# Patient Record
Sex: Male | Born: 1965
Health system: Southern US, Community
[De-identification: ages and names within clinical notes are randomized; demographics above are authoritative.]

## PROBLEM LIST (undated history)

## (undated) DIAGNOSIS — E7800 Pure hypercholesterolemia, unspecified: Secondary | ICD-10-CM

## (undated) DIAGNOSIS — I10 Essential (primary) hypertension: Secondary | ICD-10-CM

## (undated) DIAGNOSIS — S32009A Unspecified fracture of unspecified lumbar vertebra, initial encounter for closed fracture: Secondary | ICD-10-CM

## (undated) DIAGNOSIS — K219 Gastro-esophageal reflux disease without esophagitis: Secondary | ICD-10-CM

## (undated) DIAGNOSIS — J449 Chronic obstructive pulmonary disease, unspecified: Secondary | ICD-10-CM

## (undated) DIAGNOSIS — E119 Type 2 diabetes mellitus without complications: Secondary | ICD-10-CM

## (undated) HISTORY — DX: Type 2 diabetes mellitus without complications: E11.9

## (undated) HISTORY — DX: Essential (primary) hypertension: I10

## (undated) HISTORY — DX: Unspecified fracture of unspecified lumbar vertebra, initial encounter for closed fracture: S32.009A

---

## 1985-11-14 DIAGNOSIS — S32009A Unspecified fracture of unspecified lumbar vertebra, initial encounter for closed fracture: Secondary | ICD-10-CM

## 1985-11-14 HISTORY — DX: Unspecified fracture of unspecified lumbar vertebra, initial encounter for closed fracture: S32.009A

## 2019-01-30 ENCOUNTER — Encounter: Payer: Self-pay | Admitting: Pediatric Intensive Care

## 2019-01-30 ENCOUNTER — Encounter: Payer: Self-pay | Admitting: Critical Care Medicine

## 2019-01-31 NOTE — Progress Notes (Signed)
This is a 53 year old male who was seen in the [place] [facility] regarding questions around hypertension.  The patient's had hypertension for many years and currently is on lisinopril HCT 10/12.5 mg daily  The patient is followed at the [facility] [place] [facility] by Dr. Dartha Lodge.  Patient comes in today questioning his hypertension treatment program.  Patient complains of headache and some chest discomfort in the left upper chest that does not radiate.  He has some visual blurring.  There is no lower extremity edema.  He has mild dyspnea on exertion.  There is no previous history of diabetes or myocardial infarction.  The patient does have chronic low back pain after he fell out of a tree previously.  The patient smokes 1/2 pack a day of cigarettes  The patient also maintains to [facility] for 50/50 daily meloxicam 7.5 mg daily and methocarbamol 1000 mg 3 times daily as needed  On exam blood pressure is 182/106  Chest is clear cardiac exam shows an S for gallop with normal S1-S2 no S3 no murmur Abdomen was benign Extremities are perfused with no edema  Impression is that his hypertension poorly controlled the patient does have an appointment with his primary care provider later this week and was instructed to contact the provider today with an put regarding his blood pressure medicine control.  I explained to the patient we have in agreement with family services the [place] that we will not provide duplicate services for their patients and I did determine the patient was not having significant endorgan damage such that he needed emergency care.  We did advise smoking cessation counseling.  The Congregational nurse will check on the patient again in the morning

## 2019-02-01 ENCOUNTER — Other Ambulatory Visit: Payer: Self-pay | Admitting: Critical Care Medicine

## 2019-02-01 ENCOUNTER — Encounter: Payer: Self-pay | Admitting: Pediatric Intensive Care

## 2019-02-01 MED ORDER — LISINOPRIL-HYDROCHLOROTHIAZIDE 20-25 MG PO TABS: 1.0000 | ORAL_TABLET | Freq: Every day | ORAL | 3 refills | Status: DC

## 2019-02-01 MED ORDER — AMLODIPINE BESYLATE 10 MG PO TABS: 10.0000 mg | ORAL_TABLET | Freq: Every day | ORAL | 3 refills | Status: DC

## 2019-02-07 ENCOUNTER — Encounter: Payer: Self-pay | Admitting: Critical Care Medicine

## 2019-02-07 ENCOUNTER — Other Ambulatory Visit: Payer: Self-pay

## 2019-02-07 ENCOUNTER — Ambulatory Visit: Payer: Self-pay | Attending: Critical Care Medicine | Admitting: Critical Care Medicine

## 2019-02-07 VITALS — BP 122/74 | HR 96 | Temp 98.1°F | Ht 65.0 in | Wt 193.6 lb

## 2019-02-07 DIAGNOSIS — Z5900 Homelessness unspecified: Secondary | ICD-10-CM

## 2019-02-07 DIAGNOSIS — E119 Type 2 diabetes mellitus without complications: Secondary | ICD-10-CM

## 2019-02-07 DIAGNOSIS — I10 Essential (primary) hypertension: Secondary | ICD-10-CM

## 2019-02-07 DIAGNOSIS — F1011 Alcohol abuse, in remission: Secondary | ICD-10-CM

## 2019-02-07 DIAGNOSIS — Z720 Tobacco use: Secondary | ICD-10-CM

## 2019-02-07 DIAGNOSIS — Z114 Encounter for screening for human immunodeficiency virus [HIV]: Secondary | ICD-10-CM

## 2019-02-07 HISTORY — DX: Homelessness unspecified: Z59.00

## 2019-02-07 LAB — POCT URINALYSIS DIP (CLINITEK)
Bilirubin, UA: NEGATIVE
Glucose, UA: 500 mg/dL — AB
Ketones, POC UA: NEGATIVE mg/dL
Leukocytes, UA: NEGATIVE
NITRITE UA: NEGATIVE
PH UA: 5.5 (ref 5.0–8.0)
Spec Grav, UA: 1.015 (ref 1.010–1.025)
Urobilinogen, UA: 0.2 E.U./dL

## 2019-02-07 LAB — GLUCOSE, POCT (MANUAL RESULT ENTRY): POC Glucose: 227 mg/dl — AB (ref 70–99)

## 2019-02-07 MED ORDER — METFORMIN HCL 500 MG PO TABS: 500.0000 mg | ORAL_TABLET | Freq: Two times a day (BID) | ORAL | 1 refills | Status: DC

## 2019-02-07 MED ORDER — LISINOPRIL-HYDROCHLOROTHIAZIDE 20-25 MG PO TABS: 1.0000 | ORAL_TABLET | Freq: Every day | ORAL | 1 refills | Status: DC

## 2019-02-07 MED ORDER — TRUEPLUS LANCETS 28G MISC: 1 refills | Status: DC

## 2019-02-07 MED ORDER — TRUE METRIX METER W/DEVICE KIT: PACK | 0 refills | Status: DC

## 2019-02-07 MED ORDER — GLUCOSE BLOOD VI STRP: ORAL_STRIP | 1 refills | Status: DC

## 2019-02-07 MED ORDER — AMLODIPINE BESYLATE 10 MG PO TABS: 10.0000 mg | ORAL_TABLET | Freq: Every day | ORAL | 1 refills | Status: DC

## 2019-02-07 MED FILL — !TRUE METRIX BLOOD GLUCOSE: 1 days supply | Qty: 1 | Fill #0

## 2019-02-07 MED FILL — AMLODIPINE BESYLATE 10 MG T: 10 | 90 days supply | Qty: 90 | Fill #0

## 2019-02-07 MED FILL — TRUEplus LANCETS 28G MISC: 25 days supply | Qty: 100 | Fill #0

## 2019-02-07 MED FILL — LISINOPRIL-HCTZ 20-25 MG TA: 20-25 | 90 days supply | Qty: 90 | Fill #0

## 2019-02-07 MED FILL — TRUE METRIX GLUCOSE TEST ST: 25 days supply | Qty: 100 | Fill #0

## 2019-02-07 MED FILL — metFORMIN HCL 500 MG TABS: 500 | 30 days supply | Qty: 60 | Fill #0

## 2019-02-07 NOTE — Congregational Nurse Program (Signed)
  Dept: [phone]   Congregational Nurse Program Note  Date of Encounter: 01/30/2019  Past Medical History:Visit with DR Delford Field for eval. Client was restarted on prinivil recently by PCP via [facility]. Client has not been feeling well and wanted BP check. Follow up with CN on Friday. Shann Medal RN BSN CNP [phone] No past medical history on file.  Encounter Details:

## 2019-02-07 NOTE — Progress Notes (Signed)
Subjective:    Patient ID: Stanley Stone, male    DOB: Mar 05, 1966, 53 y.o.   MRN: [id number]  This is a 53 year old male who has had longstanding hypertension for 30 years and has not been taking medications for the least the past 8 years.  Also history of type 2 diabetes again not on medications for nearly a year.  The patient has had a history of alcohol abuse and has suffered homelessness and is now in a homeless shelter.  We initially saw the patient in the homeless shelter a few weeks ago and have referred the patient into the clinic for further evaluation.  We did begin amlodipine 10 mg daily and lisinopril HCT 20/25 mg daily 2 weeks ago and the patient comes in today on this medication program  Please review hypertension assessment below   Hypertension  This is a chronic problem. The current episode started more than 1 year ago. The problem has been rapidly worsening since onset. The problem is uncontrolled. Associated symptoms include anxiety, blurred vision, chest pain, headaches, malaise/fatigue, neck pain, orthopnea, PND, shortness of breath and sweats. Pertinent negatives include no palpitations or peripheral edema. Risk factors for coronary artery disease include diabetes mellitus, male gender, obesity, smoking/tobacco exposure, stress, family history and sedentary lifestyle. Compliance problems include diet, medication cost and psychosocial issues.  Hypertensive end-organ damage includes kidney disease. There is no history of angina, CAD/MI, CVA, heart failure, left ventricular hypertrophy, PVD or retinopathy. There is no history of a thyroid problem.   Past Medical History:  Diagnosis Date  . Closed fracture dislocation of lumbar spine ([place]) 1987  . Diabetes mellitus without complication ([place])   . Hypertension      History reviewed. No pertinent family history.   Social History   Socioeconomic History  . Marital status: Unknown    Spouse name: Not on file  . Number of  children: Not on file  . Years of education: Not on file  . Highest education level: Not on file  Occupational History  . Not on file  Social Needs  . Financial resource strain: Not on file  . Food insecurity:    Worry: Not on file    Inability: Not on file  . Transportation needs:    Medical: Not on file    Non-medical: Not on file  Tobacco Use  . Smoking status: Current Every Day Smoker  . Smokeless tobacco: Never Used  Substance and Sexual Activity  . Alcohol use: Not Currently  . Drug use: Not on file  . Sexual activity: Not on file  Lifestyle  . Physical activity:    Days per week: Not on file    Minutes per session: Not on file  . Stress: Not on file  Relationships  . Social connections:    Talks on phone: Not on file    Gets together: Not on file    Attends religious service: Not on file    Active member of club or organization: Not on file    Attends meetings of clubs or organizations: Not on file    Relationship status: Not on file  . Intimate partner violence:    Fear of current or ex partner: Not on file    Emotionally abused: Not on file    Physically abused: Not on file    Forced sexual activity: Not on file  Other Topics Concern  . Not on file  Social History Narrative  . Not on file  Allergies  Allergen Reactions  . Penicillins     Swell Up      Outpatient Medications Prior to Visit  Medication Sig Dispense Refill  . amLODipine (NORVASC) 10 MG tablet Take 1 tablet (10 mg total) by mouth daily. 30 tablet 3  . lisinopril-hydrochlorothiazide (PRINZIDE,ZESTORETIC) 20-25 MG tablet Take 1 tablet by mouth daily. 30 tablet 3   No facility-administered medications prior to visit.       Review of Systems  Constitutional: Positive for diaphoresis, fatigue and malaise/fatigue. Negative for chills and fever.  HENT: Negative for ear discharge, ear pain, sinus pressure, sinus pain, sneezing, sore throat and trouble swallowing.   Eyes: Positive for  blurred vision and visual disturbance.  Respiratory: Positive for cough and shortness of breath. Negative for chest tightness and wheezing.   Cardiovascular: Positive for chest pain, orthopnea and PND. Negative for palpitations.  Gastrointestinal: Negative.  Negative for blood in stool.  Genitourinary: Negative for difficulty urinating, genital sores, hematuria, penile pain and testicular pain.  Musculoskeletal: Positive for neck pain.  Neurological: Positive for headaches. Negative for dizziness, seizures, speech difficulty and weakness.       Objective:   Physical Exam Vitals:   02/07/19 1420  BP: 122/74  Pulse: 96  Temp: 98.1 F (36.7 C)  TempSrc: Oral  SpO2: 96%  Weight: 193 lb 9.6 oz (87.8 kg)  Height: '5\' 5"'$  (1.651 m)    Gen: Pleasant, well-nourished, in no distress,  normal affect  ENT: Poor dentition,  mouth clear,  oropharynx clear, no postnasal drip, Limited view of the retina shows no lesions  Neck: No JVD, no TMG, no carotid bruits  Lungs: No use of accessory muscles, no dullness to percussion, clear without rales or rhonchi  Cardiovascular: RRR, heart sounds normal, no murmur or gallops, no peripheral edema  Abdomen: soft and NT, no HSM,  BS normal  Musculoskeletal: No deformities, no cyanosis or clubbing  Neuro: alert, non focal  Skin: Warm, no lesions or rashes      Assessment & Plan:  I personally reviewed all images and lab data in the [facility] system as well as any outside material available during this office visit and agree with the  radiology impressions.   Homelessness History of homelessness currently now in a homeless shelter and not likely to achieve housing soon  HTN (hypertension) Poorly controlled hypertension secondary to lack of access to medications  Blood pressure is markedly better now on amlodipine and lisinopril HCT  We will obtain basic metabolic panel, lipid panel and thyroid profile.  We will also refill his current  antihypertensive medications  Tobacco use We did give some tobacco smoking cessation counseling at this visit  DM (diabetes mellitus), type 2 ([place]) Blood sugar today was 227 the patient does have a history of hyperglycemia  We will obtain a hemoglobin A1c and a urine microalbumin and as well begin this patient on metformin 500 mg twice daily   Shaun was seen today for follow-up.  Diagnoses and all orders for this visit:  Essential hypertension -     Comprehensive metabolic panel -     CBC with Differential/Platelet; Future -     Thyroid Profile  Type 2 diabetes mellitus without complication, without long-term current use of insulin (HCC) -     POCT URINALYSIS DIP (CLINITEK) -     Microalbumin, urine -     Comprehensive metabolic panel -     Hemoglobin A1c -     Lipid panel -  Glucose (CBG)  History of alcohol abuse  Encounter for screening for HIV -     HIV Antibody (routine testing w rflx)  Homelessness  Tobacco use  Other orders -     Discontinue: lisinopril-hydrochlorothiazide (PRINZIDE,ZESTORETIC) 20-25 MG tablet; Take 1 tablet by mouth daily. -     amLODipine (NORVASC) 10 MG tablet; Take 1 tablet (10 mg total) by mouth daily. -     metFORMIN (GLUCOPHAGE) 500 MG tablet; Take 1 tablet (500 mg total) by mouth 2 (two) times daily with a meal. -     Blood Glucose Monitoring Suppl (TRUE METRIX METER) w/Device KIT; Use as directed daily to check blood sugar -     glucose blood (TRUE METRIX BLOOD GLUCOSE TEST) test strip; Use as instructed -     TRUEplus Lancets 28G MISC; Check blood sugar daily -     lisinopril-hydrochlorothiazide (PRINZIDE,ZESTORETIC) 20-25 MG tablet; Take 1 tablet by mouth daily.

## 2019-02-07 NOTE — Congregational Nurse Program (Signed)
  Dept: [phone]   Congregational Nurse Program Note  Date of Encounter: 02/01/2019  Past Medical History: No past medical history on file.  Encounter Details:BP check.Cleitn took 2 prinivil this morning. CN will call DR Delford Field to advise of BP & meds. Shann Medal RN BSN CNP [phone]

## 2019-02-07 NOTE — Assessment & Plan Note (Signed)
Poorly controlled hypertension secondary to lack of access to medications  Blood pressure is markedly better now on amlodipine and lisinopril HCT  We will obtain basic metabolic panel, lipid panel and thyroid profile.  We will also refill his current antihypertensive medications

## 2019-02-07 NOTE — Patient Instructions (Signed)
Continue amlodipine daily and lisinopril HCT daily Begin metformin 500 mg twice daily  A glucose meter with testing supplies will also be sent to you to check your blood sugar at least once daily  All of your medications will be mailed to the [place] house address that you are staying  Labs today include a metabolic panel, blood count, HIV, thyroid and lipid panel and also hemoglobin A1c  I will do a telephone visit with you in the next 2 weeks to check on you, also the nurse at the shelter and check your blood pressure and follow-up

## 2019-02-07 NOTE — Assessment & Plan Note (Signed)
We did give some tobacco smoking cessation counseling at this visit

## 2019-02-07 NOTE — Assessment & Plan Note (Signed)
History of homelessness currently now in a homeless shelter and not likely to achieve housing soon

## 2019-02-07 NOTE — Assessment & Plan Note (Signed)
Blood sugar today was 227 the patient does have a history of hyperglycemia  We will obtain a hemoglobin A1c and a urine microalbumin and as well begin this patient on metformin 500 mg twice daily

## 2019-02-08 ENCOUNTER — Telehealth: Payer: Self-pay | Admitting: Critical Care Medicine

## 2019-02-08 ENCOUNTER — Telehealth: Payer: Self-pay | Admitting: Pediatric Intensive Care

## 2019-02-08 LAB — COMPREHENSIVE METABOLIC PANEL
ALT: 41 IU/L (ref 0–44)
AST: 20 IU/L (ref 0–40)
Albumin/Globulin Ratio: 1.7 (ref 1.2–2.2)
Albumin: 4.3 g/dL (ref 3.8–4.9)
Alkaline Phosphatase: 114 IU/L (ref 39–117)
BILIRUBIN TOTAL: 0.3 mg/dL (ref 0.0–1.2)
BUN/Creatinine Ratio: 20 (ref 9–20)
BUN: 23 mg/dL (ref 6–24)
CHLORIDE: 95 mmol/L — AB (ref 96–106)
CO2: 29 mmol/L (ref 20–29)
Calcium: 10.3 mg/dL — ABNORMAL HIGH (ref 8.7–10.2)
Creatinine, Ser: 1.17 mg/dL (ref 0.76–1.27)
GFR calc Af Amer: 82 mL/min/{1.73_m2} (ref >59)
GFR calc non Af Amer: 71 mL/min/{1.73_m2} (ref >59)
Globulin, Total: 2.5 g/dL (ref 1.5–4.5)
Glucose: 185 mg/dL — ABNORMAL HIGH (ref 65–99)
Potassium: 4.4 mmol/L (ref 3.5–5.2)
Sodium: 140 mmol/L (ref 134–144)
Total Protein: 6.8 g/dL (ref 6.0–8.5)

## 2019-02-08 LAB — HEMOGLOBIN A1C
Est. average glucose Bld gHb Est-mCnc: 177 mg/dL
Hgb A1c MFr Bld: 7.8 % — ABNORMAL HIGH (ref 4.8–5.6)

## 2019-02-08 LAB — THYROID PANEL
Free Thyroxine Index: 1.8 (ref 1.2–4.9)
T3 Uptake Ratio: 24 % (ref 24–39)
T4 TOTAL: 7.6 ug/dL (ref 4.5–12.0)

## 2019-02-08 LAB — LIPID PANEL
Chol/HDL Ratio: 7.2 ratio — ABNORMAL HIGH (ref 0.0–5.0)
Cholesterol, Total: 231 mg/dL — ABNORMAL HIGH (ref 100–199)
HDL: 32 mg/dL — ABNORMAL LOW (ref >39)
Triglycerides: 541 mg/dL — ABNORMAL HIGH (ref 0–149)

## 2019-02-08 LAB — MICROALBUMIN, URINE: Microalbumin, Urine: 183.7 ug/mL

## 2019-02-08 LAB — HIV ANTIBODY (ROUTINE TESTING W REFLEX): HIV Screen 4th Generation wRfx: NONREACTIVE

## 2019-02-08 MED ORDER — ATORVASTATIN CALCIUM 40 MG PO TABS: 40.0000 mg | ORAL_TABLET | Freq: Every day | ORAL | 3 refills | Status: DC

## 2019-02-08 MED FILL — ATORVASTATIN 40 MG TABLET: 40 | 90 days supply | Qty: 90 | Fill #0

## 2019-02-08 NOTE — Telephone Encounter (Signed)
Pt aware of results.  Needs to be on lipid therapy for hyperlipidemia.

## 2019-02-08 NOTE — Telephone Encounter (Signed)
Left HIPAA compliant message with shelter staff for client to return call. Shann Medal RN  BSN CNP [phone]

## 2019-02-12 ENCOUNTER — Encounter: Payer: Self-pay | Admitting: Pediatric Intensive Care

## 2019-02-13 NOTE — Congregational Nurse Program (Signed)
  Dept: [phone]   Congregational Nurse Program Note  Date of Encounter: 02/12/2019  Past Medical History: Past Medical History:  Diagnosis Date  . Closed fracture dislocation of lumbar spine (HCC) 1987  . Diabetes mellitus without complication (HCC)   . Hypertension     Encounter Details: BP/BG check. Cleint has not received meds that were mailed last week. CN will address. Shann Medal RN BSN CNP [phone]

## 2019-02-14 ENCOUNTER — Telehealth: Payer: Self-pay | Admitting: Pediatric Intensive Care

## 2019-02-14 NOTE — Telephone Encounter (Signed)
Call to client to see if he had received his medication by mail. He has not. CN will notify Durene Cal RN/CM at [facility], Shann Medal RN BSN CNP [phone]

## 2019-02-18 ENCOUNTER — Encounter: Payer: Self-pay | Admitting: Critical Care Medicine

## 2019-02-18 ENCOUNTER — Other Ambulatory Visit: Payer: Self-pay | Admitting: Critical Care Medicine

## 2019-02-18 MED ORDER — TRUEPLUS LANCETS 28G MISC: 1 refills | Status: DC

## 2019-02-18 MED ORDER — TRUE METRIX METER W/DEVICE KIT: PACK | 0 refills | Status: DC

## 2019-02-18 MED ORDER — GLUCOSE BLOOD VI STRP: ORAL_STRIP | 1 refills | Status: DC

## 2019-02-18 MED ORDER — METFORMIN HCL 500 MG PO TABS: 500.0000 mg | ORAL_TABLET | Freq: Two times a day (BID) | ORAL | 1 refills | Status: DC

## 2019-02-18 MED ORDER — AMLODIPINE BESYLATE 10 MG PO TABS: 10.0000 mg | ORAL_TABLET | Freq: Every day | ORAL | 1 refills | Status: DC

## 2019-02-18 MED ORDER — ATORVASTATIN CALCIUM 40 MG PO TABS: 40.0000 mg | ORAL_TABLET | Freq: Every day | ORAL | 3 refills | Status: DC

## 2019-02-18 MED ORDER — LISINOPRIL-HYDROCHLOROTHIAZIDE 20-25 MG PO TABS: 1.0000 | ORAL_TABLET | Freq: Every day | ORAL | 1 refills | Status: DC

## 2019-02-18 MED FILL — AMLODIPINE BESYLATE 10 MG T: 10 | 30 days supply | Qty: 30 | Fill #0

## 2019-02-18 MED FILL — TRUE METRIX TEST STRIP: 30 days supply | Qty: 100 | Fill #0

## 2019-02-18 MED FILL — !TRUE METRIX BLOOD GLUCOSE: 30 days supply | Qty: 1 | Fill #0

## 2019-02-18 MED FILL — ATORVASTATIN 40 MG TABLET: 40 | 30 days supply | Qty: 30 | Fill #0

## 2019-02-18 MED FILL — TRUEplus LANCETS 28G MISC: 30 days supply | Qty: 100 | Fill #0

## 2019-02-18 MED FILL — LISINOPRIL-HCTZ 20-25 MG TA: 20-25 | 30 days supply | Qty: 30 | Fill #0

## 2019-02-18 MED FILL — metFORMIN HCL 500 MG TABS: 500 | 30 days supply | Qty: 60 | Fill #0

## 2022-01-12 ENCOUNTER — Other Ambulatory Visit: Payer: Self-pay | Admitting: Critical Care Medicine

## 2022-01-12 ENCOUNTER — Other Ambulatory Visit (HOSPITAL_COMMUNITY): Payer: Self-pay

## 2022-01-12 ENCOUNTER — Other Ambulatory Visit: Payer: Self-pay | Admitting: Pediatric Intensive Care

## 2022-01-12 ENCOUNTER — Encounter: Payer: Self-pay | Admitting: Pediatric Intensive Care

## 2022-01-12 DIAGNOSIS — Z139 Encounter for screening, unspecified: Secondary | ICD-10-CM

## 2022-01-12 LAB — GLUCOSE, POCT (MANUAL RESULT ENTRY): POC Glucose: 239 mg/dl — AB (ref 70–99)

## 2022-01-12 MED ORDER — ATORVASTATIN CALCIUM 40 MG PO TABS
40.0000 mg | ORAL_TABLET | Freq: Every day | ORAL | 1 refills | Status: DC
  Filled 2022-01-12: qty 30, 30d supply, fill #0
  Filled 2022-02-15: qty 30, 30d supply, fill #1
  Filled 2022-03-17: qty 30, 30d supply, fill #2

## 2022-01-12 MED ORDER — AMLODIPINE BESYLATE 10 MG PO TABS
10.0000 mg | ORAL_TABLET | Freq: Every day | ORAL | 1 refills | Status: DC
  Filled 2022-01-12: qty 30, 30d supply, fill #0
  Filled 2022-02-15: qty 30, 30d supply, fill #1
  Filled 2022-03-17: qty 30, 30d supply, fill #2

## 2022-01-12 MED ORDER — METFORMIN HCL 500 MG PO TABS
500.0000 mg | ORAL_TABLET | Freq: Two times a day (BID) | ORAL | 1 refills | Status: DC
  Filled 2022-01-12: qty 60, 30d supply, fill #0
  Filled 2022-02-15: qty 60, 30d supply, fill #1
  Filled 2022-03-17: qty 60, 30d supply, fill #2

## 2022-01-12 MED ORDER — LISINOPRIL-HYDROCHLOROTHIAZIDE 20-25 MG PO TABS
1.0000 | ORAL_TABLET | Freq: Every day | ORAL | 1 refills | Status: DC
  Filled 2022-01-12: qty 30, 30d supply, fill #0
  Filled 2022-02-15: qty 30, 30d supply, fill #1
  Filled 2022-03-17: qty 30, 30d supply, fill #2

## 2022-01-12 NOTE — Progress Notes (Signed)
See CCNP note ?

## 2022-01-12 NOTE — Progress Notes (Signed)
Request for medication bridging until client can come next week to see me ?

## 2022-01-12 NOTE — Congregational Nurse Program (Signed)
?  Dept: [phone] ? ? ?Congregational Nurse Program Note ? ?Date of Encounter: 01/12/2022 ? ?Past Medical History: ?Past Medical History:  ?Diagnosis Date  ? Closed fracture dislocation of lumbar spine (HCC) 1987  ? Diabetes mellitus without complication (HCC)   ? Hypertension   ? ? ?Encounter Details:[facility]. Client referred to Dr Delford Field for medication assistance. Appointment made for Tuesday at [facility] at 1045. [employer] to follow up with client at appointment on Tuesday. CN N refer client to [facility] in [place].  ?Jenna Luo RN CCNP ?[phone]. ? ? ? ? ?

## 2022-01-16 NOTE — Progress Notes (Addendum)
New Patient Office Visit  Subjective:  Patient ID: Stanley Stone, male    DOB: 1966/06/03  Age: 56 y.o. MRN: [id number]  CC:  Chief Complaint  Patient presents with   Establish Care   Hypertension   Diabetes    HPI Stanley Stone presents for to establish care.  I have not seen this patient since March 2020.  This patient is homeless he lives in an abandoned house in [place].  There is no electricity or heat.  He is sleeping on the floor.  Patient has severe hypertension and type 2 diabetes.  On arrival blood pressure 180/105.  He was given prescription last week for Zestoretic along with metformin and atorvastatin.  Patient's been having significant dizziness emesis epigastric abdominal pain some blood intermixed in the emesis.  He drinks about 1 beer a day.  If he stops drinking beer he feels he might go into delirium tremens.  He has a history of COPD and does have a chronic cough with some wheezing.  On arrival A1c is 9.1 blood sugar is 232.  He does not have a meter currently to measure his blood sugar.  He eats a lot of carbohydrates because he gets food from charts of food pantry's that have largely carbohydrate-based meals.  Patient does note shortness of breath with exertion.  He does not have any rectal bleeding.  He notes some numbness in the hands and in the feet.     Past Medical History:  Diagnosis Date   Closed fracture dislocation of lumbar spine ([place]) 1987   Diabetes mellitus without complication ([place])    Hypertension     History reviewed. No pertinent surgical history.  History reviewed. No pertinent family history.  Social History   Socioeconomic History   Marital status: Unknown    Spouse name: Not on file   Number of children: Not on file   Years of education: Not on file   Highest education level: Not on file  Occupational History   Not on file  Tobacco Use   Smoking status: Every Day   Smokeless tobacco: Never  Vaping Use   Vaping Use: Never  used  Substance and Sexual Activity   Alcohol use: Not Currently   Drug use: Not on file   Sexual activity: Not on file  Other Topics Concern   Not on file  Social History Narrative   Not on file   Social Determinants of Health   Financial Resource Strain: Not on file  Food Insecurity: Not on file  Transportation Needs: Not on file  Physical Activity: Not on file  Stress: Not on file  Social Connections: Not on file  Intimate Partner Violence: Not on file    ROS Review of Systems  Constitutional:  Positive for fatigue. Negative for fever.  HENT:  Positive for tinnitus. Negative for congestion, drooling, ear pain, facial swelling, postnasal drip, rhinorrhea, sinus pressure, sneezing, sore throat, trouble swallowing and voice change.   Eyes:  Positive for visual disturbance.  Respiratory:  Positive for cough, choking, shortness of breath and wheezing. Negative for apnea, chest tightness and stridor.   Cardiovascular: Negative.  Negative for chest pain, palpitations and leg swelling.  Gastrointestinal:  Positive for abdominal pain, nausea and vomiting. Negative for abdominal distention, anal bleeding, blood in stool, constipation, diarrhea and rectal pain.  Endocrine: Positive for polydipsia, polyphagia and polyuria.  Genitourinary: Negative.   Musculoskeletal: Negative.  Negative for arthralgias and myalgias.       Hip  pain  Skin: Negative.  Negative for rash.  Allergic/Immunologic: Negative.  Negative for environmental allergies and food allergies.  Neurological:  Positive for dizziness, light-headedness and headaches. Negative for syncope and weakness.  Hematological: Negative.  Negative for adenopathy. Does not bruise/bleed easily.  Psychiatric/Behavioral: Negative.  Negative for agitation, decreased concentration, dysphoric mood, hallucinations, self-injury, sleep disturbance and suicidal ideas. The patient is not nervous/anxious and is not hyperactive.    Objective:    Today's Vitals: BP (!) 180/105    Pulse 96    Ht _0  (1.651 m)    Wt 178 lb (80.7 kg)    SpO2 95%    BMI 29.62 kg/m   Physical Exam Vitals reviewed.  Constitutional:      Appearance: Normal appearance. He is well-developed. He is obese. He is not diaphoretic.  HENT:     Head: Normocephalic and atraumatic.     Nose: Nose normal. No nasal deformity, septal deviation, mucosal edema or rhinorrhea.     Right Sinus: No maxillary sinus tenderness or frontal sinus tenderness.     Left Sinus: No maxillary sinus tenderness or frontal sinus tenderness.     Mouth/Throat:     Mouth: Mucous membranes are moist.     Pharynx: Oropharynx is clear. No oropharyngeal exudate.     Comments: Poor dentition Eyes:     General: No scleral icterus.    Conjunctiva/sclera: Conjunctivae normal.     Pupils: Pupils are equal, round, and reactive to light.  Neck:     Thyroid: No thyromegaly.     Vascular: No carotid bruit or JVD.     Trachea: Trachea normal. No tracheal tenderness or tracheal deviation.  Cardiovascular:     Rate and Rhythm: Normal rate and regular rhythm.     Chest Wall: PMI is not displaced.     Pulses: Normal pulses. No decreased pulses.     Heart sounds: Normal heart sounds, S1 normal and S2 normal. Heart sounds not distant. No murmur heard. No systolic murmur is present.  No diastolic murmur is present.    No friction rub. No gallop. No S3 or S4 sounds.  Pulmonary:     Effort: Pulmonary effort is normal. No tachypnea, accessory muscle usage or respiratory distress.     Breath sounds: No stridor. Wheezing present. No decreased breath sounds, rhonchi or rales.  Chest:     Chest wall: No tenderness.  Abdominal:     General: Bowel sounds are normal. There is no distension.     Palpations: Abdomen is soft. Abdomen is not rigid. There is no mass.     Tenderness: There is abdominal tenderness. There is no right CVA tenderness, left CVA tenderness, guarding or rebound.     Hernia: No  hernia is present.     Comments: Mild epigastric tenderness  Musculoskeletal:        General: Normal range of motion.     Cervical back: Normal range of motion and neck supple. No edema, erythema or rigidity. No muscular tenderness. Normal range of motion.     Comments: Patient has severe onychomycosis all toenails and severe callus formation in the feet  Lymphadenopathy:     Head:     Right side of head: No submental or submandibular adenopathy.     Left side of head: No submental or submandibular adenopathy.     Cervical: No cervical adenopathy.  Skin:    General: Skin is warm and dry.     Coloration: Skin is not pale.  Findings: No rash.     Nails: There is no clubbing.  Neurological:     Mental Status: He is alert and oriented to person, place, and time.     Sensory: No sensory deficit.  Psychiatric:        Speech: Speech normal.        Behavior: Behavior normal.    Assessment & Plan:   Problem List Items Addressed This Visit       Cardiovascular and Mediastinum   HTN (hypertension)    Hypertension poorly controlled  We will add carvedilol 25 mg twice daily continue amlodipine and Zestoretic as prescribed  Urinalysis did not show protein  Obtain metabolic panel and blood counts thyroid function      Relevant Medications   carvedilol (COREG) 25 MG tablet   Other Relevant Orders   Comprehensive metabolic panel   CBC with Differential/Platelet     Respiratory   COPD with chronic bronchitis (HCC)    Plan to start Advair 2 inhalations twice daily 115 mcg strength and albuterol as needed      Relevant Medications   albuterol (VENTOLIN HFA) 108 (90 Base) MCG/ACT inhaler   fluticasone-salmeterol (ADVAIR HFA) 115-21 MCG/ACT inhaler     Endocrine   DM (diabetes mellitus), type 2 ([place]) - Primary    Uncontrolled diabetes despite metformin  Ketones negative and urine  Begin insulin glargine 10 units daily I instructed patient as to proper use of the  KwikPen  Continue metformin as prescribed  Check lipid panel      Relevant Medications   Insulin Glargine (BASAGLAR KWIKPEN) 100 UNIT/ML   Other Relevant Orders   HgB A1c (Completed)   Comprehensive metabolic panel   Lipid panel   POCT glucose (manual entry) (Completed)   Microalbumin / creatinine urine ratio   POCT URINALYSIS DIP (CLINITEK) (Completed)     Musculoskeletal and Integument   Onychomycosis    We will try to get this patient process for the orange card as soon as possible he needs a foot exam with podiatry        Other   History of alcohol abuse    Now down to 1 beer daily      Homelessness    We connected the patient with our clinical case manager who will contact him with [place]      Tobacco use       Current smoking consumption amount: Half a pack a day  Dicsussion on advise to quit smoking and smoking impacts: Lung impacts  Patient's willingness to quit: Not ready to quit  Methods to quit smoking discussed: Behavioral modification  Medication management of smoking session drugs discussed: Nicotine replacement Resources provided:  AVS   Setting quit date not established  Follow-up arranged 2 weeks   Time spent counseling the patient: 3 minutes       Other Visit Diagnoses     Need for hepatitis C screening test       Relevant Orders   HCV Ab w Reflex to Quant PCR   Hyperlipidemia associated with type 2 diabetes mellitus (HCC)       Relevant Medications   Insulin Glargine (BASAGLAR KWIKPEN) 100 UNIT/ML   carvedilol (COREG) 25 MG tablet   Other Relevant Orders   Lipid panel   Epigastric pain       Relevant Orders   Lipase       Outpatient Encounter Medications as of 01/18/2022  Medication Sig   albuterol (VENTOLIN HFA) 108 (  90 Base) MCG/ACT inhaler Inhale 2 puffs into the lungs every 6 (six) hours as needed for wheezing or shortness of breath.   carvedilol (COREG) 25 MG tablet Take 1 tablet (25 mg total) by mouth 2  (two) times daily with a meal.   fluticasone-salmeterol (ADVAIR HFA) 115-21 MCG/ACT inhaler Inhale 2 puffs into the lungs 2 (two) times daily.   Insulin Glargine (BASAGLAR KWIKPEN) 100 UNIT/ML Inject 10 Units into the skin daily.   Insulin Pen Needle 29G X 12.7MM MISC use with insulin pen   pantoprazole (PROTONIX) 40 MG tablet Take 1 tablet (40 mg total) by mouth daily.   [DISCONTINUED] budesonide-formoterol (SYMBICORT) 80-4.5 MCG/ACT inhaler Inhale 2 puffs into the lungs 2 (two) times daily.   amLODipine (NORVASC) 10 MG tablet Take 1 tablet (10 mg total) by mouth daily.   atorvastatin (LIPITOR) 40 MG tablet Take 1 tablet (40 mg total) by mouth daily.   Blood Glucose Monitoring Suppl (TRUE METRIX METER) w/Device KIT Use as directed daily to check blood sugar   glucose blood (TRUE METRIX BLOOD GLUCOSE TEST) test strip Use as instructed   lisinopril-hydrochlorothiazide (ZESTORETIC) 20-25 MG tablet Take 1 tablet by mouth daily.   metFORMIN (GLUCOPHAGE) 500 MG tablet Take 1 tablet (500 mg total) by mouth 2 (two) times daily with a meal.   TRUEplus Lancets 28G MISC Check blood sugar daily   [DISCONTINUED] Blood Glucose Monitoring Suppl (TRUE METRIX METER) w/Device KIT Use as directed daily to check blood sugar   [DISCONTINUED] glucose blood (TRUE METRIX BLOOD GLUCOSE TEST) test strip Use as instructed   [DISCONTINUED] TRUEplus Lancets 28G MISC Check blood sugar daily   No facility-administered encounter medications on file as of 01/18/2022.   Have the patient return for the mobile medicine unit in 2 weeks Patient declined all vaccines Follow-up: Return in about 16 days (around 02/03/2022) for [facility] in [place] at mobile medicine unit.   Asencion Noble, MD

## 2022-01-18 ENCOUNTER — Telehealth: Payer: Self-pay

## 2022-01-18 ENCOUNTER — Other Ambulatory Visit: Payer: Self-pay

## 2022-01-18 ENCOUNTER — Ambulatory Visit: Payer: Self-pay | Attending: Critical Care Medicine | Admitting: Critical Care Medicine

## 2022-01-18 ENCOUNTER — Encounter: Payer: Self-pay | Admitting: Critical Care Medicine

## 2022-01-18 VITALS — BP 180/105 | HR 96 | Ht 65.0 in | Wt 178.0 lb

## 2022-01-18 DIAGNOSIS — J449 Chronic obstructive pulmonary disease, unspecified: Secondary | ICD-10-CM

## 2022-01-18 DIAGNOSIS — R1013 Epigastric pain: Secondary | ICD-10-CM

## 2022-01-18 DIAGNOSIS — E785 Hyperlipidemia, unspecified: Secondary | ICD-10-CM

## 2022-01-18 DIAGNOSIS — B351 Tinea unguium: Secondary | ICD-10-CM

## 2022-01-18 DIAGNOSIS — F1011 Alcohol abuse, in remission: Secondary | ICD-10-CM

## 2022-01-18 DIAGNOSIS — J42 Unspecified chronic bronchitis: Secondary | ICD-10-CM | POA: Insufficient documentation

## 2022-01-18 DIAGNOSIS — E1169 Type 2 diabetes mellitus with other specified complication: Secondary | ICD-10-CM

## 2022-01-18 DIAGNOSIS — Z1159 Encounter for screening for other viral diseases: Secondary | ICD-10-CM

## 2022-01-18 DIAGNOSIS — I10 Essential (primary) hypertension: Secondary | ICD-10-CM

## 2022-01-18 DIAGNOSIS — Z5900 Homelessness unspecified: Secondary | ICD-10-CM

## 2022-01-18 DIAGNOSIS — Z720 Tobacco use: Secondary | ICD-10-CM

## 2022-01-18 DIAGNOSIS — E119 Type 2 diabetes mellitus without complications: Secondary | ICD-10-CM

## 2022-01-18 LAB — POCT GLYCOSYLATED HEMOGLOBIN (HGB A1C): HbA1c, POC (controlled diabetic range): 9.1 % — AB (ref 0.0–7.0)

## 2022-01-18 LAB — POCT URINALYSIS DIP (CLINITEK)
Bilirubin, UA: NEGATIVE
Glucose, UA: 500 mg/dL — AB
Ketones, POC UA: NEGATIVE mg/dL
Leukocytes, UA: NEGATIVE
Nitrite, UA: NEGATIVE
POC PROTEIN,UA: 100 — AB
Spec Grav, UA: 1.025 (ref 1.010–1.025)
Urobilinogen, UA: 0.2 E.U./dL
pH, UA: 6 (ref 5.0–8.0)

## 2022-01-18 LAB — GLUCOSE, POCT (MANUAL RESULT ENTRY): POC Glucose: 232 mg/dl — AB (ref 70–99)

## 2022-01-18 MED ORDER — TRUE METRIX BLOOD GLUCOSE TEST VI STRP
ORAL_STRIP | 1 refills | Status: DC
  Filled 2022-01-18 – 2022-02-15 (×2): qty 100, 25d supply, fill #0

## 2022-01-18 MED ORDER — TRUEPLUS LANCETS 28G MISC
1 refills | Status: DC
  Filled 2022-01-18 – 2022-02-15 (×2): qty 100, 25d supply, fill #0

## 2022-01-18 MED ORDER — ADVAIR HFA 115-21 MCG/ACT IN AERO
2.0000 | INHALATION_SPRAY | Freq: Two times a day (BID) | RESPIRATORY_TRACT | 12 refills | Status: DC
  Filled 2022-01-18 – 2022-02-15 (×2): qty 12, 30d supply, fill #0

## 2022-01-18 MED ORDER — ALBUTEROL SULFATE HFA 108 (90 BASE) MCG/ACT IN AERS
2.0000 | INHALATION_SPRAY | Freq: Four times a day (QID) | RESPIRATORY_TRACT | 0 refills | Status: DC | PRN
  Filled 2022-01-18: qty 18, 25d supply, fill #0

## 2022-01-18 MED ORDER — BUDESONIDE-FORMOTEROL FUMARATE 80-4.5 MCG/ACT IN AERO
2.0000 | INHALATION_SPRAY | Freq: Two times a day (BID) | RESPIRATORY_TRACT | 3 refills | Status: DC
Start: 2022-01-18 — End: 2022-01-18
  Filled 2022-01-18 (×2): qty 10.2, 30d supply, fill #0

## 2022-01-18 MED ORDER — PANTOPRAZOLE SODIUM 40 MG PO TBEC
40.0000 mg | DELAYED_RELEASE_TABLET | Freq: Every day | ORAL | 3 refills | Status: DC
  Filled 2022-01-18 – 2022-02-15 (×2): qty 30, 30d supply, fill #0

## 2022-01-18 MED ORDER — CARVEDILOL 25 MG PO TABS
25.0000 mg | ORAL_TABLET | Freq: Two times a day (BID) | ORAL | 3 refills | Status: DC
  Filled 2022-01-18 – 2022-02-15 (×2): qty 60, 30d supply, fill #0
  Filled 2022-03-17: qty 60, 30d supply, fill #1

## 2022-01-18 MED ORDER — BASAGLAR KWIKPEN 100 UNIT/ML ~~LOC~~ SOPN
10.0000 [IU] | PEN_INJECTOR | Freq: Every day | SUBCUTANEOUS | 2 refills | Status: DC
  Filled 2022-01-18 – 2022-02-15 (×2): qty 3, 30d supply, fill #0

## 2022-01-18 MED ORDER — INSULIN PEN NEEDLE 29G X 12.7MM MISC
2 refills | Status: DC
  Filled 2022-01-18 – 2022-02-15 (×2): qty 100, 30d supply, fill #0

## 2022-01-18 MED ORDER — TRUE METRIX METER W/DEVICE KIT
PACK | 0 refills | Status: DC
  Filled 2022-01-18: qty 1, 30d supply, fill #0

## 2022-01-18 NOTE — Telephone Encounter (Signed)
Met with the patient when he was in the clinic today. He is currently living in an abandoned building with no heat/electricity.  He explained that he is working with the case Freight forwarder at [employer] in [place] to obtain housing. He thinks he may have a housing voucher but he has no income. He was in agreement to submitting a referral to [facility] for assistance with applying for disability.  ?He stated that he has applied for Medicaid in the past and was denied He has not appealed or re-applied.  ?Lisette Abu, RN , CNP arranged his transportation to/from the clinic appointment this morning  ? ?Disability Assistance Referral form faxed to [facility].  ?

## 2022-01-18 NOTE — Assessment & Plan Note (Signed)
? ? ??   Current smoking consumption amount: Half a pack a day ? ?? Dicsussion on advise to quit smoking and smoking impacts: Lung impacts ? ?? Patient's willingness to quit: Not ready to quit ? ?? Methods to quit smoking discussed: Behavioral modification ? ?? Medication management of smoking session drugs discussed: ?Nicotine replacement ?? Resources provided:  AVS  ? ?? Setting quit date not established ? ?? Follow-up arranged 2 weeks ? ? ?Time spent counseling the patient: 3 minutes ? ?

## 2022-01-18 NOTE — Assessment & Plan Note (Signed)
Hypertension poorly controlled ? ?We will add carvedilol 25 mg twice daily continue amlodipine and Zestoretic as prescribed ? ?Urinalysis did not show protein ? ?Obtain metabolic panel and blood counts thyroid function ?

## 2022-01-18 NOTE — Assessment & Plan Note (Signed)
Uncontrolled diabetes despite metformin ? ?Ketones negative and urine ? ?Begin insulin glargine 10 units daily I instructed patient as to proper use of the KwikPen ? ?Continue metformin as prescribed ? ?Check lipid panel ?

## 2022-01-18 NOTE — Assessment & Plan Note (Signed)
We will try to get this patient process for the orange card as soon as possible he needs a foot exam with podiatry ?

## 2022-01-18 NOTE — Patient Instructions (Addendum)
Begin insulin Basaglar 10 units daily ? ?Begin carvedilol twice daily for blood pressure ? ?Begin Symbicort 2 puffs twice daily for your breathing and albuterol as needed ? ?Begin pantoprazole daily for acid buildup ? ?Try to limit your alcohol intake ? ?I will communicate your results with Ms. Red Christians who will contact you and also work with her to get your orange card so we can get you to the foot doctor ? ?Return to see Dr. Joya Gaskins March 23 at the [facility] in [place] we will let Ms. Red Christians know that location ? ?I will be on the mobile medicine bus unit that day we are open from 9 AM to 4 PM its walk-in walk up service I will be on the look out for you ? ? ?

## 2022-01-18 NOTE — Assessment & Plan Note (Signed)
Plan to start Advair 2 inhalations twice daily 115 mcg strength and albuterol as needed ?

## 2022-01-18 NOTE — Assessment & Plan Note (Signed)
We connected the patient with our clinical case manager who will contact him with [facility] ?

## 2022-01-18 NOTE — Assessment & Plan Note (Signed)
Now down to 1 beer daily ?

## 2022-01-19 ENCOUNTER — Other Ambulatory Visit: Payer: Self-pay

## 2022-01-19 LAB — CBC WITH DIFFERENTIAL/PLATELET
Basophils Absolute: 0 10*3/uL (ref 0.0–0.2)
Basos: 0 %
EOS (ABSOLUTE): 0.1 10*3/uL (ref 0.0–0.4)
Eos: 1 %
Hematocrit: 48.5 % (ref 37.5–51.0)
Hemoglobin: 16.6 g/dL (ref 13.0–17.7)
Immature Grans (Abs): 0 10*3/uL (ref 0.0–0.1)
Immature Granulocytes: 0 %
Lymphocytes Absolute: 2 10*3/uL (ref 0.7–3.1)
Lymphs: 21 %
MCH: 30.4 pg (ref 26.6–33.0)
MCHC: 34.2 g/dL (ref 31.5–35.7)
MCV: 89 fL (ref 79–97)
Monocytes Absolute: 0.8 10*3/uL (ref 0.1–0.9)
Monocytes: 8 %
Neutrophils Absolute: 6.6 10*3/uL (ref 1.4–7.0)
Neutrophils: 70 %
Platelets: 272 10*3/uL (ref 150–450)
RBC: 5.46 x10E6/uL (ref 4.14–5.80)
RDW: 12.7 % (ref 11.6–15.4)
WBC: 9.5 10*3/uL (ref 3.4–10.8)

## 2022-01-19 LAB — COMPREHENSIVE METABOLIC PANEL
ALT: 19 IU/L (ref 0–44)
AST: 18 IU/L (ref 0–40)
Albumin/Globulin Ratio: 1.7 (ref 1.2–2.2)
Albumin: 4.7 g/dL (ref 3.8–4.9)
Alkaline Phosphatase: 111 IU/L (ref 44–121)
BUN/Creatinine Ratio: 19 (ref 9–20)
BUN: 22 mg/dL (ref 6–24)
Bilirubin Total: 0.7 mg/dL (ref 0.0–1.2)
CO2: 28 mmol/L (ref 20–29)
Calcium: 10.2 mg/dL (ref 8.7–10.2)
Chloride: 91 mmol/L — ABNORMAL LOW (ref 96–106)
Creatinine, Ser: 1.14 mg/dL (ref 0.76–1.27)
Globulin, Total: 2.7 g/dL (ref 1.5–4.5)
Glucose: 220 mg/dL — ABNORMAL HIGH (ref 70–99)
Potassium: 4.7 mmol/L (ref 3.5–5.2)
Sodium: 134 mmol/L (ref 134–144)
Total Protein: 7.4 g/dL (ref 6.0–8.5)
eGFR: 75 mL/min/{1.73_m2} (ref >59)

## 2022-01-19 LAB — LIPID PANEL
Chol/HDL Ratio: 5.2 ratio — ABNORMAL HIGH (ref 0.0–5.0)
Cholesterol, Total: 241 mg/dL — ABNORMAL HIGH (ref 100–199)
HDL: 46 mg/dL (ref >39)
LDL Chol Calc (NIH): 119 mg/dL — ABNORMAL HIGH (ref 0–99)
Triglycerides: 432 mg/dL — ABNORMAL HIGH (ref 0–149)
VLDL Cholesterol Cal: 76 mg/dL — ABNORMAL HIGH (ref 5–40)

## 2022-01-19 LAB — HCV AB W REFLEX TO QUANT PCR: HCV Ab: NONREACTIVE

## 2022-01-19 LAB — MICROALBUMIN / CREATININE URINE RATIO
Creatinine, Urine: 58.4 mg/dL
Microalb/Creat Ratio: 1037 mg/g creat — ABNORMAL HIGH (ref 0–29)
Microalbumin, Urine: 605.4 ug/mL

## 2022-01-19 LAB — HCV INTERPRETATION

## 2022-01-20 LAB — SPECIMEN STATUS REPORT

## 2022-01-20 LAB — LIPASE: Lipase: 32 U/L (ref 13–78)

## 2022-02-02 NOTE — Progress Notes (Incomplete)
? ?New Patient Office Visit ? ?Subjective:  ?Patient ID: Stanley BertholdChristopher Welliver, male    DOB: 10/13/1966  Age: 56 y.o. MRN: [id number] ? ?CC:  ?No chief complaint on file. ? ? ?HPI ?Stanley Stone presents for to establish care.  I have not seen this patient since March 2020.  This patient is homeless he lives in an abandoned house in [place].  There is no electricity or heat.  He is sleeping on the floor.  Patient has severe hypertension and type 2 diabetes.  On arrival blood pressure 180/105.  He was given prescription last week for Zestoretic along with metformin and atorvastatin.  Patient's been having significant dizziness emesis epigastric abdominal pain some blood intermixed in the emesis.  He drinks about 1 beer a day.  If he stops drinking beer he feels he might go into delirium tremens.  He has a history of COPD and does have a chronic cough with some wheezing.  On arrival A1c is 9.1 blood sugar is 232.  He does not have a meter currently to measure his blood sugar.  He eats a lot of carbohydrates because he gets food from charts of food pantry's that have largely carbohydrate-based meals.  Patient does note shortness of breath with exertion.  He does not have any rectal bleeding.  He notes some numbness in the hands and in the feet. ? ?02/03/22 ? HTN (hypertension)  ?  Hypertension poorly controlled ? ?We will add carvedilol 25 mg twice daily continue amlodipine and Zestoretic as prescribed ? ?Urinalysis did not show protein ? ?Obtain metabolic panel and blood counts thyroid function ?  ?  ? Relevant Medications  ? carvedilol (COREG) 25 MG tablet  ? Other Relevant Orders  ? Comprehensive metabolic panel  ? CBC with Differential/Platelet  ?  ? Respiratory  ? COPD with chronic bronchitis (HCC)  ?  Plan to start Advair 2 inhalations twice daily 115 mcg strength and albuterol as needed ?  ?  ? Relevant Medications  ? albuterol (VENTOLIN HFA) 108 (90 Base) MCG/ACT inhaler  ? fluticasone-salmeterol (ADVAIR HFA)  115-21 MCG/ACT inhaler  ?  ? Endocrine  ? DM (diabetes mellitus), type 2 (HCC) - Primary  ?  Uncontrolled diabetes despite metformin ? ?Ketones negative and urine ? ?Begin insulin glargine 10 units daily I instructed patient as to proper use of the KwikPen ? ?Continue metformin as prescribed ? ?Check lipid panel ?  ?  ? Relevant Medications  ? Insulin Glargine (BASAGLAR KWIKPEN) 100 UNIT/ML  ? Other Relevant Orders  ? HgB A1c (Completed)  ? Comprehensive metabolic panel  ? Lipid panel  ? POCT glucose (manual entry) (Completed)  ? Microalbumin / creatinine urine ratio  ? POCT URINALYSIS DIP (CLINITEK) (Completed)  ?  ? Musculoskeletal and Integument  ? Onychomycosis  ?  We will try to get this patient process for the orange card as soon as possible he needs a foot exam with podiatry ?  ?  ?  ? Other  ? History of alcohol abuse  ?  Now down to 1 beer daily ?  ?  ? Homelessness  ?  We connected the patient with our clinical case manager who will contact him with [facility] ?  ?  ? Tobacco use  ?   ? ? ?Current smoking consumption amount: Half a pack a day ? ?Dicsussion on advise to quit smoking and smoking impacts: Lung impacts ? ?Patient's willingness to quit: Not ready to quit ? ?Methods to  quit smoking discussed: Behavioral modification ? ?Medication management of smoking session drugs discussed: ?Nicotine replacement ?Resources provided:  AVS  ? ?Setting quit date not established ? ?Follow-up arranged 2 weeks ? ? ?Time spent counseling the patient: 3 minutes ? ?  ?  ? ?Other Visit Diagnoses   ? ? Need for hepatitis C screening test      ? Relevant Orders  ? HCV Ab w Reflex to Quant PCR  ? Hyperlipidemia associated with type 2 diabetes mellitus (HCC)      ? Relevant Medications  ? Insulin Glargine (BASAGLAR KWIKPEN) 100 UNIT/ML  ? carvedilol (COREG) 25 MG tablet  ? Other Relevant Orders  ? Lipid panel  ? Epigastric pain      ? Relevant Orders  ? Lipase  ? ?  ?Past Medical History:  ?Diagnosis Date  ?? Closed  fracture dislocation of lumbar spine (HCC) 1987  ?? Diabetes mellitus without complication (HCC)   ?? Hypertension   ? ? ?No past surgical history on file. ? ?No family history on file. ? ?Social History  ? ?Socioeconomic History  ?? Marital status: Unknown  ?  Spouse name: Not on file  ?? Number of children: Not on file  ?? Years of education: Not on file  ?? Highest education level: Not on file  ?Occupational History  ?? Not on file  ?Tobacco Use  ?? Smoking status: Every Day  ?? Smokeless tobacco: Never  ?Vaping Use  ?? Vaping Use: Never used  ?Substance and Sexual Activity  ?? Alcohol use: Not Currently  ?? Drug use: Not on file  ?? Sexual activity: Not on file  ?Other Topics Concern  ?? Not on file  ?Social History Narrative  ?? Not on file  ? ?Social Determinants of Health  ? ?Financial Resource Strain: Not on file  ?Food Insecurity: Not on file  ?Transportation Needs: Not on file  ?Physical Activity: Not on file  ?Stress: Not on file  ?Social Connections: Not on file  ?Intimate Partner Violence: Not on file  ? ? ?ROS ?Review of Systems  ?Constitutional:  Positive for fatigue. Negative for fever.  ?HENT:  Positive for tinnitus. Negative for congestion, drooling, ear pain, facial swelling, postnasal drip, rhinorrhea, sinus pressure, sneezing, sore throat, trouble swallowing and voice change.   ?Eyes:  Positive for visual disturbance.  ?Respiratory:  Positive for cough, choking, shortness of breath and wheezing. Negative for apnea, chest tightness and stridor.   ?Cardiovascular: Negative.  Negative for chest pain, palpitations and leg swelling.  ?Gastrointestinal:  Positive for abdominal pain, nausea and vomiting. Negative for abdominal distention, anal bleeding, blood in stool, constipation, diarrhea and rectal pain.  ?Endocrine: Positive for polydipsia, polyphagia and polyuria.  ?Genitourinary: Negative.   ?Musculoskeletal: Negative.  Negative for arthralgias and myalgias.  ?     Hip pain  ?Skin: Negative.   Negative for rash.  ?Allergic/Immunologic: Negative.  Negative for environmental allergies and food allergies.  ?Neurological:  Positive for dizziness, light-headedness and headaches. Negative for syncope and weakness.  ?Hematological: Negative.  Negative for adenopathy. Does not bruise/bleed easily.  ?Psychiatric/Behavioral: Negative.  Negative for agitation, decreased concentration, dysphoric mood, hallucinations, self-injury, sleep disturbance and suicidal ideas. The patient is not nervous/anxious and is not hyperactive.   ? ?Objective:  ? ?Today's Vitals: There were no vitals taken for this visit. ? ?Physical Exam ?Vitals reviewed.  ?Constitutional:   ?   Appearance: Normal appearance. He is well-developed. He is obese. He is not diaphoretic.  ?HENT:  ?  Head: Normocephalic and atraumatic.  ?   Nose: Nose normal. No nasal deformity, septal deviation, mucosal edema or rhinorrhea.  ?   Right Sinus: No maxillary sinus tenderness or frontal sinus tenderness.  ?   Left Sinus: No maxillary sinus tenderness or frontal sinus tenderness.  ?   Mouth/Throat:  ?   Mouth: Mucous membranes are moist.  ?   Pharynx: Oropharynx is clear. No oropharyngeal exudate.  ?   Comments: Poor dentition ?Eyes:  ?   General: No scleral icterus. ?   Conjunctiva/sclera: Conjunctivae normal.  ?   Pupils: Pupils are equal, round, and reactive to light.  ?Neck:  ?   Thyroid: No thyromegaly.  ?   Vascular: No carotid bruit or JVD.  ?   Trachea: Trachea normal. No tracheal tenderness or tracheal deviation.  ?Cardiovascular:  ?   Rate and Rhythm: Normal rate and regular rhythm.  ?   Chest Wall: PMI is not displaced.  ?   Pulses: Normal pulses. No decreased pulses.  ?   Heart sounds: Normal heart sounds, S1 normal and S2 normal. Heart sounds not distant. No murmur heard. ?No systolic murmur is present.  ?No diastolic murmur is present.  ?  No friction rub. No gallop. No S3 or S4 sounds.  ?Pulmonary:  ?   Effort: Pulmonary effort is normal. No  tachypnea, accessory muscle usage or respiratory distress.  ?   Breath sounds: No stridor. Wheezing present. No decreased breath sounds, rhonchi or rales.  ?Chest:  ?   Chest wall: No tenderness.  ?Abdominal:  ?

## 2022-02-03 ENCOUNTER — Ambulatory Visit: Payer: Self-pay | Admitting: Critical Care Medicine

## 2022-02-08 ENCOUNTER — Other Ambulatory Visit: Payer: Self-pay

## 2022-02-15 ENCOUNTER — Other Ambulatory Visit (HOSPITAL_COMMUNITY): Payer: Self-pay

## 2022-02-15 ENCOUNTER — Other Ambulatory Visit: Payer: Self-pay

## 2022-02-15 MED ORDER — UNIFINE PENTIPS 31G X 8 MM MISC
2 refills | Status: DC
Start: 2022-02-14 — End: 2022-07-20
  Filled 2022-02-15: qty 100, 30d supply, fill #0
  Filled 2022-03-31: qty 100, 30d supply, fill #1
  Filled 2022-05-05: qty 100, 30d supply, fill #2

## 2022-02-16 ENCOUNTER — Ambulatory Visit (INDEPENDENT_AMBULATORY_CARE_PROVIDER_SITE_OTHER): Payer: Medicaid Other | Admitting: Physician Assistant

## 2022-02-16 ENCOUNTER — Other Ambulatory Visit (HOSPITAL_COMMUNITY): Payer: Self-pay

## 2022-02-16 ENCOUNTER — Other Ambulatory Visit: Payer: Self-pay

## 2022-02-16 ENCOUNTER — Encounter: Payer: Self-pay | Admitting: Physician Assistant

## 2022-02-16 VITALS — BP 145/80 | HR 78 | Temp 98.0°F | Resp 18 | Ht 64.0 in | Wt 171.0 lb

## 2022-02-16 DIAGNOSIS — E1165 Type 2 diabetes mellitus with hyperglycemia: Secondary | ICD-10-CM | POA: Diagnosis not present

## 2022-02-16 DIAGNOSIS — I10 Essential (primary) hypertension: Secondary | ICD-10-CM

## 2022-02-16 DIAGNOSIS — J449 Chronic obstructive pulmonary disease, unspecified: Secondary | ICD-10-CM

## 2022-02-16 DIAGNOSIS — Z5900 Homelessness unspecified: Secondary | ICD-10-CM

## 2022-02-16 DIAGNOSIS — F411 Generalized anxiety disorder: Secondary | ICD-10-CM | POA: Diagnosis not present

## 2022-02-16 DIAGNOSIS — F1011 Alcohol abuse, in remission: Secondary | ICD-10-CM

## 2022-02-16 MED ORDER — LANTUS SOLOSTAR 100 UNIT/ML ~~LOC~~ SOPN
10.0000 [IU] | PEN_INJECTOR | Freq: Every day | SUBCUTANEOUS | 1 refills | Status: DC
  Filled 2022-02-16: qty 3, 28d supply, fill #0

## 2022-02-16 MED ORDER — HYDROXYZINE HCL 10 MG PO TABS
10.0000 mg | ORAL_TABLET | Freq: Three times a day (TID) | ORAL | 0 refills | Status: DC | PRN
  Filled 2022-02-16: qty 30, 5d supply, fill #0

## 2022-02-16 NOTE — Progress Notes (Signed)
? ?Established Patient Office Visit ? ?Subjective:  ?Patient ID: Stanley Stone, male    DOB: 01-Aug-1966  Age: 56 y.o. MRN: [id number] ? ?CC:  ?Chief Complaint  ?Patient presents with  ? Diabetes  ? Hypertension  ? ? ?HPI ?Stanley Stone reports that he has been compliant to his medications, states that he is feeling "much better". ? ?States that he does continue to drink 40 ounces of beer at night.  States that he really wants to stop drinking alcohol but states that he feels he is unable to stop due to having racing thoughts, difficulty falling asleep and staying asleep.  States that he is currently staying in an abandoned house, does have a mattress to sleep on. ? ?States that he feels his anxiety throughout the day has been elevated and becomes worse at night.   ? ? ? ? ?Past Medical History:  ?Diagnosis Date  ? Closed fracture dislocation of lumbar spine ([place]) 1987  ? Diabetes mellitus without complication ([place])   ? Hypertension   ? ? ?History reviewed. No pertinent surgical history. ? ?History reviewed. No pertinent family history. ? ?Social History  ? ?Socioeconomic History  ? Marital status: Unknown  ?  Spouse name: Not on file  ? Number of children: Not on file  ? Years of education: Not on file  ? Highest education level: Not on file  ?Occupational History  ? Not on file  ?Tobacco Use  ? Smoking status: Every Day  ?  Packs/day: 1.00  ?  Types: Cigarettes  ? Smokeless tobacco: Never  ?Vaping Use  ? Vaping Use: Never used  ?Substance and Sexual Activity  ? Alcohol use: Yes  ?  Comment: 40oz/night  ? Drug use: Not Currently  ? Sexual activity: Not Currently  ?Other Topics Concern  ? Not on file  ?Social History Narrative  ? Not on file  ? ?Social Determinants of Health  ? ?Financial Resource Strain: Not on file  ?Food Insecurity: Not on file  ?Transportation Needs: Not on file  ?Physical Activity: Not on file  ?Stress: Not on file  ?Social Connections: Not on file  ?Intimate Partner Violence: Not on file   ? ? ?Outpatient Medications Prior to Visit  ?Medication Sig Dispense Refill  ? albuterol (VENTOLIN HFA) 108 (90 Base) MCG/ACT inhaler Inhale 2 puffs into the lungs every 6 (six) hours as needed for wheezing or shortness of breath. 18 g 0  ? amLODipine (NORVASC) 10 MG tablet Take 1 tablet (10 mg total) by mouth daily. 60 tablet 1  ? atorvastatin (LIPITOR) 40 MG tablet Take 1 tablet (40 mg total) by mouth daily. 60 tablet 1  ? Blood Glucose Monitoring Suppl (TRUE METRIX METER) w/Device KIT Use as directed daily to check blood sugar 1 kit 0  ? carvedilol (COREG) 25 MG tablet Take 1 tablet (25 mg total) by mouth 2 (two) times daily with a meal. 60 tablet 3  ? fluticasone-salmeterol (ADVAIR HFA) 115-21 MCG/ACT inhaler Inhale 2 puffs into the lungs 2 (two) times daily. 12 g 12  ? glucose blood (TRUE METRIX BLOOD GLUCOSE TEST) test strip Use as instructed 100 each 1  ? Insulin Pen Needle (UNIFINE PENTIPS) 31G X 8 MM MISC Use with insulin pen 100 each 2  ? lisinopril-hydrochlorothiazide (ZESTORETIC) 20-25 MG tablet Take 1 tablet by mouth daily. 90 tablet 1  ? metFORMIN (GLUCOPHAGE) 500 MG tablet Take 1 tablet (500 mg total) by mouth 2 (two) times daily with a meal. 120 tablet 1  ?  pantoprazole (PROTONIX) 40 MG tablet Take 1 tablet (40 mg total) by mouth daily. 30 tablet 3  ? TRUEplus Lancets 28G MISC Check blood sugar daily 100 each 1  ? Insulin Glargine (BASAGLAR KWIKPEN) 100 UNIT/ML Inject 10 Units into the skin daily. 6 mL 2  ? ?No facility-administered medications prior to visit.  ? ? ?Allergies  ?Allergen Reactions  ? Penicillins   ?  Swell Up   ? ? ?ROS ?Review of Systems  ?Constitutional: Negative.   ?HENT: Negative.    ?Eyes: Negative.   ?Respiratory:  Negative for shortness of breath.   ?Cardiovascular:  Negative for chest pain.  ?Gastrointestinal: Negative.   ?Endocrine: Negative.   ?Genitourinary: Negative.   ?Musculoskeletal: Negative.   ?Skin: Negative.   ?Allergic/Immunologic: Negative.   ?Neurological:  Negative.   ?Hematological: Negative.   ?Psychiatric/Behavioral:  Positive for sleep disturbance. Negative for dysphoric mood, self-injury and suicidal ideas. The patient is nervous/anxious.   ? ?  ?Objective:  ?  ?Physical Exam ?Vitals and nursing note reviewed.  ?Constitutional:   ?   Appearance: Normal appearance.  ?HENT:  ?   Head: Normocephalic and atraumatic.  ?   Right Ear: External ear normal.  ?   Left Ear: External ear normal.  ?   Nose: Nose normal.  ?   Mouth/Throat:  ?   Mouth: Mucous membranes are moist.  ?   Pharynx: Oropharynx is clear.  ?Eyes:  ?   Extraocular Movements: Extraocular movements intact.  ?   Conjunctiva/sclera: Conjunctivae normal.  ?   Pupils: Pupils are equal, round, and reactive to light.  ?Cardiovascular:  ?   Rate and Rhythm: Normal rate and regular rhythm.  ?   Pulses: Normal pulses.  ?   Heart sounds: Normal heart sounds.  ?Pulmonary:  ?   Effort: Pulmonary effort is normal.  ?   Breath sounds: Normal breath sounds. No wheezing.  ?Musculoskeletal:     ?   General: Normal range of motion.  ?   Cervical back: Normal range of motion and neck supple.  ?Skin: ?   General: Skin is warm and dry.  ?Neurological:  ?   General: No focal deficit present.  ?   Mental Status: He is alert and oriented to person, place, and time.  ?Psychiatric:     ?   Mood and Affect: Mood normal.     ?   Behavior: Behavior normal.     ?   Thought Content: Thought content normal.     ?   Judgment: Judgment normal.  ? ? ?BP (!) 145/80 (BP Location: Right Arm, Patient Position: Sitting, Cuff Size: Normal)   Pulse 78   Temp 98 ?F (36.7 ?C) (Oral)   Resp 18   Ht $R'5\' 4"'Dg$  (1.626 m)   Wt 171 lb (77.6 kg)   SpO2 95%   BMI 29.35 kg/m?  ?Wt Readings from Last 3 Encounters:  ?02/16/22 171 lb (77.6 kg)  ?01/18/22 178 lb (80.7 kg)  ?02/07/19 193 lb 9.6 oz (87.8 kg)  ? ? ? ?Health Maintenance Due  ?Topic Date Due  ? OPHTHALMOLOGY EXAM  Never done  ? COLON CANCER SCREENING ANNUAL FOBT  Never done  ? ? ?There are no  preventive care reminders to display for this patient. ? ?No results found for: TSH ?Lab Results  ?Component Value Date  ? WBC 9.5 01/18/2022  ? HGB 16.6 01/18/2022  ? HCT 48.5 01/18/2022  ? MCV 89 01/18/2022  ? PLT 272 01/18/2022  ? ?  Lab Results  ?Component Value Date  ? NA 134 01/18/2022  ? K 4.7 01/18/2022  ? CO2 28 01/18/2022  ? GLUCOSE 220 (H) 01/18/2022  ? BUN 22 01/18/2022  ? CREATININE 1.14 01/18/2022  ? BILITOT 0.7 01/18/2022  ? ALKPHOS 111 01/18/2022  ? AST 18 01/18/2022  ? ALT 19 01/18/2022  ? PROT 7.4 01/18/2022  ? ALBUMIN 4.7 01/18/2022  ? CALCIUM 10.2 01/18/2022  ? EGFR 75 01/18/2022  ? ?Lab Results  ?Component Value Date  ? CHOL 241 (H) 01/18/2022  ? ?Lab Results  ?Component Value Date  ? HDL 46 01/18/2022  ? ?Lab Results  ?Component Value Date  ? LDLCALC 119 (H) 01/18/2022  ? ?Lab Results  ?Component Value Date  ? TRIG 432 (H) 01/18/2022  ? ?Lab Results  ?Component Value Date  ? CHOLHDL 5.2 (H) 01/18/2022  ? ?Lab Results  ?Component Value Date  ? HGBA1C 9.1 (A) 01/18/2022  ? ? ?  ?Assessment & Plan:  ? ?Problem List Items Addressed This Visit   ? ?  ? Cardiovascular and Mediastinum  ? HTN (hypertension)  ?  ? Respiratory  ? COPD with chronic bronchitis ([place])  ?  ? Endocrine  ? DM (diabetes mellitus), type 2 ([place]) - Primary  ? Relevant Medications  ? insulin glargine (LANTUS SOLOSTAR) 100 UNIT/ML Solostar Pen  ?  ? Other  ? History of alcohol abuse  ? Homelessness  ? Anxiety state  ? Relevant Medications  ? hydrOXYzine (ATARAX) 10 MG tablet  ? ? ?Meds ordered this encounter  ?Medications  ? insulin glargine (LANTUS SOLOSTAR) 100 UNIT/ML Solostar Pen  ?  Sig: Inject 10 Units into the skin daily.  ?  Dispense:  15 mL  ?  Refill:  1  ?  DOH ?D/C basaglar  ?  Order Specific Question:   Supervising Provider  ?  Answer:   [name] [[id number]]  ? hydrOXYzine (ATARAX) 10 MG tablet  ?  Sig: Take 1 tablet (10 mg total) by mouth 3 (three) times daily as needed. Can use up to 3 tablets ($RemoveBe'30mg'BbsAPQkhT$ ) every night at  bedtime as needed. (max of 6 tablets daily)  ?  Dispense:  30 tablet  ?  Refill:  0  ?  [place]  ?  Order Specific Question:   Supervising Provider  ?  Answer:   [name] [[id number]]  ?1. Type 2

## 2022-02-16 NOTE — Patient Instructions (Signed)
To help with your stress and anxiety during the day, you can use 10 mg of hydroxyzine every 8 hours.  To help with your insomnia, you can use 2 to 3 tablets, up to 30 mg at bedtime. ? ?Please let us know if there is anything else we can do for you ? ?Roney Jaffe, PA-C ?Physician Assistant ?[facility] ?[email or web address] ? ? ?Managing Anxiety, Adult ?After being diagnosed with anxiety, you may be relieved to know why you have felt or behaved a certain way. You may also feel overwhelmed about the treatment ahead and what it will mean for your life. With care and support, you can manage this condition. ?How to manage lifestyle changes ?Managing stress and anxiety ?Stress is your body's reaction to life changes and events, both good and bad. Most stress will last just a few hours, but stress can be ongoing and can lead to more than just stress. Although stress can play a major role in anxiety, it is not the same as anxiety. Stress is usually caused by something external, such as a deadline, test, or competition. Stress normally passes after the triggering event has ended.  ?Anxiety is caused by something internal, such as imagining a terrible outcome or worrying that something will go wrong that will devastate you. Anxiety often does not go away even after the triggering event is over, and it can become long-term (chronic) worry. It is important to understand the differences between stress and anxiety and to manage your stress effectively so that it does not lead to an anxious response. ?Talk with your health care provider or a counselor to learn more about reducing anxiety and stress. He or she may suggest tension reduction techniques, such as: ?Music therapy. Spend time creating or listening to music that you enjoy and that inspires you. ?Mindfulness-based meditation. Practice being aware of your normal breaths while not trying to control your breathing. It  can be done while sitting or walking. ?Centering prayer. This involves focusing on a word, phrase, or sacred image that means something to you and brings you peace. ?Deep breathing. To do this, expand your stomach and inhale slowly through your nose. Hold your breath for 3-5 seconds. Then exhale slowly, letting your stomach muscles relax. ?Self-talk. Learn to notice and identify thought patterns that lead to anxiety reactions and change those patterns to thoughts that feel peaceful. ?Muscle relaxation. Taking time to tense muscles and then relax them. ?Choose a tension reduction technique that fits your lifestyle and personality. These techniques take time and practice. Set aside 5-15 minutes a day to do them. Therapists can offer counseling and training in these techniques. The training to help with anxiety may be covered by some insurance plans. ?Other things you can do to manage stress and anxiety include: ?Keeping a stress diary. This can help you learn what triggers your reaction and then learn ways to manage your response. ?Thinking about how you react to certain situations. You may not be able to control everything, but you can control your response. ?Making time for activities that help you relax and not feeling guilty about spending your time in this way. ?Doing visual imagery. This involves imagining or creating mental pictures to help you relax. ?Practicing yoga. Through yoga poses, you can lower tension and promote relaxation. ? ?Medicines ?Medicines can help ease symptoms. Medicines for anxiety include: ?Antidepressant medicines. These are usually prescribed for long-term daily control. ?Anti-anxiety medicines. These may be added in severe cases, especially when  panic attacks occur. ?Medicines will be prescribed by a health care provider. When used together, medicines, psychotherapy, and tension reduction techniques may be the most effective treatment. ?Relationships ?Relationships can play a big part  in helping you recover. Try to spend more time connecting with trusted friends and family members. ?Consider going to couples counseling if you have a partner, taking family education classes, or going to family therapy. ?Therapy can help you and others better understand your condition. ?How to recognize changes in your anxiety ?Everyone responds differently to treatment for anxiety. Recovery from anxiety happens when symptoms decrease and stop interfering with your daily activities at home or work. This may mean that you will start to: ?Have better concentration and focus. Worry will interfere less in your daily thinking. ?Sleep better. ?Be less irritable. ?Have more energy. ?Have improved memory. ?It is also important to recognize when your condition is getting worse. Contact your health care provider if your symptoms interfere with home or work and you feel like your condition is not improving. ?Follow these instructions at home: ?Activity ?Exercise. Adults should do the following: ?Exercise for at least 150 minutes each week. The exercise should increase your heart rate and make you sweat (moderate-intensity exercise). ?Strengthening exercises at least twice a week. ?Get the right amount and quality of sleep. Most adults need 7-9 hours of sleep each night. ?Lifestyle ? ?Eat a healthy diet that includes plenty of vegetables, fruits, whole grains, low-fat dairy products, and lean protein. ?Do not eat a lot of foods that are high in fats, added sugars, or salt (sodium). ?Make choices that simplify your life. ?Do not use any products that contain nicotine or tobacco. These products include cigarettes, chewing tobacco, and vaping devices, such as e-cigarettes. If you need help quitting, ask your health care provider. ?Avoid caffeine, alcohol, and certain over-the-counter cold medicines. These may make you feel worse. Ask your pharmacist which medicines to avoid. ?General instructions ?Take over-the-counter and  prescription medicines only as told by your health care provider. ?Keep all follow-up visits. This is important. ?Where to find support ?You can get help and support from these sources: ?Self-help groups. ?Online and [employer]. ?A trusted spiritual leader. ?Couples counseling. ?Family education classes. ?Family therapy. ?Where to find more information ?You may find that joining a support group helps you deal with your anxiety. The following sources can help you locate counselors or support groups near you: ?Mental Health America: www.mentalhealthamerica.net ?Anxiety and Depression Association of America (ADAA): ProgramCam.de ?National Alliance on Mental Illness (NAMI): www.nami.org ?Contact a health care provider if: ?You have a hard time staying focused or finishing daily tasks. ?You spend many hours a day feeling worried about everyday life. ?You become exhausted by worry. ?You start to have headaches or frequently feel tense. ?You develop chronic nausea or diarrhea. ?Get help right away if: ?You have a racing heart and shortness of breath. ?You have thoughts of hurting yourself or others. ?If you ever feel like you may hurt yourself or others, or have thoughts about taking your own life, get help right away. Go to your nearest emergency department or: ?Call your local emergency services (911 in the [place] ?Call a suicide crisis helpline, such as the National Suicide Prevention Lifeline at (301) 793-1145 or 988 in the [place] This is open 24 hours a day in the [place] ?Text the Crisis Text Line at 503-657-2733 (in the [place] ?Summary ?Taking steps to learn and use tension reduction techniques can help calm you and help prevent  triggering an anxiety reaction. ?When used together, medicines, psychotherapy, and tension reduction techniques may be the most effective treatment. ?Family, friends, and partners can play a big part in supporting you. ?This information is not intended to replace advice given to you by your  health care provider. Make sure you discuss any questions you have with your health care provider. ?Document Revised: 05/26/2021 Document Reviewed: 02/21/2021 ?Elsevier Patient Education ? 2022 Elsevier Inc.

## 2022-02-16 NOTE — Progress Notes (Signed)
Patient has eaten and taken medication today. ?Patient reports chronic lower back pain scaled at a 9 and described as sharp pains. ? ?

## 2022-02-18 DIAGNOSIS — F411 Generalized anxiety disorder: Secondary | ICD-10-CM | POA: Insufficient documentation

## 2022-02-23 ENCOUNTER — Other Ambulatory Visit: Payer: Self-pay

## 2022-03-17 ENCOUNTER — Other Ambulatory Visit: Payer: Self-pay

## 2022-03-17 ENCOUNTER — Other Ambulatory Visit: Payer: Self-pay | Admitting: Pharmacist

## 2022-03-17 ENCOUNTER — Other Ambulatory Visit: Payer: Self-pay | Admitting: Critical Care Medicine

## 2022-03-17 MED ORDER — TRUEPLUS LANCETS 28G MISC
1 refills | Status: DC
  Filled 2022-03-17: qty 100, 25d supply, fill #0
  Filled 2022-05-05: qty 100, 100d supply, fill #1

## 2022-03-17 MED ORDER — BASAGLAR KWIKPEN 100 UNIT/ML ~~LOC~~ SOPN
10.0000 [IU] | PEN_INJECTOR | Freq: Every day | SUBCUTANEOUS | 2 refills | Status: DC
  Filled 2022-03-17: qty 3, 28d supply, fill #0

## 2022-03-17 MED ORDER — TRUE METRIX BLOOD GLUCOSE TEST VI STRP
ORAL_STRIP | 1 refills | Status: DC
Start: 2022-03-17 — End: 2022-07-20
  Filled 2022-03-17: qty 100, 25d supply, fill #0
  Filled 2022-05-05: qty 100, 100d supply, fill #1

## 2022-03-17 NOTE — Telephone Encounter (Signed)
Requested Prescriptions  ?Pending Prescriptions Disp Refills  ?? TRUEplus Lancets 28G MISC 100 each 1  ?  Sig: Check blood sugar daily  ?  ? Endocrinology: Diabetes - Testing Supplies Passed - 03/17/2022  4:26 PM  ?  ?  Passed - Valid encounter within last 12 months  ?  Recent Outpatient Visits   ?      ? 4 weeks ago Type 2 diabetes mellitus with hyperglycemia, without long-term current use of insulin (HCC)  ? [facility], Cari S, PA-C  ? 1 month ago Type 2 diabetes mellitus without complication, without long-term current use of insulin (HCC)  ? [facility] [name], MD  ? 3 years ago Essential hypertension  ? [facility] [name], MD  ?  ?  ? ?  ?  ?  ?? glucose blood (TRUE METRIX BLOOD GLUCOSE TEST) test strip 100 each 1  ?  Sig: Use as instructed  ?  ? Endocrinology: Diabetes - Testing Supplies Passed - 03/17/2022  4:26 PM  ?  ?  Passed - Valid encounter within last 12 months  ?  Recent Outpatient Visits   ?      ? 4 weeks ago Type 2 diabetes mellitus with hyperglycemia, without long-term current use of insulin (HCC)  ? [facility] at [facility], Cari S, PA-C  ? 1 month ago Type 2 diabetes mellitus without complication, without long-term current use of insulin (HCC)  ? [facility] [name], MD  ? 3 years ago Essential hypertension  ? [facility] [name], MD  ?  ?  ? ?  ?  ?  ? ? ?

## 2022-03-18 ENCOUNTER — Other Ambulatory Visit: Payer: Self-pay

## 2022-03-21 ENCOUNTER — Other Ambulatory Visit: Payer: Self-pay

## 2022-03-25 ENCOUNTER — Encounter (HOSPITAL_BASED_OUTPATIENT_CLINIC_OR_DEPARTMENT_OTHER): Payer: Self-pay | Admitting: Emergency Medicine

## 2022-03-25 ENCOUNTER — Emergency Department (HOSPITAL_COMMUNITY): Payer: Medicaid Other

## 2022-03-25 ENCOUNTER — Other Ambulatory Visit: Payer: Self-pay

## 2022-03-25 ENCOUNTER — Observation Stay (HOSPITAL_BASED_OUTPATIENT_CLINIC_OR_DEPARTMENT_OTHER)
Admission: EM | Admit: 2022-03-25 | Discharge: 2022-03-28 | Disposition: A | Discharge status: Home / Self Care | Payer: Medicaid Other | Attending: Internal Medicine | Admitting: Internal Medicine

## 2022-03-25 ENCOUNTER — Emergency Department (HOSPITAL_BASED_OUTPATIENT_CLINIC_OR_DEPARTMENT_OTHER): Payer: Medicaid Other

## 2022-03-25 ENCOUNTER — Encounter: Payer: Self-pay | Admitting: Pediatric Intensive Care

## 2022-03-25 DIAGNOSIS — F1011 Alcohol abuse, in remission: Secondary | ICD-10-CM | POA: Diagnosis not present

## 2022-03-25 DIAGNOSIS — Z794 Long term (current) use of insulin: Secondary | ICD-10-CM | POA: Diagnosis not present

## 2022-03-25 DIAGNOSIS — Z5900 Homelessness unspecified: Secondary | ICD-10-CM

## 2022-03-25 DIAGNOSIS — E876 Hypokalemia: Secondary | ICD-10-CM | POA: Insufficient documentation

## 2022-03-25 DIAGNOSIS — E1165 Type 2 diabetes mellitus with hyperglycemia: Secondary | ICD-10-CM | POA: Diagnosis not present

## 2022-03-25 DIAGNOSIS — R29898 Other symptoms and signs involving the musculoskeletal system: Secondary | ICD-10-CM | POA: Diagnosis not present

## 2022-03-25 DIAGNOSIS — F17210 Nicotine dependence, cigarettes, uncomplicated: Secondary | ICD-10-CM | POA: Diagnosis not present

## 2022-03-25 DIAGNOSIS — Z720 Tobacco use: Secondary | ICD-10-CM | POA: Diagnosis present

## 2022-03-25 DIAGNOSIS — G459 Transient cerebral ischemic attack, unspecified: Secondary | ICD-10-CM

## 2022-03-25 DIAGNOSIS — Z79899 Other long term (current) drug therapy: Secondary | ICD-10-CM | POA: Diagnosis not present

## 2022-03-25 DIAGNOSIS — J449 Chronic obstructive pulmonary disease, unspecified: Secondary | ICD-10-CM | POA: Diagnosis not present

## 2022-03-25 DIAGNOSIS — E119 Type 2 diabetes mellitus without complications: Secondary | ICD-10-CM | POA: Insufficient documentation

## 2022-03-25 DIAGNOSIS — I63511 Cerebral infarction due to unspecified occlusion or stenosis of right middle cerebral artery: Secondary | ICD-10-CM | POA: Diagnosis present

## 2022-03-25 DIAGNOSIS — J42 Unspecified chronic bronchitis: Secondary | ICD-10-CM | POA: Diagnosis present

## 2022-03-25 DIAGNOSIS — Z7984 Long term (current) use of oral hypoglycemic drugs: Secondary | ICD-10-CM | POA: Diagnosis not present

## 2022-03-25 DIAGNOSIS — R4701 Aphasia: Principal | ICD-10-CM | POA: Insufficient documentation

## 2022-03-25 DIAGNOSIS — I10 Essential (primary) hypertension: Secondary | ICD-10-CM | POA: Diagnosis not present

## 2022-03-25 DIAGNOSIS — R531 Weakness: Secondary | ICD-10-CM | POA: Diagnosis not present

## 2022-03-25 DIAGNOSIS — R200 Anesthesia of skin: Secondary | ICD-10-CM | POA: Insufficient documentation

## 2022-03-25 DIAGNOSIS — I639 Cerebral infarction, unspecified: Secondary | ICD-10-CM

## 2022-03-25 DIAGNOSIS — Z8673 Personal history of transient ischemic attack (TIA), and cerebral infarction without residual deficits: Secondary | ICD-10-CM | POA: Diagnosis present

## 2022-03-25 HISTORY — DX: Gastro-esophageal reflux disease without esophagitis: K21.9

## 2022-03-25 HISTORY — DX: Chronic obstructive pulmonary disease, unspecified: J44.9

## 2022-03-25 HISTORY — DX: Pure hypercholesterolemia, unspecified: E78.00

## 2022-03-25 HISTORY — DX: Cerebral infarction, unspecified: I63.9

## 2022-03-25 LAB — COMPREHENSIVE METABOLIC PANEL
ALT: 26 U/L (ref 0–44)
AST: 20 U/L (ref 15–41)
Albumin: 4 g/dL (ref 3.5–5.0)
Alkaline Phosphatase: 83 U/L (ref 38–126)
Anion gap: 9 (ref 5–15)
BUN: 24 mg/dL — ABNORMAL HIGH (ref 6–20)
CO2: 30 mmol/L (ref 22–32)
Calcium: 9.5 mg/dL (ref 8.9–10.3)
Chloride: 100 mmol/L (ref 98–111)
Creatinine, Ser: 1.26 mg/dL — ABNORMAL HIGH (ref 0.61–1.24)
GFR, Estimated: >60 mL/min (ref >60)
Glucose, Bld: 226 mg/dL — ABNORMAL HIGH (ref 70–99)
Potassium: 3.8 mmol/L (ref 3.5–5.1)
Sodium: 139 mmol/L (ref 135–145)
Total Bilirubin: 0.9 mg/dL (ref 0.3–1.2)
Total Protein: 7.3 g/dL (ref 6.5–8.1)

## 2022-03-25 LAB — TROPONIN I (HIGH SENSITIVITY): Troponin I (High Sensitivity): 4 ng/L (ref <18)

## 2022-03-25 LAB — PROTIME-INR
INR: 1 (ref 0.8–1.2)
Prothrombin Time: 12.5 seconds (ref 11.4–15.2)

## 2022-03-25 LAB — CBG MONITORING, ED: Glucose-Capillary: 237 mg/dL — ABNORMAL HIGH (ref 70–99)

## 2022-03-25 LAB — CBC WITH DIFFERENTIAL/PLATELET
Abs Immature Granulocytes: 0.03 10*3/uL (ref 0.00–0.07)
Basophils Absolute: 0 10*3/uL (ref 0.0–0.1)
Basophils Relative: 0 %
Eosinophils Absolute: 0.1 10*3/uL (ref 0.0–0.5)
Eosinophils Relative: 1 %
HCT: 41.3 % (ref 39.0–52.0)
Hemoglobin: 13.8 g/dL (ref 13.0–17.0)
Immature Granulocytes: 0 %
Lymphocytes Relative: 22 %
Lymphs Abs: 2.3 10*3/uL (ref 0.7–4.0)
MCH: 30.3 pg (ref 26.0–34.0)
MCHC: 33.4 g/dL (ref 30.0–36.0)
MCV: 90.6 fL (ref 80.0–100.0)
Monocytes Absolute: 0.8 10*3/uL (ref 0.1–1.0)
Monocytes Relative: 8 %
Neutro Abs: 6.8 10*3/uL (ref 1.7–7.7)
Neutrophils Relative %: 69 %
Platelets: 254 10*3/uL (ref 150–400)
RBC: 4.56 MIL/uL (ref 4.22–5.81)
RDW: 13 % (ref 11.5–15.5)
WBC: 10 10*3/uL (ref 4.0–10.5)
nRBC: 0 % (ref 0.0–0.2)

## 2022-03-25 LAB — ETHANOL: Alcohol, Ethyl (B): <10 mg/dL (ref <10)

## 2022-03-25 LAB — FOLATE: Folate: 16 ng/mL (ref >5.9)

## 2022-03-25 LAB — VITAMIN B12: Vitamin B-12: 228 pg/mL (ref 180–914)

## 2022-03-25 LAB — AMMONIA: Ammonia: 19 umol/L (ref 9–35)

## 2022-03-25 LAB — TSH: TSH: 6.249 u[IU]/mL — ABNORMAL HIGH (ref 0.350–4.500)

## 2022-03-25 IMAGING — MR MR HEAD W/O CM
9 of 10 series · 35 of 48 positions shown · non-contrast
Comparison: Head CT [DATE]

CLINICAL DATA: Stroke follow-up. Right-sided numbness, weakness,
and difficulty speaking.

EXAM:
MRI HEAD WITHOUT CONTRAST
TECHNIQUE: Multiplanar, multiecho pulse sequences of the brain and surrounding
structures were obtained without intravenous contrast.

[Series 3: DWI · axial · 3.0mm · 1.09mm/px · z∈[-95,+63]mm · 8 of 108 slices shown (1 of 4)]
[im 1/108]
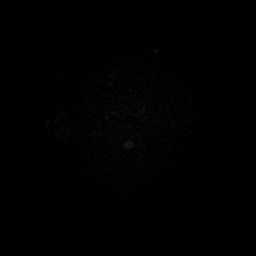
[im 12/108]
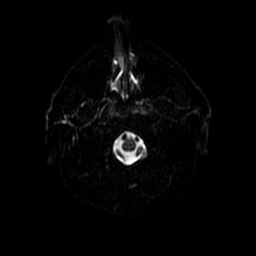
[im 36/108]
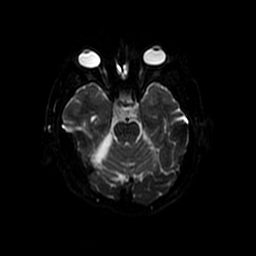
[im 48/108]
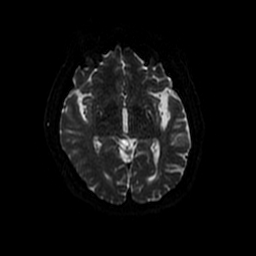
[im 60/108]
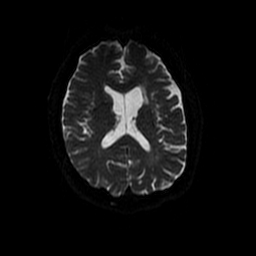
[im 72/108]
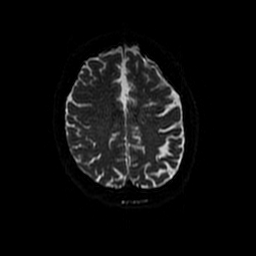
[im 96/108]
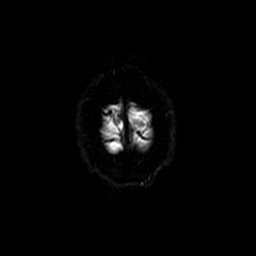
[im 108/108]
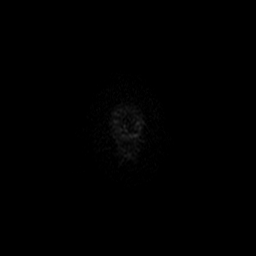

[Series 4: DWI · coronal · 5.0mm · 1.09mm/px · 7 of 74 slices shown (2 of 4)]
[im 1/74]
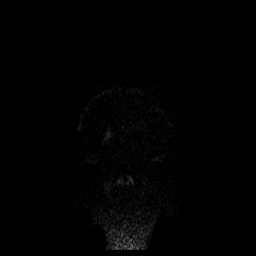
[im 13/74]
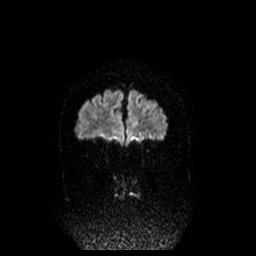
[im 25/74]
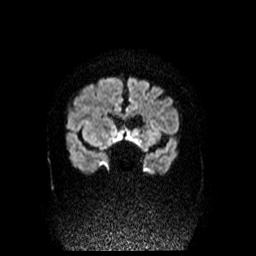
[im 37/74]
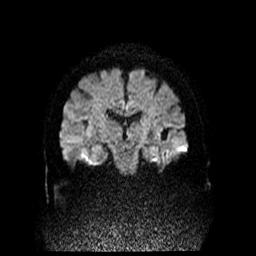
[im 49/74]
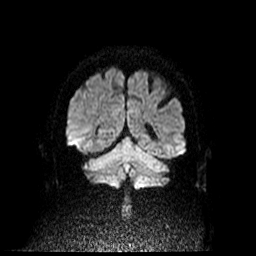
[im 61/74]
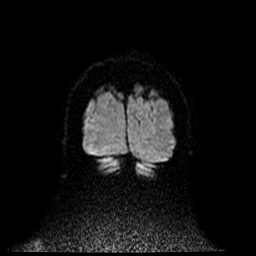
[im 74/74]
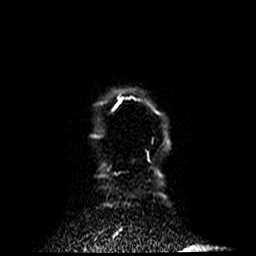

[Series 5: T1 · sagittal · 5.0mm · 0.47mm/px · 2 of 25 slices shown (1 of 2)]
[im 1/25]
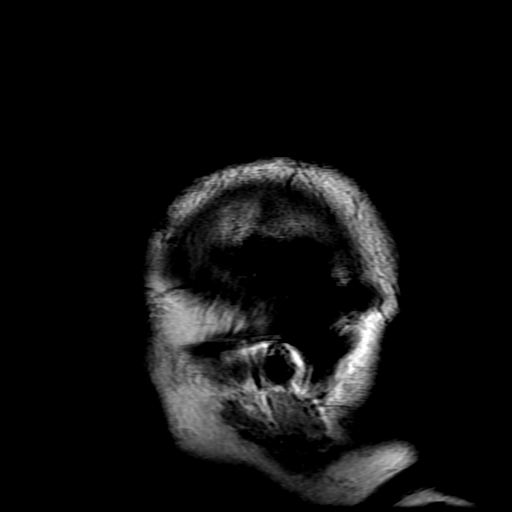
[im 25/25]
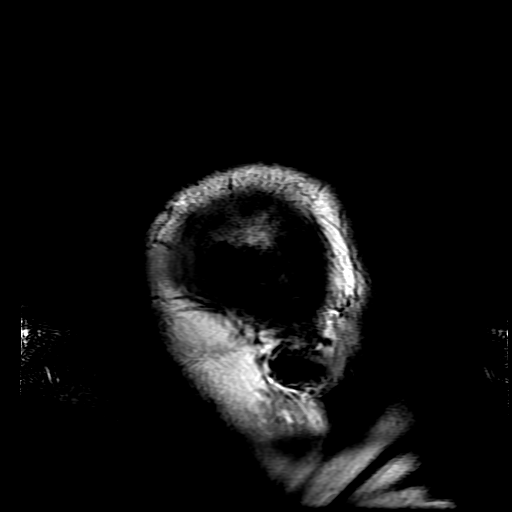

[Series 6: T2 · axial · 5.0mm · 0.43mm/px · z∈[-90,+65]mm · 3 of 27 slices shown (1 of 2)]
[im 1/27]
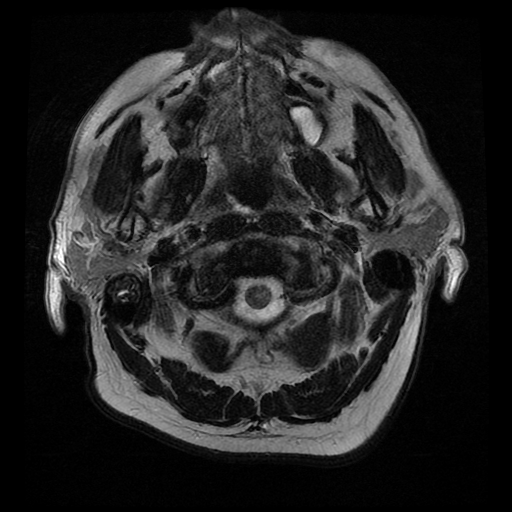
[im 14/27]
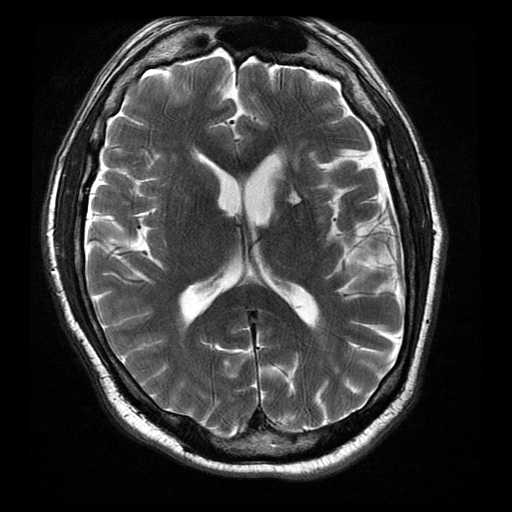
[im 27/27]
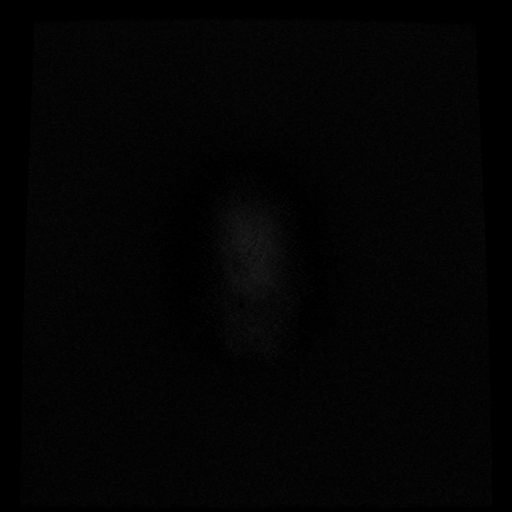

[Series 7: FLAIR · axial · 3.0mm · 0.43mm/px · z∈[-90,+65]mm · 3 of 27 slices shown]
[im 1/27]
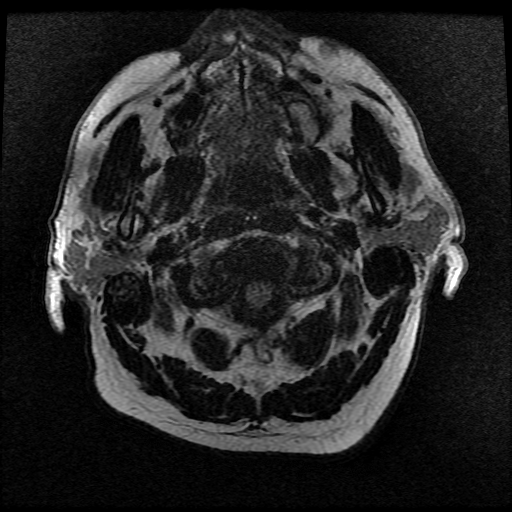
[im 14/27]
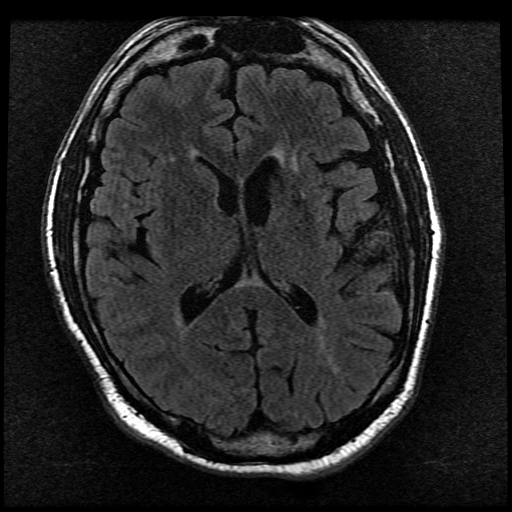
[im 27/27]
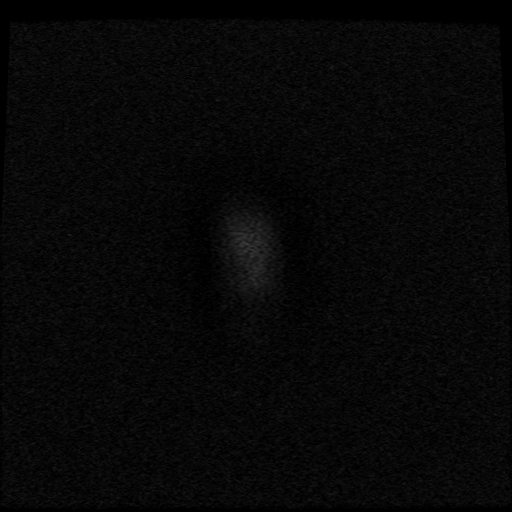

[Series 9: T1 · axial · 3.0mm · 0.47mm/px · 1 of 108 slices shown (2 of 2)]
[im 1/108]
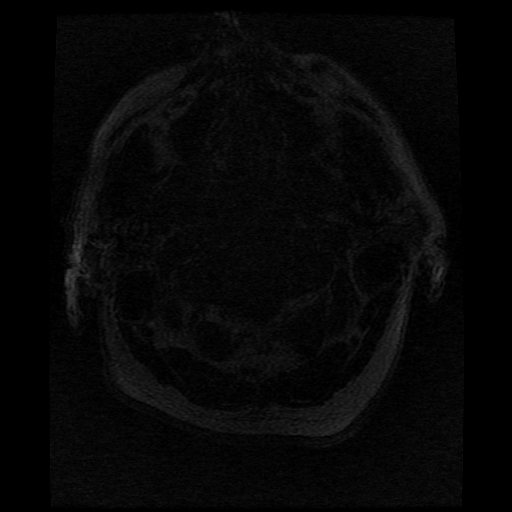

[Series 10: T2 · coronal · 5.0mm · 0.39mm/px · 3 of 28 slices shown (2 of 2)]
[im 1/28]
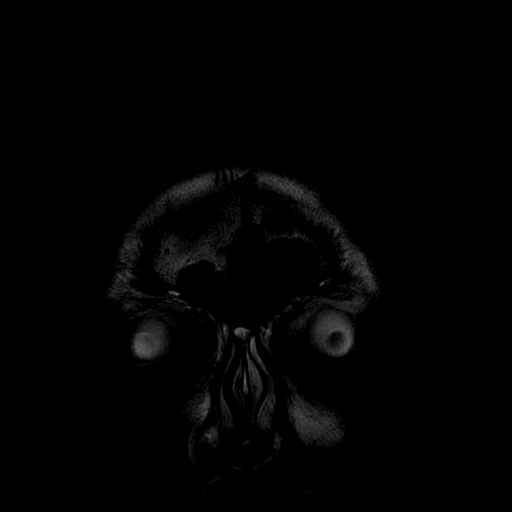
[im 14/28]
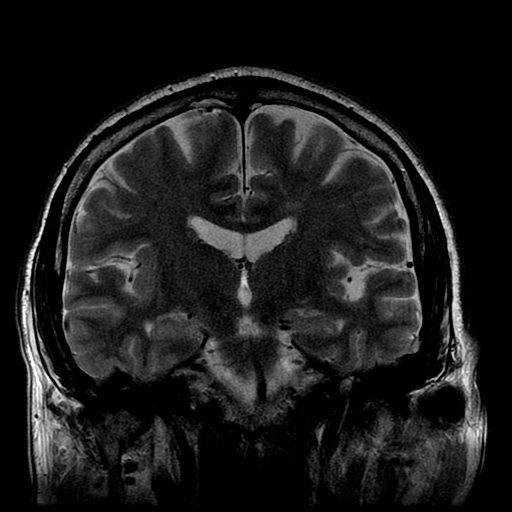
[im 28/28]
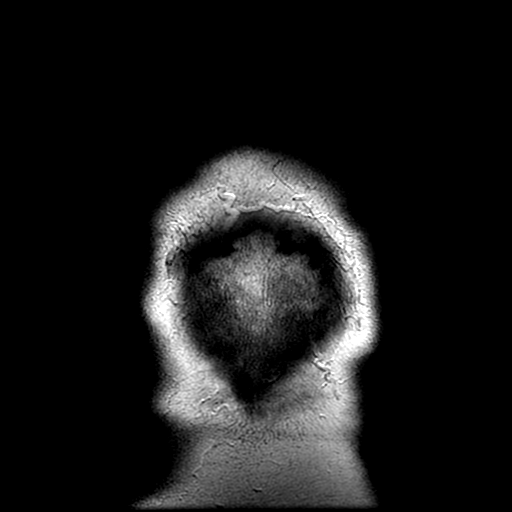

[Series 300: DWI · axial · 3.0mm · 1.09mm/px · z∈[-95,+63]mm · 5 of 54 slices shown (3 of 4)]
[im 1/54]
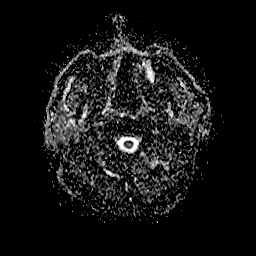
[im 14/54]
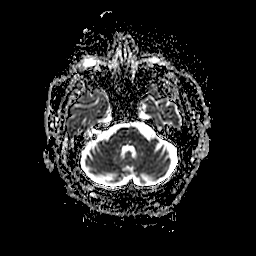
[im 27/54]
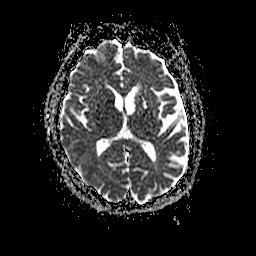
[im 40/54]
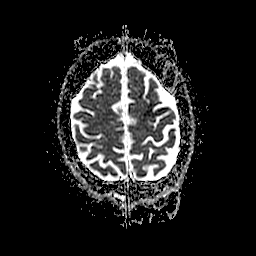
[im 54/54]
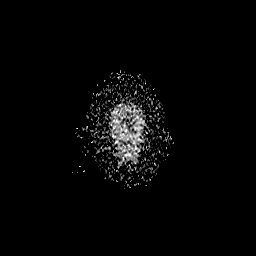

[Series 400: DWI · coronal · 5.0mm · 1.09mm/px · 3 of 37 slices shown (4 of 4)]
[im 1/37]
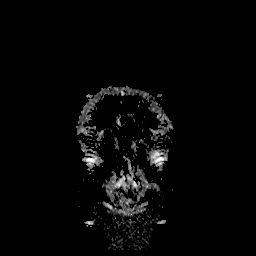
[im 19/37]
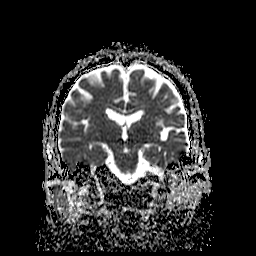
[im 37/37]
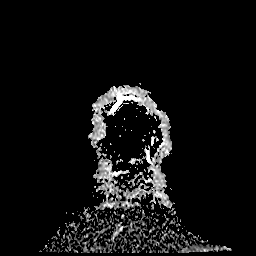

[35 of 48 positions shown; findings below may reference images not displayed]

FINDINGS: Brain: There is no evidence of an acute infarct, mass, midline
shift, or extra-axial fluid collection. A chronic infarct is noted
anteriorly in the left basal ganglia with associated chronic blood
products and ex vacuo dilatation of the frontal horn of the left
lateral ventricle. T2 hyperintensities elsewhere in the cerebral
white matter bilaterally are nonspecific but compatible with mild
chronic small vessel ischemic disease. A small chronic infarct is
noted in the left frontal white matter at the level of the centrum
semiovale. There is mild cerebral and mild-to-moderate cerebellar
atrophy.

Vascular: Major intracranial vascular flow voids are preserved.

Skull and upper cervical spine: Unremarkable bone marrow signal.

Sinuses/Orbits: Unremarkable orbits. Mild mucosal thickening/small
mucous retention cysts in the paranasal sinuses. Small right mastoid
effusion.

Other: None.
IMPRESSION: 1. No acute intracranial abnormality.
2. Mild chronic small vessel ischemic disease with chronic infarcts
in the left basal ganglia and left frontal white matter.
3. Cerebral and cerebellar atrophy.

## 2022-03-25 IMAGING — CT CT HEAD W/O CM
3 series · 15 of 47 positions shown, 18 images · non-contrast
Comparison: None Available.

CLINICAL DATA: Neurological deficit, slurred speech and RIGHT arm
numbness for 1 week, question stroke



[Series 2: head wo · axial · 0.44mm/px · z∈[-188,-58]mm · 9 of 32 slices shown, 12 images]
[im 3/32  brain]
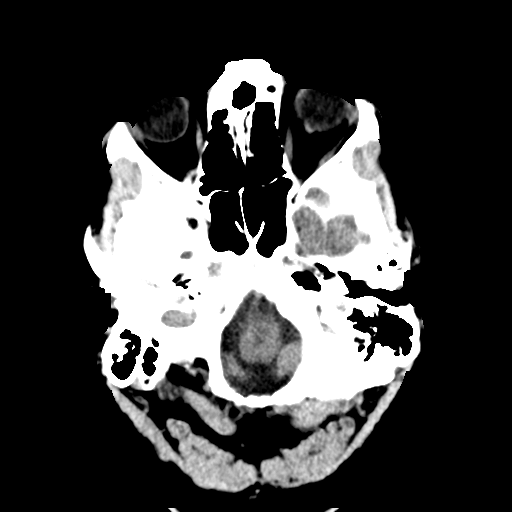
[im 3/32  bone]
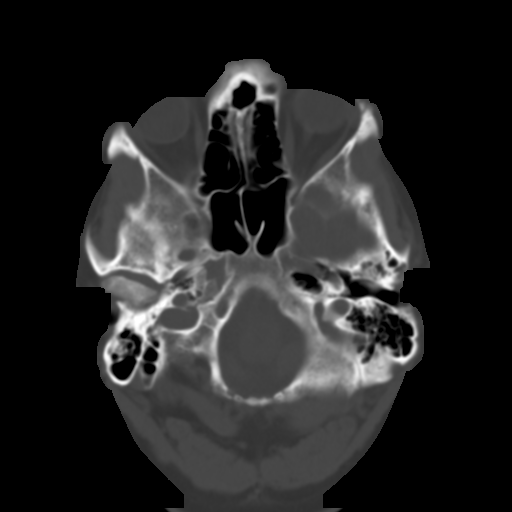
[im 6/32  brain]
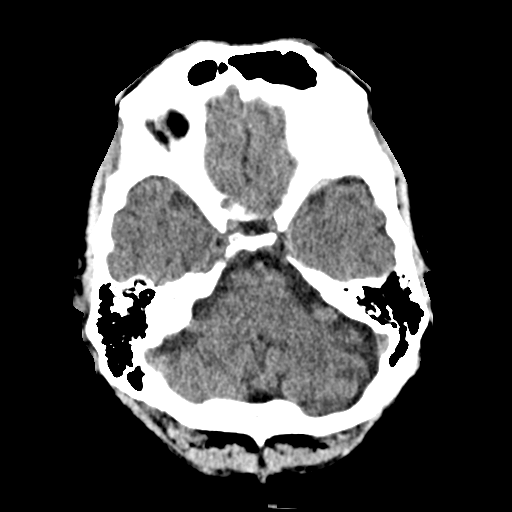
[im 9/32  brain]
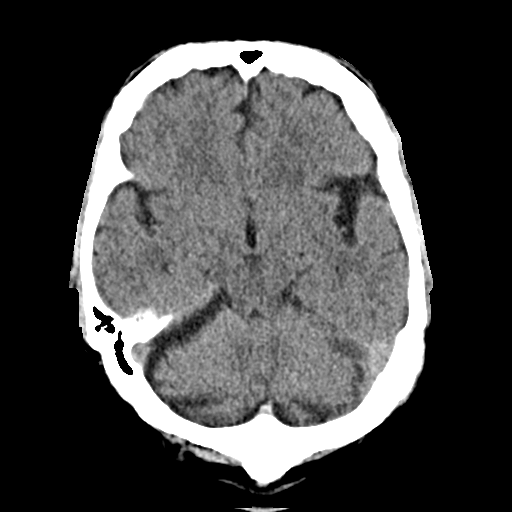
[im 12/32  brain]
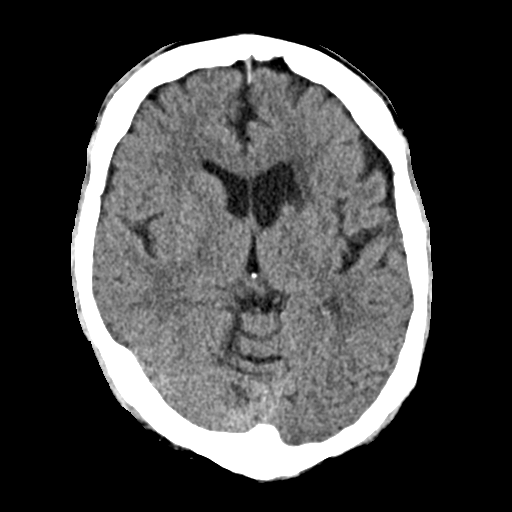
[im 17/32  brain]
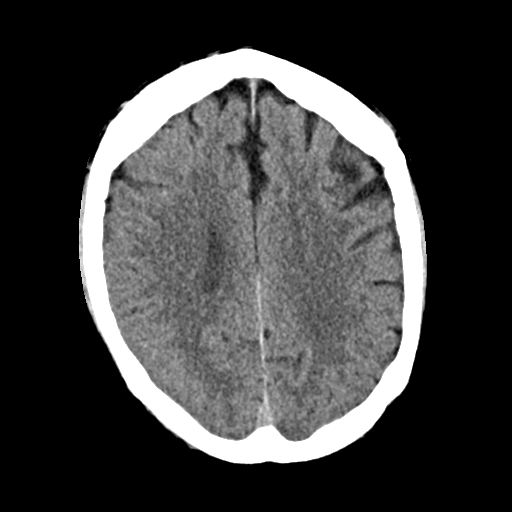
[im 17/32  bone]
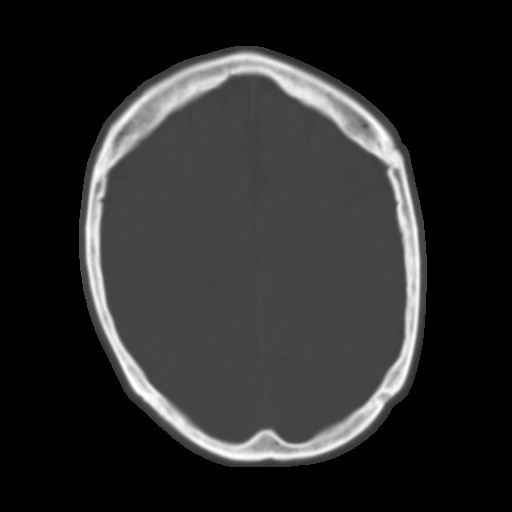
[im 20/32  brain]
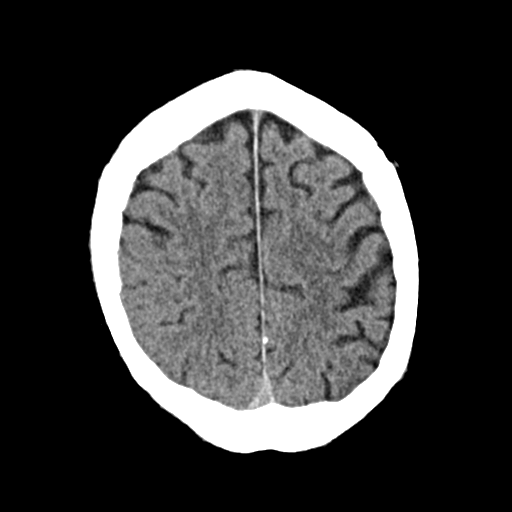
[im 23/32  brain]
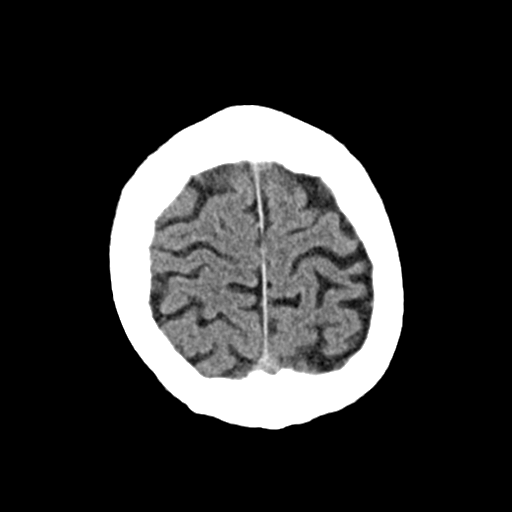
[im 26/32  brain]
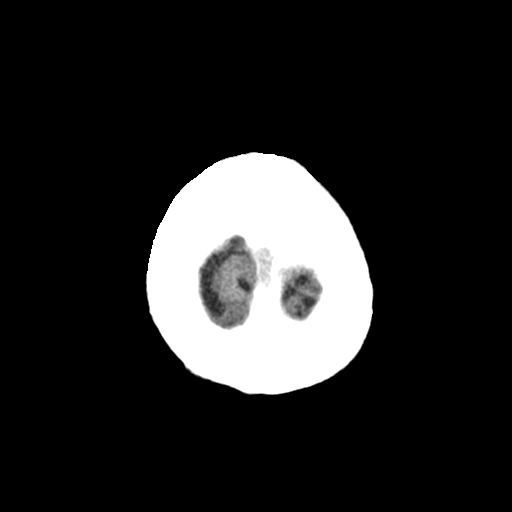
[im 29/32  brain]
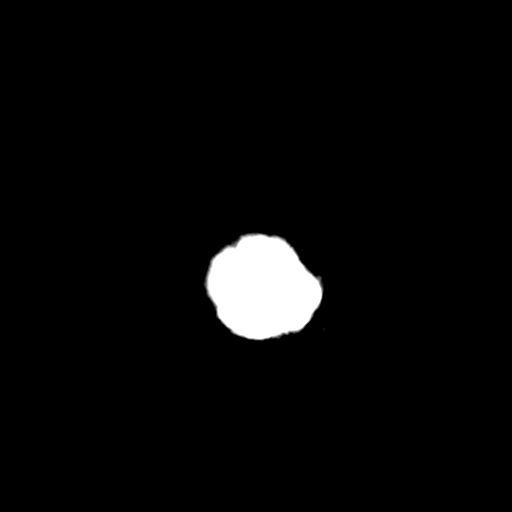
[im 29/32  bone]
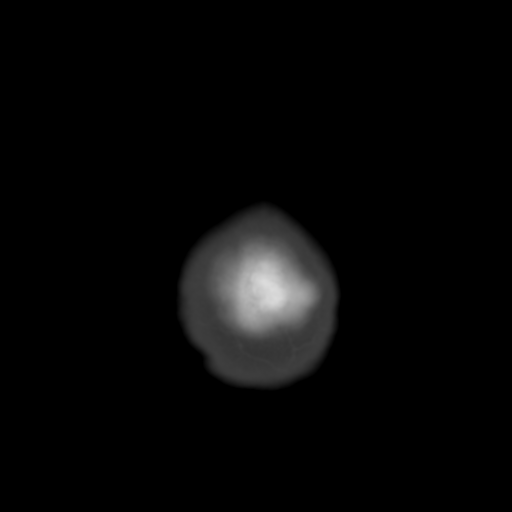

[Series 4: coronal soft · coronal · 0.32mm/px · 3 of 72 slices shown]
[im 24/72  brain]
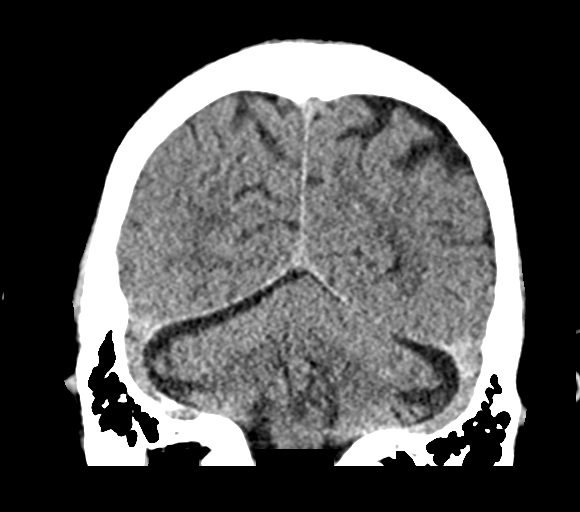
[im 32/72  brain]
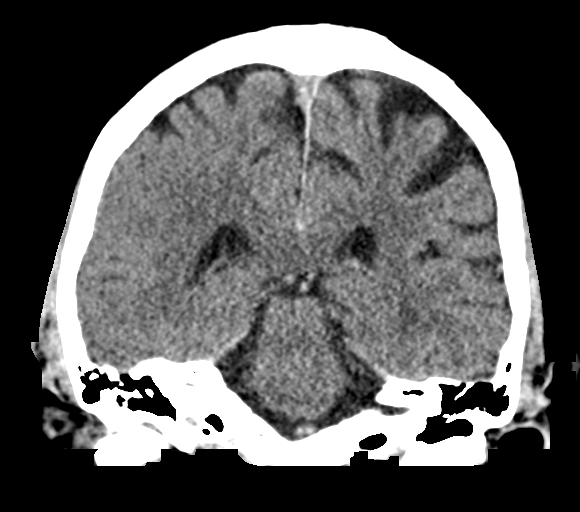
[im 40/72  brain]
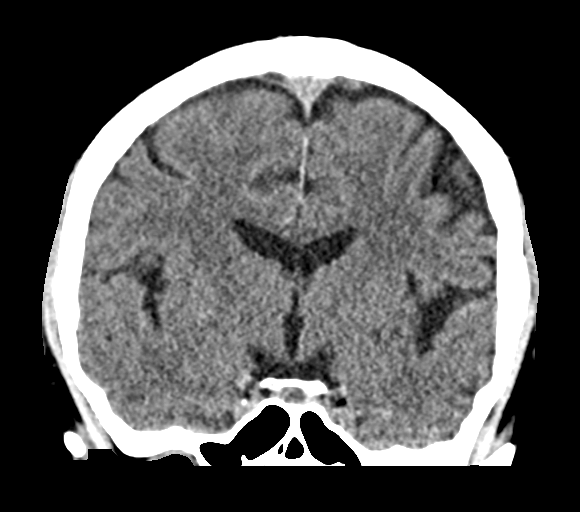

[Series 5: sag soft · sagittal · 0.32mm/px · 3 of 59 slices shown]
[im 20/59  brain]
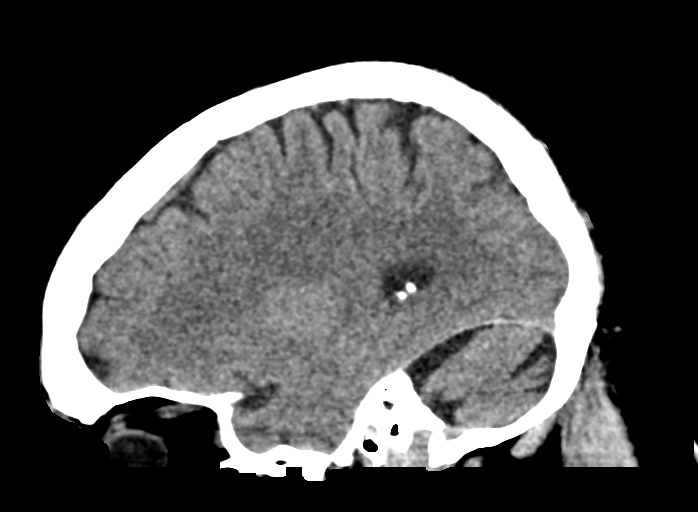
[im 30/59  brain]
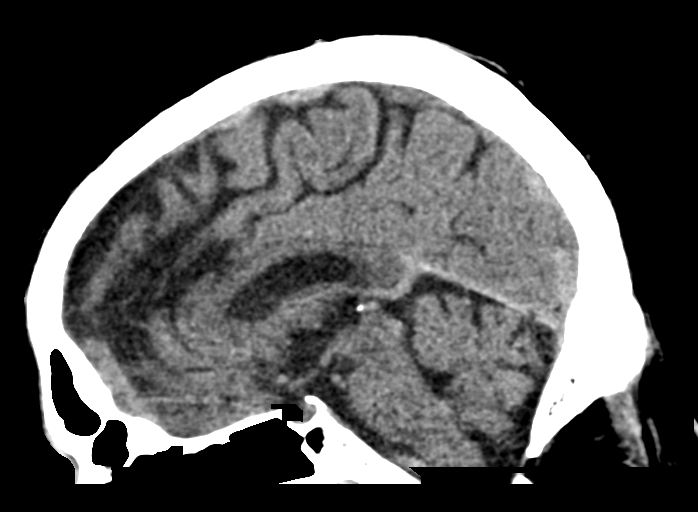
[im 39/59  brain]
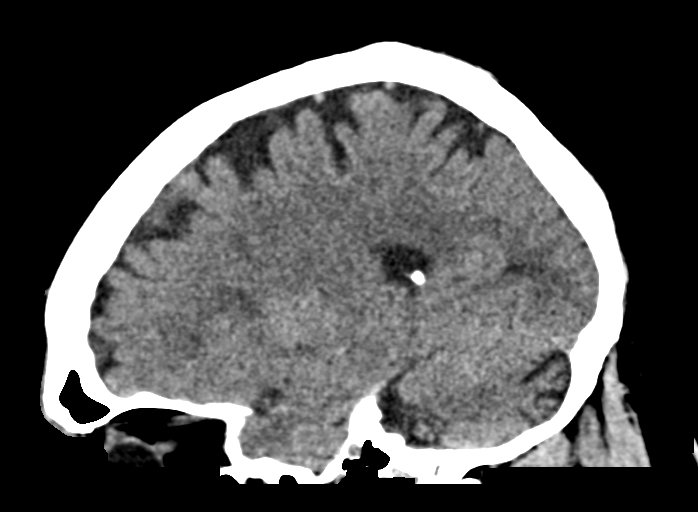

[15 of 47 positions shown; findings below may reference images not displayed]

FINDINGS: Brain: Mild atrophy. Normal ventricular morphology. No midline shift
or mass effect. Old lacunar infarct LEFT caudate head. Mild small
vessel chronic ischemic changes a deep cerebral white matter. No
intracranial hemorrhage, mass lesion or additional infarction.

Vascular: No hyperdense vessels

Skull: Intact

Sinuses/Orbits: Clear

Other: N/A
IMPRESSION: Atrophy with small vessel chronic ischemic changes a deep cerebral
white matter.

Old lacunar infarct LEFT caudate head.

No acute intracranial abnormalities.

## 2022-03-25 MED ORDER — DEXTROSE-NACL 5-0.9 % IV SOLN: INTRAVENOUS | Status: DC

## 2022-03-25 MED ORDER — THIAMINE HCL 100 MG/ML IJ SOLN: 100.0000 mg | Freq: Once | INTRAMUSCULAR | Status: DC

## 2022-03-25 MED ORDER — INSULIN ASPART 100 UNIT/ML IJ SOLN
0.0000 [IU] | Freq: Three times a day (TID) | INTRAMUSCULAR | Status: DC
  Administered 2022-03-26 – 2022-03-27 (×2): 2 [IU] via SUBCUTANEOUS
  Administered 2022-03-27: 3 [IU] via SUBCUTANEOUS
  Administered 2022-03-28: 5 [IU] via SUBCUTANEOUS
  Administered 2022-03-28: 2 [IU] via SUBCUTANEOUS

## 2022-03-25 MED ORDER — THIAMINE HCL 100 MG/ML IJ SOLN
100.0000 mg | Freq: Every day | INTRAMUSCULAR | Status: AC
  Administered 2022-03-26 – 2022-03-27 (×2): 100 mg via INTRAVENOUS
  Filled 2022-03-25 (×2): qty 2

## 2022-03-25 MED ORDER — THIAMINE HCL 100 MG PO TABS
100.0000 mg | ORAL_TABLET | Freq: Every day | ORAL | Status: DC
  Administered 2022-03-28: 100 mg via ORAL
  Filled 2022-03-25: qty 1

## 2022-03-25 MED ORDER — INSULIN ASPART 100 UNIT/ML IJ SOLN
0.0000 [IU] | Freq: Every day | INTRAMUSCULAR | Status: DC
  Administered 2022-03-26 – 2022-03-27 (×2): 2 [IU] via SUBCUTANEOUS

## 2022-03-25 NOTE — H&P (Signed)
?History and Physical  ? ? ?Stanley Stone SWH:[id number] DOB: June 13, 1966 DOA: 03/25/2022 ? ?DOS: the patient was seen and examined on 03/25/2022 ? ?PCP: [name], MD  ? ?Patient coming from: Home pt is homeless ? ?I have personally briefly reviewed patient's old medical records in [place] ? ?CC: right arm weakness ?HPI: ?56 year old white male history of hypertension, type 2 diabetes, alcohol abuse, chronic homelessness presents to the ER today with a 1 week history of right hand weakness.  Patient states that about a week ago he was standing outside the [employer].  He was smoking a cigarette.  His right upper extremity became weak and he dropped a cigarette.  He tried to light several more cigarettes and he was unable to hold his hand up.  Symptoms resolved.  He went back to his tent and went to sleep.  When he got up the next day, the weakness had improved but he still had numbness in his right upper extremity.  Over the course the next week, he has had intermittent weakness. ? ?Patient reports he is blind in his right eye.  He has some blurred vision in his left eye.  These are both chronic. ? ?He states that he called his primary care doctor today who recommended he go to the ER for evaluation. ? ?He initially presented to the [place] ER.  He was transferred to [facility] after his head CT was negative. ? ?MRI of the brain is negative for acute stroke.  It does show chronic lacunar infarcts left basal ganglia and the left frontal lobe.  No acute process.  Chronic small vessel ischemic disease.  He has both cerebellar and cerebral atrophy. ? ?[facility] hospitalist contacted for admission. ?  ? ?ED Course: CT head negative for acute stroke. MRI brain negative for acute/subacute CVA. Shows chronic lacunar infarcts. ? ?Review of Systems:  ?Review of Systems  ?Constitutional: Negative.   ?HENT: Negative.    ?Eyes:   ?     Chronic blindness in right eye  ?Respiratory: Negative.     ?Cardiovascular: Negative.   ?Gastrointestinal: Negative.   ?Genitourinary: Negative.   ?Musculoskeletal: Negative.   ?Skin: Negative.   ?Neurological:  Positive for focal weakness.  ?     Right hand weakness x 1 week.  ?Endo/Heme/Allergies: Negative.   ?Psychiatric/Behavioral: Negative.    ?All other systems reviewed and are negative. ? ?Past Medical History:  ?Diagnosis Date  ? Closed fracture dislocation of lumbar spine ([place]) 1987  ? COPD (chronic obstructive pulmonary disease) ([place])   ? Diabetes mellitus without complication ([place])   ? GERD (gastroesophageal reflux disease)   ? High cholesterol   ? Hypertension   ? ? ?History reviewed. No pertinent surgical history. ? ? reports that he has been smoking cigarettes. He has been smoking an average of .5 packs per day. He has never used smokeless tobacco. He reports current alcohol use. He reports that he does not currently use drugs. ? ?Allergies  ?Allergen Reactions  ? Penicillins   ?  Swell Up   ? ? ?No family history on file. ? ?Prior to Admission medications   ?Medication Sig Start Date End Date Taking? Authorizing Provider  ?amLODipine (NORVASC) 10 MG tablet Take 1 tablet (10 mg total) by mouth daily. 01/12/22  Yes [name], MD  ?atorvastatin (LIPITOR) 40 MG tablet Take 1 tablet (40 mg total) by mouth daily. 01/12/22  Yes [name], MD  ?carvedilol (  COREG) 25 MG tablet Take 1 tablet (25 mg total) by mouth 2 (two) times daily with a meal. 01/18/22  Yes [name], MD  ?fluticasone-salmeterol (ADVAIR HFA) 115-21 MCG/ACT inhaler Inhale 2 puffs into the lungs 2 (two) times daily. 01/18/22  Yes [name], MD  ?hydrOXYzine (ATARAX) 10 MG tablet Take 1 tablet (10 mg total) by mouth 3 (three) times daily as needed. Can use up to 3 tablets (35m) every night at bedtime as needed. (max of 6 tablets daily) 02/16/22  Yes Mayers, Cari S, PA-C  ?Insulin Glargine (BASAGLAR KWIKPEN) 100 UNIT/ML Inject 10 Units into the skin daily. 03/17/22  Yes WElsie Stain MD  ?metFORMIN (GLUCOPHAGE) 500 MG tablet Take 1 tablet (500 mg total) by mouth 2 (two) times daily with a meal. 01/12/22  Yes WElsie Stain MD  ?pantoprazole (PROTONIX) 40 MG tablet Take 1 tablet (40 mg total) by mouth daily. 01/18/22  Yes WElsie Stain MD  ?albuterol (VENTOLIN HFA) 108 (90 Base) MCG/ACT inhaler Inhale 2 puffs into the lungs every 6 (six) hours as needed for wheezing or shortness of breath. 01/18/22   WElsie Stain MD  ?Blood Glucose Monitoring Suppl (TRUE METRIX METER) w/Device KIT Use as directed daily to check blood sugar 01/18/22   WElsie Stain MD  ?glucose blood (TRUE METRIX BLOOD GLUCOSE TEST) test strip Use as instructed 03/17/22   WElsie Stain MD  ?Insulin Pen Needle (UNIFINE PENTIPS) 31G X 8 MM MISC Use with insulin pen 02/14/22   WElsie Stain MD  ?lisinopril-hydrochlorothiazide (ZESTORETIC) 20-25 MG tablet Take 1 tablet by mouth daily. 01/12/22   WElsie Stain MD  ?TRUEplus Lancets 28G MISC Check blood sugar daily 03/17/22   WElsie Stain MD  ? ? ?Physical Exam: ?Vitals:  ? 03/25/22 1328 03/25/22 1508 03/25/22 1642 03/25/22 1939  ?BP: 133/81 133/88 134/84 133/77  ?Pulse: 74 80 72 83  ?Resp: _0 ?Temp: 98.2 ?F (36.8 ?C) 97.8 ?F (36.6 ?C) 98.6 ?F (37 ?C) 100 ?F (37.8 ?C)  ?TempSrc: Oral Oral Oral Oral  ?SpO2: 93% 96% 100% 93%  ?Weight:      ?Height:      ? ? ?Physical Exam ?Vitals and nursing note reviewed.  ?Constitutional:   ?   General: He is not in acute distress. ?   Appearance: He is not toxic-appearing or diaphoretic.  ?   Comments: Chronically ill appearing  ?HENT:  ?   Head: Normocephalic and atraumatic.  ?   Nose: Nose normal. No rhinorrhea.  ?Cardiovascular:  ?   Rate and Rhythm: Normal rate and regular rhythm.  ?   Pulses: Normal pulses.  ?Pulmonary:  ?   Effort: Pulmonary effort is normal. No respiratory distress.  ?   Breath sounds: Normal breath sounds. No wheezing or rales.  ?Abdominal:  ?   General: Bowel sounds are normal. There  is no distension.  ?   Palpations: Abdomen is soft. There is no mass.  ?   Tenderness: There is no abdominal tenderness. There is no guarding.  ?Musculoskeletal:  ?   Right lower leg: No edema.  ?   Left lower leg: No edema.  ?Skin: ?   General: Skin is warm and dry.  ?   Capillary Refill: Capillary refill takes less than 2 seconds.  ?Neurological:  ?   Mental Status: He is alert and oriented to person, place, and time.  ?   Sensory: No sensory deficit.  ?  Gait: Gait normal.  ?   Deep Tendon Reflexes:  ?   Reflex Scores: ?     Patellar reflexes are 2+ on the right side and 2+ on the left side. ?   Comments: +4/5 right hand grip strength ?5/5 left hand grip strength ? ?Pt able to ambulate about 25 feet without assistance.  ?  ? ?Labs on Admission: I have personally reviewed following labs and imaging studies ? ?CBC: ?Recent Labs  ?Lab 03/25/22 ?1147  ?WBC 10.0  ?NEUTROABS 6.8  ?HGB 13.8  ?HCT 41.3  ?MCV 90.6  ?PLT 254  ? ?Basic Metabolic Panel: ?Recent Labs  ?Lab 03/25/22 ?1147  ?NA 139  ?K 3.8  ?CL 100  ?CO2 30  ?GLUCOSE 226*  ?BUN 24*  ?CREATININE 1.26*  ?CALCIUM 9.5  ? ?GFR: ?Estimated Creatinine Clearance: 64.2 mL/min (A) (by C-G formula based on SCr of 1.26 mg/dL (H)). ?Liver Function Tests: ?Recent Labs  ?Lab 03/25/22 ?1147  ?AST 20  ?ALT 26  ?ALKPHOS 83  ?BILITOT 0.9  ?PROT 7.3  ?ALBUMIN 4.0  ? ?No results for input(s): LIPASE, AMYLASE in the last 168 hours. ?No results for input(s): AMMONIA in the last 168 hours. ?Coagulation Profile: ?No results for input(s): INR, PROTIME in the last 168 hours. ?Cardiac Enzymes: ?Recent Labs  ?Lab 03/25/22 ?1147  ?TROPONINIHS 4  ? ?BNP (last 3 results) ?No results for input(s): PROBNP in the last 8760 hours. ?HbA1C: ?No results for input(s): HGBA1C in the last 72 hours. ?CBG: ?Recent Labs  ?Lab 03/25/22 ?1134  ?GLUCAP 237*  ? ?Lipid Profile: ?No results for input(s): CHOL, HDL, LDLCALC, TRIG, CHOLHDL, LDLDIRECT in the last 72 hours. ?Thyroid Function Tests: ?No results for  input(s): TSH, T4TOTAL, FREET4, T3FREE, THYROIDAB in the last 72 hours. ?Anemia Panel: ?No results for input(s): VITAMINB12, FOLATE, FERRITIN, TIBC, IRON, RETICCTPCT in the last 72 hours. ?Urine analysi

## 2022-03-25 NOTE — ED Provider Notes (Signed)
Patient is transferred from [facility] emergency department for further evaluation including MRI. ? ?I reviewed Dr. Will Bonnet note. ? ?The patient advises that the main symptom he is concerned about is dysfunction in his right arm.  He reports that it stopped working sometime earlier today and that he dropped a cigarette multiple times.  Patient reports he does drink alcohol last alcohol reportedly 5 days ago. ?Physical Exam  ?BP 133/77 (BP Location: Right Arm)   Pulse 83   Temp 100 ?F (37.8 ?C) (Oral)   Resp 18   Ht 5\' 5"  (1.651 m)   Wt 80.9 kg   SpO2 93%   BMI 29.69 kg/m?  ? ?Physical Exam ?Constitutional:   ?   Comments: Patient is alert.  No respiratory distress.  ?HENT:  ?   Mouth/Throat:  ?   Comments: Very poor dentition.  Airway is protected.  No difficulty handling secretions or breathing. ?Cardiovascular:  ?   Rate and Rhythm: Normal rate and regular rhythm.  ?Pulmonary:  ?   Effort: Pulmonary effort is normal.  ?   Breath sounds: Normal breath sounds.  ?Musculoskeletal:     ?   General: No swelling.  ?   Right lower leg: No edema.  ?   Left lower leg: No edema.  ?Skin: ?   General: Skin is warm and dry.  ?Neurological:  ?   Comments: Patient speech is slightly difficult to understand.  However he does not seem to have expressive or receptive aphasia.  Some weakness on the right grip compared to left.  Ataxia on the right with holding the arms in extension and supination.  Also ataxia of the right lower extremity when holding suspended.  Patient is able to elevate both extremities at will.  ?Psychiatric:     ?   Mood and Affect: Mood normal.  ? ? ?Procedures  ?Procedures ? ?ED Course / MDM  ?  ?Medical Decision Making ?Amount and/or Complexity of Data Reviewed ?Labs: ordered. ?Radiology: ordered. ? ?Risk ?Prescription drug management. ?Decision regarding hospitalization. ? ? ?Consult: [facility] hospitalist Dr. Bridgett Larsson for admission. ?Consult: Dr. Carola Rhine neurology.  Will consult on the patient as  well. ? ?Patient's MRI shows various areas of infarct although not listed as acute or subacute.  Patient denies any known history of CVA.  Combined with findings of right lower extremity ataxia we will plan for admission for stroke evaluation.  Patient also does have history of alcohol use and consideration given to Warnicke's encephalopathy as well.  Will initiate thiamine and obtain ischial lab work. ? ?  ?Charlesetta Shanks, MD ?03/25/22 2129 ? ?

## 2022-03-25 NOTE — ED Notes (Signed)
Patient transported to CT 

## 2022-03-25 NOTE — ED Notes (Addendum)
Pt transferred from [facility] for complaints of weakness in his right leg that started one week ago.  ?

## 2022-03-25 NOTE — Assessment & Plan Note (Signed)
Admit to med/tele obs bed. Check echo. MRI brain negative for acute/subacute CVA.  EDP has consulted neurology. They can order any additional testing. ?

## 2022-03-25 NOTE — ED Triage Notes (Signed)
States has been having slurred speech and right arm numbness as well x 1 week. Speech clear at present  ?

## 2022-03-25 NOTE — ED Provider Notes (Signed)
?[facility] EMERGENCY DEPARTMENT ?Provider Note ? ? ?CSN: [id number] ?Arrival date & time: 03/25/22  1119 ? ?  ? ?History ? ?Chief Complaint  ?Patient presents with  ? Altered Mental Status  ? ? ?Stanley Stone is a 56 y.o. male. ? ?HPI ? ?  ? ?56 year old male with history of COPD, diabetes, hypertension, hyperlipidemia, history of alcohol abuse, smoking, presents with concern for right-sided numbness, weakness, difficulty speaking for 1 week. ? ?Reports that symptoms began 1 week ago and have remained the same since they started.  Describes right-sided arm numbness and weakness, as well as right-sided facial numbness, and some difficulty finding words.  He is blind in his right eye, reports some feeling of blurred vision in the left eye, but denies double vision.  He has had some dizziness that feels more like lightheadedness.  Denies facial droop, leg symptoms.  Denies chest pain, shortness of breath, headache, nausea, vomiting, abdominal pain.  He has a history of prior alcohol withdrawal, reports that he used to drink heavily, but has not had anything to drink for the last 4 days.  He does not feel that he has had withdrawal symptoms.  He called his primary care doctor regarding his symptoms who recommended he come to the emergency department. ? ?Past Medical History:  ?Diagnosis Date  ? Closed fracture dislocation of lumbar spine ([place]) 1987  ? COPD (chronic obstructive pulmonary disease) ([place])   ? Diabetes mellitus without complication ([place])   ? GERD (gastroesophageal reflux disease)   ? High cholesterol   ? Hypertension   ? ? ? ?Home Medications ?Prior to Admission medications   ?Medication Sig Start Date End Date Taking? Authorizing Provider  ?amLODipine (NORVASC) 10 MG tablet Take 1 tablet (10 mg total) by mouth daily. 01/12/22  Yes [name], MD  ?atorvastatin (LIPITOR) 40 MG tablet Take 1 tablet (40 mg total) by mouth daily. 01/12/22  Yes [name], MD  ?carvedilol (COREG) 25 MG  tablet Take 1 tablet (25 mg total) by mouth 2 (two) times daily with a meal. 01/18/22  Yes [name], MD  ?fluticasone-salmeterol (ADVAIR HFA) 115-21 MCG/ACT inhaler Inhale 2 puffs into the lungs 2 (two) times daily. 01/18/22  Yes [name], MD  ?hydrOXYzine (ATARAX) 10 MG tablet Take 1 tablet (10 mg total) by mouth 3 (three) times daily as needed. Can use up to 3 tablets (14m) every night at bedtime as needed. (max of 6 tablets daily) 02/16/22  Yes Mayers, Cari S, PA-C  ?Insulin Glargine (BASAGLAR KWIKPEN) 100 UNIT/ML Inject 10 Units into the skin daily. 03/17/22  Yes WElsie Stain MD  ?metFORMIN (GLUCOPHAGE) 500 MG tablet Take 1 tablet (500 mg total) by mouth 2 (two) times daily with a meal. 01/12/22  Yes WElsie Stain MD  ?pantoprazole (PROTONIX) 40 MG tablet Take 1 tablet (40 mg total) by mouth daily. 01/18/22  Yes WElsie Stain MD  ?albuterol (VENTOLIN HFA) 108 (90 Base) MCG/ACT inhaler Inhale 2 puffs into the lungs every 6 (six) hours as needed for wheezing or shortness of breath. 01/18/22   WElsie Stain MD  ?Blood Glucose Monitoring Suppl (TRUE METRIX METER) w/Device KIT Use as directed daily to check blood sugar 01/18/22   WElsie Stain MD  ?glucose blood (TRUE METRIX BLOOD GLUCOSE TEST) test strip Use as instructed 03/17/22   WElsie Stain MD  ?Insulin Pen Needle (UNIFINE PENTIPS) 31G X 8 MM MISC Use with insulin pen 02/14/22  [name], MD  ?lisinopril-hydrochlorothiazide (ZESTORETIC) 20-25 MG tablet Take 1 tablet by mouth daily. 01/12/22   [name], MD  ?TRUEplus Lancets 28G MISC Check blood sugar daily 03/17/22   [name], MD  ?   ? ?Allergies    ?Penicillins   ? ?Review of Systems   ?Review of Systems ? ?Physical Exam ?Updated Vital Signs ?BP 133/81 (BP Location: Left Arm)   Pulse 74   Temp 98.2 ?F (36.8 ?C) (Oral)   Resp 18   Ht 5' 5" (1.651 m)   Wt 80.9 kg   SpO2 93%   BMI 29.69 kg/m?  ?Physical Exam ?Vitals and nursing note reviewed.   ?Constitutional:   ?   General: He is not in acute distress. ?   Appearance: He is well-developed. He is not diaphoretic.  ?HENT:  ?   Head: Normocephalic and atraumatic.  ?Eyes:  ?   Conjunctiva/sclera: Conjunctivae normal.  ?   Comments: Dysconjugate gaze  ?Cardiovascular:  ?   Rate and Rhythm: Normal rate and regular rhythm.  ?   Heart sounds: Normal heart sounds. No murmur heard. ?  No friction rub. No gallop.  ?Pulmonary:  ?   Effort: Pulmonary effort is normal. No respiratory distress.  ?   Breath sounds: Normal breath sounds. No wheezing or rales.  ?Abdominal:  ?   General: There is no distension.  ?   Palpations: Abdomen is soft.  ?   Tenderness: There is no abdominal tenderness. There is no guarding.  ?Musculoskeletal:  ?   Cervical back: Normal range of motion.  ?Skin: ?   General: Skin is warm and dry.  ?Neurological:  ?   Mental Status: He is alert and oriented to person, place, and time.  ?   Comments: Some mild aphasia/hesitancy with naming things, repeating "no ifs ands or buts" ?Mild RUE weakness, no significant drift ?Numbness right face, arm, leg ?  ? ? ?ED Results / Procedures / Treatments   ?Labs ?(all labs ordered are listed, but only abnormal results are displayed) ?Labs Reviewed  ?COMPREHENSIVE METABOLIC PANEL - Abnormal; Notable for the following components:  ?    Result Value  ? Glucose, Bld 226 (*)   ? BUN 24 (*)   ? Creatinine, Ser 1.26 (*)   ? All other components within normal limits  ?CBG MONITORING, ED - Abnormal; Notable for the following components:  ? Glucose-Capillary 237 (*)   ? All other components within normal limits  ?CBC WITH DIFFERENTIAL/PLATELET  ?TROPONIN I (HIGH SENSITIVITY)  ? ? ?EKG ?EKG Interpretation ? ?Date/Time:  Friday Mar 25 2022 11:29:37 EDT ?Ventricular Rate:  78 ?PR Interval:  180 ?QRS Duration: 108 ?QT Interval:  379 ?QTC Calculation: 432 ?R Axis:   59 ?Text Interpretation: Sinus rhythm Borderline ST elevation, anterior leads No previous ECGs available  Confirmed by Gareth Morgan [phone]) on 03/25/2022 1:29:44 PM ? ?Radiology ?CT Head Wo Contrast ? ?Result Date: 03/25/2022 ?CLINICAL DATA:  Neurological deficit, slurred speech and RIGHT arm numbness for 1 week, question stroke EXAM: CT HEAD WITHOUT CONTRAST TECHNIQUE: Contiguous axial images were obtained from the base of the skull through the vertex without intravenous contrast. RADIATION DOSE REDUCTION: This exam was performed according to the departmental dose-optimization program which includes automated exposure control, adjustment of the mA and/or kV according to patient size and/or use of iterative reconstruction technique. COMPARISON:  None Available. FINDINGS: Brain: Mild atrophy. Normal ventricular morphology. No midline shift or mass effect. Old lacunar  infarct LEFT caudate head. Mild small vessel chronic ischemic changes a deep cerebral white matter. No intracranial hemorrhage, mass lesion or additional infarction. Vascular: No hyperdense vessels Skull: Intact Sinuses/Orbits: Clear Other: N/A IMPRESSION: Atrophy with small vessel chronic ischemic changes a deep cerebral white matter. Old lacunar infarct LEFT caudate head. No acute intracranial abnormalities. Electronically Signed   By: Mark  Boles M.D.   On: 03/25/2022 12:03   ? ?Procedures ?Procedures  ? ? ?Medications Ordered in ED ?Medications - No data to display ? ?ED Course/ Medical Decision Making/ A&P ?  ?                        ?Medical Decision Making ?Amount and/or Complexity of Data Reviewed ?Labs: ordered. ?Radiology: ordered. ? ? ?55-year-old male with history of COPD, diabetes, hypertension, hyperlipidemia, history of alcohol abuse, smoking, presents with concern for right-sided numbness, weakness, difficulty speaking for 1 week. ? ?DDx includes CVA, ICH, electrolyte abnormality.  ? ?Labs completed and personally interpreted by me show hyperglycemia without other acute abnormalities.  There is no anemia.  Given right-sided arm symptoms,  troponin was also evaluated which was negative and have low suspicion for ACS. ? ?EKG was completed and personally evaluated interpreted by me and showed no evidence of acute arrhythmia. ? ?CT head was personally interpreted by

## 2022-03-25 NOTE — Assessment & Plan Note (Signed)
Continue norvasc 10 mg, coreg 25 mg, ACE/HCTZ. ?

## 2022-03-25 NOTE — ED Notes (Signed)
Thayer Ohm (son) of [employer] called asking for an update. His number is [phone]. ?

## 2022-03-25 NOTE — Congregational Nurse Program (Signed)
?  Dept: [phone] ? ? ?Congregational Nurse Program Note ? ?Date of Encounter: 03/18/2022 ? ?Past Medical History: ?Past Medical History:  ?Diagnosis Date  ? Closed fracture dislocation of lumbar spine ([place]) 1987  ? Diabetes mellitus without complication ([place])   ? Hypertension   ? ? ?Encounter Details: Met with client at [facility] to drop off medications. CCN advised client that he would need to complete manufacturers application (provided) for his albuterol inhaler. Client states that he has been testing his blood sugars regularly in the morning at that they range from 150-225. He continues to use his insulin pen and take his metformin. He has been able to get his blood pressure checked by a [employer] and says his pressures have been "much improved". He states he feels better. CCN asks if he has been able to connect with any case management for housing and disability. He states he is working with agencies that visit [employer]. CCN advised client that Evern Core RN would bring the remainder of his testing supplies next Friday. She will speak with him regarding a follow up appointment with Dr Joya Gaskins. ?Lisette Abu BSN RN CCNP ?[phone] ? ? ? ? ?

## 2022-03-25 NOTE — Assessment & Plan Note (Signed)
Chronically homeless. Pt lives in a tent. ?

## 2022-03-25 NOTE — Assessment & Plan Note (Signed)
Stable. Check A1c. Continue lantus. Add SSI. ?

## 2022-03-25 NOTE — Subjective & Objective (Signed)
CC: right arm weakness ?HPI: ?56 year old white male history of hypertension, type 2 diabetes, alcohol abuse, chronic homelessness presents to the ER today with a 1 week history of right hand weakness.  Patient states that about a week ago he was standing outside the [employer].  He was smoking a cigarette.  His right upper extremity became weak and he dropped a cigarette.  He tried to light several more cigarettes and he was unable to hold his hand up.  Symptoms resolved.  He went back to his tent and went to sleep.  When he got up the next day, the weakness had improved but he still had numbness in his right upper extremity.  Over the course the next week, he has had intermittent weakness. ? ?Patient reports he is blind in his right eye.  He has some blurred vision in his left eye.  These are both chronic. ? ?He states that he called his primary care doctor today who recommended he go to the ER for evaluation. ? ?He initially presented to the [facility] ER.  He was transferred to [facility] after his head CT was negative. ? ?MRI of the brain is negative for acute stroke.  It does show chronic lacunar infarcts left basal ganglia and the left frontal lobe.  No acute process.  Chronic small vessel ischemic disease.  He has both cerebellar and cerebral atrophy. ? ?[facility] hospitalist contacted for admission. ? ?

## 2022-03-25 NOTE — ED Notes (Signed)
Report given to [facility]el RN with [facility]  

## 2022-03-25 NOTE — ED Notes (Signed)
Report called to [facility] RN, CN at [facility] ?

## 2022-03-25 NOTE — Congregational Nurse Program (Signed)
?  Dept: [phone] ? ? ?Congregational Nurse Program Note ? ?Date of Encounter: 03/25/2022 ? ?Past Medical History: ?Past Medical History:  ?Diagnosis Date  ? Closed fracture dislocation of lumbar spine (HCC) 1987  ? Diabetes mellitus without complication (HCC)   ? Hypertension   ? ? ?Encounter Details: ? CNP Questionnaire - 03/25/22 1052   ? ?  ? Questionnaire  ? Do you give verbal consent to treat you today? Yes   ? Location Patient Served  Not Applicable   ? Visit Setting Other   ? Patient Status Homeless   ? Insurance Uninsured ([employer]/Self-Pay)   ? Insurance Referral N/A   ? Medication Have Medication Insecurities   ? Medical Provider Yes   ? Screening Referrals N/A   ? Medical Referral ED   ? Medical Appointment Made N/A   ? Food Have Food Insecurities   ? Transportation Need transportation assistance;Provided transportation assistance   ? Housing/Utilities No permanent housing   ? Interpersonal Safety N/A   ? Intervention Counsel;Support   ? ED Visit Averted N/A   ? Life-Saving Intervention Made Yes   ? ?  ?  ? ?  ? ?Spoke with Bartholome Bill CCNP- she visited client at [employer]. Client told Mariel Aloe that had been having "problems speaking" and unilateral arm numbness intermittently. CCN spoke with client's provider Dr Jonni Sanger, regarding and he advised client go to ED for evaluation of symptoms given client risk factors for stroke. CCN spoke with client via phone- advised evaluation at [facility]. Benedetto Goad transportation arranged for client. Bartholome Bill RN to follow up with client in ED this afternoon. ?Shann Medal BSN RN CCNP ?[phone] ? ? ?

## 2022-03-25 NOTE — Assessment & Plan Note (Signed)
Pt has not had any alcohol in 5 days. Start thiamine. ?

## 2022-03-25 NOTE — Assessment & Plan Note (Signed)
Smoke cigarettes. Denies drug use. UDS pending. ?

## 2022-03-25 NOTE — Assessment & Plan Note (Signed)
Stable

## 2022-03-26 ENCOUNTER — Observation Stay (HOSPITAL_COMMUNITY): Payer: Medicaid Other

## 2022-03-26 DIAGNOSIS — F1011 Alcohol abuse, in remission: Secondary | ICD-10-CM

## 2022-03-26 DIAGNOSIS — I6602 Occlusion and stenosis of left middle cerebral artery: Secondary | ICD-10-CM

## 2022-03-26 DIAGNOSIS — R29898 Other symptoms and signs involving the musculoskeletal system: Secondary | ICD-10-CM

## 2022-03-26 DIAGNOSIS — M4802 Spinal stenosis, cervical region: Secondary | ICD-10-CM

## 2022-03-26 DIAGNOSIS — I6601 Occlusion and stenosis of right middle cerebral artery: Secondary | ICD-10-CM

## 2022-03-26 DIAGNOSIS — I10 Essential (primary) hypertension: Secondary | ICD-10-CM

## 2022-03-26 DIAGNOSIS — J449 Chronic obstructive pulmonary disease, unspecified: Secondary | ICD-10-CM

## 2022-03-26 DIAGNOSIS — Z794 Long term (current) use of insulin: Secondary | ICD-10-CM

## 2022-03-26 DIAGNOSIS — Z720 Tobacco use: Secondary | ICD-10-CM

## 2022-03-26 DIAGNOSIS — Z5900 Homelessness unspecified: Secondary | ICD-10-CM

## 2022-03-26 DIAGNOSIS — E1165 Type 2 diabetes mellitus with hyperglycemia: Secondary | ICD-10-CM

## 2022-03-26 LAB — LIPID PANEL
Cholesterol: 153 mg/dL (ref 0–200)
HDL: 36 mg/dL — ABNORMAL LOW (ref >40)
LDL Cholesterol: 76 mg/dL (ref 0–99)
Total CHOL/HDL Ratio: 4.3 RATIO
Triglycerides: 206 mg/dL — ABNORMAL HIGH (ref <150)
VLDL: 41 mg/dL — ABNORMAL HIGH (ref 0–40)

## 2022-03-26 LAB — HIV ANTIBODY (ROUTINE TESTING W REFLEX): HIV Screen 4th Generation wRfx: NONREACTIVE

## 2022-03-26 LAB — GLUCOSE, CAPILLARY
Glucose-Capillary: 112 mg/dL — ABNORMAL HIGH (ref 70–99)
Glucose-Capillary: 239 mg/dL — ABNORMAL HIGH (ref 70–99)

## 2022-03-26 LAB — HEMOGLOBIN A1C
Hgb A1c MFr Bld: 8.2 % — ABNORMAL HIGH (ref 4.8–5.6)
Mean Plasma Glucose: 188.64 mg/dL

## 2022-03-26 LAB — CBG MONITORING, ED
Glucose-Capillary: 100 mg/dL — ABNORMAL HIGH (ref 70–99)
Glucose-Capillary: 147 mg/dL — ABNORMAL HIGH (ref 70–99)
Glucose-Capillary: 176 mg/dL — ABNORMAL HIGH (ref 70–99)

## 2022-03-26 IMAGING — MR MR MRA HEAD W/O CM
1 series · 19 of 48 positions shown · non-contrast
Comparison: Brain MRI from yesterday

CLINICAL DATA: Neuro deficit.  Right arm weakness.

EXAM:
MRA HEAD WITHOUT CONTRAST
TECHNIQUE: Angiographic images of the Circle of Willis were acquired using MRA
technique without intravenous contrast.

[Series 5: 3d cow · axial · 0.5mm · 0.41mm/px · z∈[-58,+18]mm · 19 of 160 slices shown]
[im 1/160]
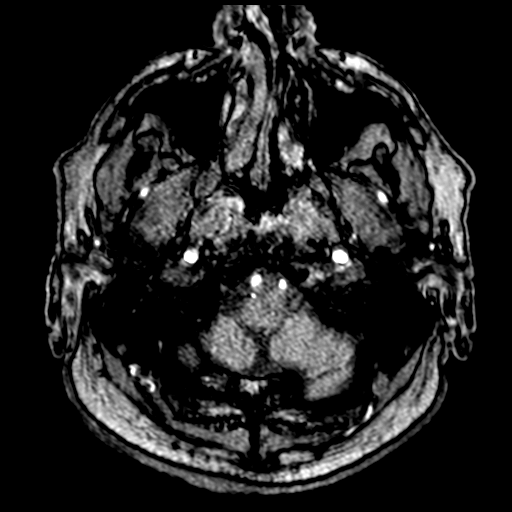
[im 4/160]
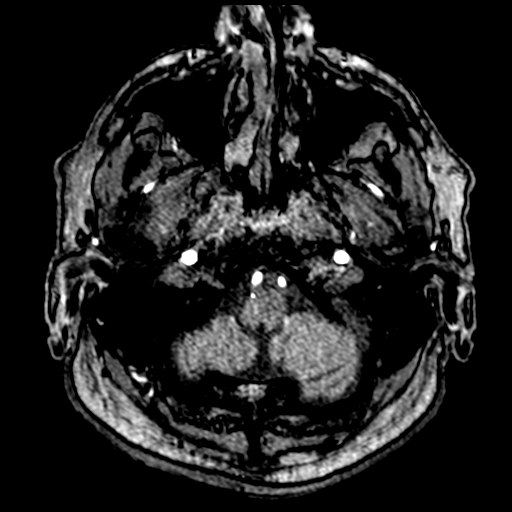
[im 7/160]
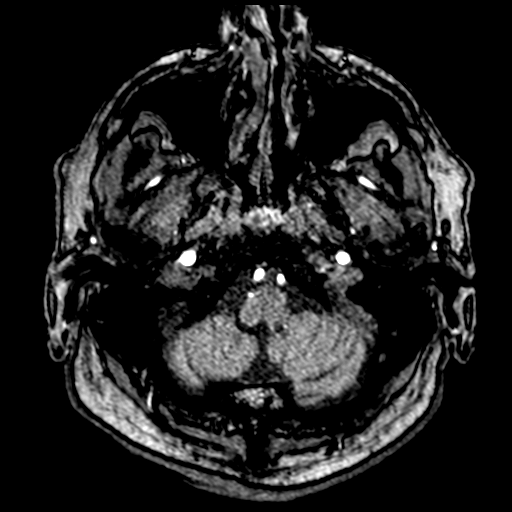
[im 11/160]
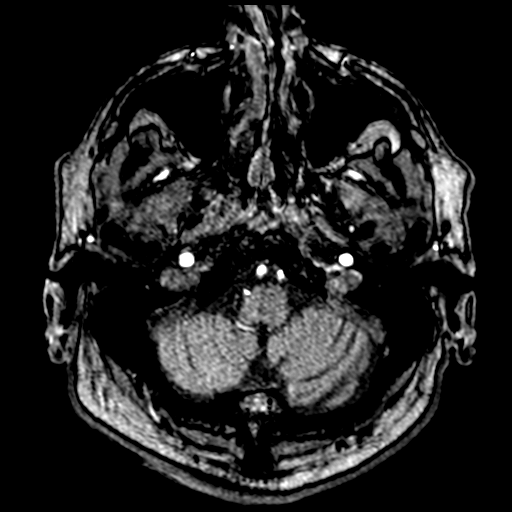
[im 14/160]
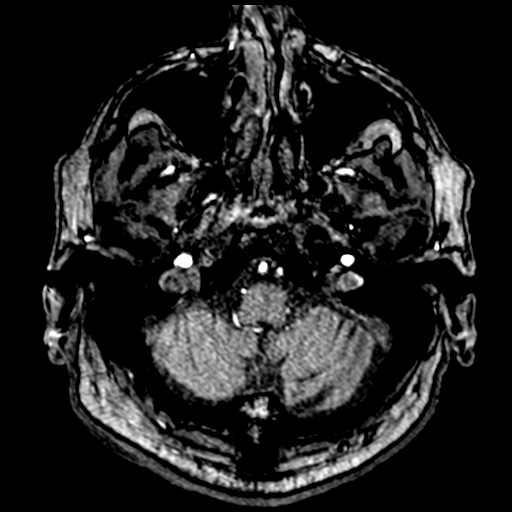
[im 17/160]
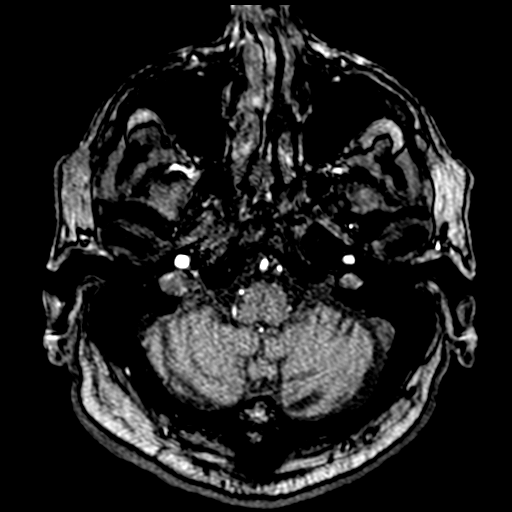
[im 21/160]
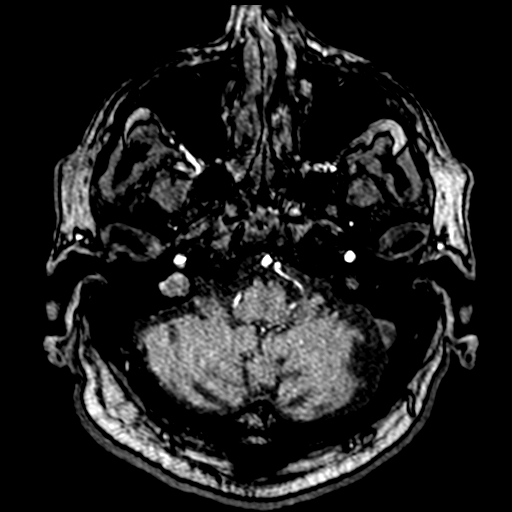
[im 24/160]
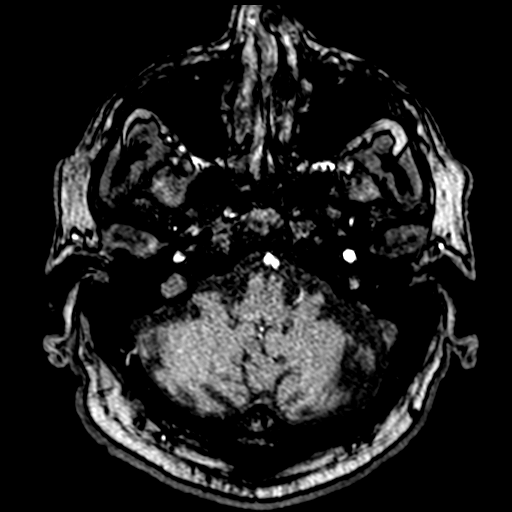
[im 28/160]
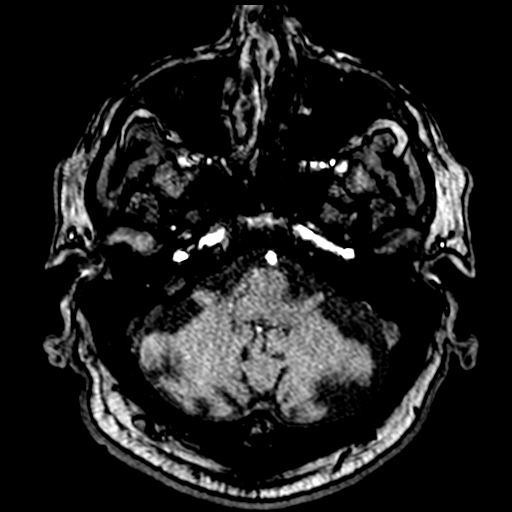
[im 31/160]
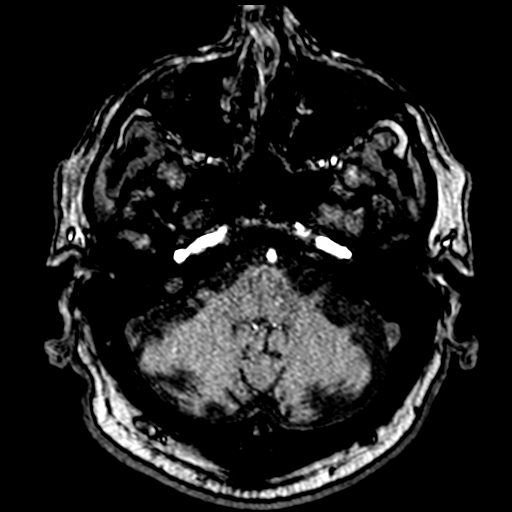
[im 34/160]
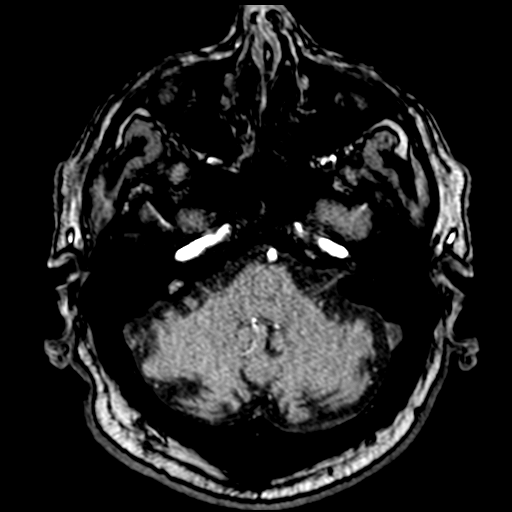
[im 51/160]
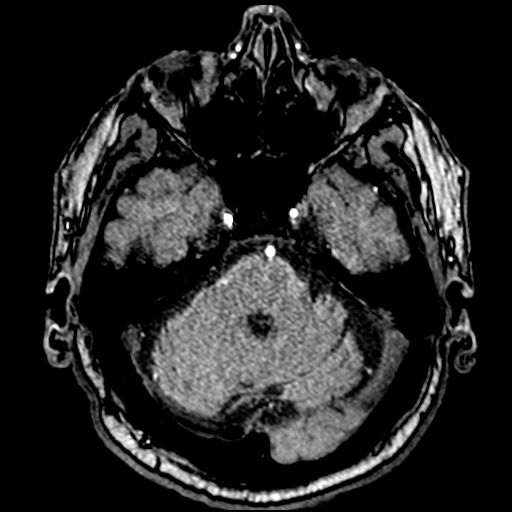
[im 72/160]
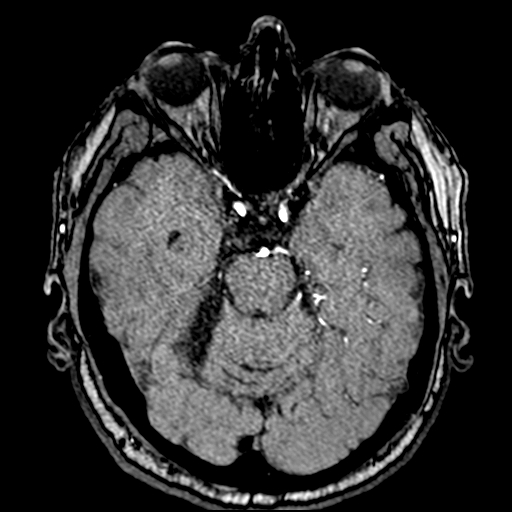
[im 82/160]
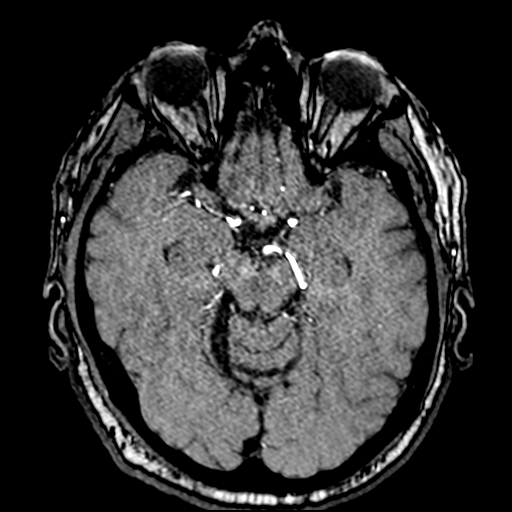
[im 92/160]
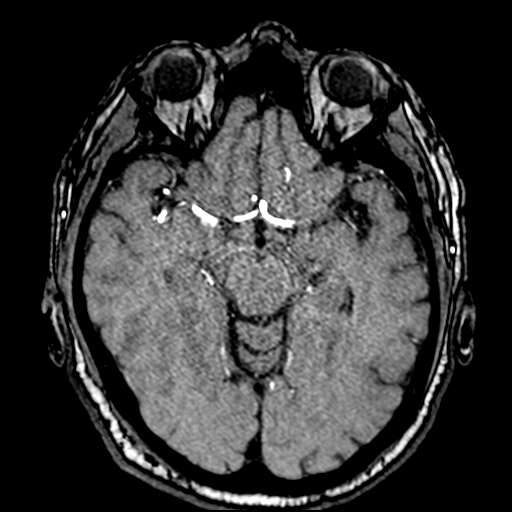
[im 112/160]
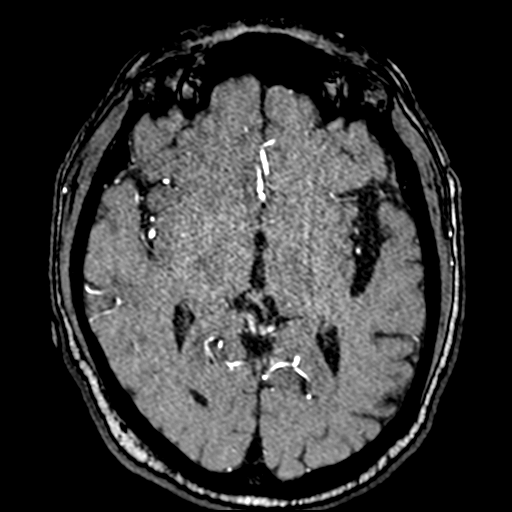
[im 132/160]
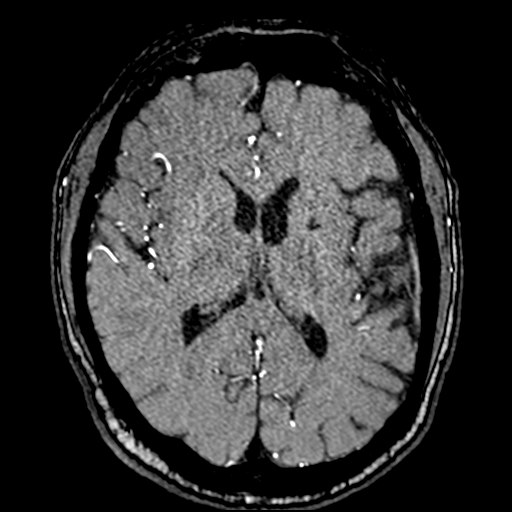
[im 136/160]
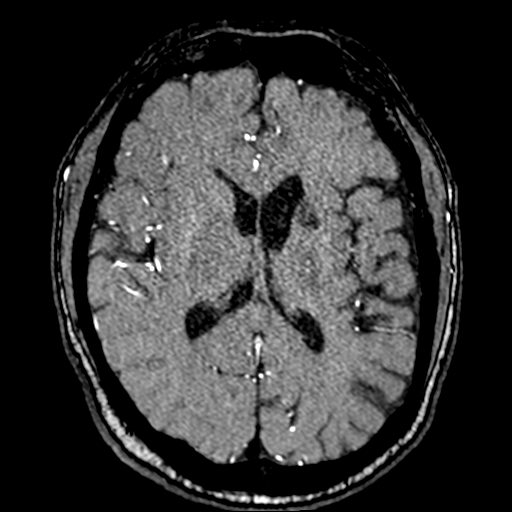
[im 153/160]
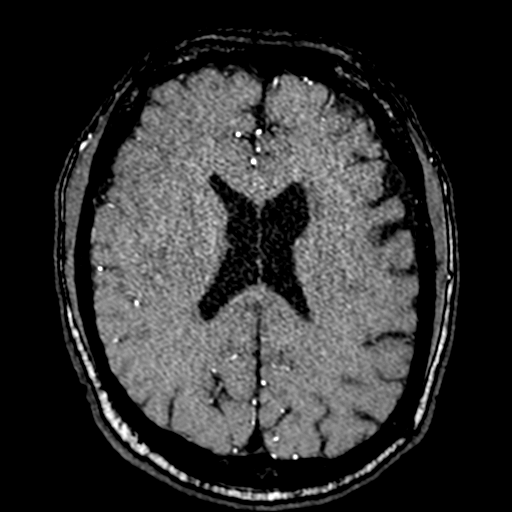

[19 of 48 positions shown; findings below may reference images not displayed]

FINDINGS: Anterior circulation: Severe intracranial atherosclerosis.
Atheromatous irregularity of the bilateral cavernous carotids
accentuated by skull base artifact. Diffuse atheromatous type
irregularity of intracranial branches with advanced right M1
stenosis and left M1 occlusion with flow gap and faint downstream
flow. The occlusion is considered chronic based on the absence of
acute infarct. Negative for aneurysm.

Posterior circulation: Atheromatous irregularity of vertebral and
basilar arteries, greatest at the left V4 segment, which is non
dominant. Extensive atheromatous irregularity of bilateral posterior
cerebral arteries with moderate left P2 segment stenosis and
advanced bilateral PCA branch stenoses.

Anatomic variants: Hypoplastic right A1 segment.
IMPRESSION: Severe intracranial atherosclerosis with left MCA occlusion.

## 2022-03-26 IMAGING — MR MR CERVICAL SPINE W/O CM
6 series · 37 of 48 positions shown · non-contrast
Comparison: None Available.

CLINICAL DATA: One-week history of right hand weakness

EXAM:
MRI CERVICAL SPINE WITHOUT CONTRAST
TECHNIQUE: Multiplanar, multisequence MR imaging of the cervical spine was
performed. No intravenous contrast was administered.

[Series 5: T2 · sagittal · 3.0mm · 0.69mm/px · 5 of 15 slices shown (1 of 2)]
[im 1/15]
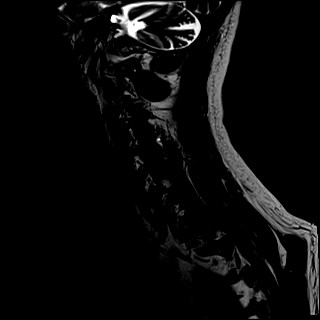
[im 4/15]
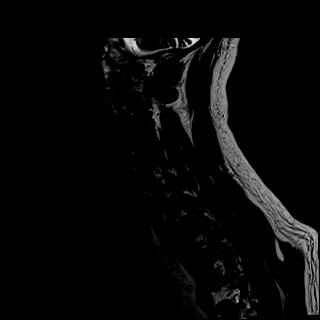
[im 8/15]
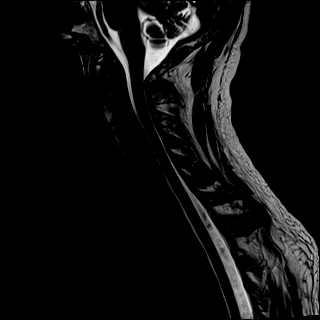
[im 11/15]
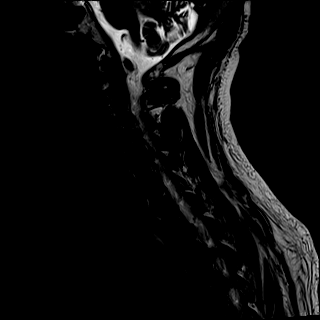
[im 15/15]
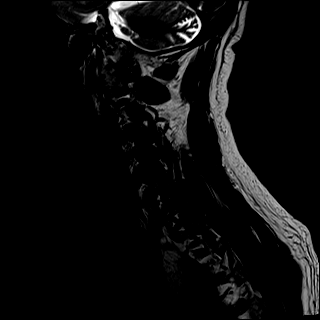

[Series 6: T1 · sagittal · 3.0mm · 0.69mm/px · 5 of 15 slices shown (1 of 2)]
[im 1/15]
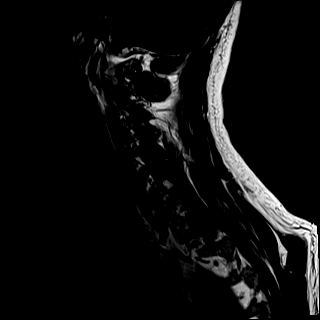
[im 4/15]
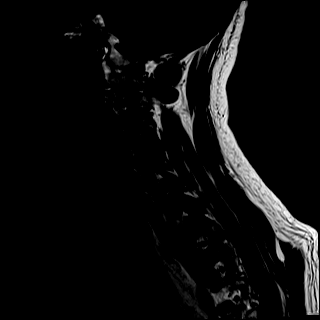
[im 8/15]
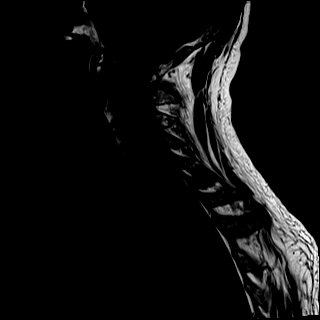
[im 11/15]
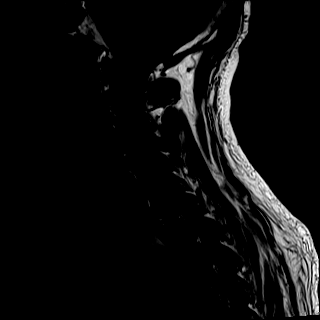
[im 15/15]
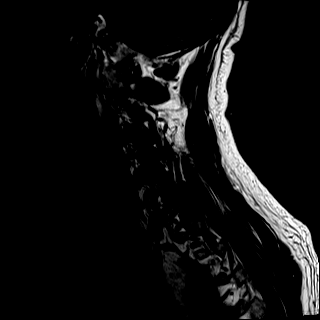

[Series 7: STIR · sagittal · 3.0mm · 0.86mm/px · 5 of 15 slices shown]
[im 1/15]
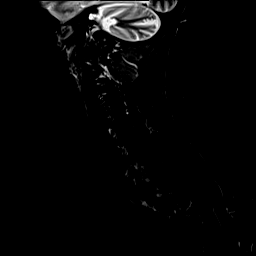
[im 4/15]
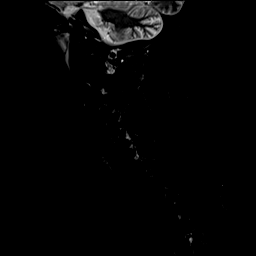
[im 8/15]
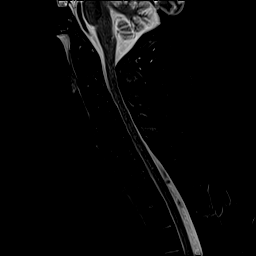
[im 11/15]
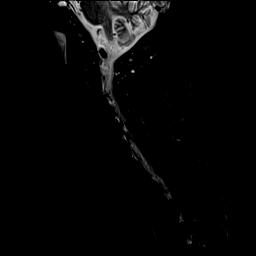
[im 15/15]
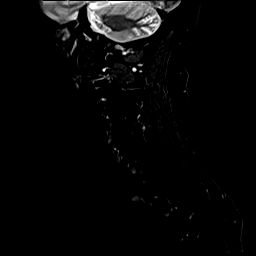

[Series 8: T2 · axial · 3.0mm · 0.66mm/px · z∈[-78,+41]mm · 9 of 40 slices shown (2 of 2)]
[im 1/40]
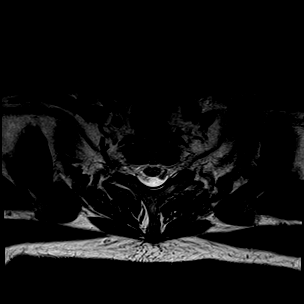
[im 4/40]
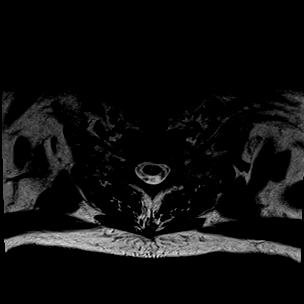
[im 7/40]
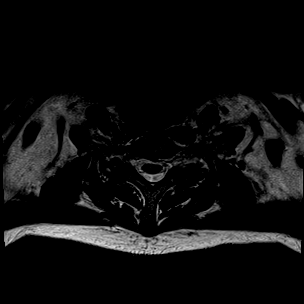
[im 13/40]
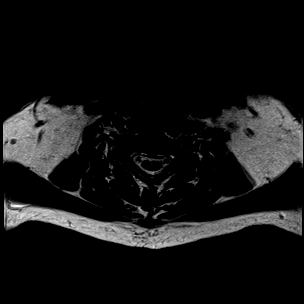
[im 19/40]
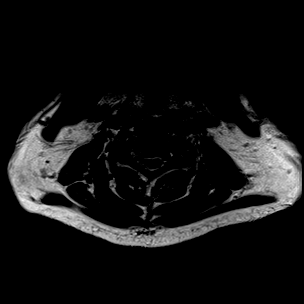
[im 22/40]
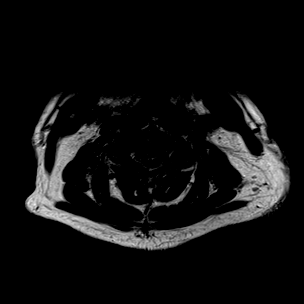
[im 28/40]
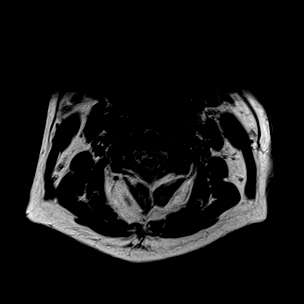
[im 34/40]
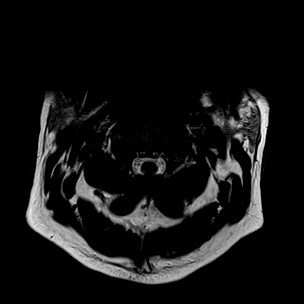
[im 40/40]
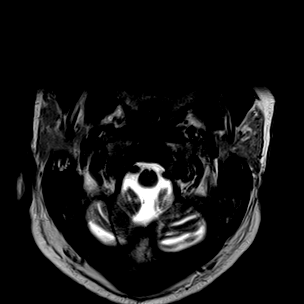

[Series 9: GRE · axial · 3.0mm · 0.39mm/px · z∈[-78,+41]mm · 8 of 40 slices shown]
[im 1/40]
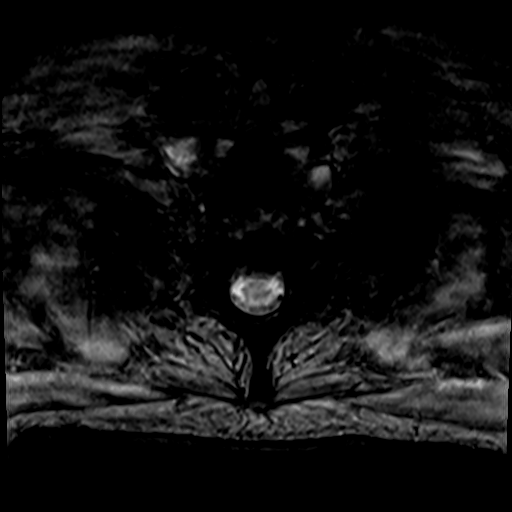
[im 7/40]
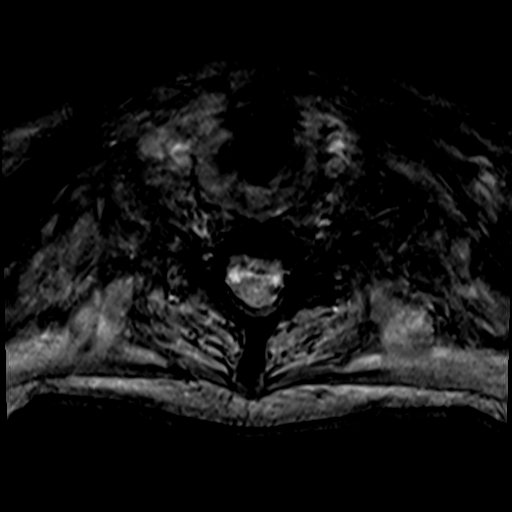
[im 13/40]
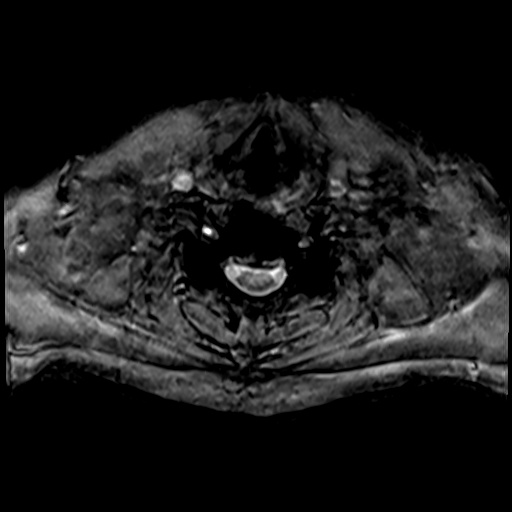
[im 19/40]
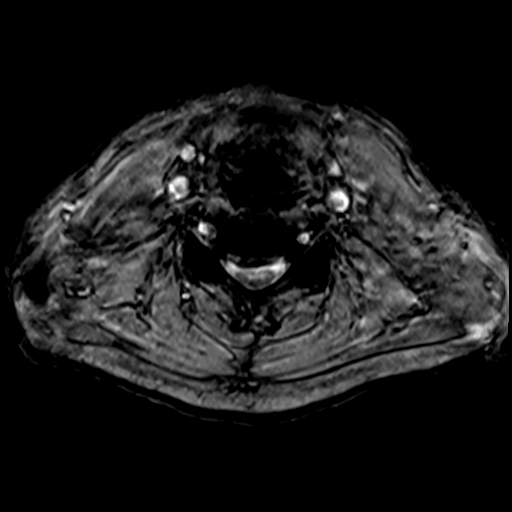
[im 22/40]
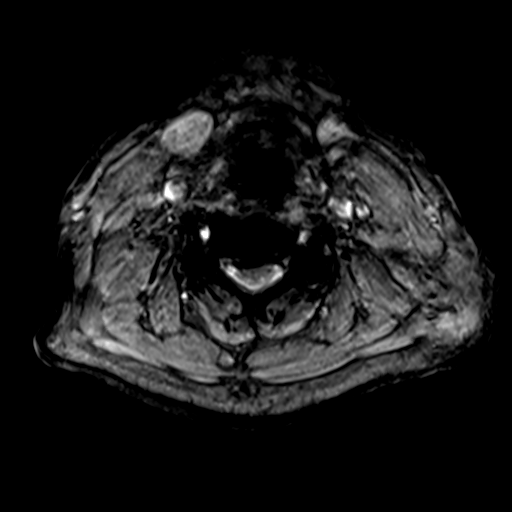
[im 28/40]
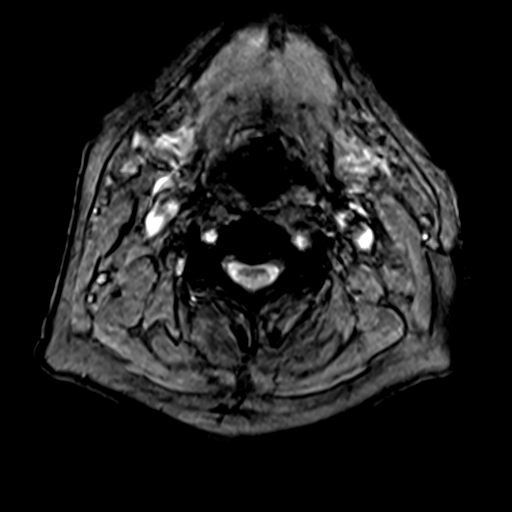
[im 34/40]
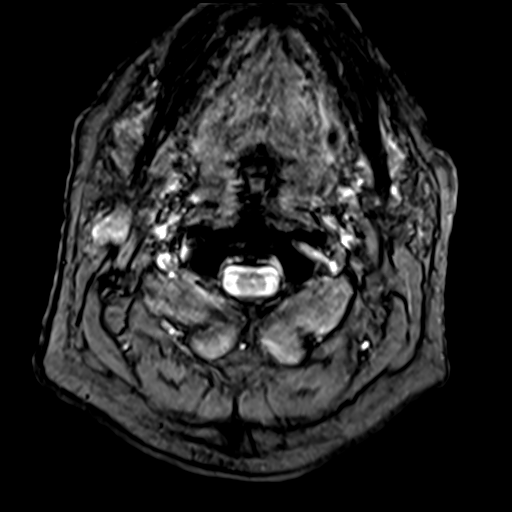
[im 40/40]
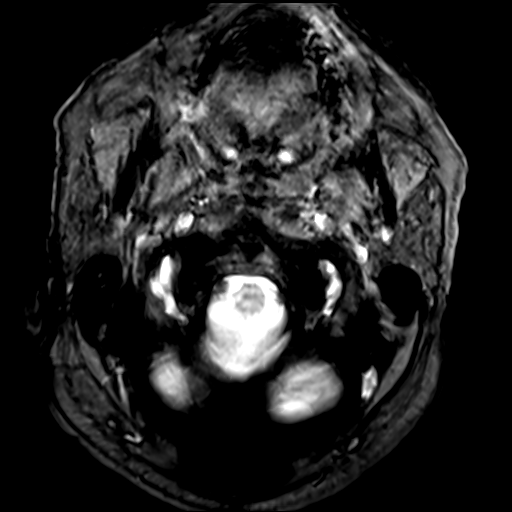

[Series 10: T1 · sagittal · 3.0mm · 0.69mm/px · 5 of 15 slices shown (2 of 2)]
[im 1/15]
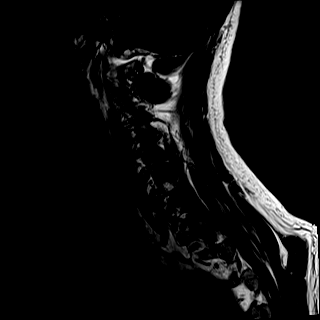
[im 4/15]
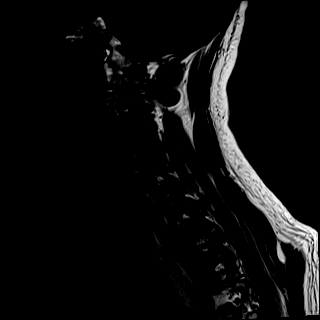
[im 8/15]
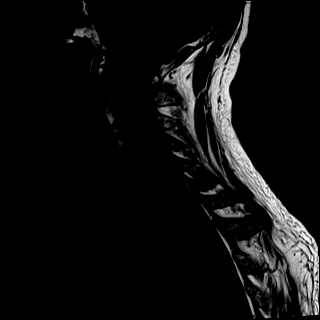
[im 11/15]
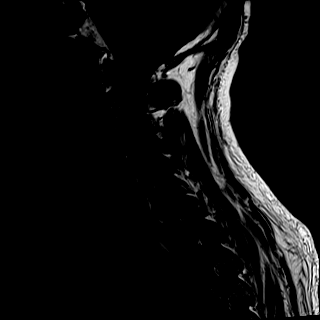
[im 15/15]
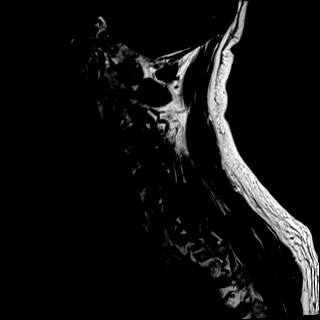

[37 of 48 positions shown; findings below may reference images not displayed]

FINDINGS: Alignment: Physiologic.

Vertebrae: No fracture, evidence of discitis, or bone lesion.

Cord: Normal signal and morphology.

Posterior Fossa, vertebral arteries, paraspinal tissues: Negative.

Disc levels:

C2-3: Mild left facet and uncovertebral spurring. Mild left
foraminal stenosis

C3-4: Disc narrowing with bulging and uncovertebral ridging.
Bilateral facet spurring. Mild to moderate bilateral foraminal
stenosis. Mild spinal stenosis

C4-5: Disc narrowing and bulging with endplate and uncovertebral
ridging eccentric to the right. Advanced right foraminal stenosis.
Mild to moderate left foraminal narrowing. A right paracentral disc
osteophyte complex indents the right ventral cord

C5-6: Disc narrowing and bulging. Mild spurring. Small left
paracentral protrusion. No neural compression

C6-7: Mild disc bulging and endplate spurring. No neural impingement

C7-T1:Unremarkable.
IMPRESSION: 1. Cervical spine degeneration most notable at C4-5 where there is
advanced right foraminal impingement and right ventral cord
indentation.
2. Mild to moderate foraminal narrowing bilaterally at C3-4 and on
the left at C4-5.

## 2022-03-26 IMAGING — MR MR MRA NECK W/O CM
2 series · 48 of 48 positions shown · non-contrast
Comparison: Brain MRI from yesterday

CLINICAL DATA: Right arm weakness

EXAM:
MRA NECK WITHOUT CONTRAST
TECHNIQUE: Angiographic images of the neck were acquired using MRA technique
without intravenous contrast. Carotid stenosis measurements (when
applicable) are obtained utilizing NASCET criteria, using the distal
internal carotid diameter as the denominator.

[Series 6: [id number]_tra (better) · axial · 0.8mm · 0.78mm/px · z∈[-120,+57]mm · 41 of 222 slices shown]
[im 1/222]
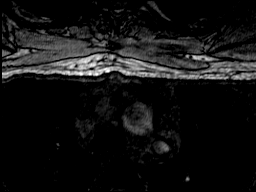
[im 6/222]
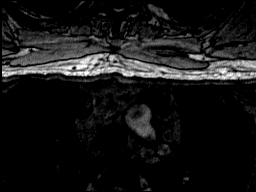
[im 12/222]
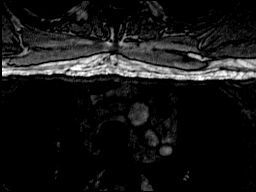
[im 17/222]
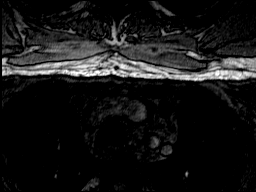
[im 23/222]
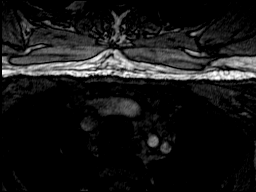
[im 28/222]
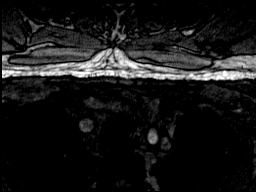
[im 34/222]
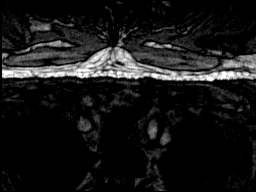
[im 39/222]
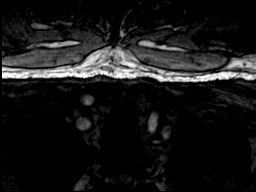
[im 45/222]
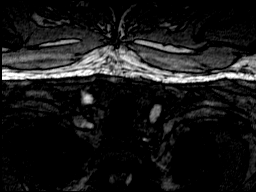
[im 50/222]
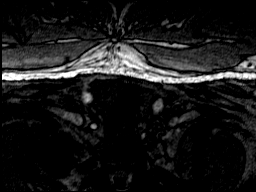
[im 56/222]
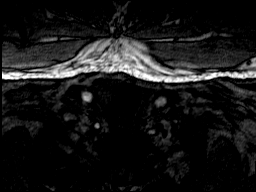
[im 61/222]
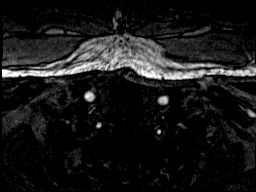
[im 67/222]
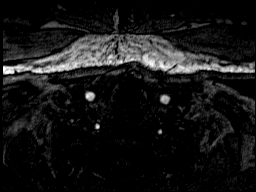
[im 72/222]
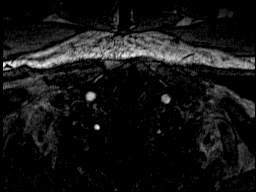
[im 78/222]
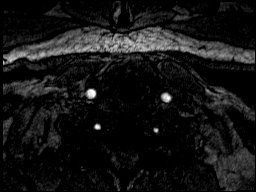
[im 83/222]
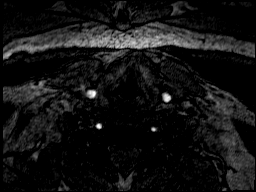
[im 89/222]
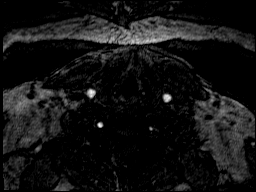
[im 94/222]
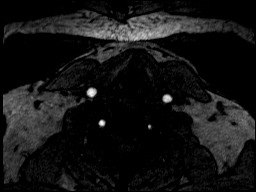
[im 100/222]
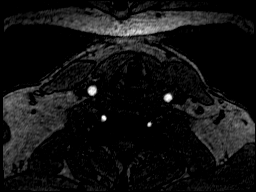
[im 105/222]
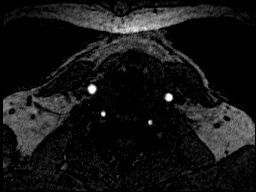
[im 111/222]
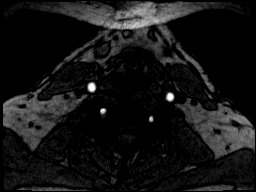
[im 117/222]
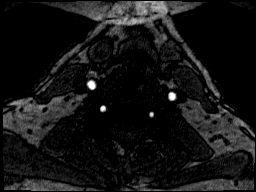
[im 122/222]
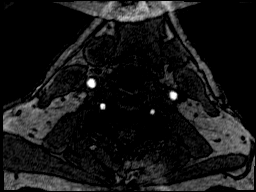
[im 128/222]
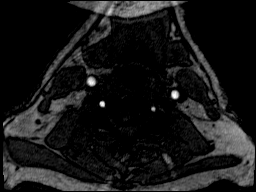
[im 133/222]
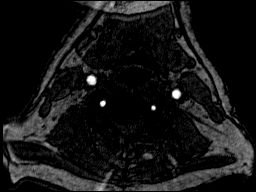
[im 139/222]
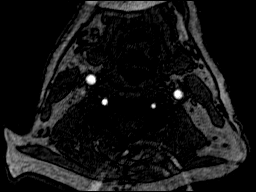
[im 144/222]
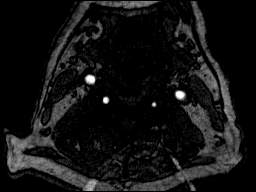
[im 150/222]
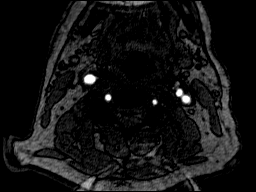
[im 155/222]
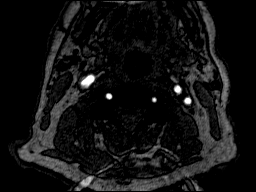
[im 161/222]
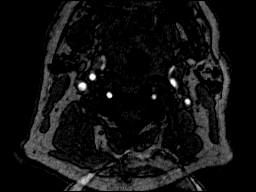
[im 166/222]
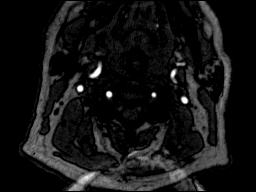
[im 172/222]
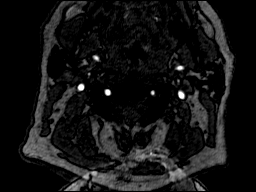
[im 177/222]
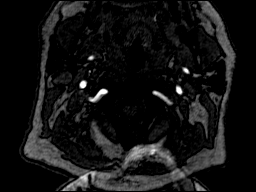
[im 183/222]
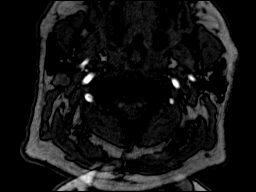
[im 188/222]
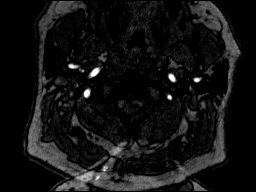
[im 194/222]
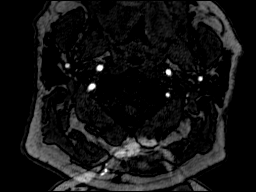
[im 199/222]
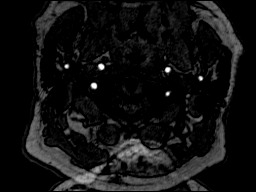
[im 205/222]
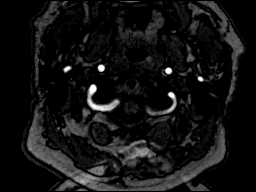
[im 210/222]
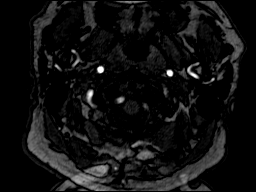
[im 216/222]
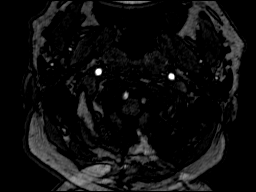
[im 222/222]
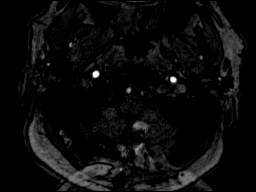

[Series 9: T1 fat-sat · axial · 4.0mm · 0.72mm/px · z∈[-130,+65]mm · 7 of 40 slices shown]
[im 1/40]
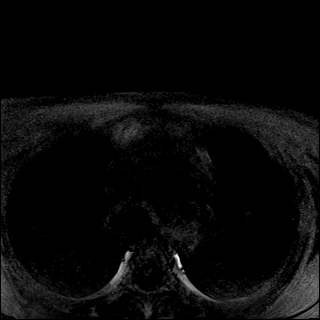
[im 7/40]
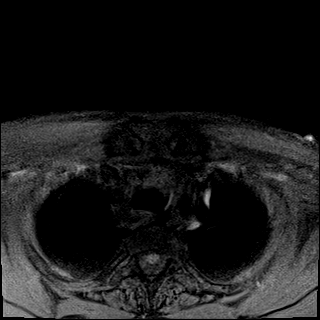
[im 14/40]
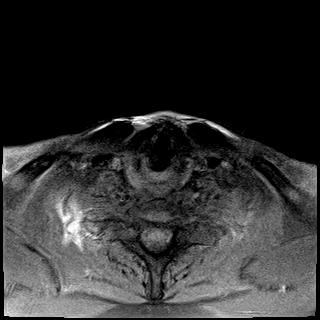
[im 20/40]
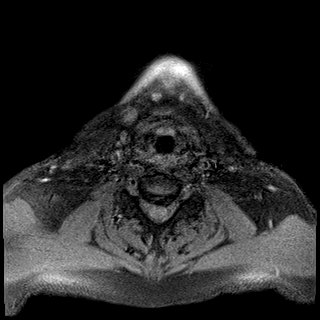
[im 27/40]
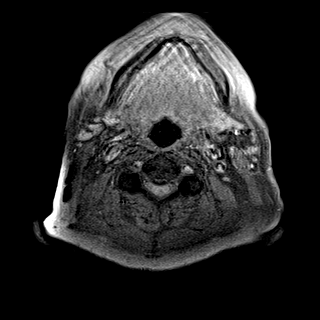
[im 33/40]
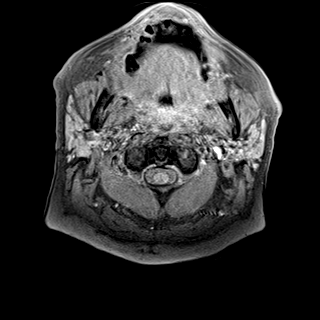
[im 40/40]
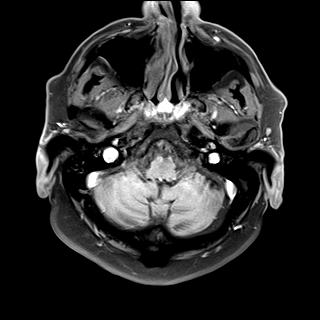

[48 of 48 positions shown; findings below may reference images not displayed]

FINDINGS: Aortic arch: Essentially not covered.

Right carotid system: Smoothly contoured common and internal carotid
arteries which are widely patent

Left carotid system: Smoothly contoured common and internal carotid
arteries which are widely patent.

Vertebral arteries: Dominant on the right. No proximal subclavian or
vertebral stenosis. Negative for beading.
IMPRESSION: Negative MRA of the neck.

## 2022-03-26 MED ORDER — LISINOPRIL-HYDROCHLOROTHIAZIDE 20-25 MG PO TABS: 1.0000 | ORAL_TABLET | Freq: Every day | ORAL | Status: DC

## 2022-03-26 MED ORDER — ACETAMINOPHEN 325 MG PO TABS: 650.0000 mg | ORAL_TABLET | ORAL | Status: DC | PRN

## 2022-03-26 MED ORDER — ACETAMINOPHEN 160 MG/5ML PO SOLN: 650.0000 mg | ORAL | Status: DC | PRN

## 2022-03-26 MED ORDER — ALBUTEROL SULFATE (2.5 MG/3ML) 0.083% IN NEBU: 2.5000 mg | INHALATION_SOLUTION | Freq: Four times a day (QID) | RESPIRATORY_TRACT | Status: DC | PRN

## 2022-03-26 MED ORDER — ATORVASTATIN CALCIUM 40 MG PO TABS
40.0000 mg | ORAL_TABLET | Freq: Every day | ORAL | Status: DC
  Administered 2022-03-26 – 2022-03-28 (×3): 40 mg via ORAL
  Filled 2022-03-26 (×3): qty 1

## 2022-03-26 MED ORDER — STROKE: EARLY STAGES OF RECOVERY BOOK: Freq: Once | Status: DC

## 2022-03-26 MED ORDER — HEPARIN SODIUM (PORCINE) 5000 UNIT/ML IJ SOLN
5000.0000 [IU] | Freq: Three times a day (TID) | INTRAMUSCULAR | Status: DC
  Administered 2022-03-26 – 2022-03-28 (×8): 5000 [IU] via SUBCUTANEOUS
  Filled 2022-03-26 (×8): qty 1

## 2022-03-26 MED ORDER — BASAGLAR KWIKPEN 100 UNIT/ML ~~LOC~~ SOPN: 10.0000 [IU] | PEN_INJECTOR | Freq: Every day | SUBCUTANEOUS | Status: DC

## 2022-03-26 MED ORDER — CLOPIDOGREL BISULFATE 75 MG PO TABS
75.0000 mg | ORAL_TABLET | Freq: Every day | ORAL | Status: DC
  Administered 2022-03-26 – 2022-03-28 (×3): 75 mg via ORAL
  Filled 2022-03-26 (×3): qty 1

## 2022-03-26 MED ORDER — ACETAMINOPHEN 650 MG RE SUPP: 650.0000 mg | RECTAL | Status: DC | PRN

## 2022-03-26 MED ORDER — ALBUTEROL SULFATE HFA 108 (90 BASE) MCG/ACT IN AERS: 2.0000 | INHALATION_SPRAY | Freq: Four times a day (QID) | RESPIRATORY_TRACT | Status: DC | PRN

## 2022-03-26 MED ORDER — CYANOCOBALAMIN 1000 MCG/ML IJ SOLN
1000.0000 ug | Freq: Every day | INTRAMUSCULAR | Status: DC
  Administered 2022-03-26 – 2022-03-28 (×3): 1000 ug via SUBCUTANEOUS
  Filled 2022-03-26 (×3): qty 1

## 2022-03-26 MED ORDER — ASPIRIN EC 81 MG PO TBEC: 81.0000 mg | DELAYED_RELEASE_TABLET | Freq: Every day | ORAL | Status: DC

## 2022-03-26 MED ORDER — INSULIN GLARGINE-YFGN 100 UNIT/ML ~~LOC~~ SOLN
10.0000 [IU] | Freq: Every day | SUBCUTANEOUS | Status: DC
  Administered 2022-03-26 – 2022-03-28 (×3): 10 [IU] via SUBCUTANEOUS
  Filled 2022-03-26 (×3): qty 0.1

## 2022-03-26 MED ORDER — PANTOPRAZOLE SODIUM 40 MG PO TBEC
40.0000 mg | DELAYED_RELEASE_TABLET | Freq: Every day | ORAL | Status: DC
  Administered 2022-03-26 – 2022-03-28 (×3): 40 mg via ORAL
  Filled 2022-03-26 (×3): qty 1

## 2022-03-26 MED ORDER — AMLODIPINE BESYLATE 10 MG PO TABS
10.0000 mg | ORAL_TABLET | Freq: Every day | ORAL | Status: DC
  Administered 2022-03-26 – 2022-03-28 (×3): 10 mg via ORAL
  Filled 2022-03-26 (×2): qty 1
  Filled 2022-03-26: qty 2

## 2022-03-26 MED ORDER — ASPIRIN 325 MG PO TABS
325.0000 mg | ORAL_TABLET | Freq: Every day | ORAL | Status: DC
  Administered 2022-03-26 – 2022-03-28 (×3): 325 mg via ORAL
  Filled 2022-03-26 (×3): qty 1

## 2022-03-26 MED ORDER — ASPIRIN 300 MG RE SUPP: 300.0000 mg | Freq: Every day | RECTAL | Status: DC

## 2022-03-26 MED ORDER — CARVEDILOL 12.5 MG PO TABS: 25.0000 mg | ORAL_TABLET | Freq: Two times a day (BID) | ORAL | Status: DC

## 2022-03-26 MED ORDER — MOMETASONE FURO-FORMOTEROL FUM 200-5 MCG/ACT IN AERO
2.0000 | INHALATION_SPRAY | Freq: Two times a day (BID) | RESPIRATORY_TRACT | Status: DC
  Administered 2022-03-26 – 2022-03-28 (×4): 2 via RESPIRATORY_TRACT
  Filled 2022-03-26: qty 8.8

## 2022-03-26 MED ORDER — NICOTINE 21 MG/24HR TD PT24
21.0000 mg | MEDICATED_PATCH | Freq: Every day | TRANSDERMAL | Status: DC
  Administered 2022-03-26 – 2022-03-28 (×3): 21 mg via TRANSDERMAL
  Filled 2022-03-26 (×3): qty 1

## 2022-03-26 NOTE — Progress Notes (Addendum)
STROKE TEAM PROGRESS NOTE   SUBJECTIVE (INTERVAL HISTORY) No family is at the bedside.  Overall his condition is gradually improving. He is homeless and does take some medications daily but he could not tell what medications he is taking. Still complaining of right lateral forearm mild numbness and right facial mild numbness. Still felt right arm weaker than left. He did complain of neck pain more on the right side.    OBJECTIVE Temp:  [97.8 F (36.6 C)-100 F (37.8 C)] 97.8 F (36.6 C) (05/13 1049) Pulse Rate:  [72-83] 75 (05/13 1049) Resp:  [13-18] 15 (05/13 1049) BP: (108-140)/(54-88) 123/78 (05/13 1049) SpO2:  [93 %-100 %] 95 % (05/13 1049)  Recent Labs  Lab 03/25/22 1134 03/26/22 0031 03/26/22 0759  GLUCAP 237* 176* 147*   Recent Labs  Lab 03/25/22 1147  NA 139  K 3.8  CL 100  CO2 30  GLUCOSE 226*  BUN 24*  CREATININE 1.26*  CALCIUM 9.5   Recent Labs  Lab 03/25/22 1147  AST 20  ALT 26  ALKPHOS 83  BILITOT 0.9  PROT 7.3  ALBUMIN 4.0   Recent Labs  Lab 03/25/22 1147  WBC 10.0  NEUTROABS 6.8  HGB 13.8  HCT 41.3  MCV 90.6  PLT 254   No results for input(s): CKTOTAL, CKMB, CKMBINDEX, TROPONINI in the last 168 hours. Recent Labs    03/25/22 2022  LABPROT 12.5  INR 1.0   No results for input(s): COLORURINE, LABSPEC, PHURINE, GLUCOSEU, HGBUR, BILIRUBINUR, KETONESUR, PROTEINUR, UROBILINOGEN, NITRITE, LEUKOCYTESUR in the last 72 hours.  Invalid input(s): APPERANCEUR     Component Value Date/Time   CHOL 153 03/26/2022 1043   CHOL 241 (H) 01/18/2022 1109   TRIG 206 (H) 03/26/2022 1043   HDL 36 (L) 03/26/2022 1043   HDL 46 01/18/2022 1109   CHOLHDL 4.3 03/26/2022 1043   VLDL 41 (H) 03/26/2022 1043   LDLCALC 76 03/26/2022 1043   LDLCALC 119 (H) 01/18/2022 1109   Lab Results  Component Value Date   HGBA1C 8.2 (H) 03/26/2022   No results found for: LABOPIA, COCAINSCRNUR, LABBENZ, AMPHETMU, THCU, LABBARB  Recent Labs  Lab 03/25/22 1122  ETH <10     I have personally reviewed the radiological images below and agree with the radiology interpretations.  CT Head Wo Contrast  Result Date: 03/25/2022 CLINICAL DATA:  Neurological deficit, slurred speech and RIGHT arm numbness for 1 week, question stroke EXAM: CT HEAD WITHOUT CONTRAST TECHNIQUE: Contiguous axial images were obtained from the base of the skull through the vertex without intravenous contrast. RADIATION DOSE REDUCTION: This exam was performed according to the departmental dose-optimization program which includes automated exposure control, adjustment of the mA and/or kV according to patient size and/or use of iterative reconstruction technique. COMPARISON:  None Available. FINDINGS: Brain: Mild atrophy. Normal ventricular morphology. No midline shift or mass effect. Old lacunar infarct LEFT caudate head. Mild small vessel chronic ischemic changes a deep cerebral white matter. No intracranial hemorrhage, mass lesion or additional infarction. Vascular: No hyperdense vessels Skull: Intact Sinuses/Orbits: Clear Other: N/A IMPRESSION: Atrophy with small vessel chronic ischemic changes a deep cerebral white matter. Old lacunar infarct LEFT caudate head. No acute intracranial abnormalities. Electronically Signed   By: Ulyses Southward M.D.   On: 03/25/2022 12:03   MR ANGIO HEAD WO CONTRAST  Result Date: 03/26/2022 CLINICAL DATA:  Neuro deficit.  Right arm weakness. EXAM: MRA HEAD WITHOUT CONTRAST TECHNIQUE: Angiographic images of the Circle of Willis were acquired using MRA  technique without intravenous contrast. COMPARISON:  Brain MRI from yesterday FINDINGS: Anterior circulation: Severe intracranial atherosclerosis. Atheromatous irregularity of the bilateral cavernous carotids accentuated by skull base artifact. Diffuse atheromatous type irregularity of intracranial branches with advanced right M1 stenosis and left M1 occlusion with flow gap and faint downstream flow. The occlusion is considered  chronic based on the absence of acute infarct. Negative for aneurysm. Posterior circulation: Atheromatous irregularity of vertebral and basilar arteries, greatest at the left V4 segment, which is non dominant. Extensive atheromatous irregularity of bilateral posterior cerebral arteries with moderate left P2 segment stenosis and advanced bilateral PCA branch stenoses. Anatomic variants: Hypoplastic right A1 segment. IMPRESSION: Severe intracranial atherosclerosis with left MCA occlusion. Electronically Signed   By: Tiburcio Pea M.D.   On: 03/26/2022 06:25   MR ANGIO NECK WO CONTRAST  Result Date: 03/26/2022 CLINICAL DATA:  Right arm weakness EXAM: MRA NECK WITHOUT CONTRAST TECHNIQUE: Angiographic images of the neck were acquired using MRA technique without intravenous contrast. Carotid stenosis measurements (when applicable) are obtained utilizing NASCET criteria, using the distal internal carotid diameter as the denominator. COMPARISON:  Brain MRI from yesterday FINDINGS: Aortic arch: Essentially not covered. Right carotid system: Smoothly contoured common and internal carotid arteries which are widely patent Left carotid system: Smoothly contoured common and internal carotid arteries which are widely patent. Vertebral arteries: Dominant on the right. No proximal subclavian or vertebral stenosis. Negative for beading. IMPRESSION: Negative MRA of the neck. Electronically Signed   By: Tiburcio Pea M.D.   On: 03/26/2022 06:08   MR BRAIN WO CONTRAST  Result Date: 03/25/2022 CLINICAL DATA:  Stroke follow-up. Right-sided numbness, weakness, and difficulty speaking. EXAM: MRI HEAD WITHOUT CONTRAST TECHNIQUE: Multiplanar, multiecho pulse sequences of the brain and surrounding structures were obtained without intravenous contrast. COMPARISON:  Head CT 03/25/2022 FINDINGS: Brain: There is no evidence of an acute infarct, mass, midline shift, or extra-axial fluid collection. A chronic infarct is noted anteriorly  in the left basal ganglia with associated chronic blood products and ex vacuo dilatation of the frontal horn of the left lateral ventricle. T2 hyperintensities elsewhere in the cerebral white matter bilaterally are nonspecific but compatible with mild chronic small vessel ischemic disease. A small chronic infarct is noted in the left frontal white matter at the level of the centrum semiovale. There is mild cerebral and mild-to-moderate cerebellar atrophy. Vascular: Major intracranial vascular flow voids are preserved. Skull and upper cervical spine: Unremarkable bone marrow signal. Sinuses/Orbits: Unremarkable orbits. Mild mucosal thickening/small mucous retention cysts in the paranasal sinuses. Small right mastoid effusion. Other: None. IMPRESSION: 1. No acute intracranial abnormality. 2. Mild chronic small vessel ischemic disease with chronic infarcts in the left basal ganglia and left frontal white matter. 3. Cerebral and cerebellar atrophy. Electronically Signed   By: Sebastian Ache M.D.   On: 03/25/2022 18:37   MR CERVICAL SPINE WO CONTRAST  Result Date: 03/26/2022 CLINICAL DATA:  One-week history of right hand weakness EXAM: MRI CERVICAL SPINE WITHOUT CONTRAST TECHNIQUE: Multiplanar, multisequence MR imaging of the cervical spine was performed. No intravenous contrast was administered. COMPARISON:  None Available. FINDINGS: Alignment: Physiologic. Vertebrae: No fracture, evidence of discitis, or bone lesion. Cord: Normal signal and morphology. Posterior Fossa, vertebral arteries, paraspinal tissues: Negative. Disc levels: C2-3: Mild left facet and uncovertebral spurring. Mild left foraminal stenosis C3-4: Disc narrowing with bulging and uncovertebral ridging. Bilateral facet spurring. Mild to moderate bilateral foraminal stenosis. Mild spinal stenosis C4-5: Disc narrowing and bulging with endplate and uncovertebral ridging eccentric  to the right. Advanced right foraminal stenosis. Mild to moderate left  foraminal narrowing. A right paracentral disc osteophyte complex indents the right ventral cord C5-6: Disc narrowing and bulging. Mild spurring. Small left paracentral protrusion. No neural compression C6-7: Mild disc bulging and endplate spurring. No neural impingement C7-T1:Unremarkable. IMPRESSION: 1. 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 where there is advanced right foraminal impingement and right ventral cord indentation. 2. Mild to moderate foraminal narrowing bilaterally at C3-4 and on the left at C4-5. Electronically Signed   By: Tiburcio Pea M.D.   On: 03/26/2022 06:19     PHYSICAL EXAM  Temp:  [97.8 F (36.6 C)-100 F (37.8 C)] 97.8 F (36.6 C) (05/13 1049) Pulse Rate:  [72-83] 75 (05/13 1049) Resp:  [13-18] 15 (05/13 1049) BP: (108-140)/(54-88) 123/78 (05/13 1049) SpO2:  [93 %-100 %] 95 % (05/13 1049)  General - Well nourished, well developed, in no apparent distress.  Ophthalmologic - fundi not visualized due to noncooperation.  Cardiovascular - Regular rhythm and rate.  Mental Status -  Level of arousal and orientation to time, place, and person were intact. Language including expression, naming, repetition, comprehension was assessed and found intact. Fund of Knowledge was assessed and was intact.  Cranial Nerves II - XII - II - Visual field intact OS, legally blind OD able to see hand waving but not finger counting. III, IV, VI - Extraocular movements showed right eye outward position V - Facial sensation mildly decreased on the right VII - mild right facial droop. VIII - Hearing & vestibular intact bilaterally. X - Palate elevates symmetrically. XI - Chin turning & shoulder shrug intact bilaterally. XII - Tongue protrusion intact.  Motor Strength - The patient's strength was normal in all extremities except mild pronator drift on the right and mildly decreased right hand dexterity. Bulk was normal and fasciculations were absent.   Motor Tone - Muscle  tone was assessed at the neck and appendages and was normal.  Reflexes - The patient's reflexes were symmetrical in all extremities and he had no pathological reflexes.  Sensory - Light touch were assessed and were decreased on the lateral side of right UE and right fingers.    Coordination - The patient had normal movements in the hands with no ataxia or dysmetria.  Tremor was absent.  Gait and Station - deferred.   ASSESSMENT/PLAN Mr. Stanley Stone is a 56 y.o. male with history of hypertension, hyperlipidemia, diabetes, smoker, COPD, homelessness admitted for right-sided weakness numbness on and off for 1 week. No tPA given due to outside window.    Left brain hypoperfusion vs. TIA due to left MCA occlusion, secondary to large vessel disease CT head no acute finding, chronic left BG and caudate head lacunar infarcts MRI no acute infarct, chronic left BG and caudate head lacunar infarcts MRA neck negative MRA head left MCA occlusion, right M1 distal high-grade stenosis, severe intracranial stenosis 2D Echo pending LDL 76 HgbA1c 8.2 Heparin subcu for VTE prophylaxis No antithrombotic prior to admission, now on aspirin 325 mg daily and clopidogrel 75 mg daily for 3 months and then aspirin alone given large vessel occlusion/high-grade stenosis Patient counseled to be compliant with his antithrombotic medications Ongoing aggressive stroke risk factor management Therapy recommendations: Outpatient PT Disposition: Pending  Diabetes HgbA1c 8.2 goal < 7.0 Uncontrolled Currently on insulin CBG monitoring SSI DM education and close PCP follow up  Hypertension Stable Avoid low BP given bilateral MCA occlusion/high-grade stenosis Long term BP goal normotensive  Hyperlipidemia Home meds: None LDL 76, goal < 70 Now on Lipitor 40 Continue statin at discharge  Cervical spinal stenosis Patient did complain of neck pain right more than left Right lateral forearm numbness on  exam MRI C-spine showed right C4-5 foraminal impingement and ventral cord indentation Continue PT/OT Recommend outpatient EMG/NCS  Tobacco abuse Current smoker Smoking cessation counseling provided  Other Stroke Risk Factors EtOH use, educated on alcohol limitation, no more than 2 drinks per day  Other Active Problems COPD Homelessness  Hospital day # 0    Marvel Plan, MD PhD Stroke Neurology 03/26/2022 12:19 PM    To contact Stroke Continuity provider, please refer to [email or web address]. After hours, contact General Neurology

## 2022-03-26 NOTE — ED Notes (Signed)
Breakfast order placed ?

## 2022-03-26 NOTE — Consult Note (Signed)
NEUROLOGY CONSULTATION NOTE  ? ?Date of service: Mar 26, 2022 ?Patient Name: Tenoch Lowary ?MRN:  [id number] ?DOB:  Apr 04, 1966 ?Reason for consult: "R sided weakness" ?Requesting Provider: Kristopher Oppenheim, DO ?_ _ _   _ __   _ __ _ _  __ __   _ __   __ _ ? ?History of Present Illness  ?Other Schnitzer is a 56 y.o. male with PMH significant for COPD, DM2, HTN, HLD, GERD who presents with R sided weakness x 1 week. ? ?About a week ago, was standing and lit up a cigarette and out of nowhere, cigarette fell out of his hand. Lit a couple more cigarettes and they all seemed to fall out of his R hand. He was unable to lift his R hand upto his head. This went on for an hour and since then has had it off and on. His R hand is numb to touch but the numbness is mild. ? ?He is homeless, he has diabetes, hypertension and hyperlipidemia and reports that his chronic conditions are now well controlled secondary to him being homeless and poor access to food and medications.  He tries his best. ? ?He smokes every day and is not ready to quit.  He tells me that he only drinks a beer every 3 to 4 days. ? ?No bowel or bladder incontinence. Does report urinary urgency and sensation of incomplete emptying of his bladder. Denies lhermitte's sign. ? ?LKW: 03/19/22 ?mRS: 0 ?tNKASE: not offered outside the window ?Thrombectomy: not offered, outside the window. ?NIHSS components Score: Comment  ?1a Level of Conscious 0[x]  1[]  2[]  3[]      ?1b LOC Questions 0[x]  1[]  2[]       ?1c LOC Commands 0[x]  1[]  2[]       ?2 Best Gaze 0[x]  1[]  2[]       ?3 Visual 0[x]  1[]  2[]  3[]      ?4 Facial Palsy 0[x]  1[]  2[]  3[]      ?5a Motor Arm - left 0[x]  1[]  2[]  3[]  4[]  UN[]    ?5b Motor Arm - Right 0[x]  1[]  2[]  3[]  4[]  UN[]    ?6a Motor Leg - Left 0[x]  1[]  2[]  3[]  4[]  UN[]    ?6b Motor Leg - Right 0[x]  1[]  2[]  3[]  4[]  UN[]    ?7 Limb Ataxia 0[]  1[x]  2[]  3[]  UN[]     ?8 Sensory 0[]  1[x]  2[]  UN[]      ?9 Best Language 0[x]  1[]  2[]  3[]      ?10 Dysarthria 0[x]  1[]  2[]  UN[]      ?11  Extinct. and Inattention 0[x]  1[]  2[]       ?TOTAL: 2   ?  ?ROS  ? ?Constitutional Denies weight loss, fever and chills.   ?HEENT Denies changes in vision and hearing.   ?Respiratory Denies SOB and cough.   ?CV Denies palpitations and CP   ?GI Denies abdominal pain, nausea, vomiting and diarrhea.   ?GU Denies dysuria and urinary frequency.   ?MSK Denies myalgia and joint pain.   ?Skin Denies rash and pruritus.   ?Neurological Denies headache and syncope.   ?Psychiatric Denies recent changes in mood. Denies anxiety and depression.   ? ?Past History  ? ?Past Medical History:  ?Diagnosis Date  ? Closed fracture dislocation of lumbar spine ([place]) 1987  ? COPD (chronic obstructive pulmonary disease) ([place])   ? Diabetes mellitus without complication ([place])   ? GERD (gastroesophageal reflux disease)   ? High cholesterol   ? Hypertension   ? ?History reviewed. No pertinent surgical history. ?No family history  on file. ?Social History  ? ?Socioeconomic History  ? Marital status: Divorced  ?  Spouse name: Not on file  ? Number of children: Not on file  ? Years of education: Not on file  ? Highest education level: Not on file  ?Occupational History  ? Not on file  ?Tobacco Use  ? Smoking status: Every Day  ?  Packs/day: 0.50  ?  Types: Cigarettes  ? Smokeless tobacco: Never  ?Vaping Use  ? Vaping Use: Never used  ?Substance and Sexual Activity  ? Alcohol use: Yes  ? Drug use: Not Currently  ? Sexual activity: Not Currently  ?Other Topics Concern  ? Not on file  ?Social History Narrative  ? Not on file  ? ?Social Determinants of Health  ? ?Financial Resource Strain: Not on file  ?Food Insecurity: Not on file  ?Transportation Needs: Not on file  ?Physical Activity: Not on file  ?Stress: Not on file  ?Social Connections: Not on file  ? ?Allergies  ?Allergen Reactions  ? Penicillins   ?  Swell Up   ? ? ?Medications  ?(Not in a hospital admission) ?  ? ?Vitals  ? ?Vitals:  ? 03/25/22 1328 03/25/22 1508 03/25/22 1642 03/25/22 1939  ?BP:  133/81 133/88 134/84 133/77  ?Pulse: 74 80 72 83  ?Resp: 18  16 18   ?Temp: 98.2 ?F (36.8 ?C) 97.8 ?F (36.6 ?C) 98.6 ?F (37 ?C) 100 ?F (37.8 ?C)  ?TempSrc: Oral Oral Oral Oral  ?SpO2: 93% 96% 100% 93%  ?Weight:      ?Height:      ?  ? ?Body mass index is 29.69 kg/m?. ? ?Physical Exam  ? ?General: Laying comfortably in bed; in no acute distress.  ?HENT: Normal oropharynx and mucosa. Normal external appearance of ears and nose.  ?Neck: Supple, no pain or tenderness  ?CV: No JVD. No peripheral edema.  ?Pulmonary: Symmetric Chest rise. Normal respiratory effort.  ?Abdomen: Soft to touch, non-tender.  ?Ext: No cyanosis, edema, or deformity  ?Skin: No rash. Normal palpation of skin.   ?Musculoskeletal: Normal digits and nails by inspection. No clubbing.  ? ?Neurologic Examination  ?Mental status/Cognition: Alert, oriented to self, place, month and year, good attention.  ?Speech/language: Fluent, comprehension intact, object naming intact, repetition intact.  ?Cranial nerves:  ? CN II Pupils equal and reactive to light, no VF deficits   ? CN III,IV,VI EOM intact, no gaze preference or deviation, no nystagmus   ? CN V normal sensation in V1, V2, and V3 segments bilaterally   ? CN VII no asymmetry, no nasolabial fold flattening   ? CN VIII normal hearing to speech   ? CN IX & X normal palatal elevation, no uvular deviation   ? CN XI 5/5 head turn and 5/5 shoulder shrug bilaterally   ? CN XII midline tongue protrusion   ? ?Motor:  ?Muscle bulk: normal, tone: normal, pronator drift mild RUE drift. Tremor: BL intention tremor right side is worse with ataxia. ?Mvmt Root Nerve  Muscle Right Left Comments  ?SA C5/6 Ax Deltoid 5 5   ?EF C5/6 Mc Biceps 5 5   ?EE C6/7/8 Rad Triceps 5 5   ?WF C6/7 Med FCR     ?WE C7/8 PIN ECU     ?F Ab C8/T1 U ADM/FDI 4+ 5   ?HF L1/2/3 Fem Illopsoas 5 5   ?KE L2/3/4 Fem Quad 5 5   ?DF L4/5 D Peron Tib Ant 5 5   ?PF S1/2  Tibial Grc/Sol 5 5   ? ?Reflexes: ? Right Left Comments  ?Pectoralis + +   ? Biceps  (C5/6) 2+ 2+   ?Brachioradialis (C5/6) 2+ 2+   ? Triceps (C6/7) 2+ 2+   ? Patellar (L3/4) 3 3   ? Achilles (S1) 4 4   ? Hoffman + +   ? Plantar withdraws withdraws   ?Jaw jerk   ? ?Sensation: ? Light touch Decreased mildly in RUE.  ? Pin prick   ? Temperature   ? Vibration   ?Proprioception   ? ?Coordination/Complex Motor:  ?- Finger to Nose with ataxia BL ?- Heel to shin intact BL ?- Rapid alternating movement are normal ?- Gait: Deferred ? ?Labs  ? ?CBC:  ?Recent Labs  ?Lab 03/25/22 ?1147  ?WBC 10.0  ?NEUTROABS 6.8  ?HGB 13.8  ?HCT 41.3  ?MCV 90.6  ?PLT 254  ? ? ?Basic Metabolic Panel:  ?Lab Results  ?Component Value Date  ? NA 139 03/25/2022  ? K 3.8 03/25/2022  ? CO2 30 03/25/2022  ? GLUCOSE 226 (H) 03/25/2022  ? BUN 24 (H) 03/25/2022  ? CREATININE 1.26 (H) 03/25/2022  ? CALCIUM 9.5 03/25/2022  ? GFRNONAA >60 03/25/2022  ? GFRAA 82 02/07/2019  ? ?Lipid Panel:  ?Lab Results  ?Component Value Date  ? LDLCALC 119 (H) 01/18/2022  ? ?HgbA1c:  ?Lab Results  ?Component Value Date  ? HGBA1C 9.1 (A) 01/18/2022  ? ?Urine Drug Screen: No results found for: LABOPIA, COCAINSCRNUR, [place], [place], THCU, LABBARB  ?Alcohol Level  ?   ?Component Value Date/Time  ? ETH <10 03/25/2022 1122  ? ? ?CT Head without contrast(Personally reviewed): ?Atrophy with small vessel chronic ischemic changes a deep cerebral ?white matter. ?Old lacunar infarct LEFT caudate head. ?No acute intracranial abnormalities. ? ?MR Angio head without contrast and Carotid Duplex BL(Personally reviewed): ?pending ? ?MRI Brain(Personally reviewed): ?1. No acute intracranial abnormality. ?2. Mild chronic small vessel ischemic disease with chronic infarcts ?in the left basal ganglia and left frontal white matter. ?3. Cerebral and cerebellar atrophy. ? ?Impression  ? ?Habram Caler is a 56 y.o. male with PMH significant for COPD, DM2, HTN, HLD, GERD who presents with R sided weakness x 1 week. Symptoms coupled with his poorly controlled stroke risk factors  are most concerning for a stuttering lacunar stroke. ? ?In addition to stroke workup, will get MRI C spine given hyperreflexia and ataxia along with incomplete emptying of bladder and urinary freque

## 2022-03-26 NOTE — Progress Notes (Signed)
? Stanley Stone  [id number] DOB: Apr 12, 1966 DOA: 03/25/2022 ?PCP: [name], MD   ? ?Brief Narrative:  ?56 year old homeless gentleman with a history of poor visual acuity (blind in right eye, blurry vision in left) HTN, DM 2, tobacco abuse, and alcohol abuse who presented to the ER with a 1 week history of right hand weakness.  Since this initial onset it has waxed and waned in an unpredictable fashion ? ?Consultants:  ?Neurology ? ?Goals of Care:  Code Status: Full Code  ? ?DVT prophylaxis: ?Subcutaneous heparin ? ?Interim Hx: ?Afebrile.  Vital signs stable.  Blood pressure controlled.  CBG controlled.  At the time of my visit the patient states that he has regained full use of his right arm and hand.  He denies chest pain shortness of breath fevers or chills.  He does report severe nicotine withdrawal stating that he smokes over a pack a day. ? ?Assessment & Plan: ? ?Right arm weakness ?CT head without acute findings ?MRI brain negative for acute/subacute CVA -mild chronic small vessel ischemic disease noted with cerebral and cerebellar atrophy ?MRa head notes severe intracranial atherosclerosis with a left MCA occlusion ?MRa neck negative ?MRI C-spine notes cervical spine 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 with right foraminal impingement and right ventral cord indentation ?Ultrasound carotid Dopplers pending ?TTE pending ?LDL 76 -continue Lipitor without change ?A1c 8.2 ?Neurology recommending ASA 325 mg plus Plavix for 3 months, and avoidance of hypotension ? ?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 with right foraminal impingement and right ventral cord indentation ?[name] -neurology recommending outpatient EMGs -symptoms have spontaneously resolved during his hospital stay ? ?Vitamin B12 deficiency ?Vitamin B12 quite low at 228 - supplement with subcutaneous low and oral replacement after discharge ? ?DM2 ?A1c 8.2  -CBG well controlled as inpatient thus far -  ? ?COPD with chronic bronchitis  ?No acute exacerbation ? ?Tobacco use ?Discussed need for absolute tobacco cessation with patient -utilize nicotine patch for now ? ?Homelessness ?Chronically homeless. Pt lives in a tent. ? ?History of alcohol abuse ?Pt has not had any alcohol in 5 days ? ?HTN ?Avoid overcorrection/hypotension in setting of significant intracranial vascular disease ? ?Family Communication: No family present at time of exam ?Disposition: Patient is homeless -will likely be medically ready for discharge within next 24 hours ? ? ?Objective: ?Blood pressure 129/75, pulse 82, temperature 98.1 ?F (36.7 ?C), temperature source Oral, resp. rate 17, height 5\' 5"  (1.651 m), weight 80.9 kg, SpO2 95 %. ?No intake or output data in the 24 hours ending 03/26/22 1040 ?Filed Weights  ? 03/25/22 1129  ?Weight: 80.9 kg  ? ? ?Examination: ?General: No acute respiratory distress ?Lungs: Clear to auscultation bilaterally without wheezes or crackles ?Cardiovascular: Regular rate and rhythm without murmur gallop or rub normal S1 and S2 ?Abdomen: Nontender, nondistended, soft, bowel sounds positive, no rebound, no ascites, no appreciable mass ?Extremities: No significant cyanosis, clubbing, or edema bilateral lower extremities ? ?CBC: ?Recent Labs  ?Lab 03/25/22 ?1147  ?WBC 10.0  ?NEUTROABS 6.8  ?HGB 13.8  ?HCT 41.3  ?MCV 90.6  ?PLT 254  ? ?Basic Metabolic Panel: ?Recent Labs  ?Lab 03/25/22 ?1147  ?NA 139  ?K 3.8  ?CL 100  ?CO2 30  ?GLUCOSE 226*  ?BUN 24*  ?CREATININE 1.26*  ?CALCIUM 9.5  ? ?GFR: ?Estimated Creatinine Clearance: 64.2 mL/min (A) (by C-G formula based on SCr of 1.26 mg/dL (H)). ? ?Liver Function  Tests: ?Recent Labs  ?Lab 03/25/22 ?1147  ?AST 20  ?ALT 26  ?ALKPHOS 83  ?BILITOT 0.9  ?PROT 7.3  ?ALBUMIN 4.0  ? ? ?Recent Labs  ?Lab 03/25/22 ?1122  ?AMMONIA 19  ? ? ?Coagulation Profile: ?Recent Labs  ?Lab 03/25/22 ?2022  ?INR 1.0  ? ? ?HbA1C: ?HbA1c, POC (controlled  diabetic range)  ?Date/Time Value Ref Range Status  ?01/18/2022 10:56 AM 9.1 (A) 0.0 - 7.0 % Final  ? ?Hgb A1c MFr Bld  ?Date/Time Value Ref Range Status  ?02/07/2019 04:21 PM 7.8 (H) 4.8 - 5.6 % Final  ?  Comment:  ?           Prediabetes: 5.7 - 6.4 ?         Diabetes: >6.4 ?         Glycemic control for adults with diabetes: <7.0 ?  ? ? ?CBG: ?Recent Labs  ?Lab 03/25/22 ?1134 03/26/22 ?0031 03/26/22 ?0759  ?GLUCAP 237* 176* 147*  ? ? ?Scheduled Meds: ?  stroke: early stages of recovery book   Does not apply Once  ? amLODipine  10 mg Oral Daily  ? aspirin  300 mg Rectal Daily  ? Or  ? aspirin  325 mg Oral Daily  ? atorvastatin  40 mg Oral Daily  ? clopidogrel  75 mg Oral Daily  ? heparin  5,000 Units Subcutaneous Q8H  ? insulin aspart  0-15 Units Subcutaneous TID WC  ? insulin aspart  0-5 Units Subcutaneous QHS  ? insulin glargine-yfgn  10 Units Subcutaneous Daily  ? mometasone-formoterol  2 puff Inhalation BID  ? pantoprazole  40 mg Oral Daily  ? thiamine injection  100 mg Intravenous Once  ? thiamine injection  100 mg Intravenous Daily  ? Followed by  ? [START ON 03/28/2022] thiamine  100 mg Oral Daily  ? ? ? LOS: 0 days  ? ?Lonia Blood, MD ?[facility] ?Office  [phone] ?Pager - Text Page per Loretha Stapler ? ?If 7PM-7AM, please contact night-coverage per Amion ?03/26/2022, 10:40 AM ? ? ? ? ?

## 2022-03-26 NOTE — Evaluation (Signed)
Physical Therapy Evaluation ?Patient Details ?Name: Stanley Stone ?MRN: [id number] ?DOB: 1966-04-18 ?Today's Date: 03/26/2022 ? ?History of Present Illness ? Pt is a 57 y.o. male who presented 03/25/22 with R arm weakness x1 week. No acute intracranial abnormalities noted on head CT or MRI, but did note chronic lacunar infarcts left basal ganglia and the left frontal lobe. MRI of cervical spine revealed cervical spine 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 where there is advanced right foraminal impingement and right ventral cord indentation and mild to moderate foraminal narrowing bilaterally at C3-4 and on the left at C4-5. PMH: HTN, DM2, alcohol abuse, COPD ?  ?Clinical Impression ? Pt presents with condition above and deficits mentioned below, see PT Problem List. PTA, he was homeless, living in a tent and functioning independently without DME. Currently, pt displays R upper and lower extremity weakness and deficits in R distal forearm and hand sensation. Pt also with deficits in activity tolerance and balance. He is at risk for falls and benefits from UE support to improve his stability and safety with mobility. Thus, recommending a rollator for improved stability and for energy conservation. He was able to perform all functional mobility without LOB or physical assistance today when using a RW. Educated pt on safe management of a rollator, floor transfers, and on an exercise program (provided handout and putty) to address his deficits. He verbalized understanding. Recommending OPPT services to address his deficits and improve his safety with mobility and ease with functional tasks. Will continue to follow acutely. ?   ? ?Recommendations for follow up therapy are one component of a multi-disciplinary discharge planning process, led by the attending physician.  Recommendations may be updated based on patient status, additional functional criteria and insurance authorization. ? ?Follow Up Recommendations Outpatient  PT ? ?  ?Assistance Recommended at Discharge PRN  ?Patient can return home with the following ? Assist for transportation ? ?  ?Equipment Recommendations Rollator (4 wheels)  ?Recommendations for Other Services ?    ?  ?Functional Status Assessment Patient has had a recent decline in their functional status and demonstrates the ability to make significant improvements in function in a reasonable and predictable amount of time.  ? ?  ?Precautions / Restrictions Precautions ?Precautions: Fall ?Restrictions ?Weight Bearing Restrictions: No  ? ?  ? ?Mobility ? Bed Mobility ?Overal bed mobility: Modified Independent ?  ?  ?  ?  ?  ?  ?General bed mobility comments: Pt able to transition supine <> sit without assistance, HOB slightly elevated ?  ? ?Transfers ?Overall transfer level: Needs assistance ?Equipment used: None ?Transfers: Sit to/from Stand ?Sit to Stand: Min guard ?  ?  ?  ?  ?  ?General transfer comment: Comes to stand with slight trunk sway, min guard for safety. ?  ? ?Ambulation/Gait ?Ambulation/Gait assistance: Min guard ?Gait Distance (Feet): 400 Feet ?Assistive device: None, Rolling walker (2 wheels) ?Gait Pattern/deviations: Step-through pattern, Decreased stride length, Staggering left, Staggering right ?Gait velocity: reduced ?Gait velocity interpretation: <1.8 ft/sec, indicate of risk for recurrent falls ?  ?General Gait Details: Initially ambulated without UE support, with pt staggering L and R but only min guard required for safety. Transitioned to RW with pt displaying improved stability and no longer staggering, min guard for safety. Repeated cues provided to remain within and proximal to RW. Educated pt on keeping rollator proximal and proper use of brakes with transfers for safety, he verbalized understanding and reported no need to try one. ? ?Stairs ?  ?  ?  ?  ?  ? ?  Wheelchair Mobility ?  ? ?Modified Rankin (Stroke Patients Only) ?  ? ?  ? ?Balance Overall balance assessment: Needs  assistance ?Sitting-balance support: No upper extremity supported, Feet supported ?Sitting balance-Leahy Scale: Good ?  ?  ?Standing balance support: Bilateral upper extremity supported, No upper extremity supported, During functional activity ?Standing balance-Leahy Scale: Fair ?Standing balance comment: Able to take steps without UE support but has instability, benefits from UE support. ?  ?  ?  ?  ?  ?  ?  ?  ?  ?  ?  ?   ? ? ? ?Pertinent Vitals/Pain Pain Assessment ?Pain Assessment: Faces ?Faces Pain Scale: No hurt ?Pain Intervention(s): Monitored during session  ? ? ?Home Living Family/patient expects to be discharged to:: Shelter/Homeless ?  ?  ?  ?  ?  ?  ?  ?  ?  ?Additional Comments: Lives alone in a tent. Has friends and a daughter who lives nearby who can assist PRN. Does not own any DME.  ?  ?Prior Function Prior Level of Function : Independent/Modified Independent ?  ?  ?  ?  ?  ?  ?Mobility Comments: Did not use AD. Increased balance deficits over the past few months. ?  ?  ? ? ?Hand Dominance  ?   ? ?  ?Extremity/Trunk Assessment  ? Upper Extremity Assessment ?Upper Extremity Assessment: Defer to OT evaluation;RUE deficits/detail ?RUE Deficits / Details: Decreased elbow flexion and extension strength and decreased grip strength noted; tingling/numbness noted from distal forearm throughout hand, intact superior to distal forearm ?RUE Sensation: decreased light touch ?  ? ?Lower Extremity Assessment ?Lower Extremity Assessment: RLE deficits/detail ?RLE Deficits / Details: Decreased gross strength (gross MMT scores of 4 to 4+) compared to L (MMT score of 5 grossly) ?  ? ?Cervical / Trunk Assessment ?Cervical / Trunk Assessment: Normal  ?Communication  ? Communication: No difficulties  ?Cognition Arousal/Alertness: Awake/alert ?Behavior During Therapy: [facility] for tasks assessed/performed ?Overall Cognitive Status: Within Functional Limits for tasks assessed ?  ?  ?  ?  ?  ?  ?  ?  ?  ?  ?  ?  ?  ?  ?  ?  ?   ?  ?  ? ?  ?General Comments General comments (skin integrity, edema, etc.): Provided putty and MedBridge HEP handout code [id number] to address R UE and lower extremity weakness; educated pt with demonstration on how to transfer from floor as pt sleeps on floor. He verbalized understanding ? ?  ?Exercises    ? ?Assessment/Plan  ?  ?PT Assessment Patient needs continued PT services  ?PT Problem List Decreased strength;Decreased activity tolerance;Decreased balance;Decreased mobility;Decreased knowledge of use of DME;Impaired sensation ? ?   ?  ?PT Treatment Interventions DME instruction;Gait training;Functional mobility training;Therapeutic activities;Therapeutic exercise;Balance training;Neuromuscular re-education;Patient/family education   ? ?PT Goals (Current goals can be found in the Care Plan section)  ?Acute Rehab PT Goals ?Patient Stated Goal: to get better ?PT Goal Formulation: With patient ?Time For Goal Achievement: 04/09/22 ?Potential to Achieve Goals: Good ? ?  ?Frequency Min 3X/week ?  ? ? ?Co-evaluation   ?  ?  ?  ?  ? ? ?  ?AM-PAC PT "6 Clicks" Mobility  ?Outcome Measure Help needed turning from your back to your side while in a flat bed without using bedrails?: None ?Help needed moving from lying on your back to sitting on the side of a flat bed without using bedrails?: None ?Help needed moving to  and from a bed to a chair (including a wheelchair)?: A Little ?Help needed standing up from a chair using your arms (e.g., wheelchair or bedside chair)?: A Little ?Help needed to walk in hospital room?: A Little ?Help needed climbing 3-5 steps with a railing? : A Little ?6 Click Score: 20 ? ?  ?End of Session   ?Activity Tolerance: Patient tolerated treatment well ?Patient left: in bed ?  ?PT Visit Diagnosis: Unsteadiness on feet (R26.81);Other abnormalities of gait and mobility (R26.89);Muscle weakness (generalized) (M62.81);Difficulty in walking, not elsewhere classified (R26.2) ?  ? ?Time: [id number] ?PT  Time Calculation (min) (ACUTE ONLY): 30 min ? ? ?Charges:   PT Evaluation ?$PT Eval Moderate Complexity: 1 Mod ?PT Treatments ?$Therapeutic Activity: 8-22 mins ?  ?   ? ? ?Moishe Spice, PT, DPT ?Acute Rehabilitatio

## 2022-03-26 NOTE — Evaluation (Signed)
Occupational Therapy Evaluation ?Patient Details ?Name: Stanley Stone ?MRN: [id number] ?DOB: 11/20/1965 ?Today's Date: 03/26/2022 ? ? ?History of Present Illness Pt is a 56 y.o. male who presented 03/25/22 with R arm weakness x1 week. No acute intracranial abnormalities noted on head CT or MRI, but did note chronic lacunar infarcts left basal ganglia and the left frontal lobe. MRI of cervical spine revealed cervical spine 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 where there is advanced right foraminal impingement and right ventral cord indentation and mild to moderate foraminal narrowing bilaterally at C3-4 and on the left at C4-5. PMH: HTN, DM2, alcohol abuse, COPD  ? ?Clinical Impression ?  ?Pt admitted for concerns listed above. PTA pt reports that he was independent, however he has been having a decrease in his overall balance. At this time, pt presents with balance deficits, R sided weakness, and some fine motor concerns. He is requiring min guard for all BADL's and functional mobility. Ambulates safest with a rollator/RW for stability. Recommending a rollator, for safety. OT will follow acutely.   ?   ? ?Recommendations for follow up therapy are one component of a multi-disciplinary discharge planning process, led by the attending physician.  Recommendations may be updated based on patient status, additional functional criteria and insurance authorization.  ? ?Follow Up Recommendations ? No OT follow up  ?  ?Assistance Recommended at Discharge Intermittent Supervision/Assistance  ?Patient can return home with the following A little help with walking and/or transfers;A little help with bathing/dressing/bathroom;Direct supervision/assist for medications management;Help with stairs or ramp for entrance ? ?  ?Functional Status Assessment ? Patient has had a recent decline in their functional status and demonstrates the ability to make significant improvements in function in a reasonable and predictable amount of time.   ?Equipment Recommendations ? None recommended by OT  ?  ?Recommendations for Other Services   ? ? ?  ?Precautions / Restrictions Precautions ?Precautions: Fall ?Restrictions ?Weight Bearing Restrictions: No  ? ?  ? ?Mobility Bed Mobility ?Overal bed mobility: Modified Independent ?  ?  ?  ?  ?  ?  ?General bed mobility comments: Pt able to transition supine <> sit without assistance, HOB slightly elevated ?  ? ?Transfers ?Overall transfer level: Needs assistance ?Equipment used: None ?Transfers: Sit to/from Stand ?Sit to Stand: Min guard ?  ?  ?  ?  ?  ?General transfer comment: Comes to stand with slight trunk sway, min guard for safety. ?  ? ?  ?Balance Overall balance assessment: Needs assistance ?Sitting-balance support: No upper extremity supported, Feet supported ?Sitting balance-Leahy Scale: Good ?  ?  ?Standing balance support: No upper extremity supported, During functional activity ?Standing balance-Leahy Scale: Fair ?Standing balance comment: Able to take steps without UE support but has instability, benefits from UE support. ?  ?  ?  ?  ?  ?  ?  ?  ?  ?  ?  ?   ? ?ADL either performed or assessed with clinical judgement  ? ?ADL Overall ADL's : Needs assistance/impaired ?  ?  ?  ?  ?  ?  ?  ?  ?  ?  ?  ?  ?  ?  ?  ?  ?  ?  ?  ?General ADL Comments: Min guard overall due to balance and mild decreased abilities in RUE  ? ? ? ?Vision Baseline Vision/History: 2 Legally blind ?Ability to See in Adequate Light: 2 Moderately impaired ?Patient Visual Report: No change from baseline ?Vision  Assessment?: No apparent visual deficits ?Additional Comments: Pt is completely blind in R eye, reports that his vision in his L eye is getting weaker  ?   ?Perception   ?  ?Praxis   ?  ? ?Pertinent Vitals/Pain Pain Assessment ?Pain Assessment: No/denies pain  ? ? ? ?Hand Dominance Right ?  ?Extremity/Trunk Assessment Upper Extremity Assessment ?Upper Extremity Assessment: RUE deficits/detail ?RUE Deficits / Details: Decreased  elbow flexion and extension strength and decreased grip strength noted; tingling/numbness noted from distal forearm throughout hand, intact superior to distal forearm ?RUE Sensation: decreased light touch ?RUE Coordination: decreased fine motor ?  ?Lower Extremity Assessment ?Lower Extremity Assessment: Defer to PT evaluation ?RLE Deficits / Details: Decreased gross strength (gross MMT scores of 4 to 4+) compared to L (MMT score of 5 grossly) ?  ?Cervical / Trunk Assessment ?Cervical / Trunk Assessment: Normal ?  ?Communication Communication ?Communication: No difficulties ?  ?Cognition Arousal/Alertness: Awake/alert ?Behavior During Therapy: [facility] for tasks assessed/performed ?Overall Cognitive Status: Within Functional Limits for tasks assessed ?  ?  ?  ?  ?  ?  ?  ?  ?  ?  ?  ?  ?  ?  ?  ?  ?  ?  ?  ?General Comments  VSS on RA ? ?  ?Exercises   ?  ?Shoulder Instructions    ? ? ?Home Living Family/patient expects to be discharged to:: Shelter/Homeless ?  ?  ?  ?  ?  ?  ?  ?  ?  ?  ?  ?  ?  ?  ?  ?  ?Additional Comments: Lives alone in a tent. Has friends and a daughter who lives nearby who can assist PRN. Does not own any DME. ?  ? ?  ?Prior Functioning/Environment Prior Level of Function : Independent/Modified Independent ?  ?  ?  ?  ?  ?  ?Mobility Comments: Did not use AD. Increased balance deficits over the past few months. ?  ?  ? ?  ?  ?OT Problem List: Decreased strength;Decreased activity tolerance;Impaired balance (sitting and/or standing);Impaired UE functional use;Impaired vision/perception ?  ?   ?OT Treatment/Interventions: Self-care/ADL training;Therapeutic exercise;Energy conservation;DME and/or AE instruction;Therapeutic activities;Patient/family education;Balance training  ?  ?OT Goals(Current goals can be found in the care plan section) Acute Rehab OT Goals ?Patient Stated Goal: To be able to take care of himself ?OT Goal Formulation: With patient ?Time For Goal Achievement: 04/09/22 ?Potential to  Achieve Goals: Good ?ADL Goals ?Pt/caregiver will Perform Home Exercise Program: Increased strength;Both right and left upper extremity;With theraputty;Independently;With written HEP provided ?Additional ADL Goal #1: Pt will maintain dynamic standing balance for 5 mins during functional ADL's. ?Additional ADL Goal #2: Pt will complete a medication management task with no errors.  ?OT Frequency: Min 2X/week ?  ? ?Co-evaluation   ?  ?  ?  ?  ? ?  ?AM-PAC OT "6 Clicks" Daily Activity     ?Outcome Measure Help from another person eating meals?: None ?Help from another person taking care of personal grooming?: A Little ?Help from another person toileting, which includes using toliet, bedpan, or urinal?: A Little ?Help from another person bathing (including washing, rinsing, drying)?: A Little ?Help from another person to put on and taking off regular upper body clothing?: A Little ?Help from another person to put on and taking off regular lower body clothing?: A Little ?6 Click Score: 19 ?  ?End of Session Equipment Utilized During Treatment: Gait  belt ?Nurse Communication: Mobility status ? ?Activity Tolerance: Patient tolerated treatment well ?Patient left: in bed;with call bell/phone within reach ? ?OT Visit Diagnosis: Unsteadiness on feet (R26.81);Other abnormalities of gait and mobility (R26.89);Muscle weakness (generalized) (M62.81)  ?              ?Time: [id number] ?OT Time Calculation (min): 18 min ?Charges:  OT General Charges ?$OT Visit: 1 Visit ?OT Evaluation ?$OT Eval Moderate Complexity: 1 Mod ? ?Odyssey Vasbinder H., OTR/L ?Acute Rehabilitation ? ?Laurie Lovejoy Elane Bing PlumeHaynes ?03/26/2022, 4:15 PM ?

## 2022-03-27 ENCOUNTER — Observation Stay (HOSPITAL_BASED_OUTPATIENT_CLINIC_OR_DEPARTMENT_OTHER): Payer: Medicaid Other

## 2022-03-27 DIAGNOSIS — R200 Anesthesia of skin: Secondary | ICD-10-CM

## 2022-03-27 DIAGNOSIS — G459 Transient cerebral ischemic attack, unspecified: Secondary | ICD-10-CM

## 2022-03-27 DIAGNOSIS — Z5900 Homelessness unspecified: Secondary | ICD-10-CM | POA: Diagnosis not present

## 2022-03-27 DIAGNOSIS — F1011 Alcohol abuse, in remission: Secondary | ICD-10-CM | POA: Diagnosis not present

## 2022-03-27 DIAGNOSIS — Z8673 Personal history of transient ischemic attack (TIA), and cerebral infarction without residual deficits: Secondary | ICD-10-CM

## 2022-03-27 DIAGNOSIS — I10 Essential (primary) hypertension: Secondary | ICD-10-CM | POA: Diagnosis not present

## 2022-03-27 LAB — COMPREHENSIVE METABOLIC PANEL
ALT: 26 U/L (ref 0–44)
AST: 17 U/L (ref 15–41)
Albumin: 3.4 g/dL — ABNORMAL LOW (ref 3.5–5.0)
Alkaline Phosphatase: 71 U/L (ref 38–126)
Anion gap: 8 (ref 5–15)
BUN: 19 mg/dL (ref 6–20)
CO2: 27 mmol/L (ref 22–32)
Calcium: 8.9 mg/dL (ref 8.9–10.3)
Chloride: 102 mmol/L (ref 98–111)
Creatinine, Ser: 1.3 mg/dL — ABNORMAL HIGH (ref 0.61–1.24)
GFR, Estimated: >60 mL/min (ref >60)
Glucose, Bld: 136 mg/dL — ABNORMAL HIGH (ref 70–99)
Potassium: 3.1 mmol/L — ABNORMAL LOW (ref 3.5–5.1)
Sodium: 137 mmol/L (ref 135–145)
Total Bilirubin: 0.4 mg/dL (ref 0.3–1.2)
Total Protein: 6.3 g/dL — ABNORMAL LOW (ref 6.5–8.1)

## 2022-03-27 LAB — GLUCOSE, CAPILLARY
Glucose-Capillary: 139 mg/dL — ABNORMAL HIGH (ref 70–99)
Glucose-Capillary: 189 mg/dL — ABNORMAL HIGH (ref 70–99)
Glucose-Capillary: 119 mg/dL — ABNORMAL HIGH (ref 70–99)
Glucose-Capillary: 246 mg/dL — ABNORMAL HIGH (ref 70–99)

## 2022-03-27 LAB — CBC
HCT: 38.2 % — ABNORMAL LOW (ref 39.0–52.0)
Hemoglobin: 13.3 g/dL (ref 13.0–17.0)
MCH: 30.6 pg (ref 26.0–34.0)
MCHC: 34.8 g/dL (ref 30.0–36.0)
MCV: 87.8 fL (ref 80.0–100.0)
Platelets: 223 10*3/uL (ref 150–400)
RBC: 4.35 MIL/uL (ref 4.22–5.81)
RDW: 12.7 % (ref 11.5–15.5)
WBC: 8.6 10*3/uL (ref 4.0–10.5)
nRBC: 0 % (ref 0.0–0.2)

## 2022-03-27 LAB — ECHOCARDIOGRAM COMPLETE
AR max vel: 2.99 cm2
AV Area VTI: 3.26 cm2
AV Area mean vel: 3.21 cm2
AV Mean grad: 2 mmHg
AV Peak grad: 3.8 mmHg
Ao pk vel: 0.98 m/s
Area-P 1/2: 3.99 cm2
Height: 65 in
P 1/2 time: 731 msec
S' Lateral: 2.6 cm
Weight: 2854.4 oz

## 2022-03-27 LAB — MAGNESIUM: Magnesium: 2.2 mg/dL (ref 1.7–2.4)

## 2022-03-27 MED ORDER — TRAMADOL HCL 50 MG PO TABS
50.0000 mg | ORAL_TABLET | Freq: Four times a day (QID) | ORAL | Status: DC | PRN
  Administered 2022-03-27 – 2022-03-28 (×2): 50 mg via ORAL
  Filled 2022-03-27 (×2): qty 1

## 2022-03-27 NOTE — Progress Notes (Signed)
?  Echocardiogram ?2D Echocardiogram has been performed. ? ?Stanley Stone ?03/27/2022, 10:22 AM ?

## 2022-03-27 NOTE — Discharge Instructions (Signed)
                  Intensive Outpatient Programs  [facility] [place] [facility]    [facility] [address]     [address] [place],  [place]     [place], [place] [phone]      [phone]  [facility]   [facility]  (Inpatient and outpatient)  [phone] (Suboxone and Methadone) [address]           [phone]           [facility]    [facility] - Intensive Outpatient [address]     [address] [place], [place] [address]     [place], [place]  [phone]      [phone]  [facility] (Outpatient, Inpatient, [facility]  [facility] (Groups and [facility]) (insurance only) [phone]    [place], [place]          [phone]       [facility]    [facility] (for caregivers and family) [address]     [address] [place], [place]     [place], [place] [phone]      [phone]  Residential Treatment Programs  [facility]  [facility](2 years) Residential: 90 days)  [facility] [address]      [address] [place], [place]     [place], [place] [phone]      [phone] or [phone]  [facility]    [facility] [address]      [address] [place], [place]     [place], [place] [phone]      [phone]  [facility]   [facility] ([facility] [address]     [address] [place], [place] [address]     [place], [place] [phone]      [phone] Admissions: 8am-3pm M-F  [facility]: Residential Program (90 Days)              [facility]: [facility]  [place],      [place],   [phone] or [phone]    (Walk in Hours over the weekend or by referral)   [facility]: [facility]:[phone] (for crisis  response 24 hours a day) 

## 2022-03-27 NOTE — Progress Notes (Signed)
STROKE TEAM PROGRESS NOTE  ? ?SUBJECTIVE (INTERVAL HISTORY) ?No family is at the bedside.  Pt lying in bed, complaining of neck pain. No acute neuro change.   ? ? ?OBJECTIVE ?Temp:  [97.8 ?F (36.6 ?C)-98.4 ?F (36.9 ?C)] 97.8 ?F (36.6 ?C) (05/14 1211) ?Pulse Rate:  [65-94] 94 (05/14 1211) ?Cardiac Rhythm: Normal sinus rhythm (05/14 0828) ?Resp:  [14-22] 22 (05/14 1211) ?BP: (97-130)/(61-89) 100/89 (05/14 1211) ?SpO2:  [91 %-98 %] 92 % (05/14 1211) ? ?Recent Labs  ?Lab 03/26/22 ?1241 03/26/22 ?1821 03/26/22 ?2207 03/27/22 ?0735 03/27/22 ?1210  ?GLUCAP 100* 112* 239* 139* 189*  ? ?Recent Labs  ?Lab 03/25/22 ?1147 03/27/22 ?0104  ?NA 139 137  ?K 3.8 3.1*  ?CL 100 102  ?CO2 30 27  ?GLUCOSE 226* 136*  ?BUN 24* 19  ?CREATININE 1.26* 1.30*  ?CALCIUM 9.5 8.9  ?MG  --  2.2  ? ?Recent Labs  ?Lab 03/25/22 ?1147 03/27/22 ?0104  ?AST 20 17  ?ALT 26 26  ?ALKPHOS 83 71  ?BILITOT 0.9 0.4  ?PROT 7.3 6.3*  ?ALBUMIN 4.0 3.4*  ? ?Recent Labs  ?Lab 03/25/22 ?1147 03/27/22 ?0104  ?WBC 10.0 8.6  ?NEUTROABS 6.8  --   ?HGB 13.8 13.3  ?HCT 41.3 38.2*  ?MCV 90.6 87.8  ?PLT 254 223  ? ?No results for input(s): CKTOTAL, CKMB, CKMBINDEX, TROPONINI in the last 168 hours. ?Recent Labs  ?  03/25/22 ?2022  ?LABPROT 12.5  ?INR 1.0  ? ?No results for input(s): COLORURINE, LABSPEC, PHURINE, GLUCOSEU, HGBUR, BILIRUBINUR, KETONESUR, PROTEINUR, UROBILINOGEN, NITRITE, LEUKOCYTESUR in the last 72 hours. ? ?Invalid input(s): APPERANCEUR  ?   ?Component Value Date/Time  ? CHOL 153 03/26/2022 1043  ? CHOL 241 (H) 01/18/2022 1109  ? TRIG 206 (H) 03/26/2022 1043  ? HDL 36 (L) 03/26/2022 1043  ? HDL 46 01/18/2022 1109  ? CHOLHDL 4.3 03/26/2022 1043  ? VLDL 41 (H) 03/26/2022 1043  ? LDLCALC 76 03/26/2022 1043  ? LDLCALC 119 (H) 01/18/2022 1109  ? ?Lab Results  ?Component Value Date  ? HGBA1C 8.2 (H) 03/26/2022  ? ?No results found for: LABOPIA, COCAINSCRNUR, LABBENZ, AMPHETMU, THCU, LABBARB  ?Recent Labs  ?Lab 03/25/22 ?1122  ?ETH <10  ? ? ?I have personally  reviewed the radiological images below and agree with the radiology interpretations. ? ?CT Head Wo Contrast ? ?Result Date: 03/25/2022 ?CLINICAL DATA:  Neurological deficit, slurred speech and RIGHT arm numbness for 1 week, question stroke EXAM: CT HEAD WITHOUT CONTRAST TECHNIQUE: Contiguous axial images were obtained from the base of the skull through the vertex without intravenous contrast. RADIATION DOSE REDUCTION: This exam was performed according to the departmental dose-optimization program which includes automated exposure control, adjustment of the mA and/or kV according to patient size and/or use of iterative reconstruction technique. COMPARISON:  None Available. FINDINGS: Brain: Mild atrophy. Normal ventricular morphology. No midline shift or mass effect. Old lacunar infarct LEFT caudate head. Mild small vessel chronic ischemic changes a deep cerebral white matter. No intracranial hemorrhage, mass lesion or additional infarction. Vascular: No hyperdense vessels Skull: Intact Sinuses/Orbits: Clear Other: N/A IMPRESSION: Atrophy with small vessel chronic ischemic changes a deep cerebral white matter. Old lacunar infarct LEFT caudate head. No acute intracranial abnormalities. Electronically Signed   By: Ulyses Southward M.D.   On: 03/25/2022 12:03  ? ?MR ANGIO HEAD WO CONTRAST ? ?Result Date: 03/26/2022 ?CLINICAL DATA:  Neuro deficit.  Right arm weakness. EXAM: MRA HEAD WITHOUT CONTRAST TECHNIQUE: Angiographic images of the Circle of Willis  were acquired using MRA technique without intravenous contrast. COMPARISON:  Brain MRI from yesterday FINDINGS: Anterior circulation: Severe intracranial atherosclerosis. Atheromatous irregularity of the bilateral cavernous carotids accentuated by skull base artifact. Diffuse atheromatous type irregularity of intracranial branches with advanced right M1 stenosis and left M1 occlusion with flow gap and faint downstream flow. The occlusion is considered chronic based on the absence  of acute infarct. Negative for aneurysm. Posterior circulation: Atheromatous irregularity of vertebral and basilar arteries, greatest at the left V4 segment, which is non dominant. Extensive atheromatous irregularity of bilateral posterior cerebral arteries with moderate left P2 segment stenosis and advanced bilateral PCA branch stenoses. Anatomic variants: Hypoplastic right A1 segment. IMPRESSION: Severe intracranial atherosclerosis with left MCA occlusion. Electronically Signed   By: Tiburcio PeaJonathan  Watts M.D.   On: 03/26/2022 06:25  ? ?MR ANGIO NECK WO CONTRAST ? ?Result Date: 03/26/2022 ?CLINICAL DATA:  Right arm weakness EXAM: MRA NECK WITHOUT CONTRAST TECHNIQUE: Angiographic images of the neck were acquired using MRA technique without intravenous contrast. Carotid stenosis measurements (when applicable) are obtained utilizing NASCET criteria, using the distal internal carotid diameter as the denominator. COMPARISON:  Brain MRI from yesterday FINDINGS: Aortic arch: Essentially not covered. Right carotid system: Smoothly contoured common and internal carotid arteries which are widely patent Left carotid system: Smoothly contoured common and internal carotid arteries which are widely patent. Vertebral arteries: Dominant on the right. No proximal subclavian or vertebral stenosis. Negative for beading. IMPRESSION: Negative MRA of the neck. Electronically Signed   By: Tiburcio PeaJonathan  Watts M.D.   On: 03/26/2022 06:08  ? ?MR BRAIN WO CONTRAST ? ?Result Date: 03/25/2022 ?CLINICAL DATA:  Stroke follow-up. Right-sided numbness, weakness, and difficulty speaking. EXAM: MRI HEAD WITHOUT CONTRAST TECHNIQUE: Multiplanar, multiecho pulse sequences of the brain and surrounding structures were obtained without intravenous contrast. COMPARISON:  Head CT 03/25/2022 FINDINGS: Brain: There is no evidence of an acute infarct, mass, midline shift, or extra-axial fluid collection. A chronic infarct is noted anteriorly in the left basal ganglia  with associated chronic blood products and ex vacuo dilatation of the frontal horn of the left lateral ventricle. T2 hyperintensities elsewhere in the cerebral white matter bilaterally are nonspecific but compatible with mild chronic small vessel ischemic disease. A small chronic infarct is noted in the left frontal white matter at the level of the centrum semiovale. There is mild cerebral and mild-to-moderate cerebellar atrophy. Vascular: Major intracranial vascular flow voids are preserved. Skull and upper cervical spine: Unremarkable bone marrow signal. Sinuses/Orbits: Unremarkable orbits. Mild mucosal thickening/small mucous retention cysts in the paranasal sinuses. Small right mastoid effusion. Other: None. IMPRESSION: 1. No acute intracranial abnormality. 2. Mild chronic small vessel ischemic disease with chronic infarcts in the left basal ganglia and left frontal white matter. 3. Cerebral and cerebellar atrophy. Electronically Signed   By: Sebastian AcheAllen  Grady M.D.   On: 03/25/2022 18:37  ? ?MR CERVICAL SPINE WO CONTRAST ? ?Result Date: 03/26/2022 ?CLINICAL DATA:  One-week history of right hand weakness EXAM: MRI CERVICAL SPINE WITHOUT CONTRAST TECHNIQUE: Multiplanar, multisequence MR imaging of the cervical spine was performed. No intravenous contrast was administered. COMPARISON:  None Available. FINDINGS: Alignment: Physiologic. Vertebrae: No fracture, evidence of discitis, or bone lesion. Cord: Normal signal and morphology. Posterior Fossa, vertebral arteries, paraspinal tissues: Negative. Disc levels: C2-3: Mild left facet and uncovertebral spurring. Mild left foraminal stenosis C3-4: Disc narrowing with bulging and uncovertebral ridging. Bilateral facet spurring. Mild to moderate bilateral foraminal stenosis. Mild spinal stenosis C4-5: Disc narrowing and bulging with endplate  and uncovertebral ridging eccentric to the right. Advanced right foraminal stenosis. Mild to moderate left foraminal narrowing. A right  paracentral disc osteophyte complex indents the right ventral cord C5-6: Disc narrowing and bulging. Mild spurring. Small left paracentral protrusion. No neural compression C6-7: Mild disc bulging and endplate

## 2022-03-27 NOTE — TOC Initial Note (Addendum)
Transition of Care (TOC) - Initial/Assessment Note  ? ? ?Patient Details  ?Name: Stanley Stone ?MRN: [id number] ?Date of Birth: 12/12/1965 ? ?Transition of Care (TOC) CM/SW Contact:    ?Lockie Pares, RN ?Phone Number: ?03/27/2022, 3:20 PM ? ?Clinical Narrative:                 ?Patient presented to [facility] for  right arm weakness.  Transferred to [facility] for workup.MRI reveals lac infarcts occlusion of MCA. PT evaluation recommended 4 wheeled rolling walker with seat. Patient is homeless uninsured history of Smoker and ETOH . ?Consult placed for OP PT in [place]. Patient does have a PCP Stanley Stone at [facility].  SA resources placed in AVS.  ?CM to follow for further needs, recommendations, and transitions, may need assistance with transport post hospitalization. Emailed FC with information for assistance with Medicaid screening.  ?Please send DC [facility] to [facility] pharmacy ?Expected Discharge Plan: Home/Self Care (OP PT) ?Barriers to Discharge: Inadequate or no insurance ? ? ?Patient Goals and CMS Choice ?  ?  ?  ? ?Expected Discharge Plan and Services ?Expected Discharge Plan: Home/Self Care (OP PT) ?  ?  ?Post Acute Care Choice: Home Health ?Living arrangements for the past 2 months: Homeless ?                ?DME Arranged: Walker rolling with seat ?DME Agency: [employer] ?Date DME Agency Contacted: 03/27/22 ?Time DME Agency Contacted: [phone] ?Representative spoke with at DME Agency: Leavy Cella ?HH Arranged:  (Outpatient PT at [facility] [place]) ?  ?  ?  ?  ? ?Prior Living Arrangements/Services ?Living arrangements for the past 2 months: Homeless ?  ?Patient language and need for interpreter reviewed:: Yes ?       ?Need for Family Participation in Patient Care: Yes (Comment) ?Care giver support system in place?: Yes (comment) ?  ?Criminal Activity/Legal Involvement Pertinent to Current Situation/Hospitalization: No - Comment as needed ? ?Activities of Daily Living ?  ?  ? ?Permission Sought/Granted ?  ?  ?   ?   ?   ?    ? ?Emotional Assessment ?  ?  ?  ?Orientation: : Oriented to Self, Oriented to Place ?Alcohol / Substance Use: Alcohol Use ?Psych Involvement: No (comment) ? ?Admission diagnosis:  Aphasia [R47.01] ?Right arm weakness [R29.898] ?Right sided weakness [R53.1] ?Right sided numbness [R20.0] ?Patient Active Problem List  ? Diagnosis Date Noted  ? Right arm weakness 03/25/2022  ? Anxiety state 02/18/2022  ? Onychomycosis 01/18/2022  ? COPD with chronic bronchitis (HCC) 01/18/2022  ? DM (diabetes mellitus), type 2 (HCC) 02/07/2019  ? HTN (hypertension) 02/07/2019  ? History of alcohol abuse 02/07/2019  ? Homelessness 02/07/2019  ? Tobacco use 02/07/2019  ? ?PCP:  Stanley Frisk, MD ?Pharmacy:   ?[facility] ?[address], [address] ?[place] [address] ?Phone: [phone] Fax: [phone] ? ? ? ? ?Social Determinants of Health (SDOH) Interventions ?  ? ?Readmission Risk Interventions ?   ? View : No data to display.  ?  ?  ?  ? ? ? ?

## 2022-03-27 NOTE — Progress Notes (Signed)
? Stanley Stone  WOE:[id number] DOB: Nov 02, 1966 DOA: 03/25/2022 ?PCP: [name], MD   ? ?Brief Narrative:  ?56yo homeless gentleman with a history of poor visual acuity (blind in right eye, blurry vision in left) HTN, DM 2, tobacco abuse, and alcohol abuse who presented to the ER with a 1 week history of right hand weakness.  Since this initial onset it has waxed and waned in an unpredictable fashion ? ?Consultants:  ?Neurology ? ?Goals of Care:  Code Status: Full Code  ? ?DVT prophylaxis: ?Subcutaneous heparin ? ?Interim Hx: ?Afebrile.  Vital signs stable.  No acute events reported overnight.  No follow-up recommended by OT, and PT is suggesting outpatient PT. Continues to have good use of his right upper extremity and hand at this time.  Denies chest pain or shortness of breath.  Complains of low back pain which he blames on the hospital bed/stretcher. ? ?Assessment & Plan: ? ?Right arm weakness ?Left brain hypoperfusion versus TIA due to left MCA occlusion/large vessel disease ?-CT head without acute findings ?-MRI brain negative for acute/subacute CVA -mild chronic small vessel ischemic disease noted with cerebral and cerebellar atrophy ?-MRa head notes severe intracranial atherosclerosis with a left MCA occlusion ?-MRa neck negative ?-MRI C-spine notes cervical spine 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 with right foraminal impingement and right ventral cord indentation ?-TTE notes EF 60-65% with normal LV function and no WMA with grade 1 DD and no intracardiac source of embolism appreciable ?-LDL 76 -continue Lipitor without change ?-A1c 8.2 ?Neurology recommending ASA 325 mg plus Plavix for 3 months, and avoidance of hypotension ? ?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 with right foraminal impingement and right ventral cord indentation ?[name] - Neurology recommending outpatient EMGs -symptoms have spontaneously resolved  during his hospital stay ? ?Vitamin B12 deficiency ?Vitamin B12 quite low at 228 - supplement with subcutaneous load and oral replacement after discharge ? ?Hypokalemia ?Likely due to poor nutritional status -supplement and recheck in a.m. ? ?DM2 ?A1c 8.2 -CBG well controlled as inpatient thus far  ? ?COPD with chronic bronchitis  ?No acute exacerbation ? ?Tobacco use ?Discussed need for absolute tobacco cessation with patient -utilize nicotine patch for now ? ?Homelessness ?Chronically homeless. Pt lives in a tent. ? ?History of alcohol abuse ?Pt has not had any alcohol in 5 days -  ? ?HTN ?Avoid overcorrection/hypotension in setting of significant intracranial vascular disease ? ?Family Communication: No family present at time of exam ?Disposition: Patient is homeless -will likely be medically ready for discharge within next 24 hours ? ? ?Objective: ?Blood pressure 102/82, pulse 68, temperature 97.8 ?F (36.6 ?C), temperature source Oral, resp. rate (!) 21, height 5\' 5"  (1.651 m), weight 80.9 kg, SpO2 95 %. ? ?Intake/Output Summary (Last 24 hours) at 03/27/2022 1027 ?Last data filed at 03/27/2022 0540 ?Gross per 24 hour  ?Intake 720 ml  ?Output 1150 ml  ?Net -430 ml  ? ?Filed Weights  ? 03/25/22 1129  ?Weight: 80.9 kg  ? ? ?Examination: ?General: No acute respiratory distress ?Lungs: Clear to auscultation bilaterally -no wheezing ?Cardiovascular: Regular rate and rhythm without murmur  ?Abdomen: Nontender, nondistended, soft, bowel sounds positive, no rebound, no ascites, no appreciable mass ?Extremities: No significant cyanosis, clubbing, or edema bilateral lower extremities ? ?CBC: ?Recent Labs  ?Lab 03/25/22 ?1147 03/27/22 ?0104  ?WBC 10.0 8.6  ?NEUTROABS 6.8  --   ?HGB 13.8 13.3  ?HCT 41.3 38.2*  ?  MCV 90.6 87.8  ?PLT 254 223  ? ? ?Basic Metabolic Panel: ?Recent Labs  ?Lab 03/25/22 ?1147 03/27/22 ?0104  ?NA 139 137  ?K 3.8 3.1*  ?CL 100 102  ?CO2 30 27  ?GLUCOSE 226* 136*  ?BUN 24* 19  ?CREATININE 1.26* 1.30*   ?CALCIUM 9.5 8.9  ?MG  --  2.2  ? ? ?GFR: ?Estimated Creatinine Clearance: 62.2 mL/min (A) (by C-G formula based on SCr of 1.3 mg/dL (H)). ? ?Liver Function Tests: ?Recent Labs  ?Lab 03/25/22 ?1147 03/27/22 ?0104  ?AST 20 17  ?ALT 26 26  ?ALKPHOS 83 71  ?BILITOT 0.9 0.4  ?PROT 7.3 6.3*  ?ALBUMIN 4.0 3.4*  ? ? ?Recent Labs  ?Lab 03/25/22 ?1122  ?AMMONIA 19  ? ? ?Coagulation Profile: ?Recent Labs  ?Lab 03/25/22 ?2022  ?INR 1.0  ? ? ?HbA1C: ?HbA1c, POC (controlled diabetic range)  ?Date/Time Value Ref Range Status  ?01/18/2022 10:56 AM 9.1 (A) 0.0 - 7.0 % Final  ? ?Hgb A1c MFr Bld  ?Date/Time Value Ref Range Status  ?03/26/2022 10:43 AM 8.2 (H) 4.8 - 5.6 % Final  ?  Comment:  ?  (NOTE) ?Pre diabetes:          5.7%-6.4% ? ?Diabetes:              >6.4% ? ?Glycemic control for   <7.0% ?adults with diabetes ?  ?02/07/2019 04:21 PM 7.8 (H) 4.8 - 5.6 % Final  ?  Comment:  ?           Prediabetes: 5.7 - 6.4 ?         Diabetes: >6.4 ?         Glycemic control for adults with diabetes: <7.0 ?  ? ? ?Scheduled Meds: ?  stroke: early stages of recovery book   Does not apply Once  ? amLODipine  10 mg Oral Daily  ? aspirin  325 mg Oral Daily  ? atorvastatin  40 mg Oral Daily  ? clopidogrel  75 mg Oral Daily  ? cyanocobalamin  1,000 mcg Subcutaneous Daily  ? heparin  5,000 Units Subcutaneous Q8H  ? insulin aspart  0-15 Units Subcutaneous TID WC  ? insulin aspart  0-5 Units Subcutaneous QHS  ? insulin glargine-yfgn  10 Units Subcutaneous Daily  ? mometasone-formoterol  2 puff Inhalation BID  ? nicotine  21 mg Transdermal Daily  ? pantoprazole  40 mg Oral Daily  ? thiamine injection  100 mg Intravenous Once  ? [START ON 03/28/2022] thiamine  100 mg Oral Daily  ? ? ? LOS: 0 days  ? ?Lonia Blood, MD ?[facility] ?Office  [phone] ?Pager - Text Page per Loretha Stapler ? ?If 7PM-7AM, please contact night-coverage per Amion ?03/27/2022, 10:27 AM ? ? ? ? ?

## 2022-03-28 ENCOUNTER — Other Ambulatory Visit (HOSPITAL_COMMUNITY): Payer: Self-pay

## 2022-03-28 DIAGNOSIS — R29898 Other symptoms and signs involving the musculoskeletal system: Secondary | ICD-10-CM | POA: Diagnosis not present

## 2022-03-28 LAB — BASIC METABOLIC PANEL
Anion gap: 7 (ref 5–15)
BUN: 22 mg/dL — ABNORMAL HIGH (ref 6–20)
CO2: 28 mmol/L (ref 22–32)
Calcium: 9 mg/dL (ref 8.9–10.3)
Chloride: 105 mmol/L (ref 98–111)
Creatinine, Ser: 1.52 mg/dL — ABNORMAL HIGH (ref 0.61–1.24)
GFR, Estimated: 53 mL/min — ABNORMAL LOW (ref >60)
Glucose, Bld: 150 mg/dL — ABNORMAL HIGH (ref 70–99)
Potassium: 3.8 mmol/L (ref 3.5–5.1)
Sodium: 140 mmol/L (ref 135–145)

## 2022-03-28 LAB — GLUCOSE, CAPILLARY
Glucose-Capillary: 144 mg/dL — ABNORMAL HIGH (ref 70–99)
Glucose-Capillary: 203 mg/dL — ABNORMAL HIGH (ref 70–99)

## 2022-03-28 LAB — MAGNESIUM: Magnesium: 2.2 mg/dL (ref 1.7–2.4)

## 2022-03-28 MED ORDER — ASPIRIN 325 MG PO TABS
325.0000 mg | ORAL_TABLET | Freq: Every day | ORAL | 2 refills | Status: AC
  Filled 2022-03-28: qty 30, 30d supply, fill #0

## 2022-03-28 MED ORDER — CLOPIDOGREL BISULFATE 75 MG PO TABS
75.0000 mg | ORAL_TABLET | Freq: Every day | ORAL | 2 refills | Status: DC
  Filled 2022-03-28: qty 30, 30d supply, fill #0

## 2022-03-28 MED ORDER — NICOTINE 21 MG/24HR TD PT24
21.0000 mg | MEDICATED_PATCH | Freq: Every day | TRANSDERMAL | 0 refills | Status: DC
  Filled 2022-03-28: qty 14, 14d supply, fill #0

## 2022-03-28 MED ORDER — TRAMADOL HCL 50 MG PO TABS: 50.0000 mg | ORAL_TABLET | Freq: Four times a day (QID) | ORAL | 0 refills | Status: DC | PRN

## 2022-03-28 MED ORDER — VITAMIN B-12 1000 MCG PO TABS
1000.0000 ug | ORAL_TABLET | Freq: Every day | ORAL | 2 refills | Status: DC
  Filled 2022-03-28: qty 30, 30d supply, fill #0

## 2022-03-28 MED ORDER — THIAMINE HCL 100 MG PO TABS
100.0000 mg | ORAL_TABLET | Freq: Every day | ORAL | 0 refills | Status: DC
  Filled 2022-03-28: qty 30, 30d supply, fill #0

## 2022-03-28 NOTE — Progress Notes (Signed)
Occupational Therapy Treatment ?Patient Details ?Name: Stanley Stone ?MRN: [id number] ?DOB: [birth year] ?Today's Date: 03/28/2022 ? ? ?History of present illness Pt is a 56 y.o. male who presented 03/25/22 with R arm weakness x1 week. No acute intracranial abnormalities noted on head CT or MRI, but did note chronic lacunar infarcts left basal ganglia and the left frontal lobe. MRI of cervical spine revealed cervical spine 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 where there is advanced right foraminal impingement and right ventral cord indentation and mild to moderate foraminal narrowing bilaterally at C3-4 and on the left at C4-5. PMH: HTN, DM2, alcohol abuse, COPD ?  ?OT comments ? Pt is functioning modified independently in ADLs and mobility with rollator. 5/5 strength in R UE.  ? ?Recommendations for follow up therapy are one component of a multi-disciplinary discharge planning process, led by the attending physician.  Recommendations may be updated based on patient status, additional functional criteria and insurance authorization. ?   ?Follow Up Recommendations ? No OT follow up  ?  ?Assistance Recommended at Discharge PRN  ?Patient can return home with the following ? Assist for transportation ?  ?Equipment Recommendations ? None recommended by OT  ?  ?Recommendations for Other Services   ? ?  ?Precautions / Restrictions Precautions ?Precautions: Fall ?Restrictions ?Weight Bearing Restrictions: No  ? ? ?  ? ?Mobility Bed Mobility ?Overal bed mobility: Modified Independent ?  ?  ?  ?  ?  ?  ?General bed mobility comments: increased time ?  ? ?Transfers ?Overall transfer level: Modified independent ?Equipment used: Rollator (4 wheels) ?  ?  ?  ?  ?  ?  ?  ?  ?  ?  ?Balance   ?  ?Sitting balance-Leahy Scale: Good ?  ?  ?  ?Standing balance-Leahy Scale: Fair ?Standing balance comment: can pick items up from floor holding rollator ?  ?  ?  ?  ?  ?  ?  ?  ?  ?  ?  ?   ? ?ADL either performed or assessed with clinical  judgement  ? ?ADL Overall ADL's : Modified independent ?  ?  ?  ?  ?  ?  ?  ?  ?  ?  ?  ?  ?  ?  ?  ?  ?  ?  ?  ?  ?  ? ?Extremity/Trunk Assessment Upper Extremity Assessment ?Upper Extremity Assessment: RUE deficits/detail ?RUE Deficits / Details: back to baseline 5/5 strength ?  ?  ?  ?  ?  ? ?Vision   ?  ?  ?Perception   ?  ?Praxis   ?  ? ?Cognition Arousal/Alertness: Awake/alert ?Behavior During Therapy: [facility] for tasks assessed/performed ?Overall Cognitive Status: Within Functional Limits for tasks assessed ?  ?  ?  ?  ?  ?  ?  ?  ?  ?  ?  ?  ?  ?  ?  ?  ?  ?  ?  ?   ?Exercises   ? ?  ?Shoulder Instructions   ? ? ?  ?General Comments    ? ? ?Pertinent Vitals/ Pain       Pain Assessment ?Pain Assessment: Faces ?Faces Pain Scale: Hurts a little bit ?Pain Location: neck ?Pain Descriptors / Indicators: Discomfort ?Pain Intervention(s): Patient requesting pain meds-RN notified ? ?Home Living   ?  ?  ?  ?  ?  ?  ?  ?  ?  ?  ?  ?  ?  ?  ?  ?  ?  ?  ? ?  ?  Prior Functioning/Environment    ?  ?  ?  ?   ? ?Frequency ?    ? ? ? ? ?  ?Progress Toward Goals ? ?OT Goals(current goals can now be found in the care plan section) ? Progress towards OT goals: Progressing toward goals ? ?   ?Plan All goals met and education completed, patient discharged from OT services   ? ?Co-evaluation ? ? ?   ?  ?  ?  ?  ? ?  ?AM-PAC OT "6 Clicks" Daily Activity     ?Outcome Measure ? ? Help from another person eating meals?: None ?Help from another person taking care of personal grooming?: None ?Help from another person toileting, which includes using toliet, bedpan, or urinal?: None ?Help from another person bathing (including washing, rinsing, drying)?: None ?Help from another person to put on and taking off regular upper body clothing?: None ?Help from another person to put on and taking off regular lower body clothing?: None ?6 Click Score: 24 ? ?  ?End of Session Equipment Utilized During Treatment: Rollator (4 wheels) ? ?OT Visit  Diagnosis: Unsteadiness on feet (R26.81);Other abnormalities of gait and mobility (R26.89);Muscle weakness (generalized) (M62.81) ?  ?Activity Tolerance Patient tolerated treatment well ?  ?Patient Left in bed;with call bell/phone within reach ?  ?Nurse Communication   ?  ? ?   ? ?Time: 1030-1053 ?OT Time Calculation (min): 23 min ? ?Charges: OT General Charges ?$OT Visit: 1 Visit ?OT Treatments ?$Self Care/Home Management : 8-22 mins ?$Therapeutic Activity: 8-22 mins ? ?Nestor Lewandowsky, OTR/L ?[facility] ?Pager: [phone] ?Office: [phone]  ? ?Malka So ?03/28/2022, 10:59 AM ?

## 2022-03-28 NOTE — Progress Notes (Signed)
? Stanley Stone  TGG:[id number] DOB: Oct 06, 1966 DOA: 03/25/2022 ?PCP: [name], MD   ? ?Brief Narrative:  ?56yo homeless gentleman with a history of poor visual acuity (blind in right eye, blurry vision in left) HTN, DM 2, tobacco abuse, and alcohol abuse who presented to the ER with a 1 week history of right hand weakness.  Since this initial onset it has waxed and waned in an unpredictable fashion ? ?Consultants:  ?Neurology ? ?Goals of Care:  Code Status: Full Code  ? ?DVT prophylaxis: ?Subcutaneous heparin ? ?Interim Hx: ?No acute events reported overnight.  Afebrile.  Vital signs stable. ? ?Assessment & Plan: ? ?Left brain hypoperfusion versus TIA due to left MCA occlusion/large vessel disease - R arm weakness ?-CT head without acute findings ?-MRI brain negative for acute/subacute CVA -mild chronic small vessel ischemic disease noted with cerebral and cerebellar atrophy ?-MRa head notes severe intracranial atherosclerosis with a left MCA occlusion ?-MRa neck negative ?-MRI C-spine notes cervical spine 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 with right foraminal impingement and right ventral cord indentation ?-TTE notes EF 60-65% with normal LV function and no WMA with grade 1 DD and no intracardiac source of embolism appreciable ?-LDL 76 -continue Lipitor without change ?-A1c 8.2 ?Neurology recommending ASA 325 mg plus Plavix for 3 months, and avoidance of hypotension ? ?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 with right foraminal impingement and right ventral cord indentation ?[name] - Neurology recommending outpatient EMGs -symptoms have spontaneously resolved during his hospital stay ? ?Vitamin B12 deficiency ?Vitamin B12 quite low at 228 - supplement with subcutaneous load and oral replacement after discharge ? ?Hypokalemia ?Likely due to poor nutritional status -corrected with supplementation ? ?DM2 ?A1c 8.2 -CBG well  controlled as inpatient ? ?COPD with chronic bronchitis  ?No acute exacerbation ? ?Tobacco use ?Discussed need for absolute tobacco cessation with patient -utilize nicotine patch for now ? ?Homelessness ?Chronically homeless. Pt lives in a tent. ? ?History of alcohol abuse ?Pt has not had any alcohol in 5 days -no evidence of withdrawal during this admission ? ?HTN ?Avoid overcorrection/hypotension in setting of significant intracranial vascular disease ? ?Family Communication: No family present at time of exam ?Disposition: Patient is homeless -will likely be medically ready for discharge within next 24 hours ? ? ?Objective: ?Blood pressure 132/75, pulse 66, temperature 97.6 ?F (36.4 ?C), temperature source Oral, resp. rate 14, height 5\' 5"  (1.651 m), weight 80.9 kg, SpO2 96 %. ? ?Intake/Output Summary (Last 24 hours) at 03/28/2022 0935 ?Last data filed at 03/28/2022 03/30/2022 ?Gross per 24 hour  ?Intake --  ?Output 1800 ml  ?Net -1800 ml  ? ? ?Filed Weights  ? 03/25/22 1129  ?Weight: 80.9 kg  ? ? ?Examination: ?General: No acute respiratory distress ?Lungs: Clear to auscultation bilaterally -no wheezing ?Cardiovascular: Regular rate and rhythm without murmur  ?Abdomen: Nontender, nondistended, soft, bowel sounds positive, no rebound, no ascites, no appreciable mass ?Extremities: No significant cyanosis, clubbing, or edema bilateral lower extremities ?Neuro: Nonfocal on date of discharge with exception to persisting pronator drift right upper extremity and decreased fine dexterity of right hand, which patient states has been his baseline for some time now ? ?CBC: ?Recent Labs  ?Lab 03/25/22 ?1147 03/27/22 ?0104  ?WBC 10.0 8.6  ?NEUTROABS 6.8  --   ?HGB 13.8 13.3  ?HCT 41.3 38.2*  ?MCV 90.6 87.8  ?PLT 254 223  ? ? ?Basic Metabolic Panel: ?Recent  Labs  ?Lab 03/25/22 ?1147 03/27/22 ?0104 03/28/22 ?0121  ?NA 139 137 140  ?K 3.8 3.1* 3.8  ?CL 100 102 105  ?CO2 30 27 28   ?GLUCOSE 226* 136* 150*  ?BUN 24* 19 22*  ?CREATININE  1.26* 1.30* 1.52*  ?CALCIUM 9.5 8.9 9.0  ?MG  --  2.2 2.2  ? ? ?GFR: ?Estimated Creatinine Clearance: 53.2 mL/min (A) (by C-G formula based on SCr of 1.52 mg/dL (H)). ? ?Liver Function Tests: ?Recent Labs  ?Lab 03/25/22 ?1147 03/27/22 ?0104  ?AST 20 17  ?ALT 26 26  ?ALKPHOS 83 71  ?BILITOT 0.9 0.4  ?PROT 7.3 6.3*  ?ALBUMIN 4.0 3.4*  ? ?HbA1C: ?HbA1c, POC (controlled diabetic range)  ?Date/Time Value Ref Range Status  ?01/18/2022 10:56 AM 9.1 (A) 0.0 - 7.0 % Final  ? ?Hgb A1c MFr Bld  ?Date/Time Value Ref Range Status  ?03/26/2022 10:43 AM 8.2 (H) 4.8 - 5.6 % Final  ?  Comment:  ?  (NOTE) ?Pre diabetes:          5.7%-6.4% ? ?Diabetes:              >6.4% ? ?Glycemic control for   <7.0% ?adults with diabetes ?  ?02/07/2019 04:21 PM 7.8 (H) 4.8 - 5.6 % Final  ?  Comment:  ?           Prediabetes: 5.7 - 6.4 ?         Diabetes: >6.4 ?         Glycemic control for adults with diabetes: <7.0 ?  ? ? ?Scheduled Meds: ? amLODipine  10 mg Oral Daily  ? aspirin  325 mg Oral Daily  ? atorvastatin  40 mg Oral Daily  ? clopidogrel  75 mg Oral Daily  ? cyanocobalamin  1,000 mcg Subcutaneous Daily  ? heparin  5,000 Units Subcutaneous Q8H  ? insulin aspart  0-15 Units Subcutaneous TID WC  ? insulin aspart  0-5 Units Subcutaneous QHS  ? insulin glargine-yfgn  10 Units Subcutaneous Daily  ? mometasone-formoterol  2 puff Inhalation BID  ? nicotine  21 mg Transdermal Daily  ? pantoprazole  40 mg Oral Daily  ? thiamine injection  100 mg Intravenous Once  ? thiamine  100 mg Oral Daily  ? ? ? LOS: 0 days  ? ?02/09/2019, MD ?[facility] ?Office  [phone] ?Pager - Text Page per [phone] ? ?If 7PM-7AM, please contact night-coverage per Amion ?03/28/2022, 9:35 AM ? ? ? ? ?

## 2022-03-28 NOTE — Discharge Summary (Addendum)
?DISCHARGE SUMMARY ? ?Stanley Stone ? ?MR#: [id number] ? ?DOB:05/28/1966  ?Date of Admission: 03/25/2022 ?Date of Discharge: 03/28/2022 ? ?Attending Physician:Jeffrey T McClung, MD ? ?Patient's PCP:Wright, Patrick E, MD ? ?Consults: Neurology ? ?Disposition: Discharge home ? ?Follow-up Appts: ? Follow-up Information   ? ? Wright, Patrick E, MD Follow up.   ?Specialty: Pulmonary Disease ?Contact information: ?[address] ?[address] ?[place] [address] ?[phone] ? ? ?  ?  ? ? [facility]. Schedule an appointment as soon as possible for a visit in 1 month(s).   ?Specialty: Neurology ?Contact information: ?[address] ?[place] [address] ?[phone] ? ?  ?  ? ?  ?  ? ?  ? ? ?Tests Needing Follow-up: ?-needs outpatient NCS/EMG as recommended by stroke team - this should be arranged through outpatient follow-up with neurology ?-avoid overcorrection of BP due to CVD and the need to assure adequate brain perfusion  ? -B12 should be rechecked in 8-12 weeks to assure he is able to absorb it orally  ? ?Discharge Diagnoses: ?Left brain hypoperfusion versus TIA ?Left MCA occlusion/large vessel disease ?R arm weakness ?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 with right foraminal impingement and right ventral cord indentation ?Vitamin B12 deficiency ?Hypokalemia ?DM2 ?COPD with chronic bronchitis  ?Tobacco use ?Homelessness ?History of alcohol abuse ?HTN ? ?Initial presentation: ?55yo homeless gentleman with a history of poor visual acuity (blind in right eye, blurry vision in left) HTN, DM 2, tobacco abuse, and alcohol abuse who presented to the ER with a 1 week history of right hand weakness.  Since the initial onset it has waxed and waned in an unpredictable fashion. ? ?Hospital Course: ? ?Left brain hypoperfusion versus TIA due to left MCA occlusion/large vessel disease - R arm weakness ?-CT head without acute findings ?-MRI brain negative for acute/subacute  CVA -mild chronic small vessel ischemic disease noted with cerebral and cerebellar atrophy ?-MRa head notes severe intracranial atherosclerosis with a left MCA occlusion ?-MRa neck negative ?-MRI C-spine notes cervical spine 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 with right foraminal impingement and right ventral cord indentation ?-TTE notes EF 60-65% with normal LV function and no WMA with grade 1 DD and no intracardiac source of embolism appreciable ?-LDL 76 -continue Lipitor without change ?-A1c 8.2 ?Neurology recommending ASA 325 mg plus Plavix for 3 months then ASA alone, and avoidance of hypotension ?  ?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 with right foraminal impingement and right ventral cord indentation ?[name] - Neurology recommending outpatient EMGs/NCS -symptoms have spontaneously resolved during his hospital stay ?  ?Vitamin B12 deficiency ?Vitamin B12 quite low at 228 - supplemented with subcutaneous load while inpatient, and oral replacement after discharge - B12 should be rechecked in 8-12 weeks to assure he is able to absorb it orally  ?  ?Hypokalemia ?Likely due to poor nutritional status -corrected with supplementation ?  ?DM2 ?A1c 8.2 -CBG well controlled as inpatient ?  ?COPD with chronic bronchitis  ?No acute exacerbation ?  ?Tobacco use ?Discussed need for absolute tobacco cessation with patient -utilized nicotine patch to assist w/ cessation  ?  ?Homelessness ?Chronically homeless. Pt lives in a tent. TOC/CSW assisted with maximizing outpatient f/u and resources. All new meds provided to patient at bedside from [facility] prior to d/c home.  ?  ?History of alcohol abuse ?Pt has not had any alcohol in 5 days -no evidence   of withdrawal during this admission - encouraged continued abstinence  ?  ?HTN ?Avoid overcorrection/hypotension in setting of significant intracranial vascular disease ?  ? ?Allergies as of  03/28/2022   ? ?   Reactions  ? Penicillins   ? Swell Up   ? ?  ? ?  ?Medication List  ?  ? ?STOP taking these medications   ? ?hydrOXYzine 10 MG tablet ?Commonly known as: ATARAX ?  ? ?  ? ?TAKE these medications   ? ?Advair HFA 115-21 MCG/ACT inhaler ?Generic drug: fluticasone-salmeterol ?Inhale 2 puffs into the lungs 2 (two) times daily. ?Notes to patient: Did not receive in hospital; resume as directed. ?  ?albuterol 108 (90 Base) MCG/ACT inhaler ?Commonly known as: VENTOLIN HFA ?Inhale 2 puffs into the lungs every 6 (six) hours as needed for wheezing or shortness of breath. ?Notes to patient: Did not receive in hospital; resume as directed. ?  ?amLODipine 10 MG tablet ?Commonly known as: NORVASC ?Take 1 tablet (10 mg total) by mouth daily. ?  ?aspirin 325 MG tablet ?Take 1 tablet (325 mg total) by mouth daily. ?Start taking on: Mar 29, 2022 ?  ?atorvastatin 40 MG tablet ?Commonly known as: LIPITOR ?Take 1 tablet (40 mg total) by mouth daily. ?  ?Basaglar KwikPen 100 UNIT/ML ?Inject 10 Units into the skin daily. ?  ?carvedilol 25 MG tablet ?Commonly known as: COREG ?Take 1 tablet (25 mg total) by mouth 2 (two) times daily with a meal. ?Notes to patient: Did not receive in hospital; resume as directed. ?  ?clopidogrel 75 MG tablet ?Commonly known as: PLAVIX ?Take 1 tablet (75 mg total) by mouth daily. ?Start taking on: Mar 29, 2022 ?  ?lisinopril-hydrochlorothiazide 20-25 MG tablet ?Commonly known as: ZESTORETIC ?Take 1 tablet by mouth daily. ?Notes to patient: Did not receive in hospital; resume as directed. ?  ?metFORMIN 500 MG tablet ?Commonly known as: GLUCOPHAGE ?Take 1 tablet (500 mg total) by mouth 2 (two) times daily with a meal. ?Notes to patient: Did not receive in hospital; resume as directed. ?  ?nicotine 21 mg/24hr patch ?Commonly known as: NICODERM CQ - dosed in mg/24 hours ?Place 1 patch (21 mg total) onto the skin daily. ?Start taking on: Mar 29, 2022 ?  ?pantoprazole 40 MG tablet ?Commonly known  as: PROTONIX ?Take 1 tablet (40 mg total) by mouth daily. ?  ?thiamine 100 MG tablet ?Take 1 tablet (100 mg total) by mouth daily. ?Start taking on: Mar 29, 2022 ?  ?traMADol 50 MG tablet ?Commonly known as: ULTRAM ?Take 1 tablet (50 mg total) by mouth every 6 (six) hours as needed for moderate pain. ?  ?True Metrix Blood Glucose Test test strip ?Generic drug: glucose blood ?Use as instructed ?  ?True Metrix Meter w/Device Kit ?Use as directed daily to check blood sugar ?  ?TRUEplus Lancets 28G Misc ?Check blood sugar daily ?  ?Unifine Pentips 31G X 8 MM Misc ?Generic drug: Insulin Pen Needle ?Use with insulin pen ?  ?vitamin B-12 1000 MCG tablet ?Commonly known as: CYANOCOBALAMIN ?Take 1 tablet (1,000 mcg total) by mouth daily. ?  ? ?  ? ?  ?  ? ? ?  ?Durable Medical Equipment  ?(From admission, onward)  ?  ? ? ?  ? ?  Start     Ordered  ? 03/27/22 1521  For home use only DME 4 wheeled rolling walker with seat  Once       ?Question:  Patient needs a walker to   treat with the following condition  Answer:  Weakness  ? 03/27/22 1520  ? ?  ?  ? ?  ? ? ?Day of Discharge ?BP 129/85 (BP Location: Right Arm)   Pulse 77   Temp 98.1 ?F (36.7 ?C) (Oral)   Resp 19   Ht 5' 5" (1.651 m)   Wt 80.9 kg   SpO2 97%   BMI 29.69 kg/m?  ? ?Physical Exam: ?General: No acute respiratory distress ?Lungs: Clear to auscultation bilaterally without wheezes or crackles ?Cardiovascular: Regular rate and rhythm without murmur gallop or rub normal S1 and S2 ?Abdomen: Nontender, nondistended, soft, bowel sounds positive, no rebound, no ascites, no appreciable mass ?Extremities: No significant cyanosis, clubbing, or edema bilateral lower extremities ?Neuro: Nonfocal on date of discharge with exception to mild right upper extremity pronator drift which has been persistent throughout hospital stay, and decreased fine motor movement of right hand, which patient states has been present for "a long time" ? ?Basic Metabolic Panel: ?Recent Labs   ?Lab 03/25/22 ?1147 03/27/22 ?0104 03/28/22 ?0121  ?NA 139 137 140  ?K 3.8 3.1* 3.8  ?CL 100 102 105  ?CO2 _0 ?GLUCOSE 226* 136* 150*  ?BUN 24* 19 22*  ?CREATININE 1.26* 1.30* 1.52*  ?CALCIUM 9.5 8.9 9.0  ?MG

## 2022-03-28 NOTE — Progress Notes (Signed)
Patient being discharged home per MD orders. AVS teaching given to patient. Written prescription and taxi voucher given to patient. Denies any further needs. Patient refused to wait for wheelchair escort to main entrance. Patient ambulated off unit with rollator and belongings in stable condition.  ?

## 2022-03-29 ENCOUNTER — Encounter: Payer: Self-pay | Admitting: Pediatric Intensive Care

## 2022-03-29 ENCOUNTER — Telehealth: Payer: Self-pay

## 2022-03-29 LAB — VITAMIN B6: Vitamin B6: 3.5 ug/L (ref 3.4–65.2)

## 2022-03-29 NOTE — Congregational Nurse Program (Signed)
?  Dept: [phone] ? ? ?Congregational Nurse Program Note ? ?Date of Encounter: 03/29/2022 ? ?Past Medical History: ?Past Medical History:  ?Diagnosis Date  ? Closed fracture dislocation of lumbar spine (HCC) 1987  ? COPD (chronic obstructive pulmonary disease) (HCC)   ? Diabetes mellitus without complication (HCC)   ? GERD (gastroesophageal reflux disease)   ? High cholesterol   ? Hypertension   ? ? ?Encounter Details: ?Call to client- ID verified x2. Client states that he has been discharged from the hospital and is on his way back to [facility]. CCN advised that she spoke with [facility] staff and they may have space for him at the shelter tomorrow. He states that he could go back there or to [facility], clinical in [place]. CCN advised that he may be served more effectively in [place] due to availability of medical care. CCN will call client back tomorrow to update on status of space at shelter. ?Shann Medal BSN RN CCNP ?[phone] ? ? ? ?

## 2022-03-29 NOTE — Congregational Nurse Program (Signed)
?  Dept: [phone] ? ? ?Congregational Nurse Program Note ? ?Date of Encounter: 03/29/2022 ? ?Past Medical History: ?Past Medical History:  ?Diagnosis Date  ? Closed fracture dislocation of lumbar spine (HCC) 1987  ? COPD (chronic obstructive pulmonary disease) (HCC)   ? Diabetes mellitus without complication (HCC)   ? GERD (gastroesophageal reflux disease)   ? High cholesterol   ? Hypertension   ? ? ?Encounter Details: Call to client- IDx2. CCN advised client that [facility] has space for him and that CCN could provide immediate transportation. Client states that he needs to think about this and will call back. ?Shann Medal BSN RN CCNP ?[phone]. ? ? ? ? ?

## 2022-03-29 NOTE — Congregational Nurse Program (Signed)
?  Dept: [phone] ? ? ?Congregational Nurse Program Note ? ?Date of Encounter: 03/29/2022 ? ?Past Medical History: ?Past Medical History:  ?Diagnosis Date  ? Closed fracture dislocation of lumbar spine (HCC) 1987  ? COPD (chronic obstructive pulmonary disease) (HCC)   ? Diabetes mellitus without complication (HCC)   ? GERD (gastroesophageal reflux disease)   ? High cholesterol   ? Hypertension   ? ? ?Encounter Details: Client called. He is on his way to do intake at [facility]. ?Shann Medal BSN RN CCNP ?[phone] ? ? ? ? ?

## 2022-03-29 NOTE — Telephone Encounter (Signed)
Transition Care Management Unsuccessful Follow-up Telephone Call ? ?Date of discharge and from where:  03/28/2022, [facility] ? ?Attempts:  1st Attempt ? ?Reason for unsuccessful TCM follow-up call:  Unable to reach patient # [phone].  The person who answered said there is no [name] there.   ? ?Message sent to [facility], RN/CNP to inquire if she has another phone number for patient. She has bee in contact with him today regarding bed availability at [facility].  ? ? ? ?

## 2022-03-30 ENCOUNTER — Telehealth: Payer: Self-pay

## 2022-03-30 ENCOUNTER — Encounter: Payer: Self-pay | Admitting: Critical Care Medicine

## 2022-03-30 ENCOUNTER — Other Ambulatory Visit (HOSPITAL_COMMUNITY): Payer: Self-pay

## 2022-03-30 ENCOUNTER — Other Ambulatory Visit: Payer: Self-pay | Admitting: Critical Care Medicine

## 2022-03-30 DIAGNOSIS — I63512 Cerebral infarction due to unspecified occlusion or stenosis of left middle cerebral artery: Secondary | ICD-10-CM

## 2022-03-30 MED ORDER — BASAGLAR KWIKPEN 100 UNIT/ML ~~LOC~~ SOPN
10.0000 [IU] | PEN_INJECTOR | Freq: Every day | SUBCUTANEOUS | 2 refills | Status: DC
  Filled 2022-03-30 – 2022-03-31 (×2): qty 3, 30d supply, fill #0
  Filled 2022-03-31: qty 3, 28d supply, fill #0

## 2022-03-30 NOTE — Telephone Encounter (Signed)
Transition Care Management Unsuccessful Follow-up Telephone Call ? ?Date of discharge and from where:  03/28/2022, [facility]  ? ?Attempts:  2nd Attempt -new phone number ? ?Reason for unsuccessful TCM follow-up call:  Unable to leave message - voicemail not set up on # [phone]. This number was obtained from [place] RN /CNP who also confirmed that the patient is now staying at [facility] ? ? ? ?

## 2022-03-30 NOTE — Telephone Encounter (Signed)
Transition Care Management Unsuccessful Follow-up Telephone Call ? ?Date of discharge and from where:  03/28/2022, [facility]  ? ?Attempts:  2nd Attempt ? ?Reason for unsuccessful TCM follow-up call:  Unable to reach patient- Unable to reach patient # [phone].  The person who answered said there is no Giorgi that lives there.  Wrong number, and this phone number was removed from Epic.  ?  ?Yesterday I sent a message to [place] RN/CNP to inquire if she has another phone number for patient. She had  bee in contact with him regarding bed availability at the [facility].  ?Today, I sent a message also sent to Marin Roberts, RN/ [facility] to inquire if she has been in contact with the patient.  ? ? ? ?

## 2022-03-31 ENCOUNTER — Other Ambulatory Visit: Payer: Self-pay | Admitting: Critical Care Medicine

## 2022-03-31 ENCOUNTER — Encounter: Payer: Self-pay | Admitting: *Deleted

## 2022-03-31 ENCOUNTER — Other Ambulatory Visit: Payer: Self-pay

## 2022-03-31 ENCOUNTER — Other Ambulatory Visit (HOSPITAL_COMMUNITY): Payer: Self-pay

## 2022-03-31 ENCOUNTER — Telehealth: Payer: Self-pay

## 2022-03-31 DIAGNOSIS — I639 Cerebral infarction, unspecified: Secondary | ICD-10-CM

## 2022-03-31 DIAGNOSIS — I6930 Unspecified sequelae of cerebral infarction: Secondary | ICD-10-CM

## 2022-03-31 LAB — VITAMIN B1: Vitamin B1 (Thiamine): 214.2 nmol/L — ABNORMAL HIGH (ref 66.5–200.0)

## 2022-03-31 MED ORDER — POLYETHYLENE GLYCOL 3350 17 GM/SCOOP PO POWD
17.0000 g | Freq: Two times a day (BID) | ORAL | 1 refills | Status: DC | PRN
  Filled 2022-03-31: qty 510, 25d supply, fill #0

## 2022-03-31 NOTE — Congregational Nurse Program (Signed)
  Dept: [phone]   Congregational Nurse Program Note  Date of Encounter: 03/31/2022  Past Medical History: Past Medical History:  Diagnosis Date   Closed fracture dislocation of lumbar spine ([place]) 1987   COPD (chronic obstructive pulmonary disease) ([place])    Diabetes mellitus without complication (HCC)    GERD (gastroesophageal reflux disease)    High cholesterol    Hypertension     Encounter Details:  CNP Questionnaire - 03/25/22 1015       Questionnaire   Do you give verbal consent to treat you today? Yes    Location Patient Served  Not Applicable   Met patient at [employer]   Visit Setting Other    Patient Status Homeless    Insurance Unknown    Insurance Referral N/A    Medication Have Medication Insecurities    Medical Provider Yes    Screening Referrals N/A    Medical Appointment Made N/A    Food Have Food Insecurities    Transportation Need transportation assistance    Housing/Utilities No permanent housing    Interpersonal Safety N/A    Intervention Support    ED Visit Averted --   will report sxs to [facility] glucose monitored supplies to patient. Patient alert, oriented x3 , and able to speaks well. When I asked Stanley Stone about the numbness in his arms he stated " you know now when I am having these spells I am having trouble speaking too. I forgot to tell [place] that." Patient denied any shortness of breath, jaw pain, trouble with vision. Called Lisette Abu, RN immediately and she arranged transportation for patient to the [place] ED after talking with Dr Joya Gaskins.  Morrie Sheldon, RN BSN [place] [facility] [phone]

## 2022-03-31 NOTE — Progress Notes (Unsigned)
This is a transition of care visit which occurred in the [facility].  This is a 56 year old male primary care patient of mine who is seen post hospital for recent left MCA stroke.  He was just discharged this week.  Below is a copy of the discharge summary  Date of Admission: 03/25/2022 Date of Discharge: 03/28/2022   Attending Physician:Jeffrey Hennie Duos, MD   Patient's OFB:PZWCHE, [name], MD   Consults: Neurology   Disposition: Discharge home   Follow-up Appts:   Follow-up Information       [name], MD Follow up.   Specialty: Pulmonary Disease Contact information: [address] [place] [address] [phone]              [facility]. Schedule an appointment as soon as possible for a visit in 1 month(s).   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  Since the initial onset it has waxed and waned in an  unpredictable fashion.   Hospital Course:   Left brain hypoperfusion versus TIA due to left MCA occlusion/large vessel disease - R arm weakness -CT head without acute findings -MRI brain negative for acute/subacute CVA -mild chronic small vessel ischemic disease noted with cerebral and cerebellar atrophy -MRa head notes severe intracranial atherosclerosis with a left MCA occlusion -MRa neck negative -MRI C-spine notes cervical spine 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 with right foraminal impingement and right ventral cord indentation -TTE notes EF 60-65% with normal LV function and no WMA with grade 1 DD and no intracardiac source of embolism appreciable -LDL 76 -continue Lipitor without change -A1c 8.2 Neurology recommending ASA 325 mg plus Plavix for 3 months then ASA alone, and avoidance of hypotension   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 with right foraminal impingement and right ventral cord indentation [name] - Neurology recommending outpatient EMGs/NCS -symptoms have spontaneously resolved during his hospital stay   Vitamin B12 deficiency Vitamin B12 quite low at 228 - supplemented with subcutaneous load while inpatient, and oral replacement after discharge - B12 should be rechecked in 8-12 weeks to assure he is able to absorb it orally    Hypokalemia Likely due to poor nutritional status -corrected with supplementation   DM2 A1c 8.2 -CBG well controlled as inpatient   COPD with chronic bronchitis  No acute exacerbation   Tobacco use Discussed  need for absolute tobacco cessation with patient -utilized nicotine patch to assist w/ cessation    Homelessness Chronically homeless. Pt lives in a tent. TOC/CSW assisted with maximizing outpatient f/u and resources. All new meds provided to patient at bedside from [facility] prior to d/c home.    History of alcohol abuse Pt has not had any alcohol  in 5 days -no evidence of withdrawal during this admission - encouraged continued abstinence    HTN Avoid overcorrection/hypotension in setting of significant intracranial vascular disease      This patient had been living in a tent in [facility] he now is in the [place] thanks to [employer].  The patient was discharged with out his insulin and also without any aspirin.  He did receive 3 transition of care pharmacy all of his other medications.  Med list is noted below  Outpatient Encounter Medications as of 03/30/2022  Medication Sig   albuterol (VENTOLIN HFA) 108 (90 Base) MCG/ACT inhaler Inhale 2 puffs into the lungs every 6 (six) hours as needed for wheezing or shortness of breath.   amLODipine (NORVASC) 10 MG tablet Take 1 tablet (10 mg total) by mouth daily.   aspirin 325 MG tablet Take 1 tablet (325 mg total) by mouth daily.   atorvastatin (LIPITOR) 40 MG tablet Take 1 tablet (40 mg total) by mouth daily.   Blood Glucose Monitoring Suppl (TRUE METRIX METER) w/Device KIT Use as directed daily to check blood sugar   carvedilol (COREG) 25 MG tablet Take 1 tablet (25 mg total) by mouth 2 (two) times daily with a meal.   clopidogrel (PLAVIX) 75 MG tablet Take 1 tablet (75 mg total) by mouth daily.   fluticasone-salmeterol (ADVAIR HFA) 115-21 MCG/ACT inhaler Inhale 2 puffs into the lungs 2 (two) times daily.   glucose blood (TRUE METRIX BLOOD GLUCOSE TEST) test strip Use as instructed   Insulin Glargine (BASAGLAR KWIKPEN) 100 UNIT/ML Inject 10 Units into the skin daily.   Insulin Pen Needle (UNIFINE PENTIPS) 31G X 8 MM MISC Use with insulin pen   lisinopril-hydrochlorothiazide (ZESTORETIC) 20-25 MG tablet Take 1 tablet by mouth daily.   metFORMIN (GLUCOPHAGE) 500 MG tablet Take 1 tablet (500 mg total) by mouth 2 (two) times daily with a meal. (Patient not taking: Reported on 03/25/2022)   nicotine (NICODERM CQ - DOSED IN MG/24 HOURS) 21 mg/24hr patch Place 1 patch (21 mg  total) onto the skin daily.   pantoprazole (PROTONIX) 40 MG tablet Take 1 tablet (40 mg total) by mouth daily.   thiamine 100 MG tablet Take 1 tablet (100 mg total) by mouth daily.   traMADol (ULTRAM) 50 MG tablet Take 1 tablet (50 mg total) by mouth every 6 (six) hours as needed for moderate pain.   TRUEplus Lancets 28G MISC Check blood sugar daily   vitamin B-12 (CYANOCOBALAMIN) 1000 MCG tablet Take 1 tablet (1,000 mcg total) by mouth daily.   No facility-administered encounter medications on file as of 03/30/2022.    Patient is still struggling with partial aphasia and left-sided weakness.  He was not established with any physical therapy or speech therapy post hospital as he is uninsured.  On exam blood pressure is elevated 165/81 saturation 97% pulse 86 patient demonstrates aphasia he has right-sided upper extremity weakness Chest is clear Cardiac exam unremarkable  Impression is that of recent MCA stroke you will need access to his insulin products he will need post hospital follow-up in my clinic as soon as possible  we will establish this  We did give him samples of 325 mg aspirin all his other medications are accessible and have been reconciled  We will endeavor to see if we can get this patient physical therapy and speech therapy through some type of grant program as he is uninsured  Asencion Noble.

## 2022-03-31 NOTE — Congregational Nurse Program (Signed)
Pt ask RN to help with his constipation, states no BM in appr 8 days, states he is uncomfortable and has tried everything available to him= apple juice Sent message to dr Delford Field, will pick up script at pharmacy today after dr Delford Field responded.

## 2022-03-31 NOTE — Telephone Encounter (Signed)
Transition Care Management Follow-up Telephone Call Date of discharge and from where: 03/28/2022, [facility].  He was seen by Dr Joya Gaskins yesterday- 03/30/2022 at the [facility] and has an appointment with Dr Joya Gaskins at [facility] - 04/05/2022. Freddy Finner, RN at [facility] notified of the appointment. Patient will need transportation scheduled.

## 2022-04-04 ENCOUNTER — Other Ambulatory Visit: Payer: Self-pay

## 2022-04-04 ENCOUNTER — Ambulatory Visit: Payer: Self-pay | Attending: Critical Care Medicine | Admitting: Speech Pathology

## 2022-04-04 NOTE — Progress Notes (Unsigned)
New Patient Office Visit  Subjective:  Patient ID: Stanley Stone, male    DOB: Oct 25, 1966  Age: 56 y.o. MRN: [id number]  CC:  Stanley Stone visit medication refills  HPI 01/18/22 [name] presents for to establish care.  I have not seen this patient since March 2020.  This patient is homeless he lives in an abandoned house in [place].  There is no electricity or heat.  He is sleeping on the floor.  Patient has severe hypertension and type 2 diabetes.  On arrival blood pressure 180/105.  He was given prescription last week for Zestoretic along with metformin and atorvastatin.  Patient's been having significant dizziness emesis epigastric abdominal pain some blood intermixed in the emesis.  He drinks about 1 beer a day.  If he stops drinking beer he feels he might go into delirium tremens.  He has a history of COPD and does have a chronic cough with some wheezing.  On arrival A1c is 9.1 blood sugar is 232.  He does not have a meter currently to measure his blood sugar.  He eats a lot of carbohydrates because he gets food from charts of food pantry's that have largely carbohydrate-based meals.  Patient does note shortness of breath with exertion.  He does not have any rectal bleeding.  He notes some numbness in the hands and in the feet.  04/05/22 This patient was seen last week at the [facility] today's visit is a posthospital visit for medication refills. Below is documentation from the last visit Seen 5/17 at [facility]: This is a transition of care visit which occurred in the [place].  This is a 56 year old male primary care patient of mine who is seen post Stone for recent left MCA stroke.  He was just discharged this week.  Below is a copy of the discharge summary   Date of Admission: 03/25/2022 Date of Discharge: 03/28/2022   Attending Physician:Jeffrey Hennie Duos, MD   Patient's ZGY:FVCBSW, Stone Harry, MD   Consults: Neurology   Disposition:  Discharge home   Follow-up Appts:   Follow-up Information       Stanley Stain, MD Follow up.   Specialty: Pulmonary Disease Contact information: [address] Stanley Stone [address] [phone]              [facility]. Schedule an appointment as soon as possible for a visit in 1 month(s).   Specialty: Neurology Contact information: [address] 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  Since the initial onset it has waxed and waned in an unpredictable fashion.   Stone Course:   Left brain hypoperfusion versus TIA due to left MCA occlusion/large vessel disease - R arm weakness -CT head without acute findings -MRI brain negative for acute/subacute CVA -mild chronic small vessel ischemic disease noted with cerebral and cerebellar atrophy -MRa head notes severe intracranial atherosclerosis with a left MCA occlusion -MRa neck negative -MRI C-spine notes cervical spine degeneration  most notable at C4-5 with right foraminal impingement and right ventral cord indentation -TTE notes EF 60-65% with normal LV function and no WMA with grade 1 DD and no intracardiac source of embolism appreciable -LDL 76 -continue Lipitor without change -A1c 8.2 Neurology recommending ASA 325 mg plus Plavix for 3 months then ASA alone, and avoidance of hypotension   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 with right foraminal impingement and right ventral cord indentation [name] - Neurology recommending outpatient EMGs/NCS -symptoms have spontaneously resolved during his Stone stay   Vitamin B12 deficiency Vitamin B12 quite low at 228 - supplemented with subcutaneous load while inpatient, and oral replacement after discharge - B12 should be rechecked in 8-12 weeks to assure he is able to absorb it orally    Hypokalemia Likely due to poor nutritional status -corrected with supplementation   DM2 A1c 8.2 -CBG well controlled as inpatient   COPD with chronic bronchitis  No acute exacerbation   Tobacco use Discussed need for absolute tobacco cessation with patient -utilized nicotine patch to assist w/ cessation    Homelessness Chronically homeless. Pt lives in a tent. TOC/CSW assisted with maximizing outpatient f/u and resources. All new meds provided to patient at bedside from [facility] prior to d/c home.    History of alcohol abuse Pt has not had any alcohol in 5 days -no evidence of withdrawal during this admission - encouraged continued abstinence    HTN Avoid overcorrection/hypotension in setting of significant intracranial vascular disease       This patient had been living in a tent in [facility] he now is in the [place] thanks to [employer] [employer].  The patient was discharged with out his insulin and also without any aspirin.  He did receive 3 transition of care pharmacy all of his  other medications.   Med list is noted below       Outpatient Encounter Medications as of 03/30/2022  Medication Sig   albuterol (VENTOLIN HFA) 108 (90 Base) MCG/ACT inhaler Inhale 2 puffs into the lungs every 6 (six) hours as needed for wheezing or shortness of breath.   amLODipine (NORVASC) 10 MG tablet Take 1 tablet (10 mg total) by mouth daily.   aspirin 325 MG tablet Take 1 tablet (325 mg total) by mouth daily.   atorvastatin (LIPITOR) 40 MG tablet Take 1 tablet (40 mg total) by mouth daily.   Blood Glucose Monitoring Suppl (TRUE METRIX METER) w/Device KIT Use as directed daily to check blood sugar   carvedilol (COREG) 25 MG tablet Take 1 tablet (25 mg total) by mouth 2 (two) times daily with a meal.   clopidogrel (PLAVIX) 75 MG tablet Take 1 tablet (75 mg total) by mouth daily.   fluticasone-salmeterol (ADVAIR HFA) 115-21 MCG/ACT inhaler Inhale 2 puffs into the lungs 2 (two) times daily.   glucose blood (TRUE METRIX BLOOD GLUCOSE TEST) test strip  Use as instructed   Insulin Glargine (BASAGLAR KWIKPEN) 100 UNIT/ML Inject 10 Units into the skin daily.   Insulin Pen Needle (UNIFINE PENTIPS) 31G X 8 MM MISC Use with insulin pen   lisinopril-hydrochlorothiazide (ZESTORETIC) 20-25 MG tablet Take 1 tablet by mouth daily.   metFORMIN (GLUCOPHAGE) 500 MG tablet Take 1 tablet (500 mg total) by mouth 2 (two) times daily with a meal. (Patient not taking: Reported on 03/25/2022)   nicotine (NICODERM CQ - DOSED IN MG/24 HOURS) 21 mg/24hr patch Place 1 patch (21 mg total) onto the skin daily.   pantoprazole (PROTONIX) 40 MG tablet Take 1 tablet (40 mg total) by mouth daily.   thiamine 100 MG tablet Take 1 tablet (100 mg total) by mouth daily.   traMADol (ULTRAM) 50 MG tablet Take 1 tablet (50 mg total) by mouth every 6 (six) hours as needed for moderate pain.   TRUEplus Lancets 28G MISC Check blood sugar daily   vitamin B-12 (CYANOCOBALAMIN) 1000 MCG tablet Take 1 tablet (1,000 mcg total) by mouth  daily.    No facility-administered encounter medications on file as of 03/30/2022.    Patient is still struggling with partial aphasia and left-sided weakness.  He was not established with any physical therapy or speech therapy post Stone as he is uninsured.   On exam blood pressure is elevated 165/81 saturation 97% pulse 86 patient demonstrates aphasia he has right-sided upper extremity weakness Chest is clear Cardiac exam unremarkable   Impression is that of recent MCA stroke you will need access to his insulin products he will need post Stone follow-up in my clinic as soon as possible we will establish this  We did give him samples of 325 mg aspirin all his other medications are accessible and have been reconciled  We will endeavor to see if we can get this patient physical therapy and speech therapy through some type of grant program as he is uninsured    Today in the clinic the patient has difficulty with memory still has trouble with ambulation using a rollator has a follow-up appointment June 6 at rehab for physical therapy and speech-language pathology.  On arrival blood pressure is elevated 150/92.  The patient has not been taking his medications recently.  He is also been skipping his metformin.  Blood sugar today is elevated.  He is only taking 10 units of Lantus insulin daily.  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  He is smoking 5 cigarettes daily at this time.  He is only on Zestoretic 20/25 daily.   Past Medical History:  Diagnosis Date   Closed fracture dislocation of lumbar spine ([place]) 1987   COPD (chronic obstructive pulmonary disease) ([place])    Diabetes mellitus without complication (HCC)    GERD (gastroesophageal reflux disease)    High cholesterol    Hypertension     History reviewed. No pertinent surgical history.  History reviewed. No pertinent family history.  Social History   Socioeconomic History   Marital status: Divorced     Spouse name: Not on file   Number of children: Not on file   Years of education: Not on file   Highest education level: Not on file  Occupational History   Not on file  Tobacco Use   Smoking status: Every Day    Packs/day: 0.50    Types: Cigarettes   Smokeless tobacco: Never  Vaping Use   Vaping Use: Never used  Substance and Sexual Activity   Alcohol  use: Yes   Drug use: Not Currently   Sexual activity: Not Currently  Other Topics Concern   Not on file  Social History Narrative   Not on file   Social Determinants of Health   Financial Resource Strain: Not on file  Food Insecurity: Not on file  Transportation Needs: Not on file  Physical Activity: Not on file  Stress: Not on file  Social Connections: Not on file  Intimate Partner Violence: Not on file    ROS Review of Systems  Constitutional:  Positive for fatigue. Negative for fever.  HENT:  Negative for congestion, drooling, ear pain, facial swelling, postnasal drip, rhinorrhea, sinus pressure, sneezing, sore throat, tinnitus, trouble swallowing and voice change.   Eyes:  Negative for visual disturbance.  Respiratory:  Positive for wheezing. Negative for apnea, cough, choking, chest tightness, shortness of breath and stridor.   Cardiovascular: Negative.  Negative for chest pain, palpitations and leg swelling.  Gastrointestinal:  Negative for abdominal distention, abdominal pain, anal bleeding, blood in stool, constipation, diarrhea, nausea, rectal pain and vomiting.  Endocrine: Negative for polydipsia, polyphagia and polyuria.  Genitourinary: Negative.   Musculoskeletal:  Positive for gait problem. Negative for arthralgias and myalgias.       Hip pain  Skin: Negative.  Negative for rash.  Allergic/Immunologic: Negative.  Negative for environmental allergies and food allergies.  Neurological:  Positive for speech difficulty and weakness. Negative for dizziness, syncope, light-headedness and headaches.  Hematological:  Negative.  Negative for adenopathy. Does not bruise/bleed easily.  Psychiatric/Behavioral: Negative.  Negative for agitation, decreased concentration, dysphoric mood, hallucinations, self-injury, sleep disturbance and suicidal ideas. The patient is not nervous/anxious and is not hyperactive.    Objective:   Today's Vitals: BP (!) 150/92   Pulse 76   Wt 172 lb (78 kg)   SpO2 97%   BMI 28.62 kg/m   Physical Exam Vitals reviewed.  Constitutional:      Appearance: Normal appearance. He is well-developed. He is obese. He is not diaphoretic.  HENT:     Head: Normocephalic and atraumatic.     Nose: Nose normal. No nasal deformity, septal deviation, mucosal edema or rhinorrhea.     Right Sinus: No maxillary sinus tenderness or frontal sinus tenderness.     Left Sinus: No maxillary sinus tenderness or frontal sinus tenderness.     Mouth/Throat:     Mouth: Mucous membranes are moist.     Pharynx: Oropharynx is clear. No oropharyngeal exudate.     Comments: Poor dentition Eyes:     General: No scleral icterus.    Conjunctiva/sclera: Conjunctivae normal.     Pupils: Pupils are equal, round, and reactive to light.  Neck:     Thyroid: No thyromegaly.     Vascular: No carotid bruit or JVD.     Trachea: Trachea normal. No tracheal tenderness or tracheal deviation.  Cardiovascular:     Rate and Rhythm: Normal rate and regular rhythm.     Chest Wall: PMI is not displaced.     Pulses: Normal pulses. No decreased pulses.     Heart sounds: Normal heart sounds, S1 normal and S2 normal. Heart sounds not distant. No murmur heard. No systolic murmur is present.  No diastolic murmur is present.    No friction rub. No gallop. No S3 or S4 sounds.  Pulmonary:     Effort: Pulmonary effort is normal. No tachypnea, accessory muscle usage or respiratory distress.     Breath sounds: Normal breath sounds. No stridor.  No decreased breath sounds, wheezing, rhonchi or rales.  Chest:     Chest wall: No  tenderness.  Abdominal:     General: Bowel sounds are normal. There is no distension.     Palpations: Abdomen is soft. Abdomen is not rigid. There is no mass.     Tenderness: There is no abdominal tenderness. There is no right CVA tenderness, left CVA tenderness, guarding or rebound.     Hernia: No hernia is present.     Comments: Mild epigastric tenderness  Musculoskeletal:        General: Normal range of motion.     Cervical back: Normal range of motion and neck supple. No edema, erythema or rigidity. No muscular tenderness. Normal range of motion.     Comments: Patient has severe onychomycosis all toenails and severe callus formation in the feet  Lymphadenopathy:     Head:     Right side of head: No submental or submandibular adenopathy.     Left side of head: No submental or submandibular adenopathy.     Cervical: No cervical adenopathy.  Skin:    General: Skin is warm and dry.     Coloration: Skin is not pale.     Findings: No rash.     Nails: There is no clubbing.  Neurological:     Mental Status: He is alert and oriented to person, place, and time.     Cranial Nerves: Cranial nerves 2-12 are intact.     Sensory: Sensation is intact. No sensory deficit.     Motor: Weakness present. No tremor, atrophy, abnormal muscle tone, seizure activity or pronator drift.     Coordination: Coordination is intact.     Gait: Gait abnormal and tandem walk abnormal.     Comments: Weak left side greater than right side  Psychiatric:        Speech: Speech normal.        Behavior: Behavior normal.    Assessment & Plan:   Problem List Items Addressed This Visit       Cardiovascular and Mediastinum   HTN (hypertension) - Primary    Hypertension not well controlled plan to switch Zestoretic to valsartan HCT 320/25 daily and continue amlodipine 10 mg daily  Plan to check metabolic panel       Relevant Medications   amLODipine (NORVASC) 10 MG tablet   atorvastatin (LIPITOR) 40 MG tablet    carvedilol (COREG) 25 MG tablet   valsartan-hydrochlorothiazide (DIOVAN-HCT) 320-25 MG tablet   Other Relevant Orders   Comprehensive metabolic panel   CBC with Differential/Platelet   Acute ischemic right MCA stroke (HCC)    Continue high-dose aspirin and Plavix  Get patient back in with neurology  post Stone follow-up       Relevant Medications   amLODipine (NORVASC) 10 MG tablet   atorvastatin (LIPITOR) 40 MG tablet   carvedilol (COREG) 25 MG tablet   valsartan-hydrochlorothiazide (DIOVAN-HCT) 320-25 MG tablet     Respiratory   COPD with chronic bronchitis (HCC)    Continue inhaled medications as prescribed         Endocrine   DM (diabetes mellitus), type 2 (HCC)    Not at goal plan to change metformin to 1000 mg daily and increase insulin glargine to 15 units daily       Relevant Medications   atorvastatin (LIPITOR) 40 MG tablet   Insulin Glargine (BASAGLAR KWIKPEN) 100 UNIT/ML   valsartan-hydrochlorothiazide (DIOVAN-HCT) 320-25 MG tablet   metFORMIN (GLUCOPHAGE) 500  MG tablet   Other Relevant Orders   Comprehensive metabolic panel     Other   Homelessness    Currently living in a shelter environment       Tobacco use       Current smoking consumption amount: 5 cigarettes a day  Dicsussion on advise to quit smoking and smoking impacts: Lung cardiovascular impacts  Patient's willingness to quit: Willing to quit  Methods to quit smoking discussed: Behavioral modification  Medication management of smoking session drugs discussed: Nicotine replacement  Resources provided:  AVS   Setting quit date not established  Follow-up arranged 2 months   Time spent counseling the patient: 5 minutes       Other Visit Diagnoses     Mixed hyperlipidemia       Relevant Medications   amLODipine (NORVASC) 10 MG tablet   atorvastatin (LIPITOR) 40 MG tablet   carvedilol (COREG) 25 MG tablet   valsartan-hydrochlorothiazide (DIOVAN-HCT) 320-25 MG tablet    Other Relevant Orders   Lipid panel      Outpatient Encounter Medications as of 04/05/2022  Medication Sig   valsartan-hydrochlorothiazide (DIOVAN-HCT) 320-25 MG tablet Take 1 tablet by mouth daily.   albuterol (VENTOLIN HFA) 108 (90 Base) MCG/ACT inhaler Inhale 2 puffs into the lungs every 6 (six) hours as needed for wheezing or shortness of breath.   amLODipine (NORVASC) 10 MG tablet Take 1 tablet (10 mg total) by mouth daily.   aspirin 325 MG tablet Take 1 tablet (325 mg total) by mouth daily.   atorvastatin (LIPITOR) 40 MG tablet Take 1 tablet (40 mg total) by mouth daily.   Blood Glucose Monitoring Suppl (TRUE METRIX METER) w/Device KIT Use as directed daily to check blood sugar   carvedilol (COREG) 25 MG tablet Take 1 tablet (25 mg total) by mouth 2 (two) times daily with a meal.   clopidogrel (PLAVIX) 75 MG tablet Take 1 tablet (75 mg total) by mouth daily.   fluticasone-salmeterol (ADVAIR HFA) 115-21 MCG/ACT inhaler Inhale 2 puffs into the lungs 2 (two) times daily.   glucose blood (TRUE METRIX BLOOD GLUCOSE TEST) test strip Use as instructed   Insulin Glargine (BASAGLAR KWIKPEN) 100 UNIT/ML Inject 15 Units into the skin daily.   Insulin Pen Needle (UNIFINE PENTIPS) 31G X 8 MM MISC Use with insulin pen   metFORMIN (GLUCOPHAGE) 500 MG tablet Take 2 tablets (1,000 mg total) by mouth daily with breakfast.   nicotine (NICODERM CQ - DOSED IN MG/24 HOURS) 21 mg/24hr patch Place 1 patch (21 mg total) onto the skin daily.   pantoprazole (PROTONIX) 40 MG tablet Take 1 tablet (40 mg total) by mouth daily.   polyethylene glycol powder (GLYCOLAX/MIRALAX) 17 GM/SCOOP powder Take 17 g by mouth 2 (two) times daily as needed.   thiamine 100 MG tablet Take 1 tablet (100 mg total) by mouth daily.   TRUEplus Lancets 28G MISC Check blood sugar daily   vitamin B-12 (CYANOCOBALAMIN) 1000 MCG tablet Take 1 tablet (1,000 mcg total) by mouth daily.   [DISCONTINUED] amLODipine (NORVASC) 10 MG tablet Take 1  tablet (10 mg total) by mouth daily.   [DISCONTINUED] atorvastatin (LIPITOR) 40 MG tablet Take 1 tablet (40 mg total) by mouth daily.   [DISCONTINUED] carvedilol (COREG) 25 MG tablet Take 1 tablet (25 mg total) by mouth 2 (two) times daily with a meal.   [DISCONTINUED] Insulin Glargine (BASAGLAR KWIKPEN) 100 UNIT/ML Inject 10 Units into the skin daily.   [DISCONTINUED] lisinopril-hydrochlorothiazide (ZESTORETIC) 20-25 MG tablet  Take 1 tablet by mouth daily.   [DISCONTINUED] metFORMIN (GLUCOPHAGE) 500 MG tablet Take 1 tablet (500 mg total) by mouth 2 (two) times daily with a meal. (Patient not taking: Reported on 03/25/2022)   [DISCONTINUED] traMADol (ULTRAM) 50 MG tablet Take 1 tablet (50 mg total) by mouth every 6 (six) hours as needed for moderate pain.   No facility-administered encounter medications on file as of 04/05/2022.  Need referral to neurology for post Stone follow-up Keep physical therapy speech therapy follow-up June 6 at 10:30 AM  45 minutes spent with assessment following up old records Follow-up: Return in about 2 months (around 06/05/2022).   Asencion Noble, MD

## 2022-04-05 ENCOUNTER — Ambulatory Visit: Payer: Medicaid Other | Attending: Critical Care Medicine | Admitting: Critical Care Medicine

## 2022-04-05 ENCOUNTER — Encounter: Payer: Self-pay | Admitting: Pediatric Intensive Care

## 2022-04-05 ENCOUNTER — Other Ambulatory Visit (HOSPITAL_COMMUNITY): Payer: Self-pay

## 2022-04-05 ENCOUNTER — Encounter: Payer: Self-pay | Admitting: Critical Care Medicine

## 2022-04-05 ENCOUNTER — Telehealth: Payer: Self-pay

## 2022-04-05 VITALS — BP 150/92 | HR 76 | Wt 172.0 lb

## 2022-04-05 DIAGNOSIS — I63511 Cerebral infarction due to unspecified occlusion or stenosis of right middle cerebral artery: Secondary | ICD-10-CM | POA: Diagnosis not present

## 2022-04-05 DIAGNOSIS — I10 Essential (primary) hypertension: Secondary | ICD-10-CM

## 2022-04-05 DIAGNOSIS — E782 Mixed hyperlipidemia: Secondary | ICD-10-CM

## 2022-04-05 DIAGNOSIS — Z5900 Homelessness unspecified: Secondary | ICD-10-CM

## 2022-04-05 DIAGNOSIS — Z720 Tobacco use: Secondary | ICD-10-CM

## 2022-04-05 DIAGNOSIS — E1165 Type 2 diabetes mellitus with hyperglycemia: Secondary | ICD-10-CM

## 2022-04-05 DIAGNOSIS — J449 Chronic obstructive pulmonary disease, unspecified: Secondary | ICD-10-CM

## 2022-04-05 DIAGNOSIS — Z794 Long term (current) use of insulin: Secondary | ICD-10-CM

## 2022-04-05 MED ORDER — BASAGLAR KWIKPEN 100 UNIT/ML ~~LOC~~ SOPN: 15.0000 [IU] | PEN_INJECTOR | Freq: Every day | SUBCUTANEOUS | 2 refills | Status: DC

## 2022-04-05 MED ORDER — ATORVASTATIN CALCIUM 40 MG PO TABS
40.0000 mg | ORAL_TABLET | Freq: Every day | ORAL | 1 refills | Status: DC
  Filled 2022-04-05: qty 30, 30d supply, fill #0

## 2022-04-05 MED ORDER — VALSARTAN-HYDROCHLOROTHIAZIDE 320-25 MG PO TABS
1.0000 | ORAL_TABLET | Freq: Every day | ORAL | 3 refills | Status: DC
  Filled 2022-04-05: qty 30, 30d supply, fill #0

## 2022-04-05 MED ORDER — AMLODIPINE BESYLATE 10 MG PO TABS
10.0000 mg | ORAL_TABLET | Freq: Every day | ORAL | 1 refills | Status: DC
  Filled 2022-04-05: qty 30, 30d supply, fill #0

## 2022-04-05 MED ORDER — METFORMIN HCL 500 MG PO TABS
1000.0000 mg | ORAL_TABLET | Freq: Every day | ORAL | 1 refills | Status: DC
  Filled 2022-04-05: qty 60, 30d supply, fill #0
  Filled 2022-05-05: qty 60, 30d supply, fill #1

## 2022-04-05 MED ORDER — CARVEDILOL 25 MG PO TABS
25.0000 mg | ORAL_TABLET | Freq: Two times a day (BID) | ORAL | 3 refills | Status: DC
  Filled 2022-04-05: qty 60, 30d supply, fill #0

## 2022-04-05 NOTE — Assessment & Plan Note (Signed)
Continue inhaled medications as prescribed 

## 2022-04-05 NOTE — Assessment & Plan Note (Signed)
Not at goal plan to change metformin to 1000 mg daily and increase insulin glargine to 15 units daily

## 2022-04-05 NOTE — Telephone Encounter (Signed)
I met with the patient when he was in the clinic today. He is aware that [facility] filed his disability application with [employer] on 03/08/2022.  When I asked if he has heard anything from [facility] he said that his mail goes to his daughter's house in [place] and he needs to pick it up.  He is able to take the bus but needs PART bus passes and this clinic does not have any.  I informed him that I would check with Lisette Abu, RN/CNP about the bus passes.   He said that his daughter told him that he has a letter from [facility] but does not want to open it until he with her. I explained that there may be some time sensitive information in the letter and he needs to open it as soon as possible and he said he understood.  I contacted Lisette Abu, RN/CNP regarding transportation to [employer] and she said his daughter is going to bring him his mail

## 2022-04-05 NOTE — Assessment & Plan Note (Signed)
  .   Current smoking consumption amount: 5 cigarettes a day  . Dicsussion on advise to quit smoking and smoking impacts: Lung cardiovascular impacts  . Patient's willingness to quit: Willing to quit  . Methods to quit smoking discussed: Behavioral modification  . Medication management of smoking session drugs discussed: Nicotine replacement  . Resources provided:  AVS   . Setting quit date not established  . Follow-up arranged 2 months   Time spent counseling the patient: 5 minutes

## 2022-04-05 NOTE — Assessment & Plan Note (Signed)
Continue high-dose aspirin and Plavix  Get patient back in with neurology  post hospital follow-up

## 2022-04-05 NOTE — Assessment & Plan Note (Signed)
Currently living in a shelter environment

## 2022-04-05 NOTE — Patient Instructions (Signed)
Refills on your medications will be sent to our patient care pharmacy and Dr. Delford Field will bring those medicines to you tomorrow at the shelter  Note Zestoretic will be changed to valsartan HCT 1 daily for blood pressure  Increase your insulin to 15 units daily  Change metformin to 2 pills daily with breakfast and only take the metformin once a day  Keep your upcoming speech-language and physical therapy appointments on June 6  We will work on transportation for you to [employer] has your case and is working on your Social Security disability we will follow-up with them  Return to see Dr. Delford Field in this clinic in 2 months and we will continue to monitor you closely at the shelter

## 2022-04-05 NOTE — Assessment & Plan Note (Addendum)
Hypertension not well controlled plan to switch Zestoretic to valsartan HCT 320/25 daily and continue amlodipine 10 mg daily  Plan to check metabolic panel

## 2022-04-05 NOTE — Congregational Nurse Program (Signed)
  Dept: [phone]   Congregational Nurse Program Note  Date of Encounter: 04/05/2022  Past Medical History: Past Medical History:  Diagnosis Date   Closed fracture dislocation of lumbar spine ([place]) 1987   COPD (chronic obstructive pulmonary disease) ([place])    Diabetes mellitus without complication (HCC)    GERD (gastroesophageal reflux disease)    High cholesterol    Hypertension     Encounter Details: Call to client- ID by name and DOB. CCN advised client of appointment at [facility] this afternoon arrive by 1445. Client was unaware of appointment. His phone is not receiving text messages. Client states he missed his speech appointment yesterday due to no transportation. CCN scheduled ride to today's appointment and sent staff message to Etter Sjogren at [facility] to provide return ride via taxi.  Lisette Abu BSN RN CCNP [phone]

## 2022-04-06 LAB — CBC WITH DIFFERENTIAL/PLATELET
Basophils Absolute: 0 10*3/uL (ref 0.0–0.2)
Basos: 0 %
EOS (ABSOLUTE): 0.2 10*3/uL (ref 0.0–0.4)
Eos: 2 %
Hematocrit: 42.2 % (ref 37.5–51.0)
Hemoglobin: 14.1 g/dL (ref 13.0–17.7)
Immature Grans (Abs): 0 10*3/uL (ref 0.0–0.1)
Immature Granulocytes: 0 %
Lymphocytes Absolute: 2.4 10*3/uL (ref 0.7–3.1)
Lymphs: 26 %
MCH: 29.9 pg (ref 26.6–33.0)
MCHC: 33.4 g/dL (ref 31.5–35.7)
MCV: 90 fL (ref 79–97)
Monocytes Absolute: 0.7 10*3/uL (ref 0.1–0.9)
Monocytes: 7 %
Neutrophils Absolute: 5.7 10*3/uL (ref 1.4–7.0)
Neutrophils: 65 %
Platelets: 280 10*3/uL (ref 150–450)
RBC: 4.71 x10E6/uL (ref 4.14–5.80)
RDW: 12.9 % (ref 11.6–15.4)
WBC: 9 10*3/uL (ref 3.4–10.8)

## 2022-04-06 LAB — LIPID PANEL
Chol/HDL Ratio: 4.7 ratio (ref 0.0–5.0)
Cholesterol, Total: 178 mg/dL (ref 100–199)
HDL: 38 mg/dL — ABNORMAL LOW (ref >39)
LDL Chol Calc (NIH): 76 mg/dL (ref 0–99)
Triglycerides: 399 mg/dL — ABNORMAL HIGH (ref 0–149)
VLDL Cholesterol Cal: 64 mg/dL — ABNORMAL HIGH (ref 5–40)

## 2022-04-06 LAB — COMPREHENSIVE METABOLIC PANEL
ALT: 38 IU/L (ref 0–44)
AST: 18 IU/L (ref 0–40)
Albumin/Globulin Ratio: 1.8 (ref 1.2–2.2)
Albumin: 4.6 g/dL (ref 3.8–4.9)
Alkaline Phosphatase: 111 IU/L (ref 44–121)
BUN/Creatinine Ratio: 22 — ABNORMAL HIGH (ref 9–20)
BUN: 22 mg/dL (ref 6–24)
Bilirubin Total: 0.2 mg/dL (ref 0.0–1.2)
CO2: 26 mmol/L (ref 20–29)
Calcium: 9.7 mg/dL (ref 8.7–10.2)
Chloride: 99 mmol/L (ref 96–106)
Creatinine, Ser: 1.02 mg/dL (ref 0.76–1.27)
Globulin, Total: 2.6 g/dL (ref 1.5–4.5)
Glucose: 286 mg/dL — ABNORMAL HIGH (ref 70–99)
Potassium: 4.4 mmol/L (ref 3.5–5.2)
Sodium: 141 mmol/L (ref 134–144)
Total Protein: 7.2 g/dL (ref 6.0–8.5)
eGFR: 86 mL/min/{1.73_m2} (ref >59)

## 2022-04-07 ENCOUNTER — Other Ambulatory Visit: Payer: Self-pay | Admitting: *Deleted

## 2022-04-07 MED ORDER — PANTOPRAZOLE SODIUM 40 MG PO TBEC
40.0000 mg | DELAYED_RELEASE_TABLET | Freq: Every day | ORAL | 3 refills | Status: DC
  Filled 2022-04-07: qty 30, 30d supply, fill #0

## 2022-04-07 MED ORDER — CARVEDILOL 25 MG PO TABS
25.0000 mg | ORAL_TABLET | Freq: Two times a day (BID) | ORAL | 3 refills | Status: DC
  Filled 2022-04-07: qty 60, 30d supply, fill #0

## 2022-04-07 MED ORDER — CLOPIDOGREL BISULFATE 75 MG PO TABS
75.0000 mg | ORAL_TABLET | Freq: Every day | ORAL | 2 refills | Status: DC
  Filled 2022-04-07: qty 30, 30d supply, fill #0

## 2022-04-08 ENCOUNTER — Other Ambulatory Visit: Payer: Self-pay

## 2022-04-12 ENCOUNTER — Other Ambulatory Visit: Payer: Self-pay

## 2022-04-12 ENCOUNTER — Telehealth: Payer: Self-pay | Admitting: Pharmacist

## 2022-04-12 MED ORDER — FLUTICASONE-SALMETEROL 100-50 MCG/ACT IN AEPB
1.0000 | INHALATION_SPRAY | Freq: Two times a day (BID) | RESPIRATORY_TRACT | 2 refills | Status: DC
  Filled 2022-04-12: qty 60, 30d supply, fill #0

## 2022-04-12 NOTE — Addendum Note (Signed)
Addended by: Daisy Blossom, Annie Main L on: 04/12/2022 02:02 PM   Modules accepted: Orders

## 2022-04-12 NOTE — Telephone Encounter (Signed)
Lets get him advair 100 disk 1 puff bid

## 2022-04-12 NOTE — Telephone Encounter (Signed)
Dr. Delford Field,   We have autosub but I want to run this one by you before we make a change. We are getting this pt PASS for ICS/LABA. We cannot get him the Advair HFA.   We can get Advair for free for the following strengths:  -100/50 -500/50  We can get Breo for free at either strength.   I know these may not be ideal as they are DPI inhalers vs HFAs, but would you consider switching in for PASS? Let me know your thoughts and I can send in the rx if needed.

## 2022-04-12 NOTE — Telephone Encounter (Signed)
Yes sir, done!

## 2022-04-14 ENCOUNTER — Encounter: Payer: Self-pay | Admitting: *Deleted

## 2022-04-14 ENCOUNTER — Other Ambulatory Visit: Payer: Self-pay | Admitting: *Deleted

## 2022-04-14 MED ORDER — CLOPIDOGREL BISULFATE 75 MG PO TABS
75.0000 mg | ORAL_TABLET | Freq: Every day | ORAL | 2 refills | Status: DC
  Filled 2022-04-14: qty 30, 30d supply, fill #0

## 2022-04-14 MED ORDER — PANTOPRAZOLE SODIUM 40 MG PO TBEC: 40.0000 mg | DELAYED_RELEASE_TABLET | Freq: Every day | ORAL | 3 refills | Status: DC

## 2022-04-14 NOTE — Congregational Nurse Program (Signed)
Pt was seen in clinic at [facility] by RN and 2 student nurses from [place]. He is agitated today due to his environment, his inability to move well and communicate well. Will continue to follow closely

## 2022-04-14 NOTE — Congregational Nurse Program (Signed)
  Dept: [phone]   Congregational Nurse Program Note  Date of Encounter: 04/14/2022  Past Medical History: Past Medical History:  Diagnosis Date   Closed fracture dislocation of lumbar spine (HCC) 1987   COPD (chronic obstructive pulmonary disease) (HCC)    Diabetes mellitus without complication (HCC)    GERD (gastroesophageal reflux disease)    High cholesterol    Hypertension     Encounter Details:  CNP Questionnaire - 04/14/22 0941       Questionnaire   Location Patient Served  [facility]    Visit Setting Church or Organization    Patient Status Homeless    Insurance Referral Medicaid    Medication Have Medication Insecurities    Medical Provider Yes    Screening Referrals N/A    Medical Referral Other    Medical Appointment Made N/A    Food Have Food Insecurities               Dept: [phone]   Congregational Nurse Program Note  Date of Encounter: 04/14/2022  Past Medical History: Past Medical History:  Diagnosis Date   Closed fracture dislocation of lumbar spine (HCC) 1987   COPD (chronic obstructive pulmonary disease) (HCC)    Diabetes mellitus without complication (HCC)    GERD (gastroesophageal reflux disease)    High cholesterol    Hypertension     Encounter Details:  CNP Questionnaire - 04/14/22 0941       Questionnaire   Location Patient Served  [facility]    Visit Setting Church or Organization    Patient Status Homeless    Insurance Referral Medicaid    Medication Have Medication Insecurities    Medical Provider Yes    Screening Referrals N/A    Medical Referral Other    Medical Appointment Made N/A    Food Have Food Insecurities

## 2022-04-15 ENCOUNTER — Other Ambulatory Visit: Payer: Self-pay

## 2022-04-19 ENCOUNTER — Ambulatory Visit: Payer: Self-pay | Attending: Critical Care Medicine | Admitting: Physical Therapy

## 2022-04-19 ENCOUNTER — Other Ambulatory Visit: Payer: Self-pay

## 2022-04-19 ENCOUNTER — Ambulatory Visit: Payer: Self-pay | Admitting: Speech Pathology

## 2022-04-21 ENCOUNTER — Other Ambulatory Visit: Payer: Self-pay | Admitting: Critical Care Medicine

## 2022-04-21 ENCOUNTER — Encounter: Payer: Self-pay | Admitting: *Deleted

## 2022-04-21 ENCOUNTER — Other Ambulatory Visit: Payer: Self-pay | Admitting: *Deleted

## 2022-04-21 ENCOUNTER — Other Ambulatory Visit (HOSPITAL_COMMUNITY): Payer: Self-pay

## 2022-04-21 MED ORDER — BASAGLAR KWIKPEN 100 UNIT/ML ~~LOC~~ SOPN: 15.0000 [IU] | PEN_INJECTOR | Freq: Every day | SUBCUTANEOUS | 2 refills | Status: DC

## 2022-04-21 MED ORDER — BASAGLAR KWIKPEN 100 UNIT/ML ~~LOC~~ SOPN
15.0000 [IU] | PEN_INJECTOR | Freq: Every day | SUBCUTANEOUS | 2 refills | Status: DC
  Filled 2022-04-21 (×2): qty 3, 20d supply, fill #0
  Filled 2022-05-05: qty 3, 20d supply, fill #1

## 2022-04-21 NOTE — Congregational Nurse Program (Signed)
Pt c/o R back neck pain, ongoing for 2 weeks appr, gradually worsening, sent dr Delford Field a message for direction.

## 2022-04-22 ENCOUNTER — Other Ambulatory Visit (HOSPITAL_COMMUNITY): Payer: Self-pay

## 2022-04-22 NOTE — Telephone Encounter (Signed)
Medication was refilled by PCP 04/21/22. This is a duplicate request, will refuse request.  Requested Prescriptions  Pending Prescriptions Disp Refills  . Insulin Glargine (BASAGLAR KWIKPEN) 100 UNIT/ML 3 mL 2    Sig: Inject 10 Units into the skin daily.     Endocrinology:  Diabetes - Insulins Failed - 04/21/2022  5:06 PM      Failed - HBA1C is between 0 and 7.9 and within 180 days    HbA1c, POC (controlled diabetic range)  Date Value Ref Range Status  01/18/2022 9.1 (A) 0.0 - 7.0 % Final   Hgb A1c MFr Bld  Date Value Ref Range Status  03/26/2022 8.2 (H) 4.8 - 5.6 % Final    Comment:    (NOTE) Pre diabetes:          5.7%-6.4%  Diabetes:              >6.4%  Glycemic control for   <7.0% adults with diabetes          Passed - Valid encounter within last 6 months    Recent Outpatient Visits          2 weeks ago Acute ischemic right MCA stroke [facility])   [facility] [name], MD   2 months ago Type 2 diabetes mellitus with hyperglycemia, without long-term current use of insulin [facility])   [facility], Cari S, PA-C   3 months ago Type 2 diabetes mellitus without complication, without long-term current use of insulin [facility])   [facility] [name], MD   3 years ago Essential hypertension   [facility] [name], MD

## 2022-04-23 ENCOUNTER — Other Ambulatory Visit: Payer: Self-pay | Admitting: *Deleted

## 2022-04-23 MED ORDER — THIAMINE HCL 100 MG PO TABS
100.0000 mg | ORAL_TABLET | Freq: Every day | ORAL | 0 refills | Status: DC
  Filled 2022-04-23: qty 30, 30d supply, fill #0

## 2022-04-23 MED ORDER — VITAMIN B-12 1000 MCG PO TABS
1000.0000 ug | ORAL_TABLET | Freq: Every day | ORAL | 2 refills | Status: DC
  Filled 2022-04-23: qty 30, 30d supply, fill #0

## 2022-04-25 ENCOUNTER — Other Ambulatory Visit: Payer: Self-pay

## 2022-04-27 ENCOUNTER — Other Ambulatory Visit (HOSPITAL_COMMUNITY): Payer: Self-pay

## 2022-04-27 ENCOUNTER — Encounter: Payer: Self-pay | Admitting: Physician Assistant

## 2022-04-27 ENCOUNTER — Other Ambulatory Visit: Payer: Self-pay | Admitting: Critical Care Medicine

## 2022-04-27 MED ORDER — CYCLOBENZAPRINE HCL 10 MG PO TABS
10.0000 mg | ORAL_TABLET | Freq: Three times a day (TID) | ORAL | 0 refills | Status: DC | PRN
  Filled 2022-04-27: qty 90, 30d supply, fill #0

## 2022-04-27 MED ORDER — MELOXICAM 15 MG PO TABS
15.0000 mg | ORAL_TABLET | Freq: Every day | ORAL | 0 refills | Status: DC
  Filled 2022-04-27: qty 30, 30d supply, fill #0

## 2022-04-27 MED ORDER — FLUTICASONE-SALMETEROL 100-50 MCG/ACT IN AEPB
1.0000 | INHALATION_SPRAY | Freq: Two times a day (BID) | RESPIRATORY_TRACT | 2 refills | Status: DC
  Filled 2022-04-27: qty 60, 30d supply, fill #0

## 2022-04-27 NOTE — Progress Notes (Unsigned)
Pt seen by Dr Joya Gaskins.  He got a new phone, [phone]  He is using the pill organizer and he has been taking his meds. He keeps the meds behind the counter, keeps the [facility] equipment with him.  He has not gotten the inhaler.   He has been having neck pain for about a month, but has gotten worse in the last week. He can barely turn his head to the right.   He loses feeling in his R arm more frequently. He is right-handed and   He has severe spasticity in the strap muscles, trapezius and it goes down his back.   The R arm is weaker than previously. He has had weakness in his R side since the stroke, but this is worse.  The L side is not getting worse.   From MRI C-spine 03/26/2022 IMPRESSION: 1. 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 where there is advanced right foraminal impingement and right ventral cord indentation. 2. Mild to moderate foraminal narrowing bilaterally at C3-4 and on the left at C4-5.  He does not have the [employer], the [employer] will contact him and help him fill out the application.   He will get meloxicam 15 mg qd and Flexeril 10 mg prn plus Advair inh.   Rosaria Ferries, PA-C 04/27/2022 4:12 PM

## 2022-04-28 ENCOUNTER — Other Ambulatory Visit: Payer: Self-pay | Admitting: *Deleted

## 2022-04-28 ENCOUNTER — Other Ambulatory Visit: Payer: Self-pay | Admitting: Pharmacist

## 2022-04-28 ENCOUNTER — Other Ambulatory Visit: Payer: Self-pay

## 2022-04-28 MED ORDER — CARVEDILOL 25 MG PO TABS
25.0000 mg | ORAL_TABLET | Freq: Two times a day (BID) | ORAL | 3 refills | Status: DC
  Filled 2022-04-28: qty 60, 30d supply, fill #0

## 2022-04-28 MED ORDER — VITAMIN B-12 1000 MCG PO TABS
1000.0000 ug | ORAL_TABLET | Freq: Every day | ORAL | 2 refills | Status: DC
  Filled 2022-04-28: qty 30, 30d supply, fill #0

## 2022-04-28 MED ORDER — THIAMINE HCL 100 MG PO TABS
100.0000 mg | ORAL_TABLET | Freq: Every day | ORAL | 0 refills | Status: DC
  Filled 2022-04-28: qty 30, 30d supply, fill #0

## 2022-04-28 MED ORDER — CLOPIDOGREL BISULFATE 75 MG PO TABS
75.0000 mg | ORAL_TABLET | Freq: Every day | ORAL | 2 refills | Status: DC
  Filled 2022-04-28 – 2022-05-05 (×2): qty 30, 30d supply, fill #0

## 2022-04-28 MED ORDER — VALSARTAN-HYDROCHLOROTHIAZIDE 320-25 MG PO TABS
1.0000 | ORAL_TABLET | Freq: Every day | ORAL | 3 refills | Status: DC
  Filled 2022-04-28 – 2022-05-05 (×2): qty 30, 30d supply, fill #0

## 2022-04-28 MED ORDER — LISINOPRIL-HYDROCHLOROTHIAZIDE 20-25 MG PO TABS
1.0000 | ORAL_TABLET | Freq: Every day | ORAL | 2 refills | Status: DC
  Filled 2022-04-28: qty 30, 30d supply, fill #0

## 2022-05-05 ENCOUNTER — Other Ambulatory Visit: Payer: Self-pay

## 2022-05-06 ENCOUNTER — Other Ambulatory Visit: Payer: Self-pay

## 2022-05-09 ENCOUNTER — Inpatient Hospital Stay: Payer: Self-pay | Admitting: Adult Health

## 2022-05-11 ENCOUNTER — Encounter: Payer: Self-pay | Admitting: Critical Care Medicine

## 2022-05-11 ENCOUNTER — Other Ambulatory Visit: Payer: Self-pay | Admitting: Critical Care Medicine

## 2022-05-11 ENCOUNTER — Other Ambulatory Visit (HOSPITAL_COMMUNITY): Payer: Self-pay

## 2022-05-11 ENCOUNTER — Encounter: Payer: Self-pay | Admitting: Physician Assistant

## 2022-05-11 MED ORDER — BASAGLAR KWIKPEN 100 UNIT/ML ~~LOC~~ SOPN
15.0000 [IU] | PEN_INJECTOR | Freq: Every day | SUBCUTANEOUS | 2 refills | Status: DC
Start: 2022-05-11 — End: 2022-05-26
  Filled 2022-05-11: qty 3, 20d supply, fill #0

## 2022-05-11 NOTE — Progress Notes (Signed)
Pt seen by Dr Delford Field.  He has not been to PT, they have not called him.  Neurology has not called him.  However, the guy who loaned him the phone took it back. He got a new phone:  [phone]  CBG 142, 150, he is taking the insulin. He is about out of insulin, has needles and tips. Has the metformin.  He is taking the meds in the pill box that [place] fills. Taking the Valsartan, BP is good.      05/11/2022    2:51 PM 04/27/2022    3:06 PM 04/14/2022    9:40 AM  Vitals with BMI  Systolic 102 128 203  Diastolic 56 81 93  Pulse 82 76 75   He c/o headaches, also blurred vision and vision changes. L eye works pretty well, R eye does not track and does not work well.  Has seen the dentist, needs extensive dental work, but cannot do that until at least 3 months after his stroke.   This is because he will have to stop the Plavix for the dental work  Messages to [place] about Eye exam and picking up insulin. PT referral sent in by P.W.   [name], PA-C 05/11/2022 3:02 PM

## 2022-05-12 ENCOUNTER — Other Ambulatory Visit: Payer: Self-pay | Admitting: *Deleted

## 2022-05-12 MED ORDER — THIAMINE HCL 100 MG PO TABS
100.0000 mg | ORAL_TABLET | Freq: Every day | ORAL | 0 refills | Status: DC
  Filled 2022-05-12: qty 30, 30d supply, fill #0

## 2022-05-12 MED ORDER — PANTOPRAZOLE SODIUM 40 MG PO TBEC
40.0000 mg | DELAYED_RELEASE_TABLET | Freq: Every day | ORAL | 3 refills | Status: DC
  Filled 2022-05-12 – 2022-05-19 (×2): qty 30, 30d supply, fill #0

## 2022-05-13 ENCOUNTER — Other Ambulatory Visit: Payer: Self-pay

## 2022-05-18 ENCOUNTER — Encounter: Payer: Self-pay | Admitting: *Deleted

## 2022-05-18 ENCOUNTER — Encounter: Payer: Self-pay | Admitting: Physician Assistant

## 2022-05-18 NOTE — Progress Notes (Signed)
Pt seen by Dr Delford Field.  Eye appt details reviewed, bus passes given.   Today's Vitals   05/18/22 1437  BP: 135/84  Pulse: 87  SpO2: 95%   Has a bad headache, has had it for about a month.  Referral had been made to Neuro, had appt 06/26, but it was canceled, no reason given. Pt was to bring $200.00.  He does not have $200.   He has not completed the [employer], he lost his phone, and has not gotten a call on the new phone about the card. Dr Delford Field confirmed the new phone number.  No problems w/ medications.   Dr Delford Field will message the [facility] Cards staff about his problems w/ the card, hopefully they can help him.   [name], PA-C 05/18/2022 2:52 PM

## 2022-05-18 NOTE — Congregational Nurse Program (Signed)
Pt desires f/u for BP. States no other issues

## 2022-05-19 ENCOUNTER — Other Ambulatory Visit (HOSPITAL_COMMUNITY): Payer: Self-pay

## 2022-05-19 ENCOUNTER — Other Ambulatory Visit: Payer: Self-pay | Admitting: Critical Care Medicine

## 2022-05-19 ENCOUNTER — Other Ambulatory Visit: Payer: Self-pay

## 2022-05-19 ENCOUNTER — Other Ambulatory Visit: Payer: Self-pay | Admitting: *Deleted

## 2022-05-19 ENCOUNTER — Encounter: Payer: Self-pay | Admitting: Neurology

## 2022-05-19 DIAGNOSIS — I63511 Cerebral infarction due to unspecified occlusion or stenosis of right middle cerebral artery: Secondary | ICD-10-CM

## 2022-05-19 MED ORDER — VITAMIN B-12 1000 MCG PO TABS
1000.0000 ug | ORAL_TABLET | Freq: Every day | ORAL | 2 refills | Status: DC
  Filled 2022-05-19: qty 30, 30d supply, fill #0

## 2022-05-19 MED ORDER — THIAMINE HCL 100 MG PO TABS
100.0000 mg | ORAL_TABLET | Freq: Every day | ORAL | 0 refills | Status: DC
  Filled 2022-05-19: qty 30, 30d supply, fill #0

## 2022-05-25 ENCOUNTER — Other Ambulatory Visit: Payer: Self-pay | Admitting: Critical Care Medicine

## 2022-05-25 ENCOUNTER — Other Ambulatory Visit: Payer: Self-pay

## 2022-05-25 ENCOUNTER — Other Ambulatory Visit (HOSPITAL_COMMUNITY): Payer: Self-pay

## 2022-05-25 ENCOUNTER — Encounter: Payer: Self-pay | Admitting: Physician Assistant

## 2022-05-25 MED ORDER — CEFDINIR 300 MG PO CAPS
300.0000 mg | ORAL_CAPSULE | Freq: Every day | ORAL | 0 refills | Status: DC
  Filled 2022-05-25: qty 14, 14d supply, fill #0

## 2022-05-25 NOTE — Progress Notes (Signed)
Pt seen by Dr Delford Field.  He woke up Sunday am w/ a knot on the R side of his jaw below and a little in front of his R ear. Over the next 2 days, that area has become more enlarged and tender.   No hearing problems, no ear pain. No sore throat, no cough. No fevers. No problems with his teeth. No drainage at all.   He has DOE, cannot walk for very long before he has to stop. He describes PND, denies orthopnea, but can only sleep on his side.   He saw the Eye MD and was told he is legally blind. He was told it was a vascular issue. L eye is ok, R eye does not see.  [email or web address] [address], [place], [place] [address]  ~2.2 mi [phone]  His O2 sats were 89-92% when first tested, just after he came in from the heat. On recheck in the clinic, they were 96%. Today's Vitals   05/25/22 1412  BP: 110/73  Pulse: 92  Resp: 20  SpO2: (!) 89%  Rales RLL, with some rhonchi that clears w/ coughing.  Has a firm, submandibular adenitis. Pre-auricular lymph nodes are swollen, anterior and posterior. Swelling and tenderness in this area.  Partial cerumen impaction on the R, no infection, some wax on the L, non-occlusive.   Neurologist finally called him, he has an appointment on the 17th.  [employer] say they cannot reach him, we called his phone and it rang. Pt says he has been vigilant about keeping his phone with him and keeping it charged. Dr Delford Field contacted Byrd Hesselbach with them and let her know this, confirmed his number.   [name], PA-C 05/25/2022 2:31 PM

## 2022-05-26 ENCOUNTER — Other Ambulatory Visit: Payer: Self-pay | Admitting: *Deleted

## 2022-05-26 ENCOUNTER — Encounter: Payer: Self-pay | Admitting: *Deleted

## 2022-05-26 ENCOUNTER — Other Ambulatory Visit (HOSPITAL_COMMUNITY): Payer: Self-pay

## 2022-05-26 ENCOUNTER — Other Ambulatory Visit: Payer: Self-pay

## 2022-05-26 MED ORDER — CARVEDILOL 25 MG PO TABS
25.0000 mg | ORAL_TABLET | Freq: Two times a day (BID) | ORAL | 3 refills | Status: DC
Start: 2022-05-26 — End: 2022-06-30
  Filled 2022-05-26: qty 60, 30d supply, fill #0

## 2022-05-26 MED ORDER — BASAGLAR KWIKPEN 100 UNIT/ML ~~LOC~~ SOPN
15.0000 [IU] | PEN_INJECTOR | Freq: Every day | SUBCUTANEOUS | 2 refills | Status: DC
  Filled 2022-05-26: qty 3, 20d supply, fill #0

## 2022-05-26 MED ORDER — THIAMINE HCL 100 MG PO TABS
100.0000 mg | ORAL_TABLET | Freq: Every day | ORAL | 0 refills | Status: DC
  Filled 2022-05-26: qty 30, 30d supply, fill #0

## 2022-05-26 MED ORDER — VITAMIN B-12 1000 MCG PO TABS
1000.0000 ug | ORAL_TABLET | Freq: Every day | ORAL | 2 refills | Status: DC
  Filled 2022-05-26: qty 30, 30d supply, fill #0

## 2022-05-26 MED ORDER — CLOPIDOGREL BISULFATE 75 MG PO TABS
75.0000 mg | ORAL_TABLET | Freq: Every day | ORAL | 2 refills | Status: DC
  Filled 2022-05-26: qty 30, 30d supply, fill #0

## 2022-05-26 NOTE — Congregational Nurse Program (Signed)
Med box filled for the next week. Did not see pt

## 2022-05-27 NOTE — Progress Notes (Deleted)
NEUROLOGY CONSULTATION NOTE  Stanley Stone MRN: [id number] DOB: 1966-07-19  Referring provider: Asencion Noble, MD Primary care provider: Asencion Noble, MD  Reason for consult:  stroke  Assessment/Plan:   Left hemispheric TIA secondary to left MCA occlusion due to large vessel disease Type 2 diabetes mellitus Hypertension Hyperlipidemia Tobacco smoker Cervical ***   Subjective:  Stanley Stone is a 56 year old ***-handed male with HTN, HLD, DM II, smoker, and COPD who presents for stroke.  History supplemented by hospital records.  CT head, MRI brain and MRA head and neck personally reviewed.  For about one week, he had been experiencing right sided weakness and numbness ***.  He was admitted to [facility] on 03/25/2022 for suspected stroke.  CT and follow up MRI of brain showed chronic left basal ganglia and caudate head lacunar infarcts but no acute findings.  MRA of head showed severe intracranial stenosis with left MCA occlusion, right M1 distal high-grade stenosis.  MRA neck showed no occlusion or hemodynamically significant stenosis.  2D echo showed EF 60-65% with no interatrial shunt.  LDL was 76.  Hgb A1c was 8.2.  In addition, he complained of right greater than left side dneck pain as well as lateral forearm numbness on exam.  He had an MRI of cervical spine which revealed right C4-5 foraminal impingement and ventral cord indentation.  He is homeless and had not been on any medications.  He was discharged on ASA $Remo'325mg'aMhAe$  and Plavix $RemoveBe'75mg'tWEIGAGkf$  daily for 3 months followed by ASA monotherapy.  Started on atorvastatin $RemoveBeforeD'40mg'JavOyEChngxylY$  daily.       PAST MEDICAL HISTORY: Past Medical History:  Diagnosis Date   Closed fracture dislocation of lumbar spine ([place]) 1987   COPD (chronic obstructive pulmonary disease) ([place])    CVA (cerebral vascular accident) ([place]) 03/25/2022   Diabetes mellitus without complication (HCC)    GERD (gastroesophageal reflux disease)    High cholesterol     Hypertension     PAST SURGICAL HISTORY: No past surgical history on file.  MEDICATIONS: Current Outpatient Medications on File Prior to Visit  Medication Sig Dispense Refill   albuterol (VENTOLIN HFA) 108 (90 Base) MCG/ACT inhaler Inhale 2 puffs into the lungs every 6 (six) hours as needed for wheezing or shortness of breath. 18 g 0   amLODipine (NORVASC) 10 MG tablet Take 1 tablet (10 mg total) by mouth daily. 60 tablet 1   aspirin 325 MG tablet Take 1 tablet (325 mg total) by mouth daily. 30 tablet 2   atorvastatin (LIPITOR) 40 MG tablet Take 1 tablet (40 mg total) by mouth daily. 60 tablet 1   Blood Glucose Monitoring Suppl (TRUE METRIX METER) w/Device KIT Use as directed daily to check blood sugar 1 kit 0   carvedilol (COREG) 25 MG tablet Take 1 tablet (25 mg total) by mouth 2 (two) times daily with a meal. 60 tablet 3   cefdinir (OMNICEF) 300 MG capsule Take 1 capsule (300 mg total) by mouth daily. 14 capsule 0   clopidogrel (PLAVIX) 75 MG tablet Take 1 tablet (75 mg total) by mouth once daily. 30 tablet 2   cyclobenzaprine (FLEXERIL) 10 MG tablet Take 1 tablet (10 mg total) by mouth 3 (three) times daily as needed for muscle spasms. 90 tablet 0   fluticasone-salmeterol (ADVAIR DISKUS) 100-50 MCG/ACT AEPB Inhale 1 puff into the lungs 2 (two) times daily. 60 each 2   glucose blood (TRUE METRIX BLOOD GLUCOSE TEST) test strip Use as instructed 100 each  1   Insulin Glargine (BASAGLAR KWIKPEN) 100 UNIT/ML Inject 15 Units into the skin daily. 3 mL 2   Insulin Pen Needle (UNIFINE PENTIPS) 31G X 8 MM MISC Use with insulin pen 100 each 2   meloxicam (MOBIC) 15 MG tablet Take 1 tablet (15 mg total) by mouth daily. 30 tablet 0   metFORMIN (GLUCOPHAGE) 500 MG tablet Take 2 tablets (1,000 mg total) by mouth daily with breakfast. 120 tablet 1   nicotine (NICODERM CQ - DOSED IN MG/24 HOURS) 21 mg/24hr patch Place 1 patch (21 mg total) onto the skin daily. 14 patch 0   pantoprazole (PROTONIX) 40 MG  tablet Take 1 tablet (40 mg total) by mouth once daily. 30 tablet 3   polyethylene glycol powder (GLYCOLAX/MIRALAX) 17 GM/SCOOP powder Take 17 g by mouth 2 (two) times daily as needed. 1530 g 1   thiamine 100 MG tablet Take 1 tablet (100 mg total) by mouth once daily. 30 tablet 0   TRUEplus Lancets 28G MISC Check blood sugar daily 100 each 1   valsartan-hydrochlorothiazide (DIOVAN-HCT) 320-25 MG tablet Take 1 tablet by mouth once daily. 60 tablet 3   vitamin B-12 (CYANOCOBALAMIN) 1000 MCG tablet Take 1 tablet (1,000 mcg total) by mouth once daily. 30 tablet 2   No current facility-administered medications on file prior to visit.    ALLERGIES: Allergies  Allergen Reactions   Penicillins     Swell Up ; happened in teens    FAMILY HISTORY: No family history on file.  Objective:  *** General: No acute distress.  Patient appears well-groomed.   Head:  Normocephalic/atraumatic Eyes:  fundi examined but not visualized Neck: supple, no paraspinal tenderness, full range of motion Back: No paraspinal tenderness Heart: regular rate and rhythm Lungs: Clear to auscultation bilaterally. Vascular: No carotid bruits. Neurological Exam: Mental status: alert and oriented to person, place, and time, speech fluent and not dysarthric, language intact. Cranial nerves: CN I: not tested CN II: pupils equal, round and reactive to light, visual fields intact CN III, IV, VI:  full range of motion, no nystagmus, no ptosis CN V: facial sensation intact. CN VII: upper and lower face symmetric CN VIII: hearing intact CN IX, X: gag intact, uvula midline CN XI: sternocleidomastoid and trapezius muscles intact CN XII: tongue midline Bulk & Tone: normal, no fasciculations. Motor:  muscle strength 5/5 throughout Sensation:  Pinprick, temperature and vibratory sensation intact. Deep Tendon Reflexes:  2+ throughout,  toes downgoing.   Finger to nose testing:  Without dysmetria.   Heel to shin:  Without  dysmetria.   Gait:  Normal station and stride.  Romberg negative.    Thank you for allowing me to take part in the care of this patient.  Metta Clines, DO  CC: Asencion Noble, MD

## 2022-05-30 ENCOUNTER — Ambulatory Visit: Payer: Self-pay | Admitting: Neurology

## 2022-06-01 ENCOUNTER — Other Ambulatory Visit: Payer: Self-pay

## 2022-06-01 ENCOUNTER — Encounter: Payer: Self-pay | Admitting: Family Medicine

## 2022-06-01 NOTE — Progress Notes (Signed)
56 yo with hx HTN, COPD, T2DM, legally blind, tobacco abuse, alcohol abuse, recent TIA vs hypoperfusion (discharged 03/28/2022) coming for follow up at the [facility]).  He didn't have money for his neurologist appointment on the 17th.  Working on his orange card.    Also has run out of his insulin.  BG 137 this morning.  No other concerns really today -> Headache is persistent.  Comes and goes.  Worse when he's been out in sun -> sun is the big exacerbating factor.  Not an issue at night or in the morning.  Hasn't had anything to drink (alcohol) in 9 days.  Goal is for 14 days.  Smoking is his next goal.   Vitals 136/84, 95% on RA, HR 77  We'll pick up lantus for him today (it's at pharmacy ready for pickup).  BG in the room today 121.  Encouraged him to use APAP as needed for headaches, stay hydrated.

## 2022-06-02 ENCOUNTER — Encounter: Payer: Self-pay | Admitting: *Deleted

## 2022-06-02 ENCOUNTER — Other Ambulatory Visit: Payer: Self-pay | Admitting: *Deleted

## 2022-06-02 ENCOUNTER — Other Ambulatory Visit: Payer: Self-pay

## 2022-06-02 MED ORDER — THIAMINE HCL 100 MG PO TABS
100.0000 mg | ORAL_TABLET | Freq: Every day | ORAL | 0 refills | Status: DC
  Filled 2022-06-02: qty 30, 30d supply, fill #0

## 2022-06-02 MED ORDER — CLOPIDOGREL BISULFATE 75 MG PO TABS
75.0000 mg | ORAL_TABLET | Freq: Every day | ORAL | 2 refills | Status: DC
  Filled 2022-06-02 – 2022-06-16 (×3): qty 30, 30d supply, fill #0

## 2022-06-02 MED ORDER — VITAMIN B-12 1000 MCG PO TABS
1000.0000 ug | ORAL_TABLET | Freq: Every day | ORAL | 2 refills | Status: DC
  Filled 2022-06-02: qty 30, 30d supply, fill #0

## 2022-06-02 MED ORDER — VALSARTAN-HYDROCHLOROTHIAZIDE 320-25 MG PO TABS
1.0000 | ORAL_TABLET | Freq: Every day | ORAL | 3 refills | Status: DC
  Filled 2022-06-02 – 2022-06-09 (×2): qty 30, 30d supply, fill #0
  Filled 2022-06-16: qty 60, 60d supply, fill #0

## 2022-06-02 NOTE — Congregational Nurse Program (Signed)
Filled pill box and ordered refills

## 2022-06-08 ENCOUNTER — Encounter: Payer: Self-pay | Admitting: Physician Assistant

## 2022-06-08 NOTE — Progress Notes (Signed)
Pt seen by Dr Delford Field.  Pt has been having problems w/ CBGs, it was 240 before breakfast, 140-150 after lunch. He is trying not to eat much sugar. Will have eggs and biscuit for breakfast, does not eat the sausage.   CBG in the office was 121 on the 19th.    He takes the metformin x 2 with breakfast, takes the insulin after breakfast.   He is requested to take the insulin before breakfast, can keep it upstairs, take it before he goes downstairs. He is asked to check his CBG in the evening, before dinner.   Today's Vitals   06/08/22 1553  BP: (!) 143/91  Pulse: 90  Resp: 20  SpO2: 99%   The swelling in his neck is improved, he has completed ABX >> Cefdinir. No pain.  He is to get a letter from [employer] about their representing him.   [name], PA-C 06/08/2022 4:07 PM

## 2022-06-09 ENCOUNTER — Other Ambulatory Visit: Payer: Self-pay

## 2022-06-15 ENCOUNTER — Encounter: Payer: Self-pay | Admitting: Physician Assistant

## 2022-06-15 ENCOUNTER — Other Ambulatory Visit: Payer: Self-pay | Admitting: Critical Care Medicine

## 2022-06-15 NOTE — Progress Notes (Cosign Needed Addendum)
Pt seen by Dr Delford Field.  Disability Lawyer has not yet received the Eye MD report.  PW called because there should not be this folderol.  End result: [name] will pick up the report and take it over there.   He saw the dentist about a month ago, he cannot do any more work until he is 3 months out from the CVA.   He was able to walk to [employer] and back because it was a little cooler.  He is using the inhaler, more when it is hot.   Appointments were reviewed with pt. General medical exam 08/09 >> importance of being there was emphasized.  He was given 2 bus tickets for this.   OK to get the [facility] card while working on disability. His daughter is helping him with this, but works full time and has a family, so cannot do much.   Has not seen Neurology because they want $150 or so on arrival.   He is blind in his R eye, the reason for this was explained to the pt. Has decreased vision in his L eye, importance of compliance w/ ASA +/- Plavix was emphasized. The Eye MD said cheaters would not help, but pt got benefit from 2.50 cheaters.   Today's Vitals   06/15/22 1506  BP: (!) 140/83  Pulse: 84  Resp: 20  SpO2: 96%  Lungs clear, HRRR, No LE edema Throat normal on exam, extensive but non-occlusive ear wax bilaterally. He was given ear drops for this.   CBG 122, he has been compliant w/ checking his sugars and trying to cut back on carbohydrates. [name] is aware that his insulin is low, she will order.  [name], PA-C 06/15/2022 3:26 PM

## 2022-06-16 ENCOUNTER — Other Ambulatory Visit: Payer: Self-pay

## 2022-06-16 ENCOUNTER — Other Ambulatory Visit: Payer: Self-pay | Admitting: *Deleted

## 2022-06-16 ENCOUNTER — Other Ambulatory Visit (HOSPITAL_COMMUNITY): Payer: Self-pay

## 2022-06-16 ENCOUNTER — Encounter: Payer: Self-pay | Admitting: *Deleted

## 2022-06-16 MED ORDER — CLOPIDOGREL BISULFATE 75 MG PO TABS
75.0000 mg | ORAL_TABLET | Freq: Every day | ORAL | 2 refills | Status: DC
  Filled 2022-06-16: qty 30, 30d supply, fill #0

## 2022-06-16 MED ORDER — BASAGLAR KWIKPEN 100 UNIT/ML ~~LOC~~ SOPN
15.0000 [IU] | PEN_INJECTOR | Freq: Every day | SUBCUTANEOUS | 2 refills | Status: DC
  Filled 2022-06-16: qty 3, 20d supply, fill #0

## 2022-06-16 MED ORDER — VALSARTAN-HYDROCHLOROTHIAZIDE 320-25 MG PO TABS
1.0000 | ORAL_TABLET | Freq: Every day | ORAL | 3 refills | Status: DC
  Filled 2022-06-16: qty 60, 60d supply, fill #0

## 2022-06-16 MED ORDER — PANTOPRAZOLE SODIUM 40 MG PO TBEC
40.0000 mg | DELAYED_RELEASE_TABLET | Freq: Every day | ORAL | 3 refills | Status: DC
  Filled 2022-06-16: qty 30, 30d supply, fill #0

## 2022-06-16 MED ORDER — THIAMINE HCL 100 MG PO TABS
100.0000 mg | ORAL_TABLET | Freq: Every day | ORAL | 0 refills | Status: DC
  Filled 2022-06-16: qty 30, 30d supply, fill #0
  Filled 2022-06-16: qty 100, 100d supply, fill #0

## 2022-06-16 MED ORDER — VITAMIN B-12 1000 MCG PO TABS
1000.0000 ug | ORAL_TABLET | Freq: Every day | ORAL | 0 refills | Status: DC
  Filled 2022-06-16: qty 30, 30d supply, fill #0
  Filled 2022-06-16: qty 100, 100d supply, fill #0

## 2022-06-16 NOTE — Congregational Nurse Program (Signed)
Pillbox made ready Took paperwork r/t blindness to Stanley Stone

## 2022-06-21 ENCOUNTER — Other Ambulatory Visit (HOSPITAL_COMMUNITY): Payer: Self-pay

## 2022-06-26 NOTE — Progress Notes (Signed)
New Patient Office Visit  Subjective:  Patient ID: Stanley Stone, male    DOB: Oct 25, 1966  Age: 56 y.o. MRN: [id number]  CC:  [facility] visit medication refills  HPI 01/18/22 [name] presents for to establish care.  I have not seen this patient since March 2020.  This patient is homeless he lives in an abandoned house in [place].  There is no electricity or heat.  He is sleeping on the floor.  Patient has severe hypertension and type 2 diabetes.  On arrival blood pressure 180/105.  He was given prescription last week for Zestoretic along with metformin and atorvastatin.  Patient's been having significant dizziness emesis epigastric abdominal pain some blood intermixed in the emesis.  He drinks about 1 beer a day.  If he stops drinking beer he feels he might go into delirium tremens.  He has a history of COPD and does have a chronic cough with some wheezing.  On arrival A1c is 9.1 blood sugar is 232.  He does not have a meter currently to measure his blood sugar.  He eats a lot of carbohydrates because he gets food from charts of food pantry's that have largely carbohydrate-based meals.  Patient does note shortness of breath with exertion.  He does not have any rectal bleeding.  He notes some numbness in the hands and in the feet.  04/05/22 This patient was seen last week at the [facility] today's visit is a posthospital visit for medication refills. Below is documentation from the last visit Seen 5/17 at [facility]: This is a transition of care visit which occurred in the [place].  This is a 56 year old male primary care patient of mine who is seen post hospital for recent left MCA stroke.  He was just discharged this week.  Below is a copy of the discharge summary   Date of Admission: 03/25/2022 Date of Discharge: 03/28/2022   Attending Physician:Jeffrey Hennie Duos, MD   Patient's ZGY:FVCBSW, Stone Harry, MD   Consults: Neurology   Disposition:  Discharge home   Follow-up Appts:   Follow-up Information       [name], MD Follow up.   Specialty: Pulmonary Disease Contact information: [address] [place] [address] [phone]              [facility]. Schedule an appointment as soon as possible for a visit in 1 month(s).   Specialty: Neurology Contact information: [address] 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  Since the initial onset it has waxed and waned in an unpredictable fashion.   Hospital Course:   Left brain hypoperfusion versus TIA due to left MCA occlusion/large vessel disease - R arm weakness -CT head without acute findings -MRI brain negative for acute/subacute CVA -mild chronic small vessel ischemic disease noted with cerebral and cerebellar atrophy -MRa head notes severe intracranial atherosclerosis with a left MCA occlusion -MRa neck negative -MRI C-spine notes cervical spine degeneration  most notable at C4-5 with right foraminal impingement and right ventral cord indentation -TTE notes EF 60-65% with normal LV function and no WMA with grade 1 DD and no intracardiac source of embolism appreciable -LDL 76 -continue Lipitor without change -A1c 8.2 Neurology recommending ASA 325 mg plus Plavix for 3 months then ASA alone, and avoidance of hypotension   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 with right foraminal impingement and right ventral cord indentation [name] - Neurology recommending outpatient EMGs/NCS -symptoms have spontaneously resolved during his hospital stay   Vitamin B12 deficiency Vitamin B12 quite low at 228 - supplemented with subcutaneous load while inpatient, and oral replacement after discharge - B12 should be rechecked in 8-12 weeks to assure he is able to absorb it orally    Hypokalemia Likely due to poor nutritional status -corrected with supplementation   DM2 A1c 8.2 -CBG well controlled as inpatient   COPD with chronic bronchitis  No acute exacerbation   Tobacco use Discussed need for absolute tobacco cessation with patient -utilized nicotine patch to assist w/ cessation    Homelessness Chronically homeless. Pt lives in a tent. TOC/CSW assisted with maximizing outpatient f/u and resources. All new meds provided to patient at bedside from [facility] prior to d/c home.    History of alcohol abuse Pt has not had any alcohol in 5 days -no evidence of withdrawal during this admission - encouraged continued abstinence    HTN Avoid overcorrection/hypotension in setting of significant intracranial vascular disease       This patient had been living in a tent in [facility] he now is in the [place] thanks to [employer] [facility] [employer].  The patient was discharged with out his insulin and also without any aspirin.  He did receive 3 transition of care pharmacy all of his  other medications.   Med list is noted below       Outpatient Encounter Medications as of 03/30/2022  Medication Sig   albuterol (VENTOLIN HFA) 108 (90 Base) MCG/ACT inhaler Inhale 2 puffs into the lungs every 6 (six) hours as needed for wheezing or shortness of breath.   amLODipine (NORVASC) 10 MG tablet Take 1 tablet (10 mg total) by mouth daily.   aspirin 325 MG tablet Take 1 tablet (325 mg total) by mouth daily.   atorvastatin (LIPITOR) 40 MG tablet Take 1 tablet (40 mg total) by mouth daily.   Blood Glucose Monitoring Suppl (TRUE METRIX METER) w/Device KIT Use as directed daily to check blood sugar   carvedilol (COREG) 25 MG tablet Take 1 tablet (25 mg total) by mouth 2 (two) times daily with a meal.   clopidogrel (PLAVIX) 75 MG tablet Take 1 tablet (75 mg total) by mouth daily.   fluticasone-salmeterol (ADVAIR HFA) 115-21 MCG/ACT inhaler Inhale 2 puffs into the lungs 2 (two) times daily.   glucose blood (TRUE METRIX BLOOD GLUCOSE TEST) test strip  Use as instructed   Insulin Glargine (BASAGLAR KWIKPEN) 100 UNIT/ML Inject 10 Units into the skin daily.   Insulin Pen Needle (UNIFINE PENTIPS) 31G X 8 MM MISC Use with insulin pen   lisinopril-hydrochlorothiazide (ZESTORETIC) 20-25 MG tablet Take 1 tablet by mouth daily.   metFORMIN (GLUCOPHAGE) 500 MG tablet Take 1 tablet (500 mg total) by mouth 2 (two) times daily with a meal. (Patient not taking: Reported on 03/25/2022)   nicotine (NICODERM CQ - DOSED IN MG/24 HOURS) 21 mg/24hr patch Place 1 patch (21 mg total) onto the skin daily.   pantoprazole (PROTONIX) 40 MG tablet Take 1 tablet (40 mg total) by mouth daily.   thiamine 100 MG tablet Take 1 tablet (100 mg total) by mouth daily.   traMADol (ULTRAM) 50 MG tablet Take 1 tablet (50 mg total) by mouth every 6 (six) hours as needed for moderate pain.   TRUEplus Lancets 28G MISC Check blood sugar daily   vitamin B-12 (CYANOCOBALAMIN) 1000 MCG tablet Take 1 tablet (1,000 mcg total) by mouth  daily.    No facility-administered encounter medications on file as of 03/30/2022.    Patient is still struggling with partial aphasia and left-sided weakness.  He was not established with any physical therapy or speech therapy post hospital as he is uninsured.   On exam blood pressure is elevated 165/81 saturation 97% pulse 86 patient demonstrates aphasia he has right-sided upper extremity weakness Chest is clear Cardiac exam unremarkable   Impression is that of recent MCA stroke you will need access to his insulin products he will need post hospital follow-up in my clinic as soon as possible we will establish this  We did give him samples of 325 mg aspirin all his other medications are accessible and have been reconciled  We will endeavor to see if we can get this patient physical therapy and speech therapy through some type of grant program as he is uninsured    Today in the clinic the patient has difficulty with memory still has trouble with ambulation using a rollator has a follow-up appointment June 6 at rehab for physical therapy and speech-language pathology.  On arrival blood pressure is elevated 150/92.  The patient has not been taking his medications recently.  He is also been skipping his metformin.  Blood sugar today is elevated.  He is only taking 10 units of Lantus insulin daily.  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  He is smoking 5 cigarettes daily at this time.  He is only on Zestoretic 20/25 daily.   8/14 Patient seen today in return follow-up on arrival blood pressure is good 136/80.  His symptoms are from stroke are improving.  He is still using a walker.  He still has some neck spasm and the cyclobenzaprine ran out.  On arrival blood sugar 289 A1c is 8.5.  He is still smoking about 1/2 pack a day of cigarettes.  He denies any cough or mucus.  He is now off Plavix after 3 months following his stroke.  He does continue with aspirin 325 mg  daily.  Patient did have visit with disability determination physician who said he stands a good chance of getting disability.  There are no other complaints other than he has trouble staying asleep.  He goes to bed early at 830 and awakens at 2:30 in the morning.  He has a hard time going back to sleep.  He does urinate frequently at night.  Past Medical History:  Diagnosis Date   Closed fracture dislocation of lumbar spine ([place]) 1987   COPD (chronic obstructive pulmonary disease) (HCC)    CVA (cerebral vascular accident) ([place]) 03/25/2022   Diabetes mellitus without complication (HCC)    GERD (gastroesophageal reflux disease)    High cholesterol    Hypertension     No past surgical history on file.  No family history on file.  Social History   Socioeconomic History   Marital status: Divorced    Spouse name: Not on file   Number of children: Not on file   Years of education: Not on file   Highest education level: Not on file  Occupational History   Not on file  Tobacco Use   Smoking status: Every Day    Packs/day: 0.50    Types: Cigarettes   Smokeless tobacco: Never  Vaping Use   Vaping Use: Never used  Substance and Sexual Activity   Alcohol use: Yes   Drug use: Not Currently   Sexual activity: Not Currently  Other Topics Concern   Not on file  Social History Narrative   Not on file   Social Determinants of Health   Financial Resource Strain: Not on file  Food Insecurity: Not on file  Transportation Needs: Not on file  Physical Activity: Not on file  Stress: Not on file  Social Connections: Not on file  Intimate Partner Violence: Not on file    ROS Review of Systems  Constitutional:  Negative for fatigue and fever.  HENT:  Negative for congestion, drooling, ear pain, facial swelling, postnasal drip, rhinorrhea, sinus pressure, sneezing, sore throat, tinnitus, trouble swallowing and voice change.   Eyes:  Negative for visual disturbance.  Respiratory:   Negative for apnea, cough, choking, chest tightness, shortness of breath, wheezing and stridor.   Cardiovascular: Negative.  Negative for chest pain, palpitations and leg swelling.  Gastrointestinal:  Negative for abdominal distention, abdominal pain, anal bleeding, blood in stool, constipation, diarrhea, nausea, rectal pain and vomiting.  Endocrine: Positive for polyuria. Negative for polydipsia and polyphagia.  Genitourinary: Negative.   Musculoskeletal:  Positive for gait problem. Negative for arthralgias and myalgias.       Hip pain  Skin: Negative.  Negative for rash.  Allergic/Immunologic: Negative.  Negative for environmental allergies and food allergies.  Neurological:  Positive for speech difficulty. Negative for dizziness, syncope, weakness, light-headedness and headaches.  Hematological: Negative.  Negative for adenopathy. Does not bruise/bleed easily.  Psychiatric/Behavioral: Negative.  Negative for agitation, decreased concentration, dysphoric mood, hallucinations, self-injury, sleep disturbance and suicidal ideas. The patient is not nervous/anxious and is not hyperactive.     Objective:   Today's Vitals: There were no vitals taken for this visit.  Physical Exam Vitals reviewed.  Constitutional:      Appearance: Normal appearance. He is well-developed. He is obese. He is not diaphoretic.  HENT:     Head: Normocephalic and atraumatic.     Nose: Nose normal. No nasal deformity, septal deviation, mucosal edema or rhinorrhea.     Right Sinus: No maxillary sinus tenderness or frontal sinus tenderness.     Left Sinus: No maxillary sinus tenderness or frontal sinus tenderness.     Mouth/Throat:     Mouth: Mucous membranes are moist.     Pharynx: Oropharynx is clear. No oropharyngeal exudate.     Comments: Poor dentition Eyes:     General: No scleral icterus.    Conjunctiva/sclera: Conjunctivae normal.     Pupils: Pupils are equal, round, and  reactive to light.  Neck:      Thyroid: No thyromegaly.     Vascular: No carotid bruit or JVD.     Trachea: Trachea normal. No tracheal tenderness or tracheal deviation.  Cardiovascular:     Rate and Rhythm: Normal rate and regular rhythm.     Chest Wall: PMI is not displaced.     Pulses: Normal pulses. No decreased pulses.     Heart sounds: Normal heart sounds, S1 normal and S2 normal. Heart sounds not distant. No murmur heard.    No systolic murmur is present.     No diastolic murmur is present.     No friction rub. No gallop. No S3 or S4 sounds.  Pulmonary:     Effort: Pulmonary effort is normal. No tachypnea, accessory muscle usage or respiratory distress.     Breath sounds: Normal breath sounds. No stridor. No decreased breath sounds, wheezing, rhonchi or rales.  Chest:     Chest wall: No tenderness.  Abdominal:     General: Bowel sounds are normal. There is no distension.     Palpations: Abdomen is soft. Abdomen is not rigid. There is no mass.     Tenderness: There is no abdominal tenderness. There is no right CVA tenderness, left CVA tenderness, guarding or rebound.     Hernia: No hernia is present.  Musculoskeletal:        General: Normal range of motion.     Cervical back: Normal range of motion and neck supple. No edema, erythema or rigidity. No muscular tenderness. Normal range of motion.  Lymphadenopathy:     Head:     Right side of head: No submental or submandibular adenopathy.     Left side of head: No submental or submandibular adenopathy.     Cervical: No cervical adenopathy.  Skin:    General: Skin is warm and dry.     Coloration: Skin is not pale.     Findings: No rash.     Nails: There is no clubbing.  Neurological:     Mental Status: He is alert and oriented to person, place, and time.     Cranial Nerves: Cranial nerves 2-12 are intact.     Sensory: Sensation is intact. No sensory deficit.     Motor: No weakness, tremor, atrophy, abnormal muscle tone, seizure activity or pronator drift.      Coordination: Coordination is intact.     Gait: Gait abnormal and tandem walk abnormal.  Psychiatric:        Speech: Speech normal.        Behavior: Behavior normal.     Assessment & Plan:   Problem List Items Addressed This Visit   None Outpatient Encounter Medications as of 06/27/2022  Medication Sig   albuterol (VENTOLIN HFA) 108 (90 Base) MCG/ACT inhaler Inhale 2 puffs into the lungs every 6 (six) hours as needed for wheezing or shortness of breath.   amLODipine (NORVASC) 10 MG tablet Take 1 tablet (10 mg total) by mouth daily.   aspirin 325 MG tablet Take 1 tablet (325 mg total) by mouth daily.   atorvastatin (LIPITOR) 40 MG tablet Take 1 tablet (40 mg total) by mouth daily.   Blood Glucose Monitoring Suppl (TRUE METRIX METER) w/Device KIT Use as directed daily to check blood sugar   carvedilol (COREG) 25 MG tablet Take 1 tablet (25 mg total) by mouth 2 (two) times daily with a meal.   clopidogrel (PLAVIX) 75 MG tablet Take 1 tablet (  75 mg total) by mouth once daily.   cyanocobalamin (VITAMIN B12) 1000 MCG tablet Take 1 tablet (1,000 mcg total) by mouth once daily.   cyclobenzaprine (FLEXERIL) 10 MG tablet Take 1 tablet (10 mg total) by mouth 3 (three) times daily as needed for muscle spasms.   fluticasone-salmeterol (ADVAIR DISKUS) 100-50 MCG/ACT AEPB Inhale 1 puff into the lungs 2 (two) times daily.   glucose blood (TRUE METRIX BLOOD GLUCOSE TEST) test strip Use as instructed   Insulin Glargine (BASAGLAR KWIKPEN) 100 UNIT/ML Inject 15 Units into the skin daily.   Insulin Pen Needle (UNIFINE PENTIPS) 31G X 8 MM MISC Use with insulin pen   meloxicam (MOBIC) 15 MG tablet Take 1 tablet (15 mg total) by mouth daily.   metFORMIN (GLUCOPHAGE) 500 MG tablet Take 2 tablets (1,000 mg total) by mouth daily with breakfast.   nicotine (NICODERM CQ - DOSED IN MG/24 HOURS) 21 mg/24hr patch Place 1 patch (21 mg total) onto the skin daily.   pantoprazole (PROTONIX) 40 MG tablet Take 1 tablet  (40 mg total) by mouth once daily.   polyethylene glycol powder (GLYCOLAX/MIRALAX) 17 GM/SCOOP powder Take 17 g by mouth 2 (two) times daily as needed.   thiamine (VITAMIN B1) 100 MG tablet Take 1 tablet (100 mg total) by mouth once daily.   TRUEplus Lancets 28G MISC Check blood sugar daily   valsartan-hydrochlorothiazide (DIOVAN-HCT) 320-25 MG tablet Take 1 tablet by mouth once daily.   No facility-administered encounter medications on file as of 06/27/2022.  Need referral to neurology for post hospital follow-up Keep physical therapy speech therapy follow-up June 6 at 10:30 AM  45 minutes spent with assessment following up old records Follow-up: No follow-ups on file.   Asencion Noble, MD

## 2022-06-27 ENCOUNTER — Ambulatory Visit: Payer: Medicaid Other | Attending: Critical Care Medicine | Admitting: Critical Care Medicine

## 2022-06-27 ENCOUNTER — Encounter: Payer: Self-pay | Admitting: Critical Care Medicine

## 2022-06-27 ENCOUNTER — Other Ambulatory Visit (HOSPITAL_COMMUNITY): Payer: Self-pay

## 2022-06-27 ENCOUNTER — Other Ambulatory Visit: Payer: Self-pay

## 2022-06-27 VITALS — BP 136/80 | HR 85 | Ht 65.0 in | Wt 181.6 lb

## 2022-06-27 DIAGNOSIS — Z794 Long term (current) use of insulin: Secondary | ICD-10-CM

## 2022-06-27 DIAGNOSIS — E785 Hyperlipidemia, unspecified: Secondary | ICD-10-CM

## 2022-06-27 DIAGNOSIS — J4489 Other specified chronic obstructive pulmonary disease: Secondary | ICD-10-CM

## 2022-06-27 DIAGNOSIS — Z720 Tobacco use: Secondary | ICD-10-CM

## 2022-06-27 DIAGNOSIS — I10 Essential (primary) hypertension: Secondary | ICD-10-CM

## 2022-06-27 DIAGNOSIS — E1169 Type 2 diabetes mellitus with other specified complication: Secondary | ICD-10-CM | POA: Diagnosis not present

## 2022-06-27 DIAGNOSIS — J449 Chronic obstructive pulmonary disease, unspecified: Secondary | ICD-10-CM

## 2022-06-27 DIAGNOSIS — E1165 Type 2 diabetes mellitus with hyperglycemia: Secondary | ICD-10-CM

## 2022-06-27 DIAGNOSIS — Z8673 Personal history of transient ischemic attack (TIA), and cerebral infarction without residual deficits: Secondary | ICD-10-CM | POA: Diagnosis not present

## 2022-06-27 LAB — POCT GLYCOSYLATED HEMOGLOBIN (HGB A1C): HbA1c, POC (controlled diabetic range): 8.5 % — AB (ref 0.0–7.0)

## 2022-06-27 LAB — GLUCOSE, POCT (MANUAL RESULT ENTRY): POC Glucose: 289 mg/dl — AB (ref 70–99)

## 2022-06-27 MED ORDER — DAPAGLIFLOZIN PROPANEDIOL 10 MG PO TABS
10.0000 mg | ORAL_TABLET | Freq: Every day | ORAL | 2 refills | Status: DC
  Filled 2022-06-27: qty 30, 30d supply, fill #0
  Filled 2022-07-29 – 2022-08-03 (×2): qty 30, 30d supply, fill #1

## 2022-06-27 MED ORDER — CYCLOBENZAPRINE HCL 10 MG PO TABS
10.0000 mg | ORAL_TABLET | Freq: Three times a day (TID) | ORAL | 0 refills | Status: DC | PRN
  Filled 2022-06-27 (×2): qty 90, 30d supply, fill #0

## 2022-06-27 MED ORDER — BASAGLAR KWIKPEN 100 UNIT/ML ~~LOC~~ SOPN
20.0000 [IU] | PEN_INJECTOR | Freq: Every day | SUBCUTANEOUS | 2 refills | Status: DC
  Filled 2022-06-27: qty 6, 30d supply, fill #0
  Filled 2022-06-27: qty 3, 15d supply, fill #0

## 2022-06-27 NOTE — Assessment & Plan Note (Signed)
Hyperlipidemia now on atorvastatin follow-up lipid panel

## 2022-06-27 NOTE — Patient Instructions (Signed)
Increase insulin glargine to 20 units daily refill sent to [facility] outpatient pharmacy and the nurse will bring this to Wednesday or Thursday  Begin Farxiga 1 pill daily for diabetes, go to the pharmacy downstairs and sign an application for the [place] that the medicine was sent downstairs to our pharmacy there so would be free with patient assistance  Cyclobenzaprine will be refilled and sent to [facility] outpatient pharmacy  No change in any of your other medications   Continue to reduce tobacco intake  Labs today include cholesterol panel blood counts metabolic panel  Return to see Dr. Delford Field [facility] and will continue to follow you at the shelter

## 2022-06-27 NOTE — Assessment & Plan Note (Signed)
Continue Advair encouraged to quit smoking

## 2022-06-27 NOTE — Assessment & Plan Note (Signed)
Blood pressure at goal continue current medications to include amlodipine valsartan HCT and carvedilol and check CBC and metabolic panel this visit

## 2022-06-27 NOTE — Assessment & Plan Note (Signed)
Type 2 diabetes not well controlled will increase insulin glargine to 20 units daily continue metformin at 1000 mg daily and begin Farxiga 10 mg daily  Patient received patient assistance program with [place]  Check lipids and metabolic panel this visit

## 2022-06-27 NOTE — Assessment & Plan Note (Signed)
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

## 2022-06-27 NOTE — Assessment & Plan Note (Signed)
  .   Current smoking consumption amount:  10 cigarettes a day  Dicsussion on advise to quit smoking and smoking impacts: Lung cardiovascular impacts  Patient's willingness to quit: Willing to quit  Methods to quit smoking discussed: Behavioral modification  Medication management of smoking session drugs discussed: Nicotine replacement  Resources provided:  AVS   Setting quit date not established  Follow-up arranged 4 months   Time spent counseling the patient: 5 minutes  

## 2022-06-28 LAB — CBC WITH DIFFERENTIAL/PLATELET
Basophils Absolute: 0.1 10*3/uL (ref 0.0–0.2)
Basos: 1 %
EOS (ABSOLUTE): 0.3 10*3/uL (ref 0.0–0.4)
Eos: 3 %
Hematocrit: 45.8 % (ref 37.5–51.0)
Hemoglobin: 15.4 g/dL (ref 13.0–17.7)
Immature Grans (Abs): 0.1 10*3/uL (ref 0.0–0.1)
Immature Granulocytes: 1 %
Lymphocytes Absolute: 2.3 10*3/uL (ref 0.7–3.1)
Lymphs: 23 %
MCH: 29.8 pg (ref 26.6–33.0)
MCHC: 33.6 g/dL (ref 31.5–35.7)
MCV: 89 fL (ref 79–97)
Monocytes Absolute: 0.8 10*3/uL (ref 0.1–0.9)
Monocytes: 8 %
Neutrophils Absolute: 6.5 10*3/uL (ref 1.4–7.0)
Neutrophils: 64 %
Platelets: 243 10*3/uL (ref 150–450)
RBC: 5.17 x10E6/uL (ref 4.14–5.80)
RDW: 12.7 % (ref 11.6–15.4)
WBC: 10 10*3/uL (ref 3.4–10.8)

## 2022-06-28 LAB — COMPREHENSIVE METABOLIC PANEL
ALT: 37 IU/L (ref 0–44)
AST: 19 IU/L (ref 0–40)
Albumin/Globulin Ratio: 1.7 (ref 1.2–2.2)
Albumin: 4.5 g/dL (ref 3.8–4.9)
Alkaline Phosphatase: 106 IU/L (ref 44–121)
BUN/Creatinine Ratio: 18 (ref 9–20)
BUN: 24 mg/dL (ref 6–24)
Bilirubin Total: 0.4 mg/dL (ref 0.0–1.2)
CO2: 28 mmol/L (ref 20–29)
Calcium: 10.2 mg/dL (ref 8.7–10.2)
Chloride: 98 mmol/L (ref 96–106)
Creatinine, Ser: 1.31 mg/dL — ABNORMAL HIGH (ref 0.76–1.27)
Globulin, Total: 2.7 g/dL (ref 1.5–4.5)
Glucose: 247 mg/dL — ABNORMAL HIGH (ref 70–99)
Potassium: 4.4 mmol/L (ref 3.5–5.2)
Sodium: 141 mmol/L (ref 134–144)
Total Protein: 7.2 g/dL (ref 6.0–8.5)
eGFR: 64 mL/min/{1.73_m2} (ref >59)

## 2022-06-28 LAB — LIPID PANEL
Chol/HDL Ratio: 4.1 ratio (ref 0.0–5.0)
Cholesterol, Total: 159 mg/dL (ref 100–199)
HDL: 39 mg/dL — ABNORMAL LOW (ref >39)
LDL Chol Calc (NIH): 69 mg/dL (ref 0–99)
Triglycerides: 319 mg/dL — ABNORMAL HIGH (ref 0–149)
VLDL Cholesterol Cal: 51 mg/dL — ABNORMAL HIGH (ref 5–40)

## 2022-06-30 ENCOUNTER — Encounter: Payer: Self-pay | Admitting: *Deleted

## 2022-06-30 ENCOUNTER — Other Ambulatory Visit: Payer: Self-pay | Admitting: *Deleted

## 2022-06-30 ENCOUNTER — Other Ambulatory Visit (HOSPITAL_COMMUNITY): Payer: Self-pay

## 2022-06-30 MED ORDER — CARVEDILOL 25 MG PO TABS
25.0000 mg | ORAL_TABLET | Freq: Two times a day (BID) | ORAL | 3 refills | Status: DC
  Filled 2022-06-30: qty 60, 30d supply, fill #0
  Filled 2022-07-07: qty 180, 90d supply, fill #0

## 2022-06-30 MED ORDER — ATORVASTATIN CALCIUM 40 MG PO TABS
40.0000 mg | ORAL_TABLET | Freq: Every day | ORAL | 1 refills | Status: DC
  Filled 2022-06-30: qty 30, 30d supply, fill #0
  Filled 2022-07-07: qty 60, 60d supply, fill #0

## 2022-06-30 NOTE — Congregational Nurse Program (Signed)
Filled pillbox, also assisted disability form and called atty. With pt

## 2022-07-06 ENCOUNTER — Encounter: Payer: Self-pay | Admitting: Physician Assistant

## 2022-07-06 ENCOUNTER — Other Ambulatory Visit: Payer: Self-pay | Admitting: Critical Care Medicine

## 2022-07-06 ENCOUNTER — Other Ambulatory Visit: Payer: Self-pay

## 2022-07-06 MED ORDER — TRULICITY 0.75 MG/0.5ML ~~LOC~~ SOAJ
0.7500 mg | SUBCUTANEOUS | 4 refills | Status: DC
  Filled 2022-07-06: qty 2, 28d supply, fill #0
  Filled 2022-08-03: qty 2, 28d supply, fill #1

## 2022-07-06 NOTE — Progress Notes (Signed)
Pt seen by Dr Delford Field.  Blood sugar this am was > 190 and this pm was 257. He has been eating fruit, cannot eat the fish. He ate the slaw for lunch.   He is compliant w/ insulin Glargine 20 u/day, Farxiga and metformin.  Trulicity added, pt assistance will be filled out. He will got to pick it up tomorrow.  He is on ASA 325 mg qd, Neuro said he could stop the Plavix.   He is only taking Flexeril at night, tries not to take it during the day.   He is otherwise doing ok.  [name], PA-C 07/06/2022 4:17 PM

## 2022-07-07 ENCOUNTER — Other Ambulatory Visit: Payer: Self-pay

## 2022-07-07 ENCOUNTER — Other Ambulatory Visit: Payer: Self-pay | Admitting: *Deleted

## 2022-07-07 ENCOUNTER — Other Ambulatory Visit (HOSPITAL_COMMUNITY): Payer: Self-pay

## 2022-07-13 ENCOUNTER — Other Ambulatory Visit: Payer: Self-pay

## 2022-07-19 ENCOUNTER — Encounter (HOSPITAL_COMMUNITY): Payer: Self-pay | Admitting: Emergency Medicine

## 2022-07-19 ENCOUNTER — Emergency Department (HOSPITAL_COMMUNITY): Payer: Medicaid Other

## 2022-07-19 ENCOUNTER — Encounter: Payer: Self-pay | Admitting: *Deleted

## 2022-07-19 ENCOUNTER — Observation Stay (HOSPITAL_COMMUNITY)
Admission: EM | Admit: 2022-07-19 | Discharge: 2022-07-27 | Disposition: A | Discharge status: Home / Self Care | Payer: Medicaid Other | Attending: Student | Admitting: Student

## 2022-07-19 DIAGNOSIS — E119 Type 2 diabetes mellitus without complications: Secondary | ICD-10-CM

## 2022-07-19 DIAGNOSIS — K529 Noninfective gastroenteritis and colitis, unspecified: Secondary | ICD-10-CM | POA: Diagnosis not present

## 2022-07-19 DIAGNOSIS — Z7984 Long term (current) use of oral hypoglycemic drugs: Secondary | ICD-10-CM | POA: Insufficient documentation

## 2022-07-19 DIAGNOSIS — Z7982 Long term (current) use of aspirin: Secondary | ICD-10-CM | POA: Diagnosis not present

## 2022-07-19 DIAGNOSIS — Z5900 Homelessness unspecified: Secondary | ICD-10-CM

## 2022-07-19 DIAGNOSIS — U071 COVID-19: Secondary | ICD-10-CM | POA: Diagnosis not present

## 2022-07-19 DIAGNOSIS — E1169 Type 2 diabetes mellitus with other specified complication: Secondary | ICD-10-CM | POA: Insufficient documentation

## 2022-07-19 DIAGNOSIS — Z8673 Personal history of transient ischemic attack (TIA), and cerebral infarction without residual deficits: Secondary | ICD-10-CM | POA: Diagnosis not present

## 2022-07-19 DIAGNOSIS — E785 Hyperlipidemia, unspecified: Secondary | ICD-10-CM | POA: Insufficient documentation

## 2022-07-19 DIAGNOSIS — Z794 Long term (current) use of insulin: Secondary | ICD-10-CM | POA: Diagnosis not present

## 2022-07-19 DIAGNOSIS — F17210 Nicotine dependence, cigarettes, uncomplicated: Secondary | ICD-10-CM | POA: Insufficient documentation

## 2022-07-19 DIAGNOSIS — Z79899 Other long term (current) drug therapy: Secondary | ICD-10-CM | POA: Insufficient documentation

## 2022-07-19 DIAGNOSIS — Z7985 Long-term (current) use of injectable non-insulin antidiabetic drugs: Secondary | ICD-10-CM | POA: Diagnosis not present

## 2022-07-19 DIAGNOSIS — E1165 Type 2 diabetes mellitus with hyperglycemia: Secondary | ICD-10-CM

## 2022-07-19 DIAGNOSIS — Z720 Tobacco use: Secondary | ICD-10-CM | POA: Diagnosis present

## 2022-07-19 DIAGNOSIS — N179 Acute kidney failure, unspecified: Principal | ICD-10-CM | POA: Insufficient documentation

## 2022-07-19 DIAGNOSIS — R112 Nausea with vomiting, unspecified: Secondary | ICD-10-CM | POA: Diagnosis present

## 2022-07-19 DIAGNOSIS — I10 Essential (primary) hypertension: Secondary | ICD-10-CM | POA: Diagnosis present

## 2022-07-19 LAB — CBC WITH DIFFERENTIAL/PLATELET
Abs Immature Granulocytes: 0.03 10*3/uL (ref 0.00–0.07)
Basophils Absolute: 0 10*3/uL (ref 0.0–0.1)
Basophils Relative: 0 %
Eosinophils Absolute: 0.2 10*3/uL (ref 0.0–0.5)
Eosinophils Relative: 2 %
HCT: 43.4 % (ref 39.0–52.0)
Hemoglobin: 14.9 g/dL (ref 13.0–17.0)
Immature Granulocytes: 0 %
Lymphocytes Relative: 27 %
Lymphs Abs: 2.3 10*3/uL (ref 0.7–4.0)
MCH: 30 pg (ref 26.0–34.0)
MCHC: 34.3 g/dL (ref 30.0–36.0)
MCV: 87.5 fL (ref 80.0–100.0)
Monocytes Absolute: 0.6 10*3/uL (ref 0.1–1.0)
Monocytes Relative: 7 %
Neutro Abs: 5.6 10*3/uL (ref 1.7–7.7)
Neutrophils Relative %: 64 %
Platelets: 272 10*3/uL (ref 150–400)
RBC: 4.96 MIL/uL (ref 4.22–5.81)
RDW: 13.2 % (ref 11.5–15.5)
WBC: 8.7 10*3/uL (ref 4.0–10.5)
nRBC: 0 % (ref 0.0–0.2)

## 2022-07-19 LAB — MAGNESIUM: Magnesium: 2.4 mg/dL (ref 1.7–2.4)

## 2022-07-19 LAB — URINALYSIS, ROUTINE W REFLEX MICROSCOPIC
Bacteria, UA: NONE SEEN
Bilirubin Urine: NEGATIVE
Glucose, UA: >=500 mg/dL — AB
Ketones, ur: NEGATIVE mg/dL
Leukocytes,Ua: NEGATIVE
Nitrite: NEGATIVE
Protein, ur: NEGATIVE mg/dL
Specific Gravity, Urine: 1.008 (ref 1.005–1.030)
pH: 5 (ref 5.0–8.0)

## 2022-07-19 LAB — COMPREHENSIVE METABOLIC PANEL
ALT: 29 U/L (ref 0–44)
AST: 24 U/L (ref 15–41)
Albumin: 3.8 g/dL (ref 3.5–5.0)
Alkaline Phosphatase: 83 U/L (ref 38–126)
Anion gap: 10 (ref 5–15)
BUN: 37 mg/dL — ABNORMAL HIGH (ref 6–20)
CO2: 27 mmol/L (ref 22–32)
Calcium: 9.3 mg/dL (ref 8.9–10.3)
Chloride: 97 mmol/L — ABNORMAL LOW (ref 98–111)
Creatinine, Ser: 2 mg/dL — ABNORMAL HIGH (ref 0.61–1.24)
GFR, Estimated: 38 mL/min — ABNORMAL LOW (ref >60)
Glucose, Bld: 256 mg/dL — ABNORMAL HIGH (ref 70–99)
Potassium: 3.2 mmol/L — ABNORMAL LOW (ref 3.5–5.1)
Sodium: 134 mmol/L — ABNORMAL LOW (ref 135–145)
Total Bilirubin: 0.7 mg/dL (ref 0.3–1.2)
Total Protein: 7 g/dL (ref 6.5–8.1)

## 2022-07-19 LAB — SARS CORONAVIRUS 2 BY RT PCR: SARS Coronavirus 2 by RT PCR: POSITIVE — AB

## 2022-07-19 LAB — CK: Total CK: 114 U/L (ref 49–397)

## 2022-07-19 LAB — LIPASE, BLOOD: Lipase: 38 U/L (ref 11–51)

## 2022-07-19 MED ORDER — LACTATED RINGERS IV BOLUS
1000.0000 mL | Freq: Once | INTRAVENOUS | Status: AC
  Administered 2022-07-19: 1000 mL via INTRAVENOUS

## 2022-07-19 MED ORDER — POTASSIUM CHLORIDE CRYS ER 20 MEQ PO TBCR
40.0000 meq | EXTENDED_RELEASE_TABLET | Freq: Once | ORAL | Status: AC
  Administered 2022-07-19: 40 meq via ORAL
  Filled 2022-07-19: qty 2

## 2022-07-19 NOTE — ED Notes (Addendum)
Called lab regarding adding on CK to previous drawn labs. Per lab, they will add on.

## 2022-07-19 NOTE — ED Provider Notes (Signed)
[facility] EMERGENCY DEPARTMENT Provider Note   CSN: [id number] Arrival date & time: 07/19/22  1344     History  Chief Complaint  Patient presents with   Emesis    Stanley Stone is a 56 y.o. male.  HPI 56 year old male presents with vomiting and diarrhea. Ongoing for 3 days. No hematochezia. No fevers. He's also been having 3 days of abdominal pain. Diarrhea seems to have stopped. Now just has nausea instead of vomiting. Feels generally weak. His left lower ribs are sore from vomiting. No chest pain/dyspnea otherwise.  Home Medications Prior to Admission medications   Medication Sig Start Date End Date Taking? Authorizing Provider  albuterol (VENTOLIN HFA) 108 (90 Base) MCG/ACT inhaler Inhale 2 puffs into the lungs every 6 (six) hours as needed for wheezing or shortness of breath. 01/18/22   [name], MD  amLODipine (NORVASC) 10 MG tablet Take 1 tablet (10 mg total) by mouth daily. 04/05/22   [name], MD  aspirin 325 MG tablet Take 1 tablet (325 mg total) by mouth daily. 03/29/22   Cherene Altes, MD  atorvastatin (LIPITOR) 40 MG tablet Take 1 tablet (40 mg total) by mouth daily. 06/30/22   [name], MD  Blood Glucose Monitoring Suppl (TRUE METRIX METER) w/Device KIT Use as directed daily to check blood sugar 01/18/22   [name], MD  carvedilol (COREG) 25 MG tablet Take 1 tablet (25 mg total) by mouth 2 (two) times daily with a meal. 06/30/22   [name], MD  cyanocobalamin (VITAMIN B12) 1000 MCG tablet Take 1 tablet (1,000 mcg total) by mouth once daily. 06/16/22   [name], MD  cyclobenzaprine (FLEXERIL) 10 MG tablet Take 1 tablet (10 mg total) by mouth 3 (three) times daily as needed for muscle spasms. 06/27/22   [name], MD  dapagliflozin propanediol (FARXIGA) 10 MG TABS tablet Take 1 tablet (10 mg total) by mouth daily before breakfast. 06/27/22   [name], MD  Dulaglutide (TRULICITY) 3.61  WE/3.1VQ SOPN Inject 0.75 mg into the skin once a week. 07/06/22   [name], MD  fluticasone-salmeterol (ADVAIR DISKUS) 100-50 MCG/ACT AEPB Inhale 1 puff into the lungs 2 (two) times daily. 04/27/22   [name], MD  glucose blood (TRUE METRIX BLOOD GLUCOSE TEST) test strip Use as instructed 03/17/22   [name], MD  Insulin Glargine [facility]) 100 UNIT/ML Inject 20 Units into the skin daily. 06/27/22   [name], MD  Insulin Pen Needle (UNIFINE PENTIPS) 31G X 8 MM MISC Use with insulin pen 02/14/22   [name], MD  meloxicam (MOBIC) 15 MG tablet Take 1 tablet (15 mg total) by mouth daily. 04/27/22   [name], MD  metFORMIN (GLUCOPHAGE) 500 MG tablet Take 2 tablets (1,000 mg total) by mouth daily with breakfast. 04/05/22   [name], MD  nicotine (NICODERM CQ - DOSED IN MG/24 HOURS) 21 mg/24hr patch Place 1 patch (21 mg total) onto the skin daily. 03/29/22   Cherene Altes, MD  pantoprazole (PROTONIX) 40 MG tablet Take 1 tablet (40 mg total) by mouth once daily. 06/16/22   [name], MD  polyethylene glycol powder (GLYCOLAX/MIRALAX) 17 GM/SCOOP powder Take 17 g by mouth 2 (two) times daily as needed. 03/31/22   [name], MD  thiamine (VITAMIN B1) 100 MG tablet Take 1 tablet (100 mg total) by mouth once daily.  06/16/22   [name], MD  TRUEplus Lancets 28G MISC Check blood sugar daily 03/17/22   [name], MD  valsartan-hydrochlorothiazide (DIOVAN-HCT) 320-25 MG tablet Take 1 tablet by mouth once daily. 06/16/22   [name], MD      Allergies    Penicillins    Review of Systems   Review of Systems  Constitutional:  Negative for fever.  Respiratory:  Negative for shortness of breath.   Cardiovascular:  Positive for chest pain (left ribs).  Gastrointestinal:  Positive for abdominal pain, diarrhea, nausea and vomiting. Negative for blood in stool.  Neurological:  Positive for weakness.    Physical  Exam Updated Vital Signs BP 112/66 (BP Location: Right Arm)   Pulse 83   Temp 98.3 F (36.8 C) (Oral)   Resp 17   Ht $R'5\' 5"'OK$  (1.651 m)   Wt 82 kg   SpO2 99%   BMI 30.08 kg/m  Physical Exam Vitals and nursing note reviewed.  Constitutional:      General: He is not in acute distress.    Appearance: He is well-developed. He is not ill-appearing or diaphoretic.  HENT:     Head: Normocephalic and atraumatic.  Cardiovascular:     Rate and Rhythm: Normal rate and regular rhythm.     Heart sounds: Normal heart sounds.  Pulmonary:     Effort: Pulmonary effort is normal.     Breath sounds: Normal breath sounds.  Chest:     Chest wall: Tenderness (left lower anterior ribs) present.  Abdominal:     Palpations: Abdomen is soft.     Tenderness: There is generalized abdominal tenderness.  Skin:    General: Skin is warm and dry.  Neurological:     Mental Status: He is alert.     ED Results / Procedures / Treatments   Labs (all labs ordered are listed, but only abnormal results are displayed) Labs Reviewed  COMPREHENSIVE METABOLIC PANEL - Abnormal; Notable for the following components:      Result Value   Sodium 134 (*)    Potassium 3.2 (*)    Chloride 97 (*)    Glucose, Bld 256 (*)    BUN 37 (*)    Creatinine, Ser 2.00 (*)    GFR, Estimated 38 (*)    All other components within normal limits  URINALYSIS, ROUTINE W REFLEX MICROSCOPIC - Abnormal; Notable for the following components:   Color, Urine STRAW (*)    Glucose, UA >=500 (*)    Hgb urine dipstick MODERATE (*)    All other components within normal limits  SARS CORONAVIRUS 2 BY RT PCR  CBC WITH DIFFERENTIAL/PLATELET  LIPASE, BLOOD  MAGNESIUM  CK    EKG EKG Interpretation  Date/Time:  Tuesday July 19 2022 22:16:03 EDT Ventricular Rate:  74 PR Interval:  197 QRS Duration: 114 QT Interval:  403 QTC Calculation: 448 R Axis:   86 Text Interpretation: Sinus rhythm Borderline intraventricular conduction delay  ST changes similar to May 2023 Confirmed by Sherwood Gambler [phone]) on 07/19/2022 10:17:32 PM  Radiology CT ABDOMEN PELVIS WO CONTRAST  Result Date: 07/19/2022 CLINICAL DATA:  Nausea, vomiting, abdominal pain EXAM: CT ABDOMEN AND PELVIS WITHOUT CONTRAST TECHNIQUE: Multidetector CT imaging of the abdomen and pelvis was performed following the standard protocol without IV contrast. RADIATION DOSE REDUCTION: This exam was performed according to the departmental dose-optimization program which includes automated exposure control, adjustment of the mA and/or kV according to patient size and/or use of  iterative reconstruction technique. COMPARISON:  05/05/2018 FINDINGS: Lower chest: Benign calcified granuloma within the visualized left lung base. Lung bases are otherwise clear. Visualized heart and pericardium are unremarkable. Hepatobiliary: No focal liver abnormality is seen. No gallstones, gallbladder wall thickening, or biliary dilatation. Pancreas: Unremarkable Spleen: Unremarkable Adrenals/Urinary Tract: 4.0 x 2.3 cm mass within the left adrenal gland measures 0 Hounsfield units in density and is compatible with a benign adrenal adenoma. This is unchanged from prior examination. Thickening of the right adrenal gland is unchanged. The kidneys are unremarkable. The bladder is unremarkable. Stomach/Bowel: Stomach is within normal limits. Appendix appears normal. No evidence of bowel wall thickening, distention, or inflammatory changes. Vascular/Lymphatic: Mild aortoiliac atherosclerotic calcification. No aortic aneurysm. Circumaortic left renal vein. No pathologic adenopathy within the abdomen and pelvis. Reproductive: Prostate is unremarkable. Other: No abdominal wall hernia or abnormality. No abdominopelvic ascites. Musculoskeletal: Stable L2 compression deformity with mild loss of height and no retropulsion. No acute bone abnormality. No lytic or blastic bone lesion. IMPRESSION: 1. No acute intra-abdominal  pathology identified. No definite radiographic explanation for the patient's reported symptoms. 2. Stable 4 cm left adrenal adenoma. Given its size, surgical consultation is recommended for further management. If there are clinical signs or symptoms of adrenal hyperfunction, biochemical evaluation may be appropriate. Aortic Atherosclerosis (ICD10-I70.0). Electronically Signed   By: Helyn Numbers M.D.   On: 07/19/2022 23:26   DG Chest Portable 1 View  Result Date: 07/19/2022 CLINICAL DATA:  Chest pain. EXAM: PORTABLE CHEST 1 VIEW COMPARISON:  April 16, 2021. FINDINGS: The heart size and mediastinal contours are within normal limits. Stable calcified granuloma seen in left lower lobe. No acute pulmonary disease is noted. The visualized skeletal structures are unremarkable. IMPRESSION: No active disease. Electronically Signed   By: Lupita Raider M.D.   On: 07/19/2022 21:21    Procedures Procedures    Medications Ordered in ED Medications  lactated ringers bolus 1,000 mL (1,000 mLs Intravenous New Bag/Given 07/19/22 2256)  lactated ringers bolus 1,000 mL (1,000 mLs Intravenous New Bag/Given 07/19/22 2255)  potassium chloride SA (KLOR-CON M) CR tablet 40 mEq (40 mEq Oral Given 07/19/22 2210)    ED Course/ Medical Decision Making/ A&P                           Medical Decision Making Amount and/or Complexity of Data Reviewed Labs: ordered. Radiology: ordered.  Risk Prescription drug management.   Patient is found to have acute kidney injury with a creatinine of 2.0 which is increased from his baseline.  He also has BUN elevation.  Sounds like prerenal losses based on vomiting and diarrhea.  He has some diffuse abdominal pain so CT was obtained.  I personally viewed/interpreted these images.  No obstruction.  However it is also noted that he has a adrenal adenoma and will probably need surgical consultation at some point though I doubt this is causing his symptoms today.  He is currently getting  fluids but still feels quite weak.  Given this, I do not think he is appropriate for discharge and will probably need fluids overnight and admission to the hospitalist service.  Discussed with Dr. Imogene Burn.        Final Clinical Impression(s) / ED Diagnoses Final diagnoses:  Acute kidney injury [facility])    Rx / DC Orders ED Discharge Orders     None         Pricilla Loveless, MD 07/19/22 2344

## 2022-07-19 NOTE — Congregational Nurse Program (Signed)
Pt c/o n&v, diarrhea for 3 days . States "I feel worse than when I had my stroke" CBG 137 Pt is with nurse, he drank some pedialyte. Spoke w/ dr Delford Field he states send pt to ED [name] PA arrives. EMS called  Pt having LUQ abd pain w/ tenderness and guarding. Pain started about the time the GI sx started. Hurts to take a deep breath.  The diarrhea has been very light brown to yellowish,  non-bloody, watery. Has been able to keep a little clear liquids down, but no food.    He vehemently denies ETOH/drug abuse.  HR is 98, he is light-headed and dizzy sitting up.   [name], PAC and Marin Roberts, RN 07/19/2022 at 12:49 pm

## 2022-07-19 NOTE — ED Provider Triage Note (Signed)
Emergency Medicine Provider Triage Evaluation Note  Stanley Stone , a 56 y.o. male  was evaluated in triage.  Pt complains of nausea, vomiting, diarrhea and abdominal pain.  Symptoms began 3 days ago.  Denies any bloody stools.  No urinary symptoms.  No known sick contacts.  No fevers or chills.  He is taken no medications for symptoms prior to arrival.  Review of Systems  Positive: Nausea, vomiting, diarrhea, abdominal pain Negative: Fever, chills, chest pain, shortness of breath, bloody stools  Physical Exam  BP 109/72   Pulse 88   Temp 97.7 F (36.5 C)   Resp 15   Ht 5\' 5"  (1.651 m)   Wt 82 kg   SpO2 100%   BMI 30.08 kg/m  Gen:   Awake, no distress   Resp:  Normal effort  MSK:   Moves extremities without difficulty  Other:  Bowel sounds are present.  Patient with generalized abdominal tenderness but no signs of peritonitis.  Patient is well-hydrated on exam.  Medical Decision Making  Medically screening exam initiated at 2:18 PM.  Appropriate orders placed.  Stanley Stone was informed that the remainder of the evaluation will be completed by another provider, this initial triage assessment does not replace that evaluation, and the importance of remaining in the ED until their evaluation is complete.  Patient presents for 3 days of nausea, vomiting, diarrhea and abdominal pain.  Patient is overall well-appearing on exam.  No signs of peritonitis or focal abdominal pain to palpation.  Will need to obtain lab work.  We will hold imaging at this time.   Nigel Berthold, PA-C 07/19/22 1420

## 2022-07-19 NOTE — ED Triage Notes (Signed)
Pt arrives via EMS from homeless shelter with n/v/d for 3 days. Endorses abd pain.

## 2022-07-20 ENCOUNTER — Other Ambulatory Visit: Payer: Self-pay

## 2022-07-20 ENCOUNTER — Encounter (HOSPITAL_COMMUNITY): Payer: Self-pay | Admitting: Internal Medicine

## 2022-07-20 DIAGNOSIS — U071 COVID-19: Secondary | ICD-10-CM

## 2022-07-20 DIAGNOSIS — E785 Hyperlipidemia, unspecified: Secondary | ICD-10-CM

## 2022-07-20 DIAGNOSIS — Z8673 Personal history of transient ischemic attack (TIA), and cerebral infarction without residual deficits: Secondary | ICD-10-CM

## 2022-07-20 DIAGNOSIS — N179 Acute kidney failure, unspecified: Secondary | ICD-10-CM

## 2022-07-20 DIAGNOSIS — Z794 Long term (current) use of insulin: Secondary | ICD-10-CM

## 2022-07-20 DIAGNOSIS — E1169 Type 2 diabetes mellitus with other specified complication: Secondary | ICD-10-CM

## 2022-07-20 DIAGNOSIS — I10 Essential (primary) hypertension: Secondary | ICD-10-CM

## 2022-07-20 DIAGNOSIS — Z5900 Homelessness unspecified: Secondary | ICD-10-CM

## 2022-07-20 DIAGNOSIS — E1165 Type 2 diabetes mellitus with hyperglycemia: Secondary | ICD-10-CM | POA: Diagnosis not present

## 2022-07-20 DIAGNOSIS — K529 Noninfective gastroenteritis and colitis, unspecified: Secondary | ICD-10-CM | POA: Diagnosis not present

## 2022-07-20 DIAGNOSIS — Z720 Tobacco use: Secondary | ICD-10-CM

## 2022-07-20 HISTORY — DX: COVID-19: U07.1

## 2022-07-20 HISTORY — DX: Acute kidney failure, unspecified: N17.9

## 2022-07-20 LAB — CBG MONITORING, ED
Glucose-Capillary: 170 mg/dL — ABNORMAL HIGH (ref 70–99)
Glucose-Capillary: 73 mg/dL (ref 70–99)

## 2022-07-20 LAB — COMPREHENSIVE METABOLIC PANEL
ALT: 23 U/L (ref 0–44)
AST: 23 U/L (ref 15–41)
Albumin: 3.4 g/dL — ABNORMAL LOW (ref 3.5–5.0)
Alkaline Phosphatase: 73 U/L (ref 38–126)
Anion gap: 9 (ref 5–15)
BUN: 28 mg/dL — ABNORMAL HIGH (ref 6–20)
CO2: 28 mmol/L (ref 22–32)
Calcium: 9.1 mg/dL (ref 8.9–10.3)
Chloride: 102 mmol/L (ref 98–111)
Creatinine, Ser: 1.54 mg/dL — ABNORMAL HIGH (ref 0.61–1.24)
GFR, Estimated: 53 mL/min — ABNORMAL LOW (ref >60)
Glucose, Bld: 84 mg/dL (ref 70–99)
Potassium: 3.4 mmol/L — ABNORMAL LOW (ref 3.5–5.1)
Sodium: 139 mmol/L (ref 135–145)
Total Bilirubin: 0.8 mg/dL (ref 0.3–1.2)
Total Protein: 6.1 g/dL — ABNORMAL LOW (ref 6.5–8.1)

## 2022-07-20 LAB — CBC WITH DIFFERENTIAL/PLATELET
Abs Immature Granulocytes: 0.02 10*3/uL (ref 0.00–0.07)
Basophils Absolute: 0 10*3/uL (ref 0.0–0.1)
Basophils Relative: 1 %
Eosinophils Absolute: 0.2 10*3/uL (ref 0.0–0.5)
Eosinophils Relative: 3 %
HCT: 40.1 % (ref 39.0–52.0)
Hemoglobin: 13.9 g/dL (ref 13.0–17.0)
Immature Granulocytes: 0 %
Lymphocytes Relative: 33 %
Lymphs Abs: 2.7 10*3/uL (ref 0.7–4.0)
MCH: 30.3 pg (ref 26.0–34.0)
MCHC: 34.7 g/dL (ref 30.0–36.0)
MCV: 87.6 fL (ref 80.0–100.0)
Monocytes Absolute: 0.8 10*3/uL (ref 0.1–1.0)
Monocytes Relative: 10 %
Neutro Abs: 4.5 10*3/uL (ref 1.7–7.7)
Neutrophils Relative %: 53 %
Platelets: 238 10*3/uL (ref 150–400)
RBC: 4.58 MIL/uL (ref 4.22–5.81)
RDW: 13.2 % (ref 11.5–15.5)
WBC: 8.3 10*3/uL (ref 4.0–10.5)
nRBC: 0 % (ref 0.0–0.2)

## 2022-07-20 LAB — MAGNESIUM: Magnesium: 2.2 mg/dL (ref 1.7–2.4)

## 2022-07-20 LAB — RAPID URINE DRUG SCREEN, HOSP PERFORMED
Amphetamines: NOT DETECTED
Barbiturates: NOT DETECTED
Benzodiazepines: NOT DETECTED
Cocaine: NOT DETECTED
Opiates: NOT DETECTED
Tetrahydrocannabinol: NOT DETECTED

## 2022-07-20 LAB — GLUCOSE, CAPILLARY
Glucose-Capillary: 136 mg/dL — ABNORMAL HIGH (ref 70–99)
Glucose-Capillary: 132 mg/dL — ABNORMAL HIGH (ref 70–99)

## 2022-07-20 MED ORDER — ACETAMINOPHEN 650 MG RE SUPP: 650.0000 mg | Freq: Four times a day (QID) | RECTAL | Status: DC | PRN

## 2022-07-20 MED ORDER — INSULIN ASPART 100 UNIT/ML IJ SOLN
0.0000 [IU] | Freq: Three times a day (TID) | INTRAMUSCULAR | Status: DC
  Administered 2022-07-20: 2 [IU] via SUBCUTANEOUS
  Administered 2022-07-20: 3 [IU] via SUBCUTANEOUS
  Administered 2022-07-22: 5 [IU] via SUBCUTANEOUS

## 2022-07-20 MED ORDER — ONDANSETRON HCL 4 MG/2ML IJ SOLN: 4.0000 mg | Freq: Four times a day (QID) | INTRAMUSCULAR | Status: DC | PRN

## 2022-07-20 MED ORDER — POTASSIUM CHLORIDE CRYS ER 20 MEQ PO TBCR
40.0000 meq | EXTENDED_RELEASE_TABLET | Freq: Once | ORAL | Status: AC
Start: 2022-07-20 — End: 2022-07-20
  Administered 2022-07-20: 40 meq via ORAL
  Filled 2022-07-20: qty 2

## 2022-07-20 MED ORDER — MOMETASONE FURO-FORMOTEROL FUM 100-5 MCG/ACT IN AERO
2.0000 | INHALATION_SPRAY | Freq: Two times a day (BID) | RESPIRATORY_TRACT | Status: DC
  Administered 2022-07-20 – 2022-07-27 (×14): 2 via RESPIRATORY_TRACT
  Filled 2022-07-20: qty 8.8

## 2022-07-20 MED ORDER — BASAGLAR KWIKPEN 100 UNIT/ML ~~LOC~~ SOPN: 20.0000 [IU] | PEN_INJECTOR | Freq: Every day | SUBCUTANEOUS | Status: DC

## 2022-07-20 MED ORDER — INSULIN GLARGINE-YFGN 100 UNIT/ML ~~LOC~~ SOLN
20.0000 [IU] | Freq: Every day | SUBCUTANEOUS | Status: DC
  Administered 2022-07-20 – 2022-07-27 (×8): 20 [IU] via SUBCUTANEOUS
  Filled 2022-07-20 (×8): qty 0.2

## 2022-07-20 MED ORDER — PANTOPRAZOLE SODIUM 40 MG PO TBEC
40.0000 mg | DELAYED_RELEASE_TABLET | Freq: Every day | ORAL | Status: DC
  Administered 2022-07-20 – 2022-07-27 (×8): 40 mg via ORAL
  Filled 2022-07-20 (×8): qty 1

## 2022-07-20 MED ORDER — ONDANSETRON HCL 4 MG PO TABS: 4.0000 mg | ORAL_TABLET | Freq: Four times a day (QID) | ORAL | Status: DC | PRN

## 2022-07-20 MED ORDER — CARVEDILOL 25 MG PO TABS
25.0000 mg | ORAL_TABLET | Freq: Two times a day (BID) | ORAL | Status: DC
  Administered 2022-07-20 – 2022-07-27 (×14): 25 mg via ORAL
  Filled 2022-07-20 (×13): qty 1
  Filled 2022-07-20: qty 2
  Filled 2022-07-20: qty 1

## 2022-07-20 MED ORDER — ATORVASTATIN CALCIUM 40 MG PO TABS
40.0000 mg | ORAL_TABLET | Freq: Every day | ORAL | Status: DC
  Administered 2022-07-20 – 2022-07-27 (×8): 40 mg via ORAL
  Filled 2022-07-20 (×8): qty 1

## 2022-07-20 MED ORDER — ALBUTEROL SULFATE (2.5 MG/3ML) 0.083% IN NEBU: 2.5000 mg | INHALATION_SOLUTION | RESPIRATORY_TRACT | Status: DC | PRN

## 2022-07-20 MED ORDER — HEPARIN SODIUM (PORCINE) 5000 UNIT/ML IJ SOLN
5000.0000 [IU] | Freq: Three times a day (TID) | INTRAMUSCULAR | Status: DC
  Administered 2022-07-20 – 2022-07-22 (×7): 5000 [IU] via SUBCUTANEOUS
  Filled 2022-07-20 (×6): qty 1

## 2022-07-20 MED ORDER — ACETAMINOPHEN 325 MG PO TABS: 650.0000 mg | ORAL_TABLET | Freq: Four times a day (QID) | ORAL | Status: DC | PRN

## 2022-07-20 MED ORDER — AMLODIPINE BESYLATE 10 MG PO TABS
10.0000 mg | ORAL_TABLET | Freq: Every day | ORAL | Status: DC
  Administered 2022-07-20: 10 mg via ORAL
  Filled 2022-07-20: qty 2

## 2022-07-20 MED ORDER — ASPIRIN 325 MG PO TABS
325.0000 mg | ORAL_TABLET | Freq: Every day | ORAL | Status: DC
  Administered 2022-07-20 – 2022-07-27 (×8): 325 mg via ORAL
  Filled 2022-07-20 (×8): qty 1

## 2022-07-20 MED ORDER — INSULIN ASPART 100 UNIT/ML IJ SOLN: 0.0000 [IU] | Freq: Every day | INTRAMUSCULAR | Status: DC

## 2022-07-20 MED ORDER — MOLNUPIRAVIR EUA 200MG CAPSULE
4.0000 | ORAL_CAPSULE | Freq: Two times a day (BID) | ORAL | Status: AC
  Administered 2022-07-20 – 2022-07-24 (×9): 800 mg via ORAL
  Filled 2022-07-20: qty 4

## 2022-07-20 NOTE — Assessment & Plan Note (Signed)
Chronic. Still using RW.

## 2022-07-20 NOTE — Plan of Care (Signed)

## 2022-07-20 NOTE — Subjective & Objective (Signed)
CC: N/V/diarrhea x 3 days HPI: 56 year old male history of type 2 diabetes, right MCA stroke with residual left-sided hemiparesis, type 2 diabetes on chronic insulin, hyperlipidemia, chronic tobacco abuse, chronic homelessness presents to the ER today with a 3-day history of nausea vomiting diarrhea.  He has been at the homeless shelter for several months now.  He has had 3 days of diarrhea.  This started first.  Then he proceeded to have nausea and vomiting.  Denies any pulmonary symptoms.  He has had some chills but no documented fevers.  Came the ER today due to persistent nausea vomiting and lack of eating for the last 3 days.  On arrival temp 97.9 heart rate 88 blood pressure 109/72 satting 100% on room air.  Labs showed positive COVID PCR.  Sodium 134, potassium 3.2, BUN of 37, creatinine 2.0, glucose 256  Lipase 38  Magnesium 2.4  White count 8.7, hemoglobin 14.9, platelets of 272.  CT abdomen pelvis demonstrates no acute intra-abdominal pathology.  He is a stable for centimeter left adrenal adenoma.   Chest x-ray showed no acute cardiopulmonary disease.  Due to patient's AKI, [facility] hospitalist contacted for admission.

## 2022-07-20 NOTE — Assessment & Plan Note (Signed)
Lives in homeless shelter at present time.

## 2022-07-20 NOTE — ED Notes (Signed)
Checked patient cbg it was 170 notified RN of blood sugar patient is resting with call bell in reach

## 2022-07-20 NOTE — Assessment & Plan Note (Signed)
Continue norvasc 10 mg, coreg 25 mg. Hold diovan-hct due to AKI

## 2022-07-20 NOTE — H&P (Signed)
History and Physical    Stanley Stone PFX:[id number] DOB: 1966/06/23 DOA: 07/19/2022  DOS: the patient was seen and examined on 07/19/2022  PCP: [name], MD   Patient coming from:  homeless shelter  I have personally briefly reviewed patient's old medical records in [place]  CC: N/V/diarrhea x 3 days HPI: 56 year old male history of type 2 diabetes, right MCA stroke with residual left-sided hemiparesis, type 2 diabetes on chronic insulin, hyperlipidemia, chronic tobacco abuse, chronic homelessness presents to the ER today with a 3-day history of nausea vomiting diarrhea.  He has been at the homeless shelter for several months now.  He has had 3 days of diarrhea.  This started first.  Then he proceeded to have nausea and vomiting.  Denies any pulmonary symptoms.  He has had some chills but no documented fevers.  Came the ER today due to persistent nausea vomiting and lack of eating for the last 3 days.  On arrival temp 97.9 heart rate 88 blood pressure 109/72 satting 100% on room air.  Labs showed positive COVID PCR.  Sodium 134, potassium 3.2, BUN of 37, creatinine 2.0, glucose 256  Lipase 38  Magnesium 2.4  White count 8.7, hemoglobin 14.9, platelets of 272.  CT abdomen pelvis demonstrates no acute intra-abdominal pathology.  He is a stable for centimeter left adrenal adenoma.   Chest x-ray showed no acute cardiopulmonary disease.  Due to patient's AKI, [facility] contacted for admission.   ED Course: CT abd negative. Covid Positive  Review of Systems:  Review of Systems  Constitutional:  Positive for chills. Negative for fever.  HENT: Negative.    Eyes: Negative.   Respiratory: Negative.    Cardiovascular: Negative.   Gastrointestinal:  Positive for diarrhea, nausea and vomiting.  Genitourinary: Negative.   Musculoskeletal: Negative.   Skin: Negative.   Neurological: Negative.   Endo/Heme/Allergies: Negative.   Psychiatric/Behavioral:  Negative.    All other systems reviewed and are negative.   Past Medical History:  Diagnosis Date   Closed fracture dislocation of lumbar spine ([place]) 1987   COPD (chronic obstructive pulmonary disease) ([place])    CVA (cerebral vascular accident) ([place]) 03/25/2022   Diabetes mellitus without complication (HCC)    GERD (gastroesophageal reflux disease)    High cholesterol    Hypertension     History reviewed. No pertinent surgical history.   reports that he has been smoking cigarettes. He has been smoking an average of .5 packs per day. He has never used smokeless tobacco. He reports current alcohol use. He reports that he does not currently use drugs.  Allergies  Allergen Reactions   Penicillins     Swell Up ; happened in teens    No family history on file.  Prior to Admission medications   Medication Sig Start Date End Date Taking? Authorizing Provider  albuterol (VENTOLIN HFA) 108 (90 Base) MCG/ACT inhaler Inhale 2 puffs into the lungs every 6 (six) hours as needed for wheezing or shortness of breath. 01/18/22   [name], MD  amLODipine (NORVASC) 10 MG tablet Take 1 tablet (10 mg total) by mouth daily. 04/05/22   [name], MD  aspirin 325 MG tablet Take 1 tablet (325 mg total) by mouth daily. 03/29/22   Cherene Altes, MD  atorvastatin (LIPITOR) 40 MG tablet Take 1 tablet (40 mg total) by mouth daily. 06/30/22   [name], MD  Blood Glucose Monitoring Suppl (TRUE METRIX METER) w/Device KIT Use as directed  daily to check blood sugar 01/18/22   [name], MD  carvedilol (COREG) 25 MG tablet Take 1 tablet (25 mg total) by mouth 2 (two) times daily with a meal. 06/30/22   [name], MD  cyanocobalamin (VITAMIN B12) 1000 MCG tablet Take 1 tablet (1,000 mcg total) by mouth once daily. 06/16/22   [name], MD  cyclobenzaprine (FLEXERIL) 10 MG tablet Take 1 tablet (10 mg total) by mouth 3 (three) times daily as needed for muscle spasms. 06/27/22    [name], MD  dapagliflozin propanediol (FARXIGA) 10 MG TABS tablet Take 1 tablet (10 mg total) by mouth daily before breakfast. 06/27/22   [name], MD  Dulaglutide (TRULICITY) 5.46 EV/0.3JK SOPN Inject 0.75 mg into the skin once a week. 07/06/22   [name], MD  fluticasone-salmeterol (ADVAIR DISKUS) 100-50 MCG/ACT AEPB Inhale 1 puff into the lungs 2 (two) times daily. 04/27/22   [name], MD  glucose blood (TRUE METRIX BLOOD GLUCOSE TEST) test strip Use as instructed 03/17/22   [name], MD  Insulin Glargine [facility]) 100 UNIT/ML Inject 20 Units into the skin daily. 06/27/22   [name], MD  Insulin Pen Needle (UNIFINE PENTIPS) 31G X 8 MM MISC Use with insulin pen 02/14/22   [name], MD  meloxicam (MOBIC) 15 MG tablet Take 1 tablet (15 mg total) by mouth daily. 04/27/22   [name], MD  metFORMIN (GLUCOPHAGE) 500 MG tablet Take 2 tablets (1,000 mg total) by mouth daily with breakfast. 04/05/22   [name], MD  pantoprazole (PROTONIX) 40 MG tablet Take 1 tablet (40 mg total) by mouth once daily. 06/16/22   [name], MD  polyethylene glycol powder (GLYCOLAX/MIRALAX) 17 GM/SCOOP powder Take 17 g by mouth 2 (two) times daily as needed. 03/31/22   [name], MD  thiamine (VITAMIN B1) 100 MG tablet Take 1 tablet (100 mg total) by mouth once daily. 06/16/22   [name], MD  TRUEplus Lancets 28G MISC Check blood sugar daily 03/17/22   [name], MD  valsartan-hydrochlorothiazide (DIOVAN-HCT) 320-25 MG tablet Take 1 tablet by mouth once daily. 06/16/22   [name], MD    Physical Exam: Vitals:   07/19/22 2135 07/19/22 2149 07/19/22 2230 07/19/22 2330  BP: 115/77 115/77 112/66 110/70  Pulse:  90 83 81  Resp:  $Remo'18 17 14  'tkESU$ Temp:  98.3 F (36.8 C)    TempSrc:  Oral    SpO2:  98% 99% 96%  Weight:      Height:        Physical Exam Vitals and nursing note reviewed.  Constitutional:       General: He is not in acute distress.    Appearance: He is obese. He is not ill-appearing, toxic-appearing or diaphoretic.  HENT:     Head: Normocephalic and atraumatic.     Nose: Nose normal.  Eyes:     General: No scleral icterus. Cardiovascular:     Rate and Rhythm: Normal rate and regular rhythm.     Pulses: Normal pulses.  Pulmonary:     Effort: Pulmonary effort is normal. No respiratory distress.     Breath sounds: Normal breath sounds. No wheezing or rales.  Abdominal:     General: Abdomen is protuberant. Bowel sounds are normal. There is no distension.     Palpations: Abdomen is soft.     Tenderness: There is no abdominal  tenderness. There is no guarding or rebound.  Musculoskeletal:     Right lower leg: No edema.     Left lower leg: No edema.  Skin:    General: Skin is warm and dry.     Capillary Refill: Capillary refill takes less than 2 seconds.  Neurological:     Mental Status: He is alert and oriented to person, place, and time.      Labs on Admission: I have personally reviewed following labs and imaging studies  CBC: Recent Labs  Lab 07/19/22 1427  WBC 8.7  NEUTROABS 5.6  HGB 14.9  HCT 43.4  MCV 87.5  PLT 474   Basic Metabolic Panel: Recent Labs  Lab 07/19/22 1427  NA 134*  K 3.2*  CL 97*  CO2 27  GLUCOSE 256*  BUN 37*  CREATININE 2.00*  CALCIUM 9.3  MG 2.4   GFR: Estimated Creatinine Clearance: 40.7 mL/min (A) (by C-G formula based on SCr of 2 mg/dL (H)). Liver Function Tests: Recent Labs  Lab 07/19/22 1427  AST 24  ALT 29  ALKPHOS 83  BILITOT 0.7  PROT 7.0  ALBUMIN 3.8   Recent Labs  Lab 07/19/22 1427  LIPASE 38   No results for input(s): "AMMONIA" in the last 168 hours. Coagulation Profile: No results for input(s): "INR", "PROTIME" in the last 168 hours. Cardiac Enzymes: Recent Labs  Lab 07/19/22 1427  CKTOTAL 114   BNP (last 3 results) No results for input(s): "PROBNP" in the last 8760 hours. HbA1C: No results for  input(s): "HGBA1C" in the last 72 hours. CBG: No results for input(s): "GLUCAP" in the last 168 hours. Lipid Profile: No results for input(s): "CHOL", "HDL", "LDLCALC", "TRIG", "CHOLHDL", "LDLDIRECT" in the last 72 hours. Thyroid Function Tests: No results for input(s): "TSH", "T4TOTAL", "FREET4", "T3FREE", "THYROIDAB" in the last 72 hours. Anemia Panel: No results for input(s): "VITAMINB12", "FOLATE", "FERRITIN", "TIBC", "IRON", "RETICCTPCT" in the last 72 hours. Urine analysis:    Component Value Date/Time   COLORURINE STRAW (A) 07/19/2022 1427   APPEARANCEUR CLEAR 07/19/2022 1427   LABSPEC 1.008 07/19/2022 1427   PHURINE 5.0 07/19/2022 1427   GLUCOSEU >=500 (A) 07/19/2022 1427   HGBUR MODERATE (A) 07/19/2022 1427   BILIRUBINUR NEGATIVE 07/19/2022 1427   BILIRUBINUR negative 01/18/2022 1131   KETONESUR NEGATIVE 07/19/2022 1427   PROTEINUR NEGATIVE 07/19/2022 1427   UROBILINOGEN 0.2 01/18/2022 1131   NITRITE NEGATIVE 07/19/2022 1427   LEUKOCYTESUR NEGATIVE 07/19/2022 1427    Radiological Exams on Admission: I have personally reviewed images CT ABDOMEN PELVIS WO CONTRAST  Result Date: 07/19/2022 CLINICAL DATA:  Nausea, vomiting, abdominal pain EXAM: CT ABDOMEN AND PELVIS WITHOUT CONTRAST TECHNIQUE: Multidetector CT imaging of the abdomen and pelvis was performed following the standard protocol without IV contrast. RADIATION DOSE REDUCTION: This exam was performed according to the departmental dose-optimization program which includes automated exposure control, adjustment of the mA and/or kV according to patient size and/or use of iterative reconstruction technique. COMPARISON:  05/05/2018 FINDINGS: Lower chest: Benign calcified granuloma within the visualized left lung base. Lung bases are otherwise clear. Visualized heart and pericardium are unremarkable. Hepatobiliary: No focal liver abnormality is seen. No gallstones, gallbladder wall thickening, or biliary dilatation. Pancreas:  Unremarkable Spleen: Unremarkable Adrenals/Urinary Tract: 4.0 x 2.3 cm mass within the left adrenal gland measures 0 Hounsfield units in density and is compatible with a benign adrenal adenoma. This is unchanged from prior examination. Thickening of the right adrenal gland is unchanged. The kidneys are unremarkable. The  bladder is unremarkable. Stomach/Bowel: Stomach is within normal limits. Appendix appears normal. No evidence of bowel wall thickening, distention, or inflammatory changes. Vascular/Lymphatic: Mild aortoiliac atherosclerotic calcification. No aortic aneurysm. Circumaortic left renal vein. No pathologic adenopathy within the abdomen and pelvis. Reproductive: Prostate is unremarkable. Other: No abdominal wall hernia or abnormality. No abdominopelvic ascites. Musculoskeletal: Stable L2 compression deformity with mild loss of height and no retropulsion. No acute bone abnormality. No lytic or blastic bone lesion. IMPRESSION: 1. No acute intra-abdominal pathology identified. No definite radiographic explanation for the patient's reported symptoms. 2. Stable 4 cm left adrenal adenoma. Given its size, surgical consultation is recommended for further management. If there are clinical signs or symptoms of adrenal hyperfunction, biochemical evaluation may be appropriate. Aortic Atherosclerosis (ICD10-I70.0). Electronically Signed   By: Fidela Salisbury M.D.   On: 07/19/2022 23:26   DG Chest Portable 1 View  Result Date: 07/19/2022 CLINICAL DATA:  Chest pain. EXAM: PORTABLE CHEST 1 VIEW COMPARISON:  April 16, 2021. FINDINGS: The heart size and mediastinal contours are within normal limits. Stable calcified granuloma seen in left lower lobe. No acute pulmonary disease is noted. The visualized skeletal structures are unremarkable. IMPRESSION: No active disease. Electronically Signed   By: Marijo Conception M.D.   On: 07/19/2022 21:21    EKG: My personal interpretation of EKG shows:  NSR    Assessment/Plan Principal Problem:   AKI (acute kidney injury) ([place]) Active Problems:   COVID-19 virus infection   DM (diabetes mellitus), type 2 (HCC)   HTN (hypertension)   Homelessness   Tobacco use   History of ischemic right MCA stroke with residual deficit   Hyperlipidemia associated with type 2 diabetes mellitus ([place])   Gastroenteritis    Assessment and Plan: * AKI (acute kidney injury) ([place]) Admit to observation med/surg bed. IVF with LR @ 100 ml/hr. Hold HCTZ, ARB, Farxiga, mobic. Repeat BMP in AM.  COVID-19 virus infection On RA. Has gastroenteritis symptoms for 3 days. Start molnupiravir.  Gastroenteritis Likely due to covid. Will check Gi viral panel as well.  Hyperlipidemia associated with type 2 diabetes mellitus (HCC) Continue lipitor. 40 mg   History of ischemic right MCA stroke with residual deficit Chronic. Still using RW.  Tobacco use Stable. Add nicotine patch.  Homelessness Lives in homeless shelter at present time.  HTN (hypertension) Continue norvasc 10 mg, coreg 25 mg. Hold diovan-hct due to AKI  DM (diabetes mellitus), type 2 (HCC) Continue lantus. Hold metformin, farxiga, trulicity. Add ssi.   DVT prophylaxis: SQ Heparin Code Status: Full Code Family Communication: no family at bedside  Disposition Plan: return to homeless shelter  Consults called: none  Admission status: Observation, Med-Surg   Kristopher Oppenheim, DO [facility] 07/20/2022, 12:44 AM

## 2022-07-20 NOTE — Assessment & Plan Note (Signed)
Stable. Add nicotine patch.

## 2022-07-20 NOTE — Assessment & Plan Note (Signed)
Likely due to covid. Will check Gi viral panel as well.

## 2022-07-20 NOTE — Progress Notes (Signed)
[facility] PLAN OF CARE NOTE Patient: Stanley Stone [id number]:[id number]   PCP: [name], MD DOB: Mar 24, 1966   DOA: 07/19/2022   DOS: 07/20/2022    Patient was admitted by my colleague earlier on 07/20/2022. I have reviewed the H&P as well as assessment and plan and agree with the same. Important changes in the plan are listed below.  Plan of care: Principal Problem:   AKI (acute kidney injury) (HCC) Active Problems:   COVID-19 virus infection   DM (diabetes mellitus), type 2 (HCC)   HTN (hypertension)   Homelessness   Tobacco use   History of ischemic right MCA stroke with residual deficit   Hyperlipidemia associated with type 2 diabetes mellitus (HCC)   Gastroenteritis Continue with IV fluids for now.  Continue with  treatment for COVID.  Author: Lynden Oxford, MD [facility] 07/20/2022 7:06 PM   If 7PM-7AM, please contact night-coverage at [email or web address]

## 2022-07-20 NOTE — Assessment & Plan Note (Signed)
Continue lantus. Hold metformin, farxiga, trulicity. Add ssi.

## 2022-07-20 NOTE — Assessment & Plan Note (Signed)
On RA. Has gastroenteritis symptoms for 3 days. Start molnupiravir.

## 2022-07-20 NOTE — Assessment & Plan Note (Signed)
Continue lipitor 40mg  

## 2022-07-20 NOTE — Assessment & Plan Note (Signed)
Admit to observation med/surg bed. IVF with LR @ 100 ml/hr. Hold HCTZ, ARB, Farxiga, mobic. Repeat BMP in AM.

## 2022-07-21 DIAGNOSIS — F17210 Nicotine dependence, cigarettes, uncomplicated: Secondary | ICD-10-CM | POA: Diagnosis not present

## 2022-07-21 DIAGNOSIS — Z79899 Other long term (current) drug therapy: Secondary | ICD-10-CM | POA: Diagnosis not present

## 2022-07-21 DIAGNOSIS — E785 Hyperlipidemia, unspecified: Secondary | ICD-10-CM | POA: Diagnosis not present

## 2022-07-21 DIAGNOSIS — Z794 Long term (current) use of insulin: Secondary | ICD-10-CM | POA: Diagnosis not present

## 2022-07-21 DIAGNOSIS — Z7984 Long term (current) use of oral hypoglycemic drugs: Secondary | ICD-10-CM | POA: Diagnosis not present

## 2022-07-21 DIAGNOSIS — Z7982 Long term (current) use of aspirin: Secondary | ICD-10-CM | POA: Diagnosis not present

## 2022-07-21 DIAGNOSIS — R112 Nausea with vomiting, unspecified: Secondary | ICD-10-CM | POA: Diagnosis present

## 2022-07-21 DIAGNOSIS — U071 COVID-19: Secondary | ICD-10-CM | POA: Diagnosis not present

## 2022-07-21 DIAGNOSIS — Z7985 Long-term (current) use of injectable non-insulin antidiabetic drugs: Secondary | ICD-10-CM | POA: Diagnosis not present

## 2022-07-21 DIAGNOSIS — E1169 Type 2 diabetes mellitus with other specified complication: Secondary | ICD-10-CM | POA: Diagnosis not present

## 2022-07-21 DIAGNOSIS — Z8673 Personal history of transient ischemic attack (TIA), and cerebral infarction without residual deficits: Secondary | ICD-10-CM | POA: Diagnosis not present

## 2022-07-21 DIAGNOSIS — N179 Acute kidney failure, unspecified: Secondary | ICD-10-CM | POA: Diagnosis not present

## 2022-07-21 DIAGNOSIS — K529 Noninfective gastroenteritis and colitis, unspecified: Secondary | ICD-10-CM | POA: Diagnosis not present

## 2022-07-21 LAB — CBC
HCT: 40.7 % (ref 39.0–52.0)
Hemoglobin: 13.6 g/dL (ref 13.0–17.0)
MCH: 29.9 pg (ref 26.0–34.0)
MCHC: 33.4 g/dL (ref 30.0–36.0)
MCV: 89.5 fL (ref 80.0–100.0)
Platelets: 227 10*3/uL (ref 150–400)
RBC: 4.55 MIL/uL (ref 4.22–5.81)
RDW: 13.2 % (ref 11.5–15.5)
WBC: 7.2 10*3/uL (ref 4.0–10.5)
nRBC: 0 % (ref 0.0–0.2)

## 2022-07-21 LAB — GLUCOSE, CAPILLARY
Glucose-Capillary: 116 mg/dL — ABNORMAL HIGH (ref 70–99)
Glucose-Capillary: 90 mg/dL (ref 70–99)
Glucose-Capillary: 101 mg/dL — ABNORMAL HIGH (ref 70–99)
Glucose-Capillary: 135 mg/dL — ABNORMAL HIGH (ref 70–99)

## 2022-07-21 LAB — BASIC METABOLIC PANEL
Anion gap: 12 (ref 5–15)
BUN: 25 mg/dL — ABNORMAL HIGH (ref 6–20)
CO2: 26 mmol/L (ref 22–32)
Calcium: 9.6 mg/dL (ref 8.9–10.3)
Chloride: 101 mmol/L (ref 98–111)
Creatinine, Ser: 1.61 mg/dL — ABNORMAL HIGH (ref 0.61–1.24)
GFR, Estimated: 50 mL/min — ABNORMAL LOW (ref >60)
Glucose, Bld: 118 mg/dL — ABNORMAL HIGH (ref 70–99)
Potassium: 4.1 mmol/L (ref 3.5–5.1)
Sodium: 139 mmol/L (ref 135–145)

## 2022-07-21 LAB — C-REACTIVE PROTEIN: CRP: 1.1 mg/dL — ABNORMAL HIGH (ref <1.0)

## 2022-07-21 NOTE — Plan of Care (Signed)
Pt is alert and orientedx 4. No prns given. Vitals stable. Sats WNL on room air. No diarrhea this shift. Pt up sitting on edge of bed and reports feeling much better this am.  Problem: Education: Goal: Ability to describe self-care measures that may prevent or decrease complications (Diabetes Survival Skills Education) will improve Outcome: Progressing Goal: Individualized Educational Video(s) Outcome: Progressing   Problem: Coping: Goal: Ability to adjust to condition or change in health will improve Outcome: Progressing   Problem: Fluid Volume: Goal: Ability to maintain a balanced intake and output will improve Outcome: Progressing   Problem: Health Behavior/Discharge Planning: Goal: Ability to identify and utilize available resources and services will improve Outcome: Progressing Goal: Ability to manage health-related needs will improve Outcome: Progressing   Problem: Metabolic: Goal: Ability to maintain appropriate glucose levels will improve Outcome: Progressing   Problem: Nutritional: Goal: Maintenance of adequate nutrition will improve Outcome: Progressing Goal: Progress toward achieving an optimal weight will improve Outcome: Progressing   Problem: Skin Integrity: Goal: Risk for impaired skin integrity will decrease Outcome: Progressing   Problem: Tissue Perfusion: Goal: Adequacy of tissue perfusion will improve Outcome: Progressing   Problem: Education: Goal: Knowledge of General Education information will improve Description: Including pain rating scale, medication(s)/side effects and non-pharmacologic comfort measures Outcome: Progressing   Problem: Health Behavior/Discharge Planning: Goal: Ability to manage health-related needs will improve Outcome: Progressing   Problem: Clinical Measurements: Goal: Ability to maintain clinical measurements within normal limits will improve Outcome: Progressing Goal: Will remain free from infection Outcome:  Progressing Goal: Diagnostic test results will improve Outcome: Progressing Goal: Respiratory complications will improve Outcome: Progressing Goal: Cardiovascular complication will be avoided Outcome: Progressing   Problem: Activity: Goal: Risk for activity intolerance will decrease Outcome: Progressing   Problem: Nutrition: Goal: Adequate nutrition will be maintained Outcome: Progressing   Problem: Coping: Goal: Level of anxiety will decrease Outcome: Progressing   Problem: Elimination: Goal: Will not experience complications related to bowel motility Outcome: Progressing Goal: Will not experience complications related to urinary retention Outcome: Progressing   Problem: Pain Managment: Goal: General experience of comfort will improve Outcome: Progressing   Problem: Safety: Goal: Ability to remain free from injury will improve Outcome: Progressing   Problem: Skin Integrity: Goal: Risk for impaired skin integrity will decrease Outcome: Progressing

## 2022-07-21 NOTE — TOC Initial Note (Signed)
Transition of Care [facility]) - Initial/Assessment Note    Patient Details  Name: Stanley Stone MRN: [id number] Date of Birth: 1966-06-15  [facility] CM/SW Contact:    Ralene Bathe, LCSWA Phone Number: 07/21/2022, 10:58 AM  Clinical Narrative:                 CSW spoke with patient and was informed that patient is from [facility].  The patient reported that he cannot return to the shelter while COVID positive.  CSW contacted the [facility]  Residents have to be negative before they can stay at the shelter.  TOC will continue to follow.    Expected Discharge Plan: Homeless Shelter Barriers to Discharge: Continued Medical Work up   Patient Goals and CMS Choice Patient states their goals for this hospitalization and ongoing recovery are:: [facility]      Expected Discharge Plan and Services Expected Discharge Plan: Homeless Shelter       Living arrangements for the past 2 months: Homeless Shelter                                      Prior Living Arrangements/Services Living arrangements for the past 2 months: Homeless Shelter Lives with:: Facility Resident Patient language and need for interpreter reviewed:: Yes Do you feel safe going back to the place where you live?: Yes      Need for Family Participation in Patient Care: Yes (Comment) Care giver support system in place?: No (comment)   Criminal Activity/Legal Involvement Pertinent to Current Situation/Hospitalization: No - Comment as needed  Activities of Daily Living Home Assistive Devices/Equipment: Walker (specify type) ADL Screening (condition at time of admission) Patient's cognitive ability adequate to safely complete daily activities?: Yes Is the patient deaf or have difficulty hearing?: No Does the patient have difficulty seeing, even when wearing glasses/contacts?: No Does the patient have difficulty concentrating, remembering, or making decisions?:  No Patient able to express need for assistance with ADLs?: Yes Does the patient have difficulty dressing or bathing?: No Independently performs ADLs?: Yes (appropriate for developmental age) Does the patient have difficulty walking or climbing stairs?: No Weakness of Legs: Both Weakness of Arms/Hands: None  Permission Sought/Granted                  Emotional Assessment       Orientation: : Oriented to  Time, Oriented to Place, Oriented to Self, Oriented to Situation   Psych Involvement: No (comment)  Admission diagnosis:  AKI (acute kidney injury) (HCC) [N17.9] Acute kidney injury (HCC) [N17.9] Patient Active Problem List   Diagnosis Date Noted   AKI (acute kidney injury) (HCC) 07/20/2022   COVID-19 virus infection 07/20/2022   Gastroenteritis 07/20/2022   Hyperlipidemia associated with type 2 diabetes mellitus (HCC) 06/27/2022   History of ischemic right MCA stroke with residual deficit 03/25/2022   Anxiety state 02/18/2022   Onychomycosis 01/18/2022   COPD with chronic bronchitis (HCC) 01/18/2022   DM (diabetes mellitus), type 2 (HCC) 02/07/2019   HTN (hypertension) 02/07/2019   History of alcohol abuse 02/07/2019   Homelessness 02/07/2019   Tobacco use 02/07/2019   PCP:  [name], MD Pharmacy:   [facility] [address], [address] [place] [address] Phone: [phone] Fax: [phone]  [facility] [address] [place]  [place] [address] Phone: [phone] Fax: [phone]  [facility] [address] [place] [address] Phone: [phone] Fax: [phone]     Social Determinants of Health (SDOH) Interventions    Readmission Risk Interventions     No data to display

## 2022-07-21 NOTE — Progress Notes (Signed)
  Progress Note Patient: Stanley Stone [id number]:[id number] DOB: May 22, 1966 DOA: 07/19/2022  DOS: the patient was seen and examined on 07/21/2022  Brief hospital course: PMH of type II DM, CVA, HLD, smoker.  Homeless from shelter.  Present to the hospital with complaints of nausea vomiting and diarrhea. Found to have COVID-19 infection. Currently being treated with molnupiravir. Assessment and Plan: Acute kidney injury. Improving with IV hydration. Monitor.  COVID-19 virus infection. Gastroenteritis. Currently on molnupiravir. Improving.  HLD. Continue statin.  CVA history. Not on any medication right now.  Active smoker. Nicotine patch.  Homeless. Patient is from a shelter. Cannot return to the shelter until patient is out of the isolation.  Type 2 diabetes mellitus, uncontrolled with hyperglycemia. Currently on sliding scale insulin.  Subjective: No nausea no vomiting no fever no chills.  Diarrhea resolved.  Abdominal pain resolved.  Urinary frequency improving.  Physical Exam: Vitals:   07/21/22 0500 07/21/22 0520 07/21/22 1026 07/21/22 1716  BP:  105/71 (!) 144/78 (!) 142/84  Pulse:  72 72 74  Resp:  18 16 17   Temp:  97.7 F (36.5 C) (!) 97.3 F (36.3 C) (!) 97.4 F (36.3 C)  TempSrc:  Oral Oral Oral  SpO2:  99% 95% 95%  Weight: 80.4 kg     Height:       General: Appear in mild distress; no visible Abnormal Neck Mass Or lumps, Conjunctiva normal Cardiovascular: S1 and S2 Present, no Murmur, Respiratory: good respiratory effort, Bilateral Air entry present and bilateral Crackles, no wheezes Abdomen: Bowel Sound present, Non tender  Extremities: no Pedal edema Neurology: alert and oriented to time, place, and person  Gait not checked due to patient safety concerns   Data Reviewed: I have Reviewed nursing notes, Vitals, and Lab results since pt's last encounter. Pertinent lab results CBC BMP and CMP CRP I have ordered test including CBC BMP and CRP    Family  Communication: No one at bedside  Disposition: Status is: Observation  Author: , MD 07/21/2022 7:15 PM  Please look on [email or web address] to find out who is on call.

## 2022-07-21 NOTE — Progress Notes (Signed)
Patient ID: Stanley Stone, male   DOB: 28-Nov-1965, 56 y.o.   MRN: [id number]  Telemetry reported ST elevation. MD informed. Patient had prior ST elevation on ECG.  Lidia Collum, RN

## 2022-07-21 NOTE — Hospital Course (Signed)
PMH of type II DM, CVA, HLD, smoker.  Homeless from shelter.  Present to the hospital with complaints of nausea vomiting and diarrhea. Found to have COVID-19 infection. Currently being treated with molnupiravir. Medically stable since 9/7 patient.  COVID-19 test was performed again on 9/12 at the request of [facility] team.  Test is negative.  Awaiting further guidance from [facility] team.

## 2022-07-22 DIAGNOSIS — N179 Acute kidney failure, unspecified: Secondary | ICD-10-CM | POA: Diagnosis not present

## 2022-07-22 LAB — CBC
HCT: 39.8 % (ref 39.0–52.0)
Hemoglobin: 13.4 g/dL (ref 13.0–17.0)
MCH: 29.8 pg (ref 26.0–34.0)
MCHC: 33.7 g/dL (ref 30.0–36.0)
MCV: 88.6 fL (ref 80.0–100.0)
Platelets: 219 10*3/uL (ref 150–400)
RBC: 4.49 MIL/uL (ref 4.22–5.81)
RDW: 13 % (ref 11.5–15.5)
WBC: 7.2 10*3/uL (ref 4.0–10.5)
nRBC: 0 % (ref 0.0–0.2)

## 2022-07-22 LAB — BASIC METABOLIC PANEL
Anion gap: 11 (ref 5–15)
BUN: 21 mg/dL — ABNORMAL HIGH (ref 6–20)
CO2: 24 mmol/L (ref 22–32)
Calcium: 9 mg/dL (ref 8.9–10.3)
Chloride: 103 mmol/L (ref 98–111)
Creatinine, Ser: 1.43 mg/dL — ABNORMAL HIGH (ref 0.61–1.24)
GFR, Estimated: 58 mL/min — ABNORMAL LOW (ref >60)
Glucose, Bld: 152 mg/dL — ABNORMAL HIGH (ref 70–99)
Potassium: 4.1 mmol/L (ref 3.5–5.1)
Sodium: 138 mmol/L (ref 135–145)

## 2022-07-22 LAB — GLUCOSE, CAPILLARY
Glucose-Capillary: 103 mg/dL — ABNORMAL HIGH (ref 70–99)
Glucose-Capillary: 112 mg/dL — ABNORMAL HIGH (ref 70–99)
Glucose-Capillary: 170 mg/dL — ABNORMAL HIGH (ref 70–99)
Glucose-Capillary: 97 mg/dL (ref 70–99)

## 2022-07-22 LAB — C-REACTIVE PROTEIN: CRP: 0.9 mg/dL (ref <1.0)

## 2022-07-22 MED ORDER — MELATONIN 5 MG PO TABS
5.0000 mg | ORAL_TABLET | Freq: Every day | ORAL | Status: DC
  Administered 2022-07-22 – 2022-07-23 (×2): 5 mg via ORAL
  Filled 2022-07-22 (×2): qty 1

## 2022-07-22 MED ORDER — GLUCERNA SHAKE PO LIQD
237.0000 mL | Freq: Three times a day (TID) | ORAL | Status: DC
  Administered 2022-07-22 – 2022-07-27 (×12): 237 mL via ORAL

## 2022-07-22 MED ORDER — ENOXAPARIN SODIUM 40 MG/0.4ML IJ SOSY
40.0000 mg | PREFILLED_SYRINGE | INTRAMUSCULAR | Status: DC
  Administered 2022-07-23 – 2022-07-27 (×5): 40 mg via SUBCUTANEOUS
  Filled 2022-07-22 (×5): qty 0.4

## 2022-07-22 MED ORDER — NICOTINE 21 MG/24HR TD PT24
21.0000 mg | MEDICATED_PATCH | Freq: Every day | TRANSDERMAL | Status: DC
  Administered 2022-07-22 – 2022-07-26 (×5): 21 mg via TRANSDERMAL
  Filled 2022-07-22 (×6): qty 1

## 2022-07-22 NOTE — Progress Notes (Addendum)
  Progress Note Patient: Stanley Stone [id number] DOB: [birth year] DOA: 07/19/2022  DOS: the patient was seen and examined on 07/22/2022  Brief hospital course: PMH of type II DM, CVA, HLD, smoker.  Homeless from shelter.  Present to the hospital with complaints of nausea vomiting and diarrhea. Found to have COVID-19 infection. Currently being treated with molnupiravir. Assessment and Plan: Acute kidney injury. Improving with IV hydration.  Baseline appears to be normal.  Currently improving but on admission one 2.0. Monitor.  COVID-19 virus infection. Gastroenteritis. Diarrhea resolving.  Nausea vomiting resolving.  CRP improving. Currently on molnupiravir. Improving.  HLD. Continue statin.  CVA history. Currently on aspirin 325 mg.  Monitor.  Active smoker. Nicotine patch.  Homeless. Patient is from a shelter. Cannot return to the shelter until patient is out of the isolation. Patient medically stable but unable to be discharged from the hospital given his homeless situation and COVID-positive diagnosis. Out of the isolation 07/30/2022.  Type 2 diabetes mellitus, uncontrolled with hyperglycemia. Currently on sliding scale insulin. Check hemoglobin A1c. Patient wants to eat larger portions of food although due to uncontrolled diabetes will be only adding Glucerna.  Insomnia. Add melatonin.  Subjective: No acute complaint.  No nausea or vomiting.  Feeling close to baseline.  Physical Exam: Vitals:   07/21/22 2222 07/22/22 0517 07/22/22 1023 07/22/22 1633  BP: 116/65 (!) 148/86 129/85 121/75  Pulse: 74 79 73 75  Resp: 16 17 18 16   Temp: 98.4 F (36.9 C) 97.7 F (36.5 C) 98.2 F (36.8 C) 98.2 F (36.8 C)  TempSrc: Oral Oral    SpO2: 95% 96% 98% 97%  Weight:      Height:       General: Appear in mild distress; no visible Abnormal Neck Mass Or lumps, Conjunctiva normal Cardiovascular: S1 and S2 Present, no Murmur, Respiratory: good respiratory effort,  Bilateral Air entry present and CTA, no Crackles, no wheezes Abdomen: Bowel Sound present, Non tender Extremities: no Pedal edema Neurology: alert and oriented to Self, Place and time.  Data Reviewed: I have Reviewed nursing notes, Vitals, and Lab results since pt's last encounter. Pertinent lab results CBC and BMP and CRP  Family Communication: No one at bedside  Disposition: Status is: Observation Medically stable unable to discharge due to lack of placement.  Author: , MD 07/22/2022 6:31 PM  Please look on [email or web address] to find out who is on call.

## 2022-07-22 NOTE — TOC Progression Note (Addendum)
Transition of Care [facility]) - Initial/Assessment Note    Patient Details  Name: Stanley Stone MRN: [id number] Date of Birth: 1966/02/23  [facility]) CM/SW Contact:    Ralene Bathe, LCSWA Phone Number: 07/22/2022, 11:19 AM  Clinical Narrative:                 CSW spoke with patient and informed of [employer] COVID restrictions.  The patient reports that he does not have any natural supports that he can stay with. The patient has been staying at the [facility] since May.  CSW contacted [facility] leadership for assistance and is awaiting a response.  1300- CSW spoke with [facility] Leadership, Macario Golds, and was directed to call Roanna Raider with [facility] vulnerable populations.   CSW contacted Ms. Nolen Mu and was encouraged to call [facility] to inquire about the facility's ability to accept COVID positive individuals.  CSW called [facility].  There was no answer as they are closed for the day.  CSW left a VM requesting a returned call.      TOC will continue to follow.   Expected Discharge Plan: Homeless Shelter Barriers to Discharge: Continued Medical Work up   Patient Goals and CMS Choice Patient states their goals for this hospitalization and ongoing recovery are:: [facility]      Expected Discharge Plan and Services Expected Discharge Plan: Homeless Shelter       Living arrangements for the past 2 months: Homeless Shelter                                      Prior Living Arrangements/Services Living arrangements for the past 2 months: Homeless Shelter Lives with:: Facility Resident Patient language and need for interpreter reviewed:: Yes Do you feel safe going back to the place where you live?: Yes      Need for Family Participation in Patient Care: Yes (Comment) Care giver support system in place?: No (comment)   Criminal Activity/Legal Involvement Pertinent to Current  Situation/Hospitalization: No - Comment as needed  Activities of Daily Living Home Assistive Devices/Equipment: Environmental consultant (specify type) ADL Screening (condition at time of admission) Patient's cognitive ability adequate to safely complete daily activities?: Yes Is the patient deaf or have difficulty hearing?: No Does the patient have difficulty seeing, even when wearing glasses/contacts?: No Does the patient have difficulty concentrating, remembering, or making decisions?: No Patient able to express need for assistance with ADLs?: Yes Does the patient have difficulty dressing or bathing?: No Independently performs ADLs?: Yes (appropriate for developmental age) Does the patient have difficulty walking or climbing stairs?: No Weakness of Legs: Both Weakness of Arms/Hands: None  Permission Sought/Granted                  Emotional Assessment       Orientation: : Oriented to  Time, Oriented to Place, Oriented to Self, Oriented to Situation   Psych Involvement: No (comment)  Admission diagnosis:  AKI (acute kidney injury) (HCC) [N17.9] Acute kidney injury (HCC) [N17.9] Patient Active Problem List   Diagnosis Date Noted   AKI (acute kidney injury) (HCC) 07/20/2022   COVID-19 virus infection 07/20/2022   Gastroenteritis 07/20/2022   Hyperlipidemia associated with type 2 diabetes mellitus (HCC) 06/27/2022   History of ischemic right MCA stroke with residual deficit 03/25/2022   Anxiety state 02/18/2022   Onychomycosis  01/18/2022   COPD with chronic bronchitis (HCC) 01/18/2022   DM (diabetes mellitus), type 2 (HCC) 02/07/2019   HTN (hypertension) 02/07/2019   History of alcohol abuse 02/07/2019   Homelessness 02/07/2019   Tobacco use 02/07/2019   PCP:  [name], MD Pharmacy:   [facility] [address], [address] [place] [address] Phone: [phone] Fax: [phone]  [facility] [address] [place] [address] Phone: [phone] Fax: [phone]  [facility] [address] [place] [address] Phone: [phone] Fax: [phone]     Social Determinants of Health (SDOH) Interventions    Readmission Risk Interventions     No data to display

## 2022-07-22 NOTE — Progress Notes (Signed)
Pt asking for a Nicotine patch, RN messaged MD on call.   Stanley Stone Deborah Dondero

## 2022-07-23 DIAGNOSIS — N179 Acute kidney failure, unspecified: Secondary | ICD-10-CM | POA: Diagnosis not present

## 2022-07-23 LAB — GLUCOSE, CAPILLARY
Glucose-Capillary: 116 mg/dL — ABNORMAL HIGH (ref 70–99)
Glucose-Capillary: 111 mg/dL — ABNORMAL HIGH (ref 70–99)
Glucose-Capillary: 107 mg/dL — ABNORMAL HIGH (ref 70–99)
Glucose-Capillary: 125 mg/dL — ABNORMAL HIGH (ref 70–99)

## 2022-07-23 LAB — HEMOGLOBIN A1C
Hgb A1c MFr Bld: 8.6 % — ABNORMAL HIGH (ref 4.8–5.6)
Mean Plasma Glucose: 200.12 mg/dL

## 2022-07-23 MED ORDER — ORAL CARE MOUTH RINSE: 15.0000 mL | OROMUCOSAL | Status: DC | PRN

## 2022-07-23 NOTE — Progress Notes (Signed)
  Progress Note Patient: Stanley Stone [id number] DOB: 1966/03/23 DOA: 07/19/2022  DOS: the patient was seen and examined on 07/23/2022  Brief hospital course: PMH of type II DM, CVA, HLD, smoker.  Homeless from shelter.  Present to the hospital with complaints of nausea vomiting and diarrhea. Found to have COVID-19 infection. Currently being treated with molnupiravir. Medically stable since 9/7 patient.  Currently awaiting Completion of isolation before returning to shelter. Assessment and Plan: Acute kidney injury. Improving with IV hydration.  Baseline appears to be normal.  Currently improving on admission one 2.0.   COVID-19 virus infection. Gastroenteritis. Nausea vomiting Diarrhea resolved.  CRP improving. Currently on molnupiravir.   HLD. Continue statin.   CVA history. Currently on aspirin 325 mg.  Monitor.   Active smoker. Nicotine patch.   Homeless. Patient is from a shelter. Cannot return to the shelter until patient is out of the isolation. Patient medically stable but unable to be discharged from the hospital given his homeless situation and COVID-positive diagnosis. Out of the isolation 07/30/2022.   Type 2 diabetes mellitus, uncontrolled with hyperglycemia. Currently on sliding scale insulin. Hemoglobin A1c 8.6. Patient wants to eat larger portions of food although due to uncontrolled diabetes will be only adding Glucerna.   Insomnia. Add melatonin.  Subjective: No nausea no vomiting no fever no chills.  Frustrated that he is not able to get more carbs today.  Physical Exam: Vitals:   07/22/22 2300 07/23/22 0400 07/23/22 0500 07/23/22 0925  BP:  125/85  (!) 142/88  Pulse:  80  70  Resp:    18  Temp:  97.7 F (36.5 C)  97.7 F (36.5 C)  TempSrc:    Oral  SpO2:  94%  97%  Weight: 81.6 kg  81.6 kg   Height:       Data Reviewed: I have Reviewed nursing notes, Vitals, and Lab results since pt's last encounter. Pertinent lab results  well-controlled CBGs, hemoglobin A1c 8.6.    Family Communication: no one at bed side  Disposition: Status is: Observation Author: Lynden Oxford, MD 07/23/2022 4:51 PM  Please look on [email or web address] to find out who is on call.

## 2022-07-24 DIAGNOSIS — N179 Acute kidney failure, unspecified: Secondary | ICD-10-CM | POA: Diagnosis not present

## 2022-07-24 LAB — GLUCOSE, CAPILLARY
Glucose-Capillary: 110 mg/dL — ABNORMAL HIGH (ref 70–99)
Glucose-Capillary: 103 mg/dL — ABNORMAL HIGH (ref 70–99)
Glucose-Capillary: 109 mg/dL — ABNORMAL HIGH (ref 70–99)
Glucose-Capillary: 149 mg/dL — ABNORMAL HIGH (ref 70–99)

## 2022-07-24 MED ORDER — MELATONIN 5 MG PO TABS
5.0000 mg | ORAL_TABLET | Freq: Every evening | ORAL | Status: DC | PRN
  Administered 2022-07-24 – 2022-07-26 (×3): 5 mg via ORAL
  Filled 2022-07-24 (×3): qty 1

## 2022-07-24 MED ORDER — INSULIN ASPART 100 UNIT/ML IJ SOLN: 0.0000 [IU] | Freq: Every day | INTRAMUSCULAR | Status: DC

## 2022-07-24 MED ORDER — INSULIN ASPART 100 UNIT/ML IJ SOLN
0.0000 [IU] | Freq: Three times a day (TID) | INTRAMUSCULAR | Status: DC
  Administered 2022-07-25: 2 [IU] via SUBCUTANEOUS
  Administered 2022-07-25 – 2022-07-26 (×2): 1 [IU] via SUBCUTANEOUS
  Administered 2022-07-26: 2 [IU] via SUBCUTANEOUS
  Administered 2022-07-26 – 2022-07-27 (×2): 1 [IU] via SUBCUTANEOUS

## 2022-07-24 NOTE — Progress Notes (Signed)
[facility] PROGRESS NOTE  Patient: Stanley Stone [id number]   PCP: [name], MD DOB: 1966-08-12   DOA: 07/19/2022   DOS: 07/24/2022    Subjective: No acute complaints.  No nausea no vomiting no fever no chills.  Objective:  Vitals:   07/24/22 0341 07/24/22 0517 07/24/22 0907 07/24/22 0959  BP:  (!) 140/89  (!) 147/78  Pulse:  74 79 68  Resp:  17 17 19   Temp:  98.4 F (36.9 C)  98.4 F (36.9 C)  TempSrc:      SpO2:  98% 97% 95%  Weight: 81.9 kg     Height:       S1-S2 present. Bowel sound present. Clear to auscultation. No crackles. No wheezing.  Assessment and plan: COVID-19 infection. Completed multiple level. Currently awaiting isolation completion or placement. Medically stable.  Author: , MD [facility] 07/24/2022 4:19 PM   If 7PM-7AM, please contact night-coverage at [email or web address]

## 2022-07-25 DIAGNOSIS — N179 Acute kidney failure, unspecified: Secondary | ICD-10-CM | POA: Diagnosis not present

## 2022-07-25 LAB — GLUCOSE, CAPILLARY
Glucose-Capillary: 130 mg/dL — ABNORMAL HIGH (ref 70–99)
Glucose-Capillary: 133 mg/dL — ABNORMAL HIGH (ref 70–99)
Glucose-Capillary: 167 mg/dL — ABNORMAL HIGH (ref 70–99)
Glucose-Capillary: 103 mg/dL — ABNORMAL HIGH (ref 70–99)

## 2022-07-25 NOTE — Progress Notes (Signed)
[facility] PROGRESS NOTE  Patient: Stanley Stone [id number]   PCP: [name], MD DOB: [birth year]   DOA: 07/19/2022   DOS: 07/25/2022    Subjective: Reports he woke up with some back pain.  Currently no back pain.  No nausea no vomiting.  Objective:  Vitals:   07/24/22 2104 07/25/22 0525 07/25/22 1001 07/25/22 1753  BP: (!) 155/86 (!) 128/109 (!) 158/73 123/83  Pulse: 72 76 76 74  Resp: 17 17  18   Temp: 98.1 F (36.7 C) 98.3 F (36.8 C) (!) 97.5 F (36.4 C) 97.8 F (36.6 C)  TempSrc:   Oral Oral  SpO2: 94% 95% 95% 95%  Weight:  82.1 kg    Height:       S1-S2 present. Clear to auscultation.  Assessment and plan: Medically stable after COVID-19 infection. Currently on isolation. COVID isolation ends on 9/16  Author: 10/16, MD [facility] 07/25/2022 7:01 PM   If 7PM-7AM, please contact night-coverage at [email or web address]

## 2022-07-25 NOTE — Plan of Care (Signed)

## 2022-07-26 DIAGNOSIS — N179 Acute kidney failure, unspecified: Secondary | ICD-10-CM | POA: Diagnosis not present

## 2022-07-26 LAB — GLUCOSE, CAPILLARY
Glucose-Capillary: 122 mg/dL — ABNORMAL HIGH (ref 70–99)
Glucose-Capillary: 129 mg/dL — ABNORMAL HIGH (ref 70–99)
Glucose-Capillary: 200 mg/dL — ABNORMAL HIGH (ref 70–99)
Glucose-Capillary: 152 mg/dL — ABNORMAL HIGH (ref 70–99)

## 2022-07-26 LAB — SARS CORONAVIRUS 2 BY RT PCR: SARS Coronavirus 2 by RT PCR: NEGATIVE

## 2022-07-26 MED ORDER — POLYETHYLENE GLYCOL 3350 17 G PO PACK
17.0000 g | PACK | Freq: Every day | ORAL | Status: DC
  Administered 2022-07-26 – 2022-07-27 (×2): 17 g via ORAL
  Filled 2022-07-26 (×2): qty 1

## 2022-07-26 MED ORDER — DOCUSATE SODIUM 100 MG PO CAPS
100.0000 mg | ORAL_CAPSULE | Freq: Two times a day (BID) | ORAL | Status: DC
  Administered 2022-07-26 – 2022-07-27 (×2): 100 mg via ORAL
  Filled 2022-07-26 (×3): qty 1

## 2022-07-26 NOTE — Plan of Care (Signed)
  Problem: Coping: Goal: Ability to adjust to condition or change in health will improve Outcome: Progressing   Problem: Fluid Volume: Goal: Ability to maintain a balanced intake and output will improve Outcome: Progressing   Problem: Health Behavior/Discharge Planning: Goal: Ability to identify and utilize available resources and services will improve Outcome: Progressing   Problem: Metabolic: Goal: Ability to maintain appropriate glucose levels will improve Outcome: Progressing   Problem: Nutritional: Goal: Maintenance of adequate nutrition will improve Outcome: Progressing   

## 2022-07-26 NOTE — Progress Notes (Signed)
  Progress Note Patient: Stanley Stone [id number] DOB: 01-29-1966 DOA: 07/19/2022  DOS: the patient was seen and examined on 07/26/2022  Brief hospital course: PMH of type II DM, CVA, HLD, smoker.  Homeless from shelter.  Present to the hospital with complaints of nausea vomiting and diarrhea. Found to have COVID-19 infection. Currently being treated with molnupiravir. Medically stable since 9/7 patient.  COVID-19 test was performed again on 9/12 at the request of [facility] team.  Test is negative.  Awaiting further guidance from [facility] team. Assessment and Plan: Acute kidney injury. Improving with IV hydration.  Baseline appears to be normal.  Currently improving on admission one 2.0.   COVID-19 virus infection. Gastroenteritis. Nausea vomiting Diarrhea resolved.  CRP improving. Completed molnupiravir. Initial CT value was 28.  Repeat COVID-19 on 9/12 is negative. Test performed at the request of [facility] team. We will follow further recommendation from [facility] team. Per patient his shelter should not have any issue taking him back if his test is negative. I attempted to reach infection prevention team twice without any success to remove patient's isolation.  HLD. Continue statin.   CVA history. Currently on aspirin 325 mg.  Monitor.   Active smoker. Nicotine patch.   Homeless. Patient is from a shelter. Cannot return to the shelter until patient is out of the isolation. Patient medically stable but unable to be discharged from the hospital given his homeless situation and COVID-positive diagnosis. Out of the isolation 07/30/2022.  Will await further recommendation or TOC team given his negative COVID test.   Type 2 diabetes mellitus, uncontrolled with hyperglycemia. Currently on sliding scale insulin. Hemoglobin A1c 8.6. Continue Glucerna.  Insomnia. Add melatonin.  Diarrhea.  Now constipated. Patient had diarrhea prior to admission which is most likely COVID-related. Now appears  to have constipation. We will initiate stool softener.  Subjective: No acute complaint no nausea no vomiting.  Reports constipation for last 2 days.  Physical Exam: Vitals:   07/26/22 0500 07/26/22 0508 07/26/22 0831 07/26/22 1938  BP:  122/70 133/73   Pulse:  84    Resp:  18 16   Temp:  98.4 F (36.9 C) 98.4 F (36.9 C)   TempSrc:   Oral   SpO2:  95% 97% 96%  Weight: 81.5 kg     Height:       Data Reviewed: I reviewed COVID-19 results.  Family Communication: no one at bed side  Disposition: Status is: Observation Author: Lynden Oxford, MD 07/26/2022 7:57 PM  Please look on [email or web address] to find out who is on call.

## 2022-07-27 ENCOUNTER — Encounter: Payer: Self-pay | Admitting: Critical Care Medicine

## 2022-07-27 DIAGNOSIS — K529 Noninfective gastroenteritis and colitis, unspecified: Secondary | ICD-10-CM | POA: Diagnosis not present

## 2022-07-27 DIAGNOSIS — I10 Essential (primary) hypertension: Secondary | ICD-10-CM

## 2022-07-27 DIAGNOSIS — Z720 Tobacco use: Secondary | ICD-10-CM | POA: Diagnosis not present

## 2022-07-27 DIAGNOSIS — Z5900 Homelessness unspecified: Secondary | ICD-10-CM

## 2022-07-27 DIAGNOSIS — N179 Acute kidney failure, unspecified: Secondary | ICD-10-CM | POA: Diagnosis not present

## 2022-07-27 DIAGNOSIS — Z794 Long term (current) use of insulin: Secondary | ICD-10-CM

## 2022-07-27 DIAGNOSIS — U071 COVID-19: Secondary | ICD-10-CM | POA: Diagnosis not present

## 2022-07-27 DIAGNOSIS — E1165 Type 2 diabetes mellitus with hyperglycemia: Secondary | ICD-10-CM

## 2022-07-27 DIAGNOSIS — Z8673 Personal history of transient ischemic attack (TIA), and cerebral infarction without residual deficits: Secondary | ICD-10-CM

## 2022-07-27 LAB — GLUCOSE, CAPILLARY: Glucose-Capillary: 134 mg/dL — ABNORMAL HIGH (ref 70–99)

## 2022-07-27 NOTE — TOC Transition Note (Signed)
Transition of Care [facility]) - CM/SW Discharge Note   Patient Details  Name: Stanley Stone MRN: [id number] Date of Birth: 13-Jun-1966  Transition of Care [facility]) CM/SW Contact:  Ralene Bathe, LCSWA Phone Number: 07/27/2022, 10:48 AM   Clinical Narrative:    Patient will DC to: [facility] Anticipated DC date: 07/27/2022 Transport by: Therapist, music   Per MD patient ready for DC to homeless shelter.  Patient was provided with a bus pass for transpiration to the facility.   CSW will sign off for now as social work intervention is no longer needed. Please consult [place] again if new needs arise.     Final next level of care: Homeless Shelter Barriers to Discharge: Barriers Resolved   Patient Goals and CMS Choice Patient states their goals for this hospitalization and ongoing recovery are:: [facility]      Discharge Placement                Patient to be transferred to facility by: Public Transportation Name of family member notified: Patient notified as patient is alert and oriented Patient and family notified of of transfer: 07/27/22  Discharge Plan and Services                                     Social Determinants of Health (SDOH) Interventions     Readmission Risk Interventions     No data to display

## 2022-07-27 NOTE — Progress Notes (Signed)
DISCHARGE NOTE HOME [facility] to be discharged Boarding Home per MD order. Discussed prescriptions and follow up appointments with the patient. medication list explained in detail. Patient verbalized understanding.  Skin clean, dry and intact without evidence of skin break down, no evidence of skin tears noted. IV catheter discontinued intact. Site without signs and symptoms of complications. Dressing and pressure applied. Pt denies pain at the site currently. No complaints noted.  Patient free of lines, drains, and wounds.   An After Visit Summary (AVS) was printed and given to the patient. Patient escorted via wheelchair, and discharged shelter via bus  Margarita Grizzle, RN

## 2022-07-27 NOTE — Plan of Care (Signed)

## 2022-07-27 NOTE — Plan of Care (Signed)
Problem: Education: Goal: Ability to describe self-care measures that may prevent or decrease complications (Diabetes Survival Skills Education) will improve 07/27/2022 1046 by Anastasio Auerbach, RN Outcome: Completed/Met 07/27/2022 0829 by Anastasio Auerbach, RN Outcome: Progressing Goal: Individualized Educational Video(s) 07/27/2022 1046 by Anastasio Auerbach, RN Outcome: Completed/Met 07/27/2022 0829 by Anastasio Auerbach, RN Outcome: Progressing   Problem: Coping: Goal: Ability to adjust to condition or change in health will improve 07/27/2022 1046 by Anastasio Auerbach, RN Outcome: Completed/Met 07/27/2022 0829 by Anastasio Auerbach, RN Outcome: Progressing   Problem: Fluid Volume: Goal: Ability to maintain a balanced intake and output will improve 07/27/2022 1046 by Anastasio Auerbach, RN Outcome: Completed/Met 07/27/2022 0829 by Anastasio Auerbach, RN Outcome: Progressing   Problem: Health Behavior/Discharge Planning: Goal: Ability to identify and utilize available resources and services will improve 07/27/2022 1046 by Anastasio Auerbach, RN Outcome: Completed/Met 07/27/2022 0829 by Anastasio Auerbach, RN Outcome: Progressing Goal: Ability to manage health-related needs will improve 07/27/2022 1046 by Anastasio Auerbach, RN Outcome: Completed/Met 07/27/2022 0829 by Anastasio Auerbach, RN Outcome: Progressing   Problem: Metabolic: Goal: Ability to maintain appropriate glucose levels will improve 07/27/2022 1046 by Anastasio Auerbach, RN Outcome: Completed/Met 07/27/2022 0829 by Anastasio Auerbach, RN Outcome: Progressing   Problem: Nutritional: Goal: Maintenance of adequate nutrition will improve 07/27/2022 1046 by Anastasio Auerbach, RN Outcome: Completed/Met 07/27/2022 0829 by Anastasio Auerbach, RN Outcome: Progressing Goal: Progress toward achieving an optimal weight will improve 07/27/2022 1046 by Anastasio Auerbach, RN Outcome: Completed/Met 07/27/2022 0829 by Anastasio Auerbach, RN Outcome: Progressing   Problem: Skin  Integrity: Goal: Risk for impaired skin integrity will decrease 07/27/2022 1046 by Anastasio Auerbach, RN Outcome: Completed/Met 07/27/2022 0829 by Anastasio Auerbach, RN Outcome: Progressing   Problem: Tissue Perfusion: Goal: Adequacy of tissue perfusion will improve 07/27/2022 1046 by Anastasio Auerbach, RN Outcome: Completed/Met 07/27/2022 0829 by Anastasio Auerbach, RN Outcome: Progressing   Problem: Education: Goal: Knowledge of General Education information will improve Description: Including pain rating scale, medication(s)/side effects and non-pharmacologic comfort measures 07/27/2022 1046 by Anastasio Auerbach, RN Outcome: Completed/Met 07/27/2022 0829 by Anastasio Auerbach, RN Outcome: Progressing   Problem: Health Behavior/Discharge Planning: Goal: Ability to manage health-related needs will improve 07/27/2022 1046 by Anastasio Auerbach, RN Outcome: Completed/Met 07/27/2022 0829 by Anastasio Auerbach, RN Outcome: Progressing   Problem: Clinical Measurements: Goal: Ability to maintain clinical measurements within normal limits will improve 07/27/2022 1046 by Anastasio Auerbach, RN Outcome: Completed/Met 07/27/2022 0829 by Anastasio Auerbach, RN Outcome: Progressing Goal: Will remain free from infection 07/27/2022 1046 by Anastasio Auerbach, RN Outcome: Completed/Met 07/27/2022 0829 by Anastasio Auerbach, RN Outcome: Progressing Goal: Diagnostic test results will improve 07/27/2022 1046 by Anastasio Auerbach, RN Outcome: Completed/Met 07/27/2022 0829 by Anastasio Auerbach, RN Outcome: Progressing Goal: Respiratory complications will improve 07/27/2022 1046 by Anastasio Auerbach, RN Outcome: Completed/Met 07/27/2022 0829 by Anastasio Auerbach, RN Outcome: Progressing Goal: Cardiovascular complication will be avoided 07/27/2022 1046 by Anastasio Auerbach, RN Outcome: Completed/Met 07/27/2022 0829 by Anastasio Auerbach, RN Outcome: Progressing   Problem: Activity: Goal: Risk for activity intolerance will decrease 07/27/2022 1046 by Anastasio Auerbach, RN Outcome: Completed/Met 07/27/2022 0829 by Anastasio Auerbach, RN Outcome: Progressing   Problem: Nutrition: Goal: Adequate nutrition will be maintained 07/27/2022 1046 by Anastasio Auerbach, RN Outcome: Completed/Met 07/27/2022 0829 by Anastasio Auerbach, RN Outcome: Progressing   Problem: Coping: Goal: Level of anxiety will decrease 07/27/2022 1046 by Anastasio Auerbach, RN Outcome: Completed/Met 07/27/2022 0829 by Anastasio Auerbach, RN Outcome: Progressing   Problem: Elimination: Goal: Will not experience complications related to bowel motility 07/27/2022 1046  by Anastasio Auerbach, RN Outcome: Completed/Met 07/27/2022 0829 by Anastasio Auerbach, RN Outcome: Progressing Goal: Will not experience complications related to urinary retention 07/27/2022 1046 by Anastasio Auerbach, RN Outcome: Completed/Met 07/27/2022 0829 by Anastasio Auerbach, RN Outcome: Progressing   Problem: Pain Managment: Goal: General experience of comfort will improve 07/27/2022 1046 by Anastasio Auerbach, RN Outcome: Completed/Met 07/27/2022 0829 by Anastasio Auerbach, RN Outcome: Progressing   Problem: Safety: Goal: Ability to remain free from injury will improve 07/27/2022 1046 by Anastasio Auerbach, RN Outcome: Completed/Met 07/27/2022 0829 by Anastasio Auerbach, RN Outcome: Progressing   Problem: Skin Integrity: Goal: Risk for impaired skin integrity will decrease 07/27/2022 1046 by Anastasio Auerbach, RN Outcome: Completed/Met 07/27/2022 0829 by Anastasio Auerbach, RN Outcome: Progressing

## 2022-07-27 NOTE — Discharge Summary (Signed)
Physician Discharge Summary  Stanley Stone KPT:[id number] DOB: 10/07/1966 DOA: 07/19/2022  PCP: [name], MD  Admit date: 07/19/2022 Discharge date: 07/27/2022 Admitted From: Homeless shelter Disposition: Homeless shelter Recommendations for Outpatient Follow-up:  Follow up with PCP in in 1 to 2 weeks Check BMP and CBC at follow-up none Please follow up on the following pending results: None  Home Health: Not indicated Equipment/Devices: Not indictated  Discharge Condition: Stable CODE STATUS: Full code  Follow-up Information     [name], MD. Schedule an appointment as soon as possible for a visit in 1 week(s).   Specialty: Pulmonary Disease Contact information: [address] [place] [address] [phone]                 Hospital course 56 year old M with PMH of IDDM-2, CVA, HLD and tobacco use disorder presenting with nausea, vomiting and diarrhea and found to have AKI and gastroenteritis in the setting of COVID-19 infection.  He was managed conservatively with IV fluid and symptoms resolved.  He was treated with molnupiravir for COVID-19 infection.  He was medically stable for discharge as of 07/21/2022 but was not able to go back to shelter due to COVID-19.  His repeat test on 9/12 was negative.  He was discharged back to homeless shelter on 07/27/2022.  Patient states he has all his medication and shelter and did not need refills on his prescriptions.  At the time of discharge, AKI resolved.  GI symptoms resolved.  Patient does advised to quit smoking cigarettes but not ready.  See individual problem list below for more.   Problems addressed during this hospitalization Principal Problem:   AKI (acute kidney injury) (HCC) Active Problems:   COVID-19 virus infection   DM (diabetes mellitus), type 2 (HCC)   HTN (hypertension)   Homelessness   Tobacco use   History of ischemic right MCA stroke with residual deficit    Hyperlipidemia associated with type 2 diabetes mellitus (HCC)   Gastroenteritis              Vital signs Vitals:   07/26/22 1938 07/26/22 2205 07/27/22 0551 07/27/22 1007  BP:  (!) 151/80 129/89 (!) 134/92  Pulse:  73 71 79  Temp:  98.1 F (36.7 C) 98.1 F (36.7 C) 98.5 F (36.9 C)  Resp:  18 16 18   Height:      Weight:      SpO2: 96% 94% 95% 95%  TempSrc:      BMI (Calculated):         Discharge exam  GENERAL: No apparent distress.  Nontoxic. HEENT: MMM.  Vision and hearing grossly intact.  NECK: Supple.  No apparent JVD.  RESP:  No IWOB.  Fair aeration bilaterally. CVS:  RRR. Heart sounds normal.  ABD/GI/GU: BS+. Abd soft, NTND.  MSK/EXT:  Moves extremities. No apparent deformity. No edema.  SKIN: no apparent skin lesion or wound NEURO: Awake and alert. Oriented appropriately.  No apparent focal neuro deficit. PSYCH: Calm. Normal affect.   Discharge Instructions Discharge Instructions     Call MD for:  difficulty breathing, headache or visual disturbances   Complete by: As directed    Call MD for:  extreme fatigue   Complete by: As directed    Call MD for:  persistant dizziness or light-headedness   Complete by: As directed    Call MD for:  persistant nausea and vomiting   Complete by: As directed    Diet -  low sodium heart healthy   Complete by: As directed    Diet Carb Modified   Complete by: As directed    Discharge instructions   Complete by: As directed    It has been a pleasure taking care of you!  You were hospitalized due to acute kidney injury in the setting of dehydration from nausea, vomiting and diarrhea likely due to COVID-19 infection.  Your symptoms resolved.  Your kidney recovered.  Follow-up with your primary care doctor in 1 to 2 weeks or sooner if needed.   Take care,   Increase activity slowly   Complete by: As directed       Allergies as of 07/27/2022       Reactions   Penicillins Swelling        Medication List      TAKE these medications    Advair Diskus 100-50 MCG/ACT Aepb Generic drug: fluticasone-salmeterol Inhale 1 puff into the lungs 2 (two) times daily.   albuterol 108 (90 Base) MCG/ACT inhaler Commonly known as: VENTOLIN HFA Inhale 2 puffs into the lungs every 6 (six) hours as needed for wheezing or shortness of breath.   amLODipine 10 MG tablet Commonly known as: NORVASC Take 1 tablet (10 mg total) by mouth daily.   aspirin 325 MG tablet Take 1 tablet (325 mg total) by mouth daily.   atorvastatin 40 MG tablet Commonly known as: LIPITOR Take 1 tablet (40 mg total) by mouth daily.   Basaglar KwikPen 100 UNIT/ML Inject 20 Units into the skin daily.   carvedilol 25 MG tablet Commonly known as: COREG Take 1 tablet (25 mg total) by mouth 2 (two) times daily with a meal.   cyanocobalamin 1000 MCG tablet Commonly known as: VITAMIN B12 Take 1 tablet (1,000 mcg total) by mouth once daily.   cyclobenzaprine 10 MG tablet Commonly known as: FLEXERIL Take 1 tablet (10 mg total) by mouth 3 (three) times daily as needed for muscle spasms.   Farxiga 10 MG Tabs tablet Generic drug: dapagliflozin propanediol Take 1 tablet (10 mg total) by mouth daily before breakfast.   metFORMIN 500 MG tablet Commonly known as: GLUCOPHAGE Take 2 tablets (1,000 mg total) by mouth daily with breakfast.   pantoprazole 40 MG tablet Commonly known as: PROTONIX Take 1 tablet (40 mg total) by mouth once daily.   polyethylene glycol powder 17 GM/SCOOP powder Commonly known as: GLYCOLAX/MIRALAX Take 17 g by mouth 2 (two) times daily as needed. What changed: reasons to take this   thiamine 100 MG tablet Commonly known as: VITAMIN B1 Take 1 tablet (100 mg total) by mouth once daily.   Trulicity 0.75 MG/0.5ML Sopn Generic drug: Dulaglutide Inject 0.75 mg into the skin once a week. What changed: additional instructions   valsartan-hydrochlorothiazide 320-25 MG tablet Commonly known as:  DIOVAN-HCT Take 1 tablet by mouth once daily.        Consultations: None  Procedures/Studies: None   CT ABDOMEN PELVIS WO CONTRAST  Result Date: 07/19/2022 CLINICAL DATA:  Nausea, vomiting, abdominal pain EXAM: CT ABDOMEN AND PELVIS WITHOUT CONTRAST TECHNIQUE: Multidetector CT imaging of the abdomen and pelvis was performed following the standard protocol without IV contrast. RADIATION DOSE REDUCTION: This exam was performed according to the departmental dose-optimization program which includes automated exposure control, adjustment of the mA and/or kV according to patient size and/or use of iterative reconstruction technique. COMPARISON:  05/05/2018 FINDINGS: Lower chest: Benign calcified granuloma within the visualized left lung base. Lung bases are otherwise clear. Visualized  heart and pericardium are unremarkable. Hepatobiliary: No focal liver abnormality is seen. No gallstones, gallbladder wall thickening, or biliary dilatation. Pancreas: Unremarkable Spleen: Unremarkable Adrenals/Urinary Tract: 4.0 x 2.3 cm mass within the left adrenal gland measures 0 Hounsfield units in density and is compatible with a benign adrenal adenoma. This is unchanged from prior examination. Thickening of the right adrenal gland is unchanged. The kidneys are unremarkable. The bladder is unremarkable. Stomach/Bowel: Stomach is within normal limits. Appendix appears normal. No evidence of bowel wall thickening, distention, or inflammatory changes. Vascular/Lymphatic: Mild aortoiliac atherosclerotic calcification. No aortic aneurysm. Circumaortic left renal vein. No pathologic adenopathy within the abdomen and pelvis. Reproductive: Prostate is unremarkable. Other: No abdominal wall hernia or abnormality. No abdominopelvic ascites. Musculoskeletal: Stable L2 compression deformity with mild loss of height and no retropulsion. No acute bone abnormality. No lytic or blastic bone lesion. IMPRESSION: 1. No acute  intra-abdominal pathology identified. No definite radiographic explanation for the patient's reported symptoms. 2. Stable 4 cm left adrenal adenoma. Given its size, surgical consultation is recommended for further management. If there are clinical signs or symptoms of adrenal hyperfunction, biochemical evaluation may be appropriate. Aortic Atherosclerosis (ICD10-I70.0). Electronically Signed   By: Helyn Numbers M.D.   On: 07/19/2022 23:26   DG Chest Portable 1 View  Result Date: 07/19/2022 CLINICAL DATA:  Chest pain. EXAM: PORTABLE CHEST 1 VIEW COMPARISON:  April 16, 2021. FINDINGS: The heart size and mediastinal contours are within normal limits. Stable calcified granuloma seen in left lower lobe. No acute pulmonary disease is noted. The visualized skeletal structures are unremarkable. IMPRESSION: No active disease. Electronically Signed   By: Lupita Raider M.D.   On: 07/19/2022 21:21       The results of significant diagnostics from this hospitalization (including imaging, microbiology, ancillary and laboratory) are listed below for reference.     Microbiology: Recent Results (from the past 240 hour(s))  SARS Coronavirus 2 by RT PCR (hospital order, performed in [facility] hospital lab) *cepheid single result test* Anterior Nasal Swab     Status: Abnormal   Collection Time: 07/19/22 10:18 PM   Specimen: Anterior Nasal Swab  Result Value Ref Range Status   SARS Coronavirus 2 by RT PCR POSITIVE (A) NEGATIVE Final    Comment: (NOTE) SARS-CoV-2 target nucleic acids are DETECTED  SARS-CoV-2 RNA is generally detectable in upper respiratory specimens  during the acute phase of infection.  Positive results are indicative  of the presence of the identified virus, but do not rule out bacterial infection or co-infection with other pathogens not detected by the test.  Clinical correlation with patient history and  other diagnostic information is necessary to determine patient infection status.   The expected result is negative.  Fact Sheet for Patients:   RoadLapTop.co.za   Fact Sheet for Healthcare Providers:   http://kim-miller.com/    This test is not yet approved or cleared by the [place] FDA and  has been authorized for detection and/or diagnosis of SARS-CoV-2 by FDA under an Emergency Use Authorization (EUA).  This EUA will remain in effect (meaning this test can be used) for the duration of  the COVID-19 declaration under Section 564(b)(1)  of the Act, 21 U.S.C. section 360-bbb-3(b)(1), unless the authorization is terminated or revoked sooner.   Performed at [facility], [address] [place], [place] [address]   SARS Coronavirus 2 by RT PCR (hospital order, performed in [facility] hospital lab) *cepheid single result test* Anterior Nasal Swab  Status: None   Collection Time: 07/26/22  3:27 PM   Specimen: Anterior Nasal Swab  Result Value Ref Range Status   SARS Coronavirus 2 by RT PCR NEGATIVE NEGATIVE Final    Comment: (NOTE) SARS-CoV-2 target nucleic acids are NOT DETECTED.  The SARS-CoV-2 RNA is generally detectable in upper and lower respiratory specimens during the acute phase of infection. The lowest concentration of SARS-CoV-2 viral copies this assay can detect is 250 copies / mL. A negative result does not preclude SARS-CoV-2 infection and should not be used as the sole basis for treatment or other patient management decisions.  A negative result may occur with improper specimen collection / handling, submission of specimen other than nasopharyngeal swab, presence of viral mutation(s) within the areas targeted by this assay, and inadequate number of viral copies (<250 copies / mL). A negative result must be combined with clinical observations, patient history, and epidemiological information.  Fact Sheet for Patients:   RoadLapTop.co.za  Fact Sheet for Healthcare  Providers: http://kim-miller.com/  This test is not yet approved or  cleared by the [place] FDA and has been authorized for detection and/or diagnosis of SARS-CoV-2 by FDA under an Emergency Use Authorization (EUA).  This EUA will remain in effect (meaning this test can be used) for the duration of the COVID-19 declaration under Section 564(b)(1) of the Act, 21 U.S.C. section 360bbb-3(b)(1), unless the authorization is terminated or revoked sooner.  Performed at [facility], [address] [place], [place] [address]      Labs:  CBC: Recent Labs  Lab 07/21/22 0622 07/22/22 0841  WBC 7.2 7.2  HGB 13.6 13.4  HCT 40.7 39.8  MCV 89.5 88.6  PLT 227 219   BMP &GFR Recent Labs  Lab 07/21/22 0622 07/22/22 0841  NA 139 138  K 4.1 4.1  CL 101 103  CO2 26 24  GLUCOSE 118* 152*  BUN 25* 21*  CREATININE 1.61* 1.43*  CALCIUM 9.6 9.0   Estimated Creatinine Clearance: 56.7 mL/min (A) (by C-G formula based on SCr of 1.43 mg/dL (H)). Liver & Pancreas: No results for input(s): "AST", "ALT", "ALKPHOS", "BILITOT", "PROT", "ALBUMIN" in the last 168 hours. No results for input(s): "LIPASE", "AMYLASE" in the last 168 hours. No results for input(s): "AMMONIA" in the last 168 hours. Diabetic: No results for input(s): "HGBA1C" in the last 72 hours. Recent Labs  Lab 07/26/22 0726 07/26/22 1154 07/26/22 1641 07/26/22 2041 07/27/22 0727  GLUCAP 122* 129* 200* 152* 134*   Cardiac Enzymes: No results for input(s): "CKTOTAL", "CKMB", "CKMBINDEX", "TROPONINI" in the last 168 hours. No results for input(s): "PROBNP" in the last 8760 hours. Coagulation Profile: No results for input(s): "INR", "PROTIME" in the last 168 hours. Thyroid Function Tests: No results for input(s): "TSH", "T4TOTAL", "FREET4", "T3FREE", "THYROIDAB" in the last 72 hours. Lipid Profile: No results for input(s): "CHOL", "HDL", "LDLCALC", "TRIG", "CHOLHDL", "LDLDIRECT" in the last 72  hours. Anemia Panel: No results for input(s): "VITAMINB12", "FOLATE", "FERRITIN", "TIBC", "IRON", "RETICCTPCT" in the last 72 hours. Urine analysis:    Component Value Date/Time   COLORURINE STRAW (A) 07/19/2022 1427   APPEARANCEUR CLEAR 07/19/2022 1427   LABSPEC 1.008 07/19/2022 1427   PHURINE 5.0 07/19/2022 1427   GLUCOSEU >=500 (A) 07/19/2022 1427   HGBUR MODERATE (A) 07/19/2022 1427   BILIRUBINUR NEGATIVE 07/19/2022 1427   BILIRUBINUR negative 01/18/2022 1131   KETONESUR NEGATIVE 07/19/2022 1427   PROTEINUR NEGATIVE 07/19/2022 1427   UROBILINOGEN 0.2 01/18/2022 1131   NITRITE  NEGATIVE 07/19/2022 1427   LEUKOCYTESUR NEGATIVE 07/19/2022 1427   Sepsis Labs: Invalid input(s): "PROCALCITONIN", "LACTICIDVEN"   SIGNED:  Almon Hercules, MD  [facility] 07/27/2022, 4:18 PM

## 2022-07-28 ENCOUNTER — Other Ambulatory Visit: Payer: Self-pay

## 2022-07-28 ENCOUNTER — Other Ambulatory Visit: Payer: Self-pay | Admitting: *Deleted

## 2022-07-28 ENCOUNTER — Telehealth: Payer: Self-pay

## 2022-07-28 MED ORDER — PANTOPRAZOLE SODIUM 40 MG PO TBEC
40.0000 mg | DELAYED_RELEASE_TABLET | Freq: Every day | ORAL | 3 refills | Status: DC
  Filled 2022-07-28: qty 30, 30d supply, fill #0
  Filled 2022-07-29: qty 90, 90d supply, fill #0
  Filled 2022-12-31: qty 30, 30d supply, fill #1

## 2022-07-28 MED ORDER — AMLODIPINE BESYLATE 5 MG PO TABS
10.0000 mg | ORAL_TABLET | Freq: Every day | ORAL | 1 refills | Status: DC
  Filled 2022-07-28: qty 30, 30d supply, fill #0
  Filled 2022-07-29: qty 180, 90d supply, fill #0

## 2022-07-28 NOTE — Telephone Encounter (Signed)
Transition Care Management Follow-up Telephone Call Date of discharge and from where: 07/27/2022, [facility] How have you been since you were released from the hospital? I left a message for patient on voicemail to call me back.  I then called Marin Roberts, RN/[facility] and she was with the patient. He was seen by Dr Delford Field yesterday and has an appointment to see him at [facility] on 08/03/2022.  [name] said that he has all of his medications and she will set up a pill box for him today.  She said he is doing well. No questions/ concerns at this time.

## 2022-07-28 NOTE — Progress Notes (Signed)
This patient is seen for post hospital follow-up just discharged today from hospitalization for COVID illness with diarrheal syndrome and dehydration acute kidney injury  On arrival to the shelter his blood pressure is 122/80 saturation 94% room air pulse 78  He is actually not constipated not having diarrhea  On exam he has clear chest abdomen benign cardiac exam unremarkable  We gave him over-the-counter Senokot as for his constipation  We need to get him into the clinic for a posthospital visit with labs an appointment was made in the following week  The discharge summary from the hospitalization was reviewed

## 2022-07-29 ENCOUNTER — Other Ambulatory Visit: Payer: Self-pay

## 2022-08-03 ENCOUNTER — Encounter: Payer: Self-pay | Admitting: Critical Care Medicine

## 2022-08-03 ENCOUNTER — Other Ambulatory Visit: Payer: Self-pay

## 2022-08-03 ENCOUNTER — Ambulatory Visit: Payer: Medicaid Other | Attending: Critical Care Medicine | Admitting: Critical Care Medicine

## 2022-08-03 VITALS — BP 101/69 | HR 83 | Ht 65.0 in | Wt 178.2 lb

## 2022-08-03 DIAGNOSIS — Z5900 Homelessness unspecified: Secondary | ICD-10-CM

## 2022-08-03 DIAGNOSIS — Z720 Tobacco use: Secondary | ICD-10-CM

## 2022-08-03 DIAGNOSIS — N179 Acute kidney failure, unspecified: Secondary | ICD-10-CM | POA: Diagnosis not present

## 2022-08-03 DIAGNOSIS — J449 Chronic obstructive pulmonary disease, unspecified: Secondary | ICD-10-CM | POA: Diagnosis not present

## 2022-08-03 DIAGNOSIS — Z794 Long term (current) use of insulin: Secondary | ICD-10-CM

## 2022-08-03 DIAGNOSIS — I10 Essential (primary) hypertension: Secondary | ICD-10-CM | POA: Diagnosis not present

## 2022-08-03 DIAGNOSIS — Z8673 Personal history of transient ischemic attack (TIA), and cerebral infarction without residual deficits: Secondary | ICD-10-CM

## 2022-08-03 DIAGNOSIS — U071 COVID-19: Secondary | ICD-10-CM

## 2022-08-03 DIAGNOSIS — K529 Noninfective gastroenteritis and colitis, unspecified: Secondary | ICD-10-CM

## 2022-08-03 DIAGNOSIS — E1165 Type 2 diabetes mellitus with hyperglycemia: Secondary | ICD-10-CM

## 2022-08-03 NOTE — Progress Notes (Signed)
New Patient Office Visit  Subjective:  Patient ID: Stanley Stone, male    DOB: Oct 25, 1966  Age: 56 y.o. MRN: [id number]  CC:  [facility] visit medication refills  HPI 01/18/22 Stanley Stone presents for to establish care.  I have not seen this patient since March 2020.  This patient is homeless he lives in an abandoned house in [place].  There is no electricity or heat.  He is sleeping on the floor.  Patient has severe hypertension and type 2 diabetes.  On arrival blood pressure 180/105.  He was given prescription last week for Zestoretic along with metformin and atorvastatin.  Patient's been having significant dizziness emesis epigastric abdominal pain some blood intermixed in the emesis.  He drinks about 1 beer a day.  If he stops drinking beer he feels he might go into delirium tremens.  He has a history of COPD and does have a chronic cough with some wheezing.  On arrival A1c is 9.1 blood sugar is 232.  He does not have a meter currently to measure his blood sugar.  He eats a lot of carbohydrates because he gets food from charts of food pantry's that have largely carbohydrate-based meals.  Patient does note shortness of breath with exertion.  He does not have any rectal bleeding.  He notes some numbness in the hands and in the feet.  04/05/22 This patient was seen last week at the [facility] today's visit is a posthospital visit for medication refills. Below is documentation from the last visit Seen 5/17 at [facility]: This is a transition of care visit which occurred in the [place].  This is a 56 year old male primary care patient of mine who is seen post hospital for recent left MCA stroke.  He was just discharged this week.  Below is a copy of the discharge summary   Date of Admission: 03/25/2022 Date of Discharge: 03/28/2022   Attending Physician:Jeffrey Hennie Duos, MD   Patient's ZGY:FVCBSW, [name], MD   Consults: Neurology   Disposition:  Discharge home   Follow-up Appts:   Follow-up Information       [name], MD Follow up.   Specialty: Pulmonary Disease Contact information: [address] [place] [address] [phone]              [facility]. Schedule an appointment as soon as possible for a visit in 1 month(s).   Specialty: Neurology Contact information: [address] 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  Since the initial onset it has waxed and waned in an unpredictable fashion.   Hospital Course:   Left brain hypoperfusion versus TIA due to left MCA occlusion/large vessel disease - R arm weakness -CT head without acute findings -MRI brain negative for acute/subacute CVA -mild chronic small vessel ischemic disease noted with cerebral and cerebellar atrophy -MRa head notes severe intracranial atherosclerosis with a left MCA occlusion -MRa neck negative -MRI C-spine notes cervical spine degeneration  most notable at C4-5 with right foraminal impingement and right ventral cord indentation -TTE notes EF 60-65% with normal LV function and no WMA with grade 1 DD and no intracardiac source of embolism appreciable -LDL 76 -continue Lipitor without change -A1c 8.2 Neurology recommending ASA 325 mg plus Plavix for 3 months then ASA alone, and avoidance of hypotension   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 with right foraminal impingement and right ventral cord indentation [name] - Neurology recommending outpatient EMGs/NCS -symptoms have spontaneously resolved during his hospital stay   Vitamin B12 deficiency Vitamin B12 quite low at 228 - supplemented with subcutaneous load while inpatient, and oral replacement after discharge - B12 should be rechecked in 8-12 weeks to assure he is able to absorb it orally    Hypokalemia Likely due to poor nutritional status -corrected with supplementation   DM2 A1c 8.2 -CBG well controlled as inpatient   COPD with chronic bronchitis  No acute exacerbation   Tobacco use Discussed need for absolute tobacco cessation with patient -utilized nicotine patch to assist w/ cessation    Homelessness Chronically homeless. Pt lives in a tent. TOC/CSW assisted with maximizing outpatient f/u and resources. All new meds provided to patient at bedside from [facility] prior to d/c home.    History of alcohol abuse Pt has not had any alcohol in 5 days -no evidence of withdrawal during this admission - encouraged continued abstinence    HTN Avoid overcorrection/hypotension in setting of significant intracranial vascular disease       This patient had been living in a tent in [facility] he now is in the [place] thanks to [employer].  The patient was discharged with out his insulin and also without any aspirin.  He did receive 3 transition of care pharmacy all of his  other medications.   Med list is noted below       Outpatient Encounter Medications as of 03/30/2022  Medication Sig   albuterol (VENTOLIN HFA) 108 (90 Base) MCG/ACT inhaler Inhale 2 puffs into the lungs every 6 (six) hours as needed for wheezing or shortness of breath.   amLODipine (NORVASC) 10 MG tablet Take 1 tablet (10 mg total) by mouth daily.   aspirin 325 MG tablet Take 1 tablet (325 mg total) by mouth daily.   atorvastatin (LIPITOR) 40 MG tablet Take 1 tablet (40 mg total) by mouth daily.   Blood Glucose Monitoring Suppl (TRUE METRIX METER) w/Device KIT Use as directed daily to check blood sugar   carvedilol (COREG) 25 MG tablet Take 1 tablet (25 mg total) by mouth 2 (two) times daily with a meal.   clopidogrel (PLAVIX) 75 MG tablet Take 1 tablet (75 mg total) by mouth daily.   fluticasone-salmeterol (ADVAIR HFA) 115-21 MCG/ACT inhaler Inhale 2 puffs into the lungs 2 (two) times daily.   glucose blood (TRUE METRIX BLOOD GLUCOSE TEST) test strip  Use as instructed   Insulin Glargine (BASAGLAR KWIKPEN) 100 UNIT/ML Inject 10 Units into the skin daily.   Insulin Pen Needle (UNIFINE PENTIPS) 31G X 8 MM MISC Use with insulin pen   lisinopril-hydrochlorothiazide (ZESTORETIC) 20-25 MG tablet Take 1 tablet by mouth daily.   metFORMIN (GLUCOPHAGE) 500 MG tablet Take 1 tablet (500 mg total) by mouth 2 (two) times daily with a meal. (Patient not taking: Reported on 03/25/2022)   nicotine (NICODERM CQ - DOSED IN MG/24 HOURS) 21 mg/24hr patch Place 1 patch (21 mg total) onto the skin daily.   pantoprazole (PROTONIX) 40 MG tablet Take 1 tablet (40 mg total) by mouth daily.   thiamine 100 MG tablet Take 1 tablet (100 mg total) by mouth daily.   traMADol (ULTRAM) 50 MG tablet Take 1 tablet (50 mg total) by mouth every 6 (six) hours as needed for moderate pain.   TRUEplus Lancets 28G MISC Check blood sugar daily   vitamin B-12 (CYANOCOBALAMIN) 1000 MCG tablet Take 1 tablet (1,000 mcg total) by mouth  daily.    No facility-administered encounter medications on file as of 03/30/2022.    Patient is still struggling with partial aphasia and left-sided weakness.  He was not established with any physical therapy or speech therapy post hospital as he is uninsured.   On exam blood pressure is elevated 165/81 saturation 97% pulse 86 patient demonstrates aphasia he has right-sided upper extremity weakness Chest is clear Cardiac exam unremarkable   Impression is that of recent MCA stroke you will need access to his insulin products he will need post hospital follow-up in my clinic as soon as possible we will establish this  We did give him samples of 325 mg aspirin all his other medications are accessible and have been reconciled  We will endeavor to see if we can get this patient physical therapy and speech therapy through some type of grant program as he is uninsured    Today in the clinic the patient has difficulty with memory still has trouble with ambulation using a rollator has a follow-up appointment June 6 at rehab for physical therapy and speech-language pathology.  On arrival blood pressure is elevated 150/92.  The patient has not been taking his medications recently.  He is also been skipping his metformin.  Blood sugar today is elevated.  He is only taking 10 units of Lantus insulin daily.  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  He is smoking 5 cigarettes daily at this time.  He is only on Zestoretic 20/25 daily.   8/14 Patient seen today in return follow-up on arrival blood pressure is good 136/80.  His symptoms are from stroke are improving.  He is still using a walker.  He still has some neck spasm and the cyclobenzaprine ran out.  On arrival blood sugar 289 A1c is 8.5.  He is still smoking about 1/2 pack a day of cigarettes.  He denies any cough or mucus.  He is now off Plavix after 3 months following his stroke.  He does continue with aspirin 325 mg  daily.  Patient did have visit with disability determination physician who said he stands a good chance of getting disability.  There are no other complaints other than he has trouble staying asleep.  He goes to bed early at 830 and awakens at 2:30 in the morning.  He has a hard time going back to sleep.  He does urinate frequently at night.  9/20 this is  a transition of care visit.  Visit occurring 7 days from admission. This is a return hospital follow-up office visit.  Patient had admission in September 5 through 13 for COVID colitis.  Below is a copy of the discharge summary.  A nurse case management TOC visit is already occurred Admit date: 07/19/2022 Discharge date: 07/27/2022 Admitted From: Homeless shelter Disposition: Homeless shelter Recommendations for Outpatient Follow-up:  Follow up with PCP in in 1 to 2 weeks Check BMP and CBC at follow-up none Please follow up on the following pending results: None   Home Health: Not indicated Equipment/Devices: Not indictated   Discharge Condition: Stable CODE STATUS: Full code   Follow-up Information       [name], MD. Schedule an appointment as soon as possible for a visit in 1 week(s).   Specialty: Pulmonary Disease Contact information: [address] [place] [address] [phone]                          Hospital course 57 year old M with PMH of IDDM-2, CVA, HLD and tobacco use disorder presenting with nausea, vomiting and diarrhea and found to have AKI and gastroenteritis in the setting of COVID-19 infection.  He was managed conservatively with IV fluid and symptoms resolved.  He was treated with molnupiravir for COVID-19 infection.  He was medically stable for discharge as of 07/21/2022 but was not able to go back to shelter due to COVID-19.  His repeat test on 9/12 was negative.  He was discharged back to homeless shelter on 07/27/2022.  Patient states he has all his medication and shelter and did  not need refills on his prescriptions.   At the time of discharge, AKI resolved.  GI symptoms resolved.   Patient does advised to quit smoking cigarettes but not ready.   See individual problem list below for more.    Problems addressed during this hospitalization Principal Problem:   AKI (acute kidney injury) ([place]) Active Problems:   COVID-19 virus infection   DM (diabetes mellitus), type 2 ([place])   HTN (hypertension)   Homelessness   Tobacco use   History of ischemic right MCA stroke with residual deficit   Hyperlipidemia associated with type 2 diabetes mellitus ([place])   Gastroenteritis   Since discharge patient's been doing well blood sugars in the 130s to 140s range.  Patient declines to receive the flu vaccine at this visit.  Patient's breathing is better his abdominal status is improved constipation has resolved.  He notes his appetite is decreased on Trulicity but it appears to be working.  He continues to take his Lantus daily and also takes the [place] daily.  He has refills today.  Blood pressure is improved on valsartan HCT and amlodipine.  Blood pressure today on arrival 101/69. The patient continues to work on disability and Medicaid applications  Patient is improved with vision in the left eye and is walking better as not needing a rollator and has recovered well from stroke Past Medical History:  Diagnosis Date   Closed fracture dislocation of lumbar spine ([place]) 1987   COPD (chronic obstructive pulmonary disease) ([place])    COVID-19 virus infection 07/20/2022   CVA (cerebral vascular accident) ([place]) 03/25/2022   Diabetes mellitus without complication ([place])    GERD (gastroesophageal reflux disease)    High cholesterol    Hypertension     History reviewed. No pertinent surgical history.  History reviewed. No pertinent family history.  Social History   Socioeconomic History   Marital status: Divorced    Spouse name: Not on file   Number of children: Not on file    Years of education: Not on file   Highest education level: Not on file  Occupational History   Not on file  Tobacco Use   Smoking status: Every Day    Packs/day: 0.50    Types: Cigarettes   Smokeless tobacco: Never  Vaping Use   Vaping Use: Never used  Substance and Sexual Activity   Alcohol use: Yes   Drug use: Not Currently   Sexual activity: Not Currently  Other Topics Concern   Not on file  Social History Narrative   Not on file   Social Determinants of Health   Financial Resource Strain: Not on file  Food Insecurity: Food Insecurity Present (07/25/2022)   Hunger Vital Sign    Worried About Running Out of Food in the Last Year: Sometimes true    Ran Out of Food in the Last Year: Sometimes true  Transportation Needs: Unmet Transportation Needs (07/25/2022)   PRAPARE - Hydrologist (Medical): Yes    Lack of Transportation (Non-Medical): Yes  Physical Activity: Not on file  Stress: Not on file  Social Connections: Not on file  Intimate Partner Violence: Not on file    ROS Review of Systems  Constitutional:  Negative for fatigue and fever.  HENT:  Negative for congestion, drooling, ear pain, facial swelling, postnasal drip, rhinorrhea, sinus pressure, sneezing, sore throat, tinnitus, trouble swallowing and voice change.   Eyes:  Negative for visual disturbance.  Respiratory:  Negative for apnea, cough, choking, chest tightness, shortness of breath, wheezing and stridor.   Cardiovascular: Negative.  Negative for chest pain, palpitations and leg swelling.  Gastrointestinal:  Negative for abdominal distention, abdominal pain, anal bleeding, blood in stool, constipation, diarrhea, nausea, rectal pain and vomiting.  Endocrine: Negative for polydipsia, polyphagia and polyuria.  Genitourinary: Negative.   Musculoskeletal:  Negative for arthralgias, gait problem and myalgias.       Hip pain  Skin: Negative.  Negative for rash.  Allergic/Immunologic:  Negative.  Negative for environmental allergies and food allergies.  Neurological:  Negative for dizziness, syncope, speech difficulty, weakness, light-headedness and headaches.  Hematological: Negative.  Negative for adenopathy. Does not bruise/bleed easily.  Psychiatric/Behavioral: Negative.  Negative for agitation, decreased concentration, dysphoric mood, hallucinations, self-injury, sleep disturbance and suicidal ideas. The patient is not nervous/anxious and is not hyperactive.     Objective:   Today's Vitals: BP 101/69   Pulse 83   Ht $R'5\' 5"'xi$  (1.651 m)   Wt 178 lb 3.2 oz (80.8 kg)   SpO2 100%   BMI 29.65 kg/m   Physical Exam Vitals reviewed.  Constitutional:      Appearance: Normal appearance. He is well-developed. He is obese. He is not diaphoretic.  HENT:     Head: Normocephalic and atraumatic.     Nose: Nose normal. No nasal deformity, septal deviation, mucosal edema or rhinorrhea.     Right Sinus: No maxillary sinus tenderness or frontal sinus tenderness.     Left Sinus: No maxillary sinus tenderness or frontal sinus tenderness.     Mouth/Throat:     Mouth: Mucous membranes are moist.     Pharynx: Oropharynx is clear. No oropharyngeal exudate.     Comments: Poor dentition Eyes:     General: No scleral icterus.    Conjunctiva/sclera: Conjunctivae normal.  Pupils: Pupils are equal, round, and reactive to light.  Neck:     Thyroid: No thyromegaly.     Vascular: No carotid bruit or JVD.     Trachea: Trachea normal. No tracheal tenderness or tracheal deviation.  Cardiovascular:     Rate and Rhythm: Normal rate and regular rhythm.     Chest Wall: PMI is not displaced.     Pulses: Normal pulses. No decreased pulses.     Heart sounds: Normal heart sounds, S1 normal and S2 normal. Heart sounds not distant. No murmur heard.    No systolic murmur is present.     No diastolic murmur is present.     No friction rub. No gallop. No S3 or S4 sounds.  Pulmonary:     Effort:  Pulmonary effort is normal. No tachypnea, accessory muscle usage or respiratory distress.     Breath sounds: Normal breath sounds. No stridor. No decreased breath sounds, wheezing, rhonchi or rales.  Chest:     Chest wall: No tenderness.  Abdominal:     General: Bowel sounds are normal. There is no distension.     Palpations: Abdomen is soft. Abdomen is not rigid. There is no mass.     Tenderness: There is no abdominal tenderness. There is no right CVA tenderness, left CVA tenderness, guarding or rebound.     Hernia: No hernia is present.  Musculoskeletal:        General: Normal range of motion.     Cervical back: Normal range of motion and neck supple. No edema, erythema or rigidity. No muscular tenderness. Normal range of motion.  Lymphadenopathy:     Head:     Right side of head: No submental or submandibular adenopathy.     Left side of head: No submental or submandibular adenopathy.     Cervical: No cervical adenopathy.  Skin:    General: Skin is warm and dry.     Coloration: Skin is not pale.     Findings: No rash.     Nails: There is no clubbing.  Neurological:     Mental Status: He is alert and oriented to person, place, and time.     Cranial Nerves: Cranial nerves 2-12 are intact.     Sensory: Sensation is intact. No sensory deficit.     Motor: No weakness, tremor, atrophy, abnormal muscle tone, seizure activity or pronator drift.     Coordination: Coordination is intact.     Gait: Gait abnormal and tandem walk abnormal.  Psychiatric:        Speech: Speech normal.        Behavior: Behavior normal.     Assessment & Plan:   Problem List Items Addressed This Visit       Cardiovascular and Mediastinum   HTN (hypertension) - Primary    Blood pressure well controlled at this time no changes made refills are available      Relevant Orders   Comprehensive metabolic panel   CBC with Differential/Platelet     Respiratory   COPD with chronic bronchitis (HCC)     Continue inhaled medications        Digestive   RESOLVED: Gastroenteritis    Gastroenteritis related to COVID-like illness has resolved        Endocrine   DM (diabetes mellitus), type 2 (HCC)    Improved glycemic control continue current regimen        Genitourinary   AKI (acute kidney injury) ([place])    Reassess  renal panel      Relevant Orders   Comprehensive metabolic panel     Other   Homelessness    Working on housing voucher      Tobacco use       Current smoking consumption amount:  10 cigarettes a day  Dicsussion on advise to quit smoking and smoking impacts: Lung cardiovascular impacts  Patient's willingness to quit: Willing to quit  Methods to quit smoking discussed: Behavioral modification  Medication management of smoking session drugs discussed: Nicotine replacement  Resources provided:  AVS   Setting quit date not established  Follow-up arranged 4 months   Time spent counseling the patient: 5 minutes       History of ischemic right MCA stroke with residual deficit    Significant improvement in functionality      RESOLVED: COVID-19 virus infection    This has resolved now testing negative for COVID      Relevant Orders   Comprehensive metabolic panel   CBC with Differential/Platelet   Outpatient Encounter Medications as of 08/03/2022  Medication Sig   albuterol (VENTOLIN HFA) 108 (90 Base) MCG/ACT inhaler Inhale 2 puffs into the lungs every 6 (six) hours as needed for wheezing or shortness of breath.   amLODipine (NORVASC) 5 MG tablet Take 2 tablets (10 mg total) by mouth daily.   aspirin 325 MG tablet Take 1 tablet (325 mg total) by mouth daily.   atorvastatin (LIPITOR) 40 MG tablet Take 1 tablet (40 mg total) by mouth daily.   carvedilol (COREG) 25 MG tablet Take 1 tablet (25 mg total) by mouth 2 (two) times daily with a meal.   cyanocobalamin (VITAMIN B12) 1000 MCG tablet Take 1 tablet (1,000 mcg total) by mouth once daily.    cyclobenzaprine (FLEXERIL) 10 MG tablet Take 1 tablet (10 mg total) by mouth 3 (three) times daily as needed for muscle spasms.   dapagliflozin propanediol (FARXIGA) 10 MG TABS tablet Take 1 tablet (10 mg total) by mouth daily before breakfast.   Dulaglutide (TRULICITY) 7.04 UG/8.9VQ SOPN Inject 0.75 mg into the skin once a week. (Patient taking differently: Inject 0.75 mg into the skin once a week. Friday)   fluticasone-salmeterol (ADVAIR DISKUS) 100-50 MCG/ACT AEPB Inhale 1 puff into the lungs 2 (two) times daily.   Insulin Glargine (BASAGLAR KWIKPEN) 100 UNIT/ML Inject 20 Units into the skin daily.   metFORMIN (GLUCOPHAGE) 500 MG tablet Take 2 tablets (1,000 mg total) by mouth daily with breakfast.   pantoprazole (PROTONIX) 40 MG tablet Take 1 tablet (40 mg total) by mouth once daily.   polyethylene glycol powder (GLYCOLAX/MIRALAX) 17 GM/SCOOP powder Take 17 g by mouth 2 (two) times daily as needed. (Patient taking differently: Take 17 g by mouth 2 (two) times daily as needed for mild constipation.)   thiamine (VITAMIN B1) 100 MG tablet Take 1 tablet (100 mg total) by mouth once daily.   valsartan-hydrochlorothiazide (DIOVAN-HCT) 320-25 MG tablet Take 1 tablet by mouth once daily.   No facility-administered encounter medications on file as of 08/03/2022.  Follow-up: Return in about 4 months (around 12/03/2022) for htn, diabetes.   Asencion Noble, MD

## 2022-08-03 NOTE — Assessment & Plan Note (Signed)
Blood pressure well controlled at this time no changes made refills are available

## 2022-08-03 NOTE — Assessment & Plan Note (Signed)
  .   Current smoking consumption amount:  10 cigarettes a day  . Dicsussion on advise to quit smoking and smoking impacts: Lung cardiovascular impacts  . Patient's willingness to quit: Willing to quit  . Methods to quit smoking discussed: Behavioral modification  . Medication management of smoking session drugs discussed: Nicotine replacement  . Resources provided:  AVS   . Setting quit date not established  . Follow-up arranged 4 months   Time spent counseling the patient: 5 minutes

## 2022-08-03 NOTE — Assessment & Plan Note (Signed)
Continue inhaled medications 

## 2022-08-03 NOTE — Assessment & Plan Note (Signed)
Working on [employer]

## 2022-08-03 NOTE — Assessment & Plan Note (Signed)
Significant improvement in functionality

## 2022-08-03 NOTE — Patient Instructions (Signed)
Labs today include metabolic panel and blood counts  Pick up your Trulicity Farxiga pantoprazole and amlodipine today from the pharmacy downstairs  Return here to the clinic to see Dr. Joya Gaskins in 4 months

## 2022-08-03 NOTE — Assessment & Plan Note (Signed)
Reassess renal panel 

## 2022-08-03 NOTE — Assessment & Plan Note (Signed)
Gastroenteritis related to COVID-like illness has resolved

## 2022-08-03 NOTE — Assessment & Plan Note (Signed)
Improved glycemic control continue current regimen

## 2022-08-03 NOTE — Assessment & Plan Note (Signed)
This has resolved now testing negative for COVID

## 2022-08-04 ENCOUNTER — Other Ambulatory Visit: Payer: Self-pay | Admitting: *Deleted

## 2022-08-04 ENCOUNTER — Other Ambulatory Visit: Payer: Self-pay

## 2022-08-04 LAB — COMPREHENSIVE METABOLIC PANEL
ALT: 32 IU/L (ref 0–44)
AST: 18 IU/L (ref 0–40)
Albumin/Globulin Ratio: 1.5 (ref 1.2–2.2)
Albumin: 4.2 g/dL (ref 3.8–4.9)
Alkaline Phosphatase: 101 IU/L (ref 44–121)
BUN/Creatinine Ratio: 20 (ref 9–20)
BUN: 39 mg/dL — ABNORMAL HIGH (ref 6–24)
Bilirubin Total: 0.7 mg/dL (ref 0.0–1.2)
CO2: 22 mmol/L (ref 20–29)
Calcium: 9.9 mg/dL (ref 8.7–10.2)
Chloride: 98 mmol/L (ref 96–106)
Creatinine, Ser: 2 mg/dL — ABNORMAL HIGH (ref 0.76–1.27)
Globulin, Total: 2.8 g/dL (ref 1.5–4.5)
Glucose: 94 mg/dL (ref 70–99)
Potassium: 4.7 mmol/L (ref 3.5–5.2)
Sodium: 140 mmol/L (ref 134–144)
Total Protein: 7 g/dL (ref 6.0–8.5)
eGFR: 38 mL/min/{1.73_m2} — ABNORMAL LOW (ref >59)

## 2022-08-04 LAB — CBC WITH DIFFERENTIAL/PLATELET
Basophils Absolute: 0 10*3/uL (ref 0.0–0.2)
Basos: 0 %
EOS (ABSOLUTE): 0.2 10*3/uL (ref 0.0–0.4)
Eos: 2 %
Hematocrit: 43.9 % (ref 37.5–51.0)
Hemoglobin: 15 g/dL (ref 13.0–17.7)
Immature Grans (Abs): 0 10*3/uL (ref 0.0–0.1)
Immature Granulocytes: 0 %
Lymphocytes Absolute: 2.7 10*3/uL (ref 0.7–3.1)
Lymphs: 27 %
MCH: 29.9 pg (ref 26.6–33.0)
MCHC: 34.2 g/dL (ref 31.5–35.7)
MCV: 88 fL (ref 79–97)
Monocytes Absolute: 0.9 10*3/uL (ref 0.1–0.9)
Monocytes: 9 %
Neutrophils Absolute: 6.2 10*3/uL (ref 1.4–7.0)
Neutrophils: 62 %
Platelets: 308 10*3/uL (ref 150–450)
RBC: 5.01 x10E6/uL (ref 4.14–5.80)
RDW: 13.2 % (ref 11.6–15.4)
WBC: 10 10*3/uL (ref 3.4–10.8)

## 2022-08-04 MED ORDER — METFORMIN HCL 500 MG PO TABS
1000.0000 mg | ORAL_TABLET | Freq: Every day | ORAL | 1 refills | Status: DC
  Filled 2022-08-04: qty 60, 30d supply, fill #0

## 2022-08-04 MED ORDER — DAPAGLIFLOZIN PROPANEDIOL 10 MG PO TABS
10.0000 mg | ORAL_TABLET | Freq: Every day | ORAL | 2 refills | Status: DC
  Filled 2022-08-04: qty 30, 30d supply, fill #0

## 2022-08-04 NOTE — Progress Notes (Signed)
Let pt know kidney stable, liver normal, blood count normal, no med changes

## 2022-08-05 ENCOUNTER — Other Ambulatory Visit: Payer: Self-pay

## 2022-08-05 ENCOUNTER — Telehealth: Payer: Self-pay

## 2022-08-05 NOTE — Telephone Encounter (Signed)
-----   Message from [name], MD sent at 08/04/2022  5:48 AM EDT ----- Let pt know kidney stable, liver normal, blood count normal, no med changes

## 2022-08-05 NOTE — Telephone Encounter (Signed)
Pt was called and is aware of results, DOB was confirmed.  ?

## 2022-08-11 ENCOUNTER — Other Ambulatory Visit: Payer: Self-pay

## 2022-08-11 ENCOUNTER — Other Ambulatory Visit: Payer: Self-pay | Admitting: *Deleted

## 2022-08-11 ENCOUNTER — Encounter: Payer: Self-pay | Admitting: *Deleted

## 2022-08-11 MED ORDER — BASAGLAR KWIKPEN 100 UNIT/ML ~~LOC~~ SOPN
20.0000 [IU] | PEN_INJECTOR | Freq: Every day | SUBCUTANEOUS | 2 refills | Status: DC
  Filled 2022-08-11: qty 6, 30d supply, fill #0
  Filled 2022-08-19: qty 3, 15d supply, fill #0
  Filled 2022-09-01: qty 3, 15d supply, fill #1

## 2022-08-11 MED ORDER — DAPAGLIFLOZIN PROPANEDIOL 10 MG PO TABS
10.0000 mg | ORAL_TABLET | Freq: Every day | ORAL | 2 refills | Status: DC
  Filled 2022-08-11: qty 30, 30d supply, fill #0

## 2022-08-11 MED ORDER — TRULICITY 0.75 MG/0.5ML ~~LOC~~ SOAJ
0.7500 mg | SUBCUTANEOUS | 4 refills | Status: DC
  Filled 2022-08-11 – 2022-08-19 (×2): qty 2, 28d supply, fill #0

## 2022-08-11 NOTE — Congregational Nurse Program (Signed)
Pillbox filled, ordered refills

## 2022-08-12 ENCOUNTER — Other Ambulatory Visit: Payer: Self-pay

## 2022-08-18 ENCOUNTER — Other Ambulatory Visit: Payer: Self-pay | Admitting: *Deleted

## 2022-08-18 ENCOUNTER — Other Ambulatory Visit: Payer: Self-pay

## 2022-08-18 MED ORDER — DAPAGLIFLOZIN PROPANEDIOL 10 MG PO TABS
10.0000 mg | ORAL_TABLET | Freq: Every day | ORAL | 2 refills | Status: DC
  Filled 2022-08-18: qty 30, 30d supply, fill #0

## 2022-08-19 ENCOUNTER — Encounter: Payer: Self-pay | Admitting: *Deleted

## 2022-08-19 ENCOUNTER — Other Ambulatory Visit: Payer: Self-pay

## 2022-08-19 NOTE — Congregational Nurse Program (Signed)
Pillbox filled Called to refill insulin, last insulins picked up missing, will pick up today

## 2022-08-25 ENCOUNTER — Other Ambulatory Visit: Payer: Self-pay | Admitting: *Deleted

## 2022-08-25 MED ORDER — DAPAGLIFLOZIN PROPANEDIOL 10 MG PO TABS
10.0000 mg | ORAL_TABLET | Freq: Every day | ORAL | 2 refills | Status: DC
  Filled 2022-08-25: qty 30, 30d supply, fill #0

## 2022-08-25 MED ORDER — VALSARTAN-HYDROCHLOROTHIAZIDE 320-25 MG PO TABS
1.0000 | ORAL_TABLET | Freq: Every day | ORAL | 3 refills | Status: DC
  Filled 2022-08-25: qty 30, 30d supply, fill #0

## 2022-08-26 ENCOUNTER — Other Ambulatory Visit: Payer: Self-pay

## 2022-09-01 ENCOUNTER — Other Ambulatory Visit: Payer: Self-pay | Admitting: *Deleted

## 2022-09-01 ENCOUNTER — Other Ambulatory Visit (HOSPITAL_COMMUNITY): Payer: Self-pay

## 2022-09-01 ENCOUNTER — Other Ambulatory Visit: Payer: Self-pay

## 2022-09-01 ENCOUNTER — Other Ambulatory Visit: Payer: Self-pay | Admitting: Critical Care Medicine

## 2022-09-01 MED ORDER — ATORVASTATIN CALCIUM 40 MG PO TABS
40.0000 mg | ORAL_TABLET | Freq: Every day | ORAL | 2 refills | Status: DC
  Filled 2022-09-01: qty 30, 30d supply, fill #0

## 2022-09-01 MED ORDER — PEN NEEDLES 31G X 5 MM MISC
4.0000 | Freq: Four times a day (QID) | 1 refills | Status: DC
  Filled 2022-09-01: qty 100, 25d supply, fill #0
  Filled 2022-12-31: qty 100, 25d supply, fill #1
  Filled 2023-02-13: qty 100, 25d supply, fill #2

## 2022-09-01 NOTE — Telephone Encounter (Signed)
Requested Prescriptions  Pending Prescriptions Disp Refills  . atorvastatin (LIPITOR) 40 MG tablet 30 tablet 2    Sig: Take 1 tablet (40 mg total) by mouth daily.     Cardiovascular:  Antilipid - Statins Failed - 09/01/2022  5:32 PM      Failed - Lipid Panel in normal range within the last 12 months    Cholesterol, Total  Date Value Ref Range Status  06/27/2022 159 100 - 199 mg/dL Final   LDL Chol Calc (NIH)  Date Value Ref Range Status  06/27/2022 69 0 - 99 mg/dL Final   HDL  Date Value Ref Range Status  06/27/2022 39 (L) >39 mg/dL Final   Triglycerides  Date Value Ref Range Status  06/27/2022 319 (H) 0 - 149 mg/dL Final         Passed - Patient is not pregnant      Passed - Valid encounter within last 12 months    Recent Outpatient Visits          4 weeks ago COVID-19 virus infection   [place] [name], MD   2 months ago Type 2 diabetes mellitus with hyperglycemia, with long-term current use of insulin [facility])   [place] [name], MD   4 months ago Acute ischemic right MCA stroke [facility])   [place] [name], MD   6 months ago Type 2 diabetes mellitus with hyperglycemia, without long-term current use of insulin [facility])   [facility], Cari S, PA-C   7 months ago Type 2 diabetes mellitus without complication, without long-term current use of insulin [facility])   [place], [place]      Future Appointments            In 1 month Joya Gaskins [name], MD [place]

## 2022-09-07 ENCOUNTER — Other Ambulatory Visit: Payer: Self-pay

## 2022-09-08 ENCOUNTER — Other Ambulatory Visit: Payer: Self-pay

## 2022-09-08 ENCOUNTER — Other Ambulatory Visit (HOSPITAL_COMMUNITY): Payer: Self-pay

## 2022-09-08 ENCOUNTER — Other Ambulatory Visit: Payer: Self-pay | Admitting: *Deleted

## 2022-09-08 MED ORDER — ATORVASTATIN CALCIUM 40 MG PO TABS
40.0000 mg | ORAL_TABLET | Freq: Every day | ORAL | 2 refills | Status: DC
  Filled 2022-09-08 – 2022-09-09 (×2): qty 30, 30d supply, fill #0

## 2022-09-08 MED ORDER — METFORMIN HCL 500 MG PO TABS
1000.0000 mg | ORAL_TABLET | Freq: Every day | ORAL | 1 refills | Status: DC
  Filled 2022-09-08: qty 60, 30d supply, fill #0
  Filled 2022-09-09: qty 180, 90d supply, fill #0

## 2022-09-09 ENCOUNTER — Other Ambulatory Visit (HOSPITAL_COMMUNITY): Payer: Self-pay

## 2022-09-09 ENCOUNTER — Other Ambulatory Visit: Payer: Self-pay

## 2022-09-15 ENCOUNTER — Other Ambulatory Visit: Payer: Self-pay

## 2022-09-15 ENCOUNTER — Other Ambulatory Visit: Payer: Self-pay | Admitting: *Deleted

## 2022-09-15 ENCOUNTER — Other Ambulatory Visit (HOSPITAL_COMMUNITY): Payer: Self-pay

## 2022-09-15 MED ORDER — METFORMIN HCL 500 MG PO TABS
1000.0000 mg | ORAL_TABLET | Freq: Every day | ORAL | 1 refills | Status: DC
  Filled 2022-09-15 – 2022-12-31 (×2): qty 120, 60d supply, fill #0

## 2022-09-16 ENCOUNTER — Other Ambulatory Visit: Payer: Self-pay

## 2022-09-21 ENCOUNTER — Other Ambulatory Visit: Payer: Self-pay | Admitting: *Deleted

## 2022-09-21 ENCOUNTER — Other Ambulatory Visit: Payer: Self-pay

## 2022-09-21 ENCOUNTER — Other Ambulatory Visit: Payer: Self-pay | Admitting: Critical Care Medicine

## 2022-09-21 ENCOUNTER — Encounter: Payer: Self-pay | Admitting: Critical Care Medicine

## 2022-09-21 DIAGNOSIS — M5000 Cervical disc disorder with myelopathy, unspecified cervical region: Secondary | ICD-10-CM

## 2022-09-21 MED ORDER — TRULICITY 0.75 MG/0.5ML ~~LOC~~ SOAJ
0.7500 mg | SUBCUTANEOUS | 4 refills | Status: DC
  Filled 2022-09-21: qty 2, 28d supply, fill #0

## 2022-09-21 MED ORDER — BASAGLAR KWIKPEN 100 UNIT/ML ~~LOC~~ SOPN
20.0000 [IU] | PEN_INJECTOR | Freq: Every day | SUBCUTANEOUS | 2 refills | Status: DC
  Filled 2022-09-21: qty 6, 30d supply, fill #0

## 2022-09-21 MED ORDER — AMLODIPINE BESYLATE 10 MG PO TABS
10.0000 mg | ORAL_TABLET | Freq: Every day | ORAL | 2 refills | Status: DC
  Filled 2022-09-21: qty 30, 30d supply, fill #0

## 2022-09-22 ENCOUNTER — Emergency Department (HOSPITAL_COMMUNITY): Payer: Self-pay

## 2022-09-22 ENCOUNTER — Encounter (HOSPITAL_COMMUNITY): Payer: Self-pay | Admitting: Emergency Medicine

## 2022-09-22 ENCOUNTER — Other Ambulatory Visit: Payer: Self-pay

## 2022-09-22 ENCOUNTER — Encounter: Payer: Self-pay | Admitting: *Deleted

## 2022-09-22 ENCOUNTER — Emergency Department (HOSPITAL_COMMUNITY)
Admission: EM | Admit: 2022-09-22 | Discharge: 2022-09-23 | Disposition: A | Discharge status: Home / Self Care | Payer: Self-pay | Attending: Emergency Medicine | Admitting: Emergency Medicine

## 2022-09-22 ENCOUNTER — Other Ambulatory Visit: Payer: Self-pay | Admitting: *Deleted

## 2022-09-22 DIAGNOSIS — R519 Headache, unspecified: Secondary | ICD-10-CM | POA: Insufficient documentation

## 2022-09-22 DIAGNOSIS — I10 Essential (primary) hypertension: Secondary | ICD-10-CM | POA: Insufficient documentation

## 2022-09-22 DIAGNOSIS — Z8616 Personal history of COVID-19: Secondary | ICD-10-CM | POA: Insufficient documentation

## 2022-09-22 DIAGNOSIS — F444 Conversion disorder with motor symptom or deficit: Secondary | ICD-10-CM

## 2022-09-22 DIAGNOSIS — Z7951 Long term (current) use of inhaled steroids: Secondary | ICD-10-CM | POA: Insufficient documentation

## 2022-09-22 DIAGNOSIS — Z8673 Personal history of transient ischemic attack (TIA), and cerebral infarction without residual deficits: Secondary | ICD-10-CM | POA: Insufficient documentation

## 2022-09-22 DIAGNOSIS — Z7982 Long term (current) use of aspirin: Secondary | ICD-10-CM | POA: Insufficient documentation

## 2022-09-22 DIAGNOSIS — E119 Type 2 diabetes mellitus without complications: Secondary | ICD-10-CM | POA: Insufficient documentation

## 2022-09-22 DIAGNOSIS — K209 Esophagitis, unspecified without bleeding: Secondary | ICD-10-CM | POA: Insufficient documentation

## 2022-09-22 DIAGNOSIS — Z7984 Long term (current) use of oral hypoglycemic drugs: Secondary | ICD-10-CM | POA: Insufficient documentation

## 2022-09-22 DIAGNOSIS — Z794 Long term (current) use of insulin: Secondary | ICD-10-CM | POA: Insufficient documentation

## 2022-09-22 DIAGNOSIS — J449 Chronic obstructive pulmonary disease, unspecified: Secondary | ICD-10-CM | POA: Insufficient documentation

## 2022-09-22 DIAGNOSIS — Z79899 Other long term (current) drug therapy: Secondary | ICD-10-CM | POA: Insufficient documentation

## 2022-09-22 DIAGNOSIS — F17210 Nicotine dependence, cigarettes, uncomplicated: Secondary | ICD-10-CM | POA: Insufficient documentation

## 2022-09-22 DIAGNOSIS — R109 Unspecified abdominal pain: Secondary | ICD-10-CM | POA: Insufficient documentation

## 2022-09-22 LAB — URINALYSIS, ROUTINE W REFLEX MICROSCOPIC
Bacteria, UA: NONE SEEN
Bilirubin Urine: NEGATIVE
Glucose, UA: 150 mg/dL — AB
Ketones, ur: NEGATIVE mg/dL
Leukocytes,Ua: NEGATIVE
Nitrite: NEGATIVE
Protein, ur: NEGATIVE mg/dL
Specific Gravity, Urine: 1.005 (ref 1.005–1.030)
pH: 5 (ref 5.0–8.0)

## 2022-09-22 LAB — COMPREHENSIVE METABOLIC PANEL
ALT: 23 U/L (ref 0–44)
AST: 19 U/L (ref 15–41)
Albumin: 3.3 g/dL — ABNORMAL LOW (ref 3.5–5.0)
Alkaline Phosphatase: 67 U/L (ref 38–126)
Anion gap: 15 (ref 5–15)
BUN: 24 mg/dL — ABNORMAL HIGH (ref 6–20)
CO2: 24 mmol/L (ref 22–32)
Calcium: 8.8 mg/dL — ABNORMAL LOW (ref 8.9–10.3)
Chloride: 101 mmol/L (ref 98–111)
Creatinine, Ser: 1.6 mg/dL — ABNORMAL HIGH (ref 0.61–1.24)
GFR, Estimated: 50 mL/min — ABNORMAL LOW (ref >60)
Glucose, Bld: 71 mg/dL (ref 70–99)
Potassium: 3.2 mmol/L — ABNORMAL LOW (ref 3.5–5.1)
Sodium: 140 mmol/L (ref 135–145)
Total Bilirubin: 0.8 mg/dL (ref 0.3–1.2)
Total Protein: 6 g/dL — ABNORMAL LOW (ref 6.5–8.1)

## 2022-09-22 LAB — DIFFERENTIAL
Abs Immature Granulocytes: 0.04 10*3/uL (ref 0.00–0.07)
Basophils Absolute: 0 10*3/uL (ref 0.0–0.1)
Basophils Relative: 0 %
Eosinophils Absolute: 0.2 10*3/uL (ref 0.0–0.5)
Eosinophils Relative: 2 %
Immature Granulocytes: 0 %
Lymphocytes Relative: 24 %
Lymphs Abs: 2.5 10*3/uL (ref 0.7–4.0)
Monocytes Absolute: 0.9 10*3/uL (ref 0.1–1.0)
Monocytes Relative: 8 %
Neutro Abs: 6.8 10*3/uL (ref 1.7–7.7)
Neutrophils Relative %: 66 %

## 2022-09-22 LAB — I-STAT CHEM 8, ED
BUN: 24 mg/dL — ABNORMAL HIGH (ref 6–20)
Calcium, Ion: 1.11 mmol/L — ABNORMAL LOW (ref 1.15–1.40)
Chloride: 98 mmol/L (ref 98–111)
Creatinine, Ser: 1.7 mg/dL — ABNORMAL HIGH (ref 0.61–1.24)
Glucose, Bld: 68 mg/dL — ABNORMAL LOW (ref 70–99)
HCT: 35 % — ABNORMAL LOW (ref 39.0–52.0)
Hemoglobin: 11.9 g/dL — ABNORMAL LOW (ref 13.0–17.0)
Potassium: 3.2 mmol/L — ABNORMAL LOW (ref 3.5–5.1)
Sodium: 138 mmol/L (ref 135–145)
TCO2: 23 mmol/L (ref 22–32)

## 2022-09-22 LAB — CBC
HCT: 33.8 % — ABNORMAL LOW (ref 39.0–52.0)
Hemoglobin: 11.5 g/dL — ABNORMAL LOW (ref 13.0–17.0)
MCH: 30.5 pg (ref 26.0–34.0)
MCHC: 34 g/dL (ref 30.0–36.0)
MCV: 89.7 fL (ref 80.0–100.0)
Platelets: 220 10*3/uL (ref 150–400)
RBC: 3.77 MIL/uL — ABNORMAL LOW (ref 4.22–5.81)
RDW: 14.9 % (ref 11.5–15.5)
WBC: 10.4 10*3/uL (ref 4.0–10.5)
nRBC: 0 % (ref 0.0–0.2)

## 2022-09-22 LAB — RAPID URINE DRUG SCREEN, HOSP PERFORMED
Amphetamines: NOT DETECTED
Barbiturates: NOT DETECTED
Benzodiazepines: NOT DETECTED
Cocaine: NOT DETECTED
Opiates: NOT DETECTED
Tetrahydrocannabinol: NOT DETECTED

## 2022-09-22 LAB — APTT: aPTT: 31 seconds (ref 24–36)

## 2022-09-22 LAB — TROPONIN I (HIGH SENSITIVITY)
Troponin I (High Sensitivity): 6 ng/L (ref <18)
Troponin I (High Sensitivity): 5 ng/L (ref <18)

## 2022-09-22 LAB — PROTIME-INR
INR: 1 (ref 0.8–1.2)
Prothrombin Time: 13.1 seconds (ref 11.4–15.2)

## 2022-09-22 LAB — CK: Total CK: 127 U/L (ref 49–397)

## 2022-09-22 MED ORDER — PANTOPRAZOLE SODIUM 40 MG PO TBEC
40.0000 mg | DELAYED_RELEASE_TABLET | Freq: Every day | ORAL | 0 refills | Status: DC
  Filled 2022-09-22: qty 30, 30d supply, fill #0

## 2022-09-22 MED ORDER — VALSARTAN-HYDROCHLOROTHIAZIDE 320-25 MG PO TABS
1.0000 | ORAL_TABLET | Freq: Every day | ORAL | 3 refills | Status: DC
  Filled 2022-09-22: qty 60, 60d supply, fill #0

## 2022-09-22 MED ORDER — KETOROLAC TROMETHAMINE 15 MG/ML IJ SOLN
15.0000 mg | Freq: Once | INTRAMUSCULAR | Status: AC
  Administered 2022-09-22: 15 mg via INTRAVENOUS
  Filled 2022-09-22: qty 1

## 2022-09-22 MED ORDER — BASAGLAR KWIKPEN 100 UNIT/ML ~~LOC~~ SOPN: 20.0000 [IU] | PEN_INJECTOR | Freq: Every day | SUBCUTANEOUS | 2 refills | Status: DC

## 2022-09-22 MED ORDER — SODIUM CHLORIDE 0.9 % IV BOLUS
1000.0000 mL | Freq: Once | INTRAVENOUS | Status: AC
  Administered 2022-09-22: 1000 mL via INTRAVENOUS

## 2022-09-22 MED ORDER — METOCLOPRAMIDE HCL 5 MG/ML IJ SOLN
5.0000 mg | Freq: Once | INTRAMUSCULAR | Status: AC
  Administered 2022-09-22: 5 mg via INTRAVENOUS
  Filled 2022-09-22: qty 2

## 2022-09-22 MED ORDER — POTASSIUM CHLORIDE CRYS ER 20 MEQ PO TBCR
40.0000 meq | EXTENDED_RELEASE_TABLET | Freq: Once | ORAL | Status: AC
  Administered 2022-09-22: 40 meq via ORAL
  Filled 2022-09-22: qty 2

## 2022-09-22 MED ORDER — IOHEXOL 350 MG/ML SOLN
70.0000 mL | Freq: Once | INTRAVENOUS | Status: AC | PRN
  Administered 2022-09-22: 70 mL via INTRAVENOUS

## 2022-09-22 MED ORDER — ACETAMINOPHEN 325 MG PO TABS
650.0000 mg | ORAL_TABLET | Freq: Once | ORAL | Status: AC
  Administered 2022-09-22: 650 mg via ORAL
  Filled 2022-09-22: qty 2

## 2022-09-22 MED ORDER — LORAZEPAM 2 MG/ML IJ SOLN
INTRAMUSCULAR | Status: AC
  Filled 2022-09-22: qty 1

## 2022-09-22 MED ORDER — SUCRALFATE 1 G PO TABS
1.0000 g | ORAL_TABLET | Freq: Three times a day (TID) | ORAL | 0 refills | Status: DC
  Filled 2022-09-22: qty 21, 7d supply, fill #0

## 2022-09-22 MED ORDER — TRULICITY 0.75 MG/0.5ML ~~LOC~~ SOAJ: 0.7500 mg | SUBCUTANEOUS | 4 refills | Status: DC

## 2022-09-22 MED ORDER — DAPAGLIFLOZIN PROPANEDIOL 10 MG PO TABS
10.0000 mg | ORAL_TABLET | Freq: Every day | ORAL | 2 refills | Status: DC
  Filled 2022-09-22: qty 30, 30d supply, fill #0

## 2022-09-22 MED ORDER — DIPHENHYDRAMINE HCL 50 MG/ML IJ SOLN
25.0000 mg | Freq: Once | INTRAMUSCULAR | Status: AC
  Administered 2022-09-22: 25 mg via INTRAVENOUS
  Filled 2022-09-22: qty 1

## 2022-09-22 MED ORDER — MAGNESIUM SULFATE 2 GM/50ML IV SOLN
2.0000 g | Freq: Once | INTRAVENOUS | Status: AC
  Administered 2022-09-22: 2 g via INTRAVENOUS
  Filled 2022-09-22: qty 50

## 2022-09-22 NOTE — ED Provider Notes (Signed)
[facility] EMERGENCY DEPARTMENT Provider Note   CSN: [id number] Arrival date & time: 09/22/22  1517     History  Chief Complaint  Patient presents with   Code Stroke    Stanley Stone is a 56 y.o. male.  Patient as above with significant medical history as below, including CVA, DM, GERd, HLD, HTN, homeless who presents to the ED with complaint of stroke like symptoms.  He is a poor historian.  There is concern [employer] patient with aphasia, right-sided weakness and headache.  EMS was called and patient arrived to the ED as a stroke activation.  Initially seen by neurology after airway was cleared by myself.  Patient sent to CT for emergent imaging or evaluation of speech changes, right-sided weakness and headache.     Past Medical History:  Diagnosis Date   Closed fracture dislocation of lumbar spine (HCC) 1987   COPD (chronic obstructive pulmonary disease) (HCC)    COVID-19 virus infection 07/20/2022   CVA (cerebral vascular accident) (HCC) 03/25/2022   Diabetes mellitus without complication (HCC)    GERD (gastroesophageal reflux disease)    High cholesterol    Hypertension     History reviewed. No pertinent surgical history.   The history is provided by the patient. No language interpreter was used.       Home Medications Prior to Admission medications   Medication Sig Start Date End Date Taking? Authorizing Provider  albuterol (VENTOLIN HFA) 108 (90 Base) MCG/ACT inhaler Inhale 2 puffs into the lungs every 6 (six) hours as needed for wheezing or shortness of breath. 01/18/22  Yes [name], MD  amLODipine (NORVASC) 10 MG tablet Take 1 tablet (10 mg total) by mouth daily. Patient taking differently: Take 10 mg by mouth every evening. 09/21/22  Yes [name], MD  aspirin 325 MG tablet Take 1 tablet (325 mg total) by mouth daily. 03/29/22  Yes Lonia Blood, MD  atorvastatin (LIPITOR) 40 MG tablet Take 1 tablet (40 mg total)  by mouth daily. Patient taking differently: Take 40 mg by mouth every evening. 09/08/22  Yes [name], MD  carvedilol (COREG) 25 MG tablet Take 1 tablet (25 mg total) by mouth 2 (two) times daily with a meal. 06/30/22  Yes [name], MD  cyanocobalamin (VITAMIN B12) 1000 MCG tablet Take 1 tablet (1,000 mcg total) by mouth once daily. 06/16/22  Yes [name], MD  dapagliflozin propanediol (FARXIGA) 10 MG TABS tablet Take 1 tablet (10 mg total) by mouth daily before breakfast. 09/22/22  Yes [name], MD  Dulaglutide (TRULICITY) 0.75 MG/0.5ML SOPN Inject 0.75 mg into the skin once a week. Patient taking differently: Inject 0.75 mg into the skin every Friday. 09/22/22  Yes [name], MD  fluticasone-salmeterol (ADVAIR DISKUS) 100-50 MCG/ACT AEPB Inhale 1 puff into the lungs 2 (two) times daily. 04/27/22  Yes [name], MD  Insulin Glargine [facility] KWIKPEN) 100 UNIT/ML Inject 20 Units into the skin daily. Patient taking differently: Inject 20 Units into the skin daily after breakfast. 09/22/22  Yes [name], MD  metFORMIN (GLUCOPHAGE) 500 MG tablet Take 2 tablets (1,000 mg total) by mouth daily with breakfast. 09/15/22  Yes [name], MD  pantoprazole (PROTONIX) 40 MG tablet Take 1 tablet (40 mg total) by mouth once daily. Patient taking differently: Take 40 mg by mouth daily before breakfast. 07/28/22  Yes [name], MD  pantoprazole (PROTONIX) 40 MG tablet Take  1 tablet (40 mg total) by mouth daily. 09/22/22 10/22/22 Yes Tanda Rockers A, DO  sucralfate (CARAFATE) 1 g tablet Take 1 tablet (1 g total) by mouth with breakfast, with lunch, and with evening meal for 7 days. 09/22/22 09/30/22 Yes Tanda Rockers A, DO  thiamine (VITAMIN B1) 100 MG tablet Take 1 tablet (100 mg total) by mouth once daily. 06/16/22  Yes [name], MD  valsartan-hydrochlorothiazide (DIOVAN-HCT) 320-25 MG tablet Take 1 tablet by mouth once daily. 09/22/22  Yes  [name], MD  cyclobenzaprine (FLEXERIL) 10 MG tablet Take 1 tablet (10 mg total) by mouth 3 (three) times daily as needed for muscle spasms. 06/27/22   [name], MD  Insulin Pen Needle (PEN NEEDLES) 31G X 5 MM MISC use as directed in the morning, at noon, in the evening, and at bedtime. 09/01/22   Barrett, Joline Salt, PA-C  polyethylene glycol powder (GLYCOLAX/MIRALAX) 17 GM/SCOOP powder Take 17 g by mouth 2 (two) times daily as needed. Patient not taking: Reported on 09/22/2022 03/31/22   [name], MD      Allergies    Fish allergy, Penicillins, and Shellfish allergy    Review of Systems   Review of Systems  Constitutional:  Negative for chills and fever.  HENT:  Negative for facial swelling and trouble swallowing.   Eyes:  Negative for photophobia and visual disturbance.  Respiratory:  Negative for cough and shortness of breath.   Cardiovascular:  Negative for chest pain and palpitations.  Gastrointestinal:  Negative for abdominal pain, nausea and vomiting.  Endocrine: Negative for polydipsia and polyuria.  Genitourinary:  Negative for difficulty urinating and hematuria.  Musculoskeletal:  Negative for gait problem and joint swelling.  Skin:  Negative for pallor and rash.  Neurological:  Positive for speech difficulty, weakness and headaches. Negative for syncope.  Psychiatric/Behavioral:  Negative for agitation and confusion.     Physical Exam Updated Vital Signs BP (!) 123/99   Pulse 88   Temp 98.3 F (36.8 C) (Oral)   Resp 14   Ht  (1.651 m)   Wt 82.2 kg   SpO2 92%   BMI 30.16 kg/m  Physical Exam Vitals and nursing note reviewed.  Constitutional:      General: He is not in acute distress.    Appearance: He is well-developed. He is not ill-appearing.  HENT:     Head: Normocephalic and atraumatic. No raccoon eyes, Battle's sign, right periorbital erythema or left periorbital erythema.     Right Ear: External ear normal.     Left Ear:  External ear normal.     Mouth/Throat:     Mouth: Mucous membranes are moist.  Eyes:     General: No scleral icterus.    Extraocular Movements: Extraocular movements intact.     Pupils: Pupils are equal, round, and reactive to light.  Cardiovascular:     Rate and Rhythm: Normal rate and regular rhythm.     Pulses: Normal pulses.     Heart sounds: Normal heart sounds.  Pulmonary:     Effort: Pulmonary effort is normal. No respiratory distress.     Breath sounds: Normal breath sounds.  Abdominal:     General: Abdomen is flat.     Palpations: Abdomen is soft.     Tenderness: There is abdominal tenderness. There is no guarding or rebound.    Musculoskeletal:        General: Normal range of motion.     Cervical  back: Normal range of motion.     Right lower leg: No edema.     Left lower leg: No edema.  Skin:    General: Skin is warm and dry.     Capillary Refill: Capillary refill takes less than 2 seconds.  Neurological:     Mental Status: He is alert and oriented to person, place, and time.     GCS: GCS eye subscore is 4. GCS verbal subscore is 5. GCS motor subscore is 6.     Cranial Nerves: Dysarthria present.     Sensory: Sensory deficit present.     Motor: Weakness present.     Coordination: Coordination is intact.     Comments: Variable sensation to RUE RLE intermittent weakness right upper and lower extremities Gait not tested 2/2 pt safety   Psychiatric:        Mood and Affect: Mood normal.        Behavior: Behavior normal.     ED Results / Procedures / Treatments   Labs (all labs ordered are listed, but only abnormal results are displayed) Labs Reviewed  CBC - Abnormal; Notable for the following components:      Result Value   RBC 3.77 (*)    Hemoglobin 11.5 (*)    HCT 33.8 (*)    All other components within normal limits  COMPREHENSIVE METABOLIC PANEL - Abnormal; Notable for the following components:   Potassium 3.2 (*)    BUN 24 (*)    Creatinine, Ser 1.60  (*)    Calcium 8.8 (*)    Total Protein 6.0 (*)    Albumin 3.3 (*)    GFR, Estimated 50 (*)    All other components within normal limits  URINALYSIS, ROUTINE W REFLEX MICROSCOPIC - Abnormal; Notable for the following components:   Color, Urine STRAW (*)    Glucose, UA 150 (*)    Hgb urine dipstick SMALL (*)    All other components within normal limits  I-STAT CHEM 8, ED - Abnormal; Notable for the following components:   Potassium 3.2 (*)    BUN 24 (*)    Creatinine, Ser 1.70 (*)    Glucose, Bld 68 (*)    Calcium, Ion 1.11 (*)    Hemoglobin 11.9 (*)    HCT 35.0 (*)    All other components within normal limits  PROTIME-INR  APTT  DIFFERENTIAL  RAPID URINE DRUG SCREEN, HOSP PERFORMED  CK  TROPONIN I (HIGH SENSITIVITY)  TROPONIN I (HIGH SENSITIVITY)    EKG EKG Interpretation  Date/Time:  Thursday September 22 2022 16:20:28 EST Ventricular Rate:  85 PR Interval:  190 QRS Duration: 113 QT Interval:  388 QTC Calculation: 462 R Axis:   51 Text Interpretation: Sinus rhythm Borderline intraventricular conduction delay similar to prior Confirmed by Tanda Rockers ([id number]) on 09/22/2022 6:04:30 PM  Radiology CT ABDOMEN PELVIS W CONTRAST  Result Date: 09/22/2022 CLINICAL DATA:  Abdominal pain EXAM: CT ABDOMEN AND PELVIS WITH CONTRAST TECHNIQUE: Multidetector CT imaging of the abdomen and pelvis was performed using the standard protocol following bolus administration of intravenous contrast. RADIATION DOSE REDUCTION: This exam was performed according to the departmental dose-optimization program which includes automated exposure control, adjustment of the mA and/or kV according to patient size and/or use of iterative reconstruction technique. CONTRAST:  70mL OMNIPAQUE IOHEXOL 350 MG/ML SOLN COMPARISON:  CT abdomen and pelvis 07/19/2022 FINDINGS: Lower chest: Unchanged calcified pulmonary nodule in the left lung base. Bibasilar atelectasis. There is mild thickening and  soft tissue stranding  around the distal esophagus at the GE junction, new from prior exam (series 3, image 7, 10). Hepatobiliary: Liver has a normal contour. There is no focal liver lesion. The portal veins are contrast opacified. There is no evidence of perihepatic fluid. The gallbladder is fluid-filled without evidence of cholelithiasis. The common bile duct is normal in appearance. Pancreas: There is no evidence of peripancreatic fat stranding. The pancreatic duct is normal in appearance. Spleen: Spleen is normal in size without evidence of focal splenic lesions. Adrenals/Urinary Tract: Redemonstrated is a low-density lesion in the left adrenal gland, previously favored to represent an adrenal adenoma. Unchanged appearance of the right adrenal gland with a smaller low-density lesion, compatible with an adenoma. Bilateral kidneys are normal in size without evidence of hydronephrosis or nephrolithiasis. There are bilateral low-density renal lesions which are too small to accurately characterize. The kidneys otherwise enhance homogeneously. The urinary bladder is fluid-filled and markedly distended. No evidence of focal wall thickening., Stomach/Bowel: The stomach, small bowel, large bowel is normal in caliber. No evidence of bowel obstruction. The appendix is normal in appearance. Vascular/Lymphatic: There is mild atherosclerotic calcification of the infrarenal abdominal aorta. No evidence of retroperitoneal or pelvic lymphadenopathy. No evidence of inguinal lymphadenopathy. Reproductive: Prostate is normal in appearance. No evidence of free fluid in the pelvis. Other: No abdominal wall hernia or abnormality. No abdominopelvic ascites. Musculoskeletal: Unchanged mild anterior body height loss at L2. IMPRESSION: 1. Mild thickening and soft tissue stranding around the distal esophagus at the GE junction, new from prior exam. Correlate with symptoms of esophagitis. 2. Markedly distended urinary bladder. Correlate with patient's ability to  void. Aortic Atherosclerosis (ICD10-I70.0). Electronically Signed   By: Lorenza Cambridge M.D.   On: 09/22/2022 21:10   DG Chest Portable 1 View  Result Date: 09/22/2022 CLINICAL DATA:  , RIGHT-sided weakness, difficulty speaking EXAM: PORTABLE CHEST 1 VIEW COMPARISON:  Portable exam 1729 hours compared to 07/19/2022 FINDINGS: Normal heart size, mediastinal contours, and pulmonary vascularity. New line lungs clear. No pulmonary infiltrate, pleural effusion, or pneumothorax. Calcified granuloma LEFT base unchanged. No acute osseous findings. IMPRESSION: No acute abnormalities. Electronically Signed   By: Ulyses Southward M.D.   On: 09/22/2022 17:51   MR BRAIN WO CONTRAST  Result Date: 09/22/2022 CLINICAL DATA:  Severe hypertension, stroke suspected. EXAM: MRI HEAD WITHOUT CONTRAST TECHNIQUE: Multiplanar, multiecho pulse sequences of the brain and surrounding structures were obtained without intravenous contrast. COMPARISON:  Same-day noncontrast CT head, MR head 03/25/2022 FINDINGS: Brain: There is no acute intracranial hemorrhage, extra-axial fluid collection, or acute infarct Background parenchymal volume is normal. The ventricles are normal in size. Antonisha Waskey-white differentiation is preserved. There are small remote infarcts in the left caudate head/lentiform nucleus, left frontal white matter, and left parieto-occipital white matter. The parieto-occipital white matter infarct is new since 03/25/2022 but nonacute. Patchy FLAIR signal abnormality in the remainder of the supratentorial white matter likely reflects sequela of chronic small-vessel ischemic change. There is no mass lesion.  There is no mass effect or midline shift. Vascular: Normal flow voids. Skull and upper cervical spine: Normal marrow signal. Sinuses/Orbits: The paranasal sinuses are clear. The globes and orbits are unremarkable. Other: None. IMPRESSION: 1. No acute intracranial pathology. 2. Small remote lacunar infarcts, one of which in the left  parieto-occipital white matter is new since the study from 03/25/2022 nonacute, and mild background chronic small-vessel ischemic change. Electronically Signed   By: Lesia Hausen M.D.   On: 09/22/2022 16:29  CT HEAD CODE STROKE WO CONTRAST  Result Date: 09/22/2022 CLINICAL DATA:  Code stroke.  Aphasia. EXAM: CT HEAD WITHOUT CONTRAST TECHNIQUE: Contiguous axial images were obtained from the base of the skull through the vertex without intravenous contrast. RADIATION DOSE REDUCTION: This exam was performed according to the departmental dose-optimization program which includes automated exposure control, adjustment of the mA and/or kV according to patient size and/or use of iterative reconstruction technique. COMPARISON:  Brain MRI 03/25/2022 FINDINGS: Brain: There is no acute intracranial hemorrhage, extra-axial fluid collection, or acute infarct. Background parenchymal volume is normal. There are small remote infarcts in the left caudate head and frontal lobe subcortical white matter superimposed on background mild chronic small-vessel ischemic change. There is a focus of hypodensity in the left parieto-occipital white matter which was not present on the prior study from 03/25/2022 and is suspicious for an interval small infarct, though this does not appear acute. There is no mass lesion.  There is no mass effect or midline shift. Vascular: There is calcification of the bilateral carotid siphons. Skull: Normal. Negative for fracture or focal lesion. Sinuses/Orbits: There is an incompletely imaged retention cyst in the left maxillary sinus the globes and orbits are unremarkable. Other: None. ASPECTS [facility] Stroke Program Early CT Score) - Ganglionic level infarction (caudate, lentiform nuclei, internal capsule, insula, M1-M3 cortex): 7 - Supraganglionic infarction (M4-M6 cortex): 3 Total score (0-10 with 10 being normal): 10 IMPRESSION: 1. No acute intracranial hemorrhage or evolved territorial infarct. 2.  Suspected small interval infarct in the left parieto-occipital lobe since 03/25/2022 though nonacute appearing. These results were paged via AMION at the time of interpretation on 09/22/2022 at 3:37 pm to provider Dr Derry Lory, who verbally acknowledged these results. Electronically Signed   By: Lesia Hausen M.D.   On: 09/22/2022 15:41    Procedures .Critical Care  Performed by: Sloan Leiter, DO Authorized by: Sloan Leiter, DO   Critical care provider statement:    Critical care time (minutes):  30   Critical care time was exclusive of:  Separately billable procedures and treating other patients   Critical care was necessary to treat or prevent imminent or life-threatening deterioration of the following conditions:  CNS failure or compromise   Critical care was time spent personally by me on the following activities:  Development of treatment plan with patient or surrogate, discussions with consultants, evaluation of patient's response to treatment, examination of patient, ordering and review of laboratory studies, ordering and review of radiographic studies, ordering and performing treatments and interventions, pulse oximetry, re-evaluation of patient's condition, review of old charts and obtaining history from patient or surrogate Comments:     Stroke alert, d/w neurology consultant      Medications Ordered in ED Medications  metoCLOPramide (REGLAN) injection 5 mg (5 mg Intravenous Given 09/22/22 1810)  diphenhydrAMINE (BENADRYL) injection 25 mg (25 mg Intravenous Given 09/22/22 1809)  sodium chloride 0.9 % bolus 1,000 mL (0 mLs Intravenous Stopped 09/22/22 1930)  iohexol (OMNIPAQUE) 350 MG/ML injection 70 mL (70 mLs Intravenous Contrast Given 09/22/22 2056)  magnesium sulfate IVPB 2 g 50 mL (0 g Intravenous Stopped 09/22/22 2234)  ketorolac (TORADOL) 15 MG/ML injection 15 mg (15 mg Intravenous Given 09/22/22 2203)  acetaminophen (TYLENOL) tablet 650 mg (650 mg Oral Given 09/22/22 2203)   potassium chloride SA (KLOR-CON M) CR tablet 40 mEq (40 mEq Oral Given 09/22/22 2347)    ED Course/ Medical Decision Making/ A&P Clinical Course as of 09/24/22 0018  Thu Sep 22, 2022  1804 Counseled patient for approximately 3 minutes regarding smoking cessation. Discussed risks of smoking and how they applied and affected their visit here today. Patient not ready to quit at this time, however will follow up with their primary doctor when they are.   CPT code: 40973: intermediate counseling for smoking cessation    [SG]  1841 Pt reports feeling better, his symptoms have improved since onset. HA is still ongoing, will give reglan/benadryl with IVF to see if we can get some improvement. He reports some diff with urination; bladder scan with >800, plan foley. CTAP pending  [SG]  2108 Pt was able to void spontaneously, no need for foley  [SG]  2124 CT with concern for esophagitis  [SG]    Clinical Course User Index [SG] Sloan Leiter, DO                           Medical Decision Making Amount and/or Complexity of Data Reviewed Labs: ordered. Radiology: ordered.  Risk OTC drugs. Prescription drug management.   This patient presents to the ED with chief complaint(s) of strokelike symptoms with pertinent past medical history of prior stroke, homeless, COPD  which further complicates the presenting complaint. The complaint involves an extensive differential diagnosis and also carries with it a high risk of complications and morbidity.    The differential diagnosis includes but not limited to stroke, TIA, psychogenic, intoxication, infectious, metabolic, acs, seizure, intox etc. Serious etiologies were considered.   The initial plan is to stroke alert, CT imaging   Additional history obtained: Additional history obtained from EMS  Records reviewed  prior ED visits, prior labs and imaging  Independent labs interpretation:  The following labs were independently interpreted:  CMP  stable creatinine from his baseline, CBC stable.  Urinalysis without acute infection.  UDS negative.  Negative.  Can negative.  Independent visualization of imaging: - I independently visualized the following imaging with scope of interpretation limited to determining acute life threatening conditions related to emergency care: CT head, CT abdomen pelvis, MRI brain, which revealed CT head nonacute, MRI brain nonacute, CTAP with pos esophagitis, distended bladder. Pt was able to void spontaneously after returning from CT.    Cardiac monitoring was reviewed and interpreted by myself which shows NSR  Treatment and Reassessment: IVF Reglan/benadryl K+ >> improved  Consultation: - Consulted or discussed management/test interpretation w/ external professional: Neurology Dr. Derry Lory >> low suspicion for acute CVA, MRI was negative.  Recommend follow-up with PCP.  Stable for discharge from neurologic perspective.  Consideration for admission or further workup: Admission was considered   Pt is feeling much better, headache has resolved. Pt cleared by neurology.   Ct w/ pos esophagitis, start carafate/ppi; f/u with GI   Workup today is otherwise re-assurring, he is feeling back to his baseline.   The patient improved significantly and was discharged in stable condition. Detailed discussions were had with the patient regarding current findings, and need for close f/u with PCP or on call doctor. The patient has been instructed to return immediately if the symptoms worsen in any way for re-evaluation. Patient verbalized understanding and is in agreement with current care plan. All questions answered prior to discharge.     Social Determinants of health: Counseled patient for approximately 2 minutes regarding smoking cessation. Discussed risks of smoking and how they applied and affected their visit here today. Patient not ready to quit at this time, however will  follow up with their primary  doctor when they are.   CPT code: 1610999406: intermediate counseling for smoking cessation    Social History   Tobacco Use   Smoking status: Every Day    Packs/day: 0.50    Types: Cigarettes   Smokeless tobacco: Never  Vaping Use   Vaping Use: Never used  Substance Use Topics   Alcohol use: Yes   Drug use: Not Currently            Final Clinical Impression(s) / ED Diagnoses Final diagnoses:  Nonintractable headache, unspecified chronicity pattern, unspecified headache type  History of CVA (cerebrovascular accident)  Esophagitis    Rx / DC Orders ED Discharge Orders          Ordered    pantoprazole (PROTONIX) 40 MG tablet  Daily        09/22/22 2306    sucralfate (CARAFATE) 1 g tablet  3 times daily with meals        09/22/22 2306              Sloan LeiterGray, Adiel Mcnamara A, DO 09/24/22 0018

## 2022-09-22 NOTE — Congregational Nurse Program (Signed)
  Dept: [phone]   Congregational Nurse Program Note  Date of Encounter: 09/14/2022  Past Medical History: Past Medical History:  Diagnosis Date   Closed fracture dislocation of lumbar spine (HCC) 1987   COPD (chronic obstructive pulmonary disease) (HCC)    COVID-19 virus infection 07/20/2022   CVA (cerebral vascular accident) (HCC) 03/25/2022   Diabetes mellitus without complication (HCC)    GERD (gastroesophageal reflux disease)    High cholesterol    Hypertension     Encounter Details:  CNP Questionnaire - 09/14/22 1043       Questionnaire   Ask client: Do you give verbal consent for me to treat you today? Yes    Student Assistance [employer]    Location Patient Served  [facility]    Visit Setting with Client Organization    Patient Status Unhoused    Insurance Medicare    Insurance/Financial Assistance Referral N/A    Medication --    Medical Provider --            wrong patient  De Hollingshead, RN [phone]

## 2022-09-22 NOTE — Discharge Instructions (Addendum)
It was a pleasure caring for you today in the emergency department.  Please follow up with your pcp   Please consider tobacco cessation  Please return to the emergency department for any worsening or worrisome symptoms.

## 2022-09-22 NOTE — Code Documentation (Signed)
Stroke Response Nurse Documentation Code Documentation  Barre Aydelott is a 56 y.o. male arriving to [facility]  via [facility] on 09/22/2022 with past medical hx of hypertension, COPD, GERD, diabetes, high cholesterol, stroke 03/2022, and Covid 07/2022. On No antithrombotic. Code stroke was activated by EMS.   Patient is homeless with [employer] where he was LKW at 1400 and now complaining of headache, worsened aphasia and right sided weakness. Baseline bilaterally legally blind, right sided weakness and delayed speech.   Stroke team at the bedside on patient arrival. Labs drawn and patient cleared for CT by EDP. Patient to CT with team. NIHSS 15, see documentation for details and code stroke times. Patient with bilateral arm weakness, bilateral leg weakness, right decreased sensation, Expressive aphasia , and Sensory  neglect on exam. The following imaging was completed:  CT Head and MRI. Patient is not a candidate for IV Thrombolytic due to code stroke canceled based on MRI results  Bedside handoff with ED RN.    Ferman Hamming Stroke Response RN

## 2022-09-22 NOTE — Progress Notes (Signed)
This is a 56 year old male primary care patient of mine seen at the [place] [facility].  He now has full Social Security disability will receive $960 a month.  He is yet to achieve housing.  Blood sugars recently been 125 130 range highest in the 140s range she did get 1 value of 62 when he does not eat breakfast or dinner.  He is on the insulin Basaglar 20 units daily.  He also takes [place] 10 mg daily Trulicity 0.75 mg weekly and metformin 1000 mg daily  The patient does not need any refills at this time with exception of a new prescription for Trulicity and insulin Basaglar  Patient has follow-up appointment with me in December.  I am also going to change his amlodipine to a single 10 mg tablet daily from two 5 mg tablets daily  Note today blood pressure 109/71  Patient will also get a neurosurgery referral for cervical spine disease

## 2022-09-22 NOTE — ED Notes (Signed)
Received verbal report from Cori C RN at this time 

## 2022-09-22 NOTE — ED Notes (Signed)
Pt not in room at this time to complete assessment at this time

## 2022-09-22 NOTE — ED Triage Notes (Signed)
Pt arrived via ems from homeless shelter where he was observed by volunteer to have increased right side weakness and increased difficulty speaking. Code Stroke activated upon arrival.

## 2022-09-22 NOTE — ED Notes (Signed)
Returned to room at this time.

## 2022-09-22 NOTE — Congregational Nurse Program (Signed)
Pt comes in crying c/o R side pain and numbness x , states just sitting talking to friends, and all of a sudden, vision blurry, h/a- EMS called, appr notice of S/S appr 1430. Bp 134/74 HR 90 Resp 20 02 sat 96% CBG 80 When EMS arrived speech had changed negatively, vision more blurry, speech becoming garbled, he was aware he was with RN but not date, time, Economist. Transported via Doctor, general practice , EMS to [facility] Dr Delford Field notified

## 2022-09-22 NOTE — Consult Note (Signed)
Neurology Consultation  Reason for Consult: Code Stroke Referring Physician: Wallace Cullens  CC: aphasia  History is obtained from:Chart review, patient, ems  HPI: Stanley Stone is a 56 y.o. male with a past medical history of previous stroke, COPD, smoker, HTN, HLD, and DM presenting from [facility] with a new onset of aphasia, right sided weakness, and headache. He was seen at [facility] by an RN who noticed a change in his speech and the Stanley Stone reported a headache and right extremity weakness to her. EMS was called. Upon his arrival to the ED he has expressive aphasia and dysarthric speech with fluctuating R sided weakness.  LKW: 1400 IV thrombolysis given?: no, stroke not suspected Premorbid modified Rankin scale (mRS):  2-Slight disability-UNABLE to perform all activities but does not need assistance    ROS: Unable to obtain due to altered mental status.   Past Medical History:  Diagnosis Date   Closed fracture dislocation of lumbar spine (HCC) 1987   COPD (chronic obstructive pulmonary disease) (HCC)    COVID-19 virus infection 07/20/2022   CVA (cerebral vascular accident) (HCC) 03/25/2022   Diabetes mellitus without complication (HCC)    GERD (gastroesophageal reflux disease)    High cholesterol    Hypertension     History reviewed. No pertinent family history.  Social History:   reports that he has been smoking cigarettes. He has been smoking an average of .5 packs per day. He has never used smokeless tobacco. He reports current alcohol use. He reports that he does not currently use drugs.  Medications  Current Facility-Administered Medications:    LORazepam (ATIVAN) 2 MG/ML injection, , , ,   Current Outpatient Medications:    albuterol (VENTOLIN HFA) 108 (90 Base) MCG/ACT inhaler, Inhale 2 puffs into the lungs every 6 (six) hours as needed for wheezing or shortness of breath., Disp: 18 g, Rfl: 0   amLODipine (NORVASC) 10 MG tablet, Take 1 tablet (10 mg total)  by mouth daily., Disp: 90 tablet, Rfl: 2   aspirin 325 MG tablet, Take 1 tablet (325 mg total) by mouth daily., Disp: 30 tablet, Rfl: 2   atorvastatin (LIPITOR) 40 MG tablet, Take 1 tablet (40 mg total) by mouth daily., Disp: 30 tablet, Rfl: 2   carvedilol (COREG) 25 MG tablet, Take 1 tablet (25 mg total) by mouth 2 (two) times daily with a meal., Disp: 60 tablet, Rfl: 3   cyanocobalamin (VITAMIN B12) 1000 MCG tablet, Take 1 tablet (1,000 mcg total) by mouth once daily., Disp: 100 tablet, Rfl: 0   cyclobenzaprine (FLEXERIL) 10 MG tablet, Take 1 tablet (10 mg total) by mouth 3 (three) times daily as needed for muscle spasms., Disp: 90 tablet, Rfl: 0   dapagliflozin propanediol (FARXIGA) 10 MG TABS tablet, Take 1 tablet (10 mg total) by mouth daily before breakfast., Disp: 30 tablet, Rfl: 2   Dulaglutide (TRULICITY) 0.75 MG/0.5ML SOPN, Inject 0.75 mg into the skin once a week., Disp: 2 mL, Rfl: 4   fluticasone-salmeterol (ADVAIR DISKUS) 100-50 MCG/ACT AEPB, Inhale 1 puff into the lungs 2 (two) times daily., Disp: 60 each, Rfl: 2   Insulin Glargine (BASAGLAR KWIKPEN) 100 UNIT/ML, Inject 20 Units into the skin daily., Disp: 3 mL, Rfl: 2   Insulin Pen Needle (PEN NEEDLES) 31G X 5 MM MISC, use as directed in the morning, at noon, in the evening, and at bedtime., Disp: 130 each, Rfl: 1   metFORMIN (GLUCOPHAGE) 500 MG tablet, Take 2 tablets (1,000 mg total) by mouth daily  with breakfast., Disp: 120 tablet, Rfl: 1   pantoprazole (PROTONIX) 40 MG tablet, Take 1 tablet (40 mg total) by mouth once daily., Disp: 30 tablet, Rfl: 3   polyethylene glycol powder (GLYCOLAX/MIRALAX) 17 GM/SCOOP powder, Take 17 g by mouth 2 (two) times daily as needed. (Patient taking differently: Take 17 g by mouth 2 (two) times daily as needed for mild constipation.), Disp: 1530 g, Rfl: 1   thiamine (VITAMIN B1) 100 MG tablet, Take 1 tablet (100 mg total) by mouth once daily., Disp: 100 tablet, Rfl: 0   valsartan-hydrochlorothiazide  (DIOVAN-HCT) 320-25 MG tablet, Take 1 tablet by mouth once daily., Disp: 60 tablet, Rfl: 3   Exam: Current vital signs: Wt 82.2 kg   BMI 30.16 kg/m  Vital signs in last 24 hours: Pulse Rate:  [95] 95 (11/09 1433) Resp:  [20] 20 (11/09 1433) BP: (138)/(76) 138/76 (11/09 1433) SpO2:  [100 %] 100 % (11/09 1433) Weight:  [82.2 kg] 82.2 kg (11/09 1500)  GENERAL: Awake, alert in NAD HEENT: - Normocephalic and atraumatic, dry mm, no LN++, no Thyromegally LUNGS - Clear to auscultation bilaterally with no wheezes CV - S1S2 RRR, no m/r/g, equal pulses bilaterally. ABDOMEN - Soft, nontender, nondistended with normoactive BS Ext: warm, well perfused, intact peripheral pulses, no edema  NEURO:  Mental Status: Awake, alert, able to state name and follow commands Language: Moderate expressive aphasia.   Cranial Nerves: PERRL, legally blind in both eyes, able to make out movement but not specific features, no facial asymmetry, facial sensation intact, hearing intact, tongue/uvula/soft palate midline, normal sternocleidomastoid and trapezius muscle strength. No evidence of tongue atrophy or fasciculations  Motor: effort dependent- strength and tone fluctuate throughout testing  RUE 3/5 LUE 4/5 RLE 1/5 LLE 4/5 Tone is normal and bulk is normal Sensation- Intact to light touch bilaterally Coordination: FTN intact bilaterally, no ataxia in BLE. Gait- deferred  NIHSS 1a Level of Conscious.: 0 1b LOC Questions: 0 1c LOC Commands: 0 2 Best Gaze: 0 3 Visual: 0 4 Facial Palsy: 0 5a Motor Arm - left: 2 5b Motor Arm - Right: 3 6a Motor Leg - Left: 2 6b Motor Leg - Right: 4 7 Limb Ataxia: 0 8 Sensory: 2 9 Best Language: 1 10 Dysarthria: 0 11 Extinct. and Inatten.: 1 TOTAL: 15   Labs I have reviewed labs in epic and the results pertinent to this consultation are:  CBC    Component Value Date/Time   WBC 10.0 08/03/2022 1035   WBC 7.2 07/22/2022 0841   RBC 5.01 08/03/2022 1035   RBC  4.49 07/22/2022 0841   HGB 15.0 08/03/2022 1035   HCT 43.9 08/03/2022 1035   PLT 308 08/03/2022 1035   MCV 88 08/03/2022 1035   MCH 29.9 08/03/2022 1035   MCH 29.8 07/22/2022 0841   MCHC 34.2 08/03/2022 1035   MCHC 33.7 07/22/2022 0841   RDW 13.2 08/03/2022 1035   LYMPHSABS 2.7 08/03/2022 1035   MONOABS 0.8 07/20/2022 0220   EOSABS 0.2 08/03/2022 1035   BASOSABS 0.0 08/03/2022 1035    CMP     Component Value Date/Time   NA 140 08/03/2022 1035   K 4.7 08/03/2022 1035   CL 98 08/03/2022 1035   CO2 22 08/03/2022 1035   GLUCOSE 94 08/03/2022 1035   GLUCOSE 152 (H) 07/22/2022 0841   BUN 39 (H) 08/03/2022 1035   CREATININE 2.00 (H) 08/03/2022 1035   CALCIUM 9.9 08/03/2022 1035   PROT 7.0 08/03/2022 1035   ALBUMIN  4.2 08/03/2022 1035   AST 18 08/03/2022 1035   ALT 32 08/03/2022 1035   ALKPHOS 101 08/03/2022 1035   BILITOT 0.7 08/03/2022 1035   GFRNONAA 58 (L) 07/22/2022 0841   GFRAA 82 02/07/2019 1621    Lipid Panel     Component Value Date/Time   CHOL 159 06/27/2022 1003   TRIG 319 (H) 06/27/2022 1003   HDL 39 (L) 06/27/2022 1003   CHOLHDL 4.1 06/27/2022 1003   CHOLHDL 4.3 03/26/2022 1043   VLDL 41 (H) 03/26/2022 1043   LDLCALC 69 06/27/2022 1003     Imaging I have reviewed the images obtained:  CT-head 1. No acute intracranial hemorrhage or evolved territorial infarct. 2. Suspected small interval infarct in the left parieto-occipital lobe since 03/25/2022 though nonacute appearing.  MRI examination of the brain- no acute infarct- official read pending  Assessment:  56 y.o. male with a past medical history of previous stroke, COPD, smoker, HTN, HLD, and DM presenting from [facility] with a new onset of aphasia, right sided weakness, and headache. He was seen at [facility] by an RN who noticed a change in his speech and the Stanley Stone reported a headache and right extremity weakness. Exam suspicious for functional overlay with waxing and waning  aphasia, giveaway weakness noted in right upper extremity right lower extremity and dysarthric speech which would improve as he was distracted and conversing.  Given his risk factors for stroke including smoking, known significant intracranial atherosclerotic disease, we do not want to discount the possibility of acute stroke but also wanted to be sure before we give him TNKase.  We therefore made a decision to proceed with an MRI which was negative for an acute infarct.  Given negative MRI, plan to treat his headache and optimize his stroke risk factors. Recommend he follows up with his PCP if he continues to have severe headaches after discharge. We do additionally recommend avoiding hypotension due to his severe arthrosclerosis on his previous MRA.   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 MD to update note as needed.   Elmer Picker, DNP, FNP-BC [facility] Pager: [phone]  NEUROHOSPITALIST ADDENDUM Performed a face to face diagnostic evaluation.   I have reviewed the contents of history and physical exam as documented by PA/ARNP/Resident and agree with above documentation.  I have discussed and formulated the above plan as documented. Edits to the note have been made as needed.  Impression/Key exam findings/Plan: 56 year old male with history of lacunar infarcts, COPD, current smoker, hypertension, hyperlipidemia and diabetes who presented from [facility] with waxing and waning aphasia, right-sided weakness and fluctuating dysarthric speech. Exam suspicious for functional overlay with waxing and waning aphasia, giveaway weakness noted in right upper extremity right lower extremity and dysarthric speech which  would improve as he was distracted and conversing.  Given his risk factors for stroke including smoking, poorly controlled DM 2, HTN, HLD, known significant intracranial atherosclerotic disease, we do not want to discount the possibility of acute stroke but also wanted to be sure before we give him TNKase.  We therefore made a decision to proceed with an MRI which was negative for an acute infarct.  Given no stroke on MRI, TNKase was not offered.  Likely etiology of  his presentation today is functional neurological disorder with weakness.  Alternatively, he was hypertensive to 90s by 60s and given his significant iCAD, that could have possibly caused fluctuating symptoms.  With that said, exam was still most consistent with a functional neurological weakness.  Recommend migraine cocktail. We will signoff.  Erick Blinks, MD [facility] [phone]   If 7pm to 7am, please call on call as listed on AMION.

## 2022-09-23 ENCOUNTER — Other Ambulatory Visit: Payer: Self-pay

## 2022-09-27 NOTE — Congregational Nurse Program (Signed)
  Dept: [phone]   Congregational Nurse Program Note  Date of Encounter: 09/27/2022  Clinic visit to discuss medications and food issues related to diabetes and allergies. States that his blood sugar was 122 this am before breakfast, declined to have vital signs taken today.  Missed taking insulin several days but took it today. To see Nurse [name] on Wednesday for a bus pass to be able to use SNAP benefit to purchase food. Past Medical History: Past Medical History:  Diagnosis Date   Closed fracture dislocation of lumbar spine (HCC) 1987   COPD (chronic obstructive pulmonary disease) (HCC)    COVID-19 virus infection 07/20/2022   CVA (cerebral vascular accident) (HCC) 03/25/2022   Diabetes mellitus without complication (HCC)    GERD (gastroesophageal reflux disease)    High cholesterol    Hypertension     Encounter Details:  CNP Questionnaire - 09/27/22 0930       Questionnaire   Ask client: Do you give verbal consent for me to treat you today? Yes    Student Assistance N/A    Location Patient Served  [facility]    Visit Setting with Client Organization    Patient Status Unhoused    Insurance Uninsured ([facility] [employer]/Self-Pay/Medicaid Family Planning)    Insurance/Financial Assistance Referral Medicaid    Medication Have Medication Insecurities    Medical Provider Yes    Screening Referrals Made N/A    Medical Referrals Made N/A    Medical Appointment Made N/A    Recently w/o PCP, now 1st time PCP visit completed due to CNs referral or appointment made N/A    Food Have Food Insecurities    Transportation Need transportation assistance    Housing/Utilities No permanent housing    Interpersonal Safety Do not feel safe at current residence    Interventions Advocate/Support;Navigate Healthcare System;Counsel;Educate    Abnormal to Normal Screening Since Last CN Visit N/A    Screenings CN Performed N/A    Sent Client to Lab for: N/A    Did client attend any of  the following based off CNs referral or appointments made? N/A    ED Visit Averted N/A    Life-Saving Intervention Made N/A

## 2022-09-29 ENCOUNTER — Other Ambulatory Visit: Payer: Self-pay

## 2022-09-29 ENCOUNTER — Other Ambulatory Visit: Payer: Self-pay | Admitting: *Deleted

## 2022-09-29 MED ORDER — SUCRALFATE 1 G PO TABS
1.0000 g | ORAL_TABLET | Freq: Three times a day (TID) | ORAL | 0 refills | Status: DC
  Filled 2022-09-29: qty 21, 7d supply, fill #0

## 2022-09-29 MED ORDER — VITAMIN B-12 1000 MCG PO TABS
1000.0000 ug | ORAL_TABLET | Freq: Every day | ORAL | 0 refills | Status: DC
  Filled 2022-09-29: qty 100, 100d supply, fill #0

## 2022-09-29 MED ORDER — VALSARTAN-HYDROCHLOROTHIAZIDE 320-25 MG PO TABS
1.0000 | ORAL_TABLET | Freq: Every day | ORAL | 3 refills | Status: DC
  Filled 2022-09-29: qty 30, 30d supply, fill #0

## 2022-09-29 MED ORDER — THIAMINE HCL 100 MG PO TABS
100.0000 mg | ORAL_TABLET | Freq: Every day | ORAL | 0 refills | Status: DC
  Filled 2022-09-29: qty 100, 100d supply, fill #0

## 2022-09-29 MED ORDER — VALSARTAN-HYDROCHLOROTHIAZIDE 320-25 MG PO TABS
1.0000 | ORAL_TABLET | Freq: Every day | ORAL | 3 refills | Status: DC
  Filled 2022-09-29: qty 60, 60d supply, fill #0

## 2022-09-29 MED ORDER — VITAMIN B-12 1000 MCG PO TABS
1000.0000 ug | ORAL_TABLET | Freq: Every day | ORAL | 0 refills | Status: DC
  Filled 2022-09-29 – 2022-12-31 (×2): qty 130, 130d supply, fill #0

## 2022-09-29 MED ORDER — DAPAGLIFLOZIN PROPANEDIOL 10 MG PO TABS
10.0000 mg | ORAL_TABLET | Freq: Every day | ORAL | 2 refills | Status: DC
  Filled 2022-09-29: qty 30, 30d supply, fill #0

## 2022-09-29 MED ORDER — THIAMINE HCL 100 MG PO TABS
100.0000 mg | ORAL_TABLET | Freq: Every day | ORAL | 0 refills | Status: DC
  Filled 2022-09-29 – 2022-12-31 (×2): qty 100, 100d supply, fill #0

## 2022-10-03 ENCOUNTER — Telehealth: Payer: Self-pay | Admitting: Critical Care Medicine

## 2022-10-03 NOTE — Telephone Encounter (Signed)
Stanley Stone w/ [facility] needing clarification on why pt is being referred   Phone [phone] ext [phone]

## 2022-10-03 NOTE — Telephone Encounter (Signed)
This message below was read/ given to [facility] with the Neurosurgeon specialty. Message below is from progress note of 9/202/2023 by Dr. Delford Field. Please clarify if patient needs neurology or neurosurgery. If neurosurgery specify need. If neurology please change referral order in chart. Office that referral was sent to is Neurosurgery.    Tests Needing Follow-up: -needs outpatient NCS/EMG as recommended by stroke team - this should be arranged through outpatient follow-up with neurology -avoid overcorrection of BP due to CVD and the need to assure adequate brain perfusion   -B12 should be rechecked in 8-12 weeks to assure he is able to absorb it orally

## 2022-10-04 NOTE — Telephone Encounter (Signed)
Arna Medici let neurosurgeon know this was sent in error and cancel order

## 2022-10-05 ENCOUNTER — Other Ambulatory Visit: Payer: Self-pay

## 2022-10-18 ENCOUNTER — Other Ambulatory Visit (HOSPITAL_COMMUNITY): Payer: Self-pay

## 2022-10-18 ENCOUNTER — Other Ambulatory Visit: Payer: Self-pay

## 2022-10-27 ENCOUNTER — Encounter: Payer: Self-pay | Admitting: Critical Care Medicine

## 2022-10-27 ENCOUNTER — Ambulatory Visit: Payer: Medicaid Other | Attending: Critical Care Medicine | Admitting: Critical Care Medicine

## 2022-10-27 VITALS — BP 128/78 | HR 93 | Ht 65.0 in | Wt 181.0 lb

## 2022-10-27 DIAGNOSIS — F17210 Nicotine dependence, cigarettes, uncomplicated: Secondary | ICD-10-CM | POA: Insufficient documentation

## 2022-10-27 DIAGNOSIS — Z760 Encounter for issue of repeat prescription: Secondary | ICD-10-CM | POA: Insufficient documentation

## 2022-10-27 DIAGNOSIS — N179 Acute kidney failure, unspecified: Secondary | ICD-10-CM | POA: Diagnosis not present

## 2022-10-27 DIAGNOSIS — E119 Type 2 diabetes mellitus without complications: Secondary | ICD-10-CM | POA: Insufficient documentation

## 2022-10-27 DIAGNOSIS — Z720 Tobacco use: Secondary | ICD-10-CM

## 2022-10-27 DIAGNOSIS — E1165 Type 2 diabetes mellitus with hyperglycemia: Secondary | ICD-10-CM

## 2022-10-27 DIAGNOSIS — E1169 Type 2 diabetes mellitus with other specified complication: Secondary | ICD-10-CM | POA: Diagnosis not present

## 2022-10-27 DIAGNOSIS — J4489 Other specified chronic obstructive pulmonary disease: Secondary | ICD-10-CM | POA: Insufficient documentation

## 2022-10-27 DIAGNOSIS — K029 Dental caries, unspecified: Secondary | ICD-10-CM

## 2022-10-27 DIAGNOSIS — Z794 Long term (current) use of insulin: Secondary | ICD-10-CM

## 2022-10-27 DIAGNOSIS — Z5900 Homelessness unspecified: Secondary | ICD-10-CM

## 2022-10-27 DIAGNOSIS — I10 Essential (primary) hypertension: Secondary | ICD-10-CM | POA: Insufficient documentation

## 2022-10-27 DIAGNOSIS — Z79899 Other long term (current) drug therapy: Secondary | ICD-10-CM | POA: Insufficient documentation

## 2022-10-27 DIAGNOSIS — E785 Hyperlipidemia, unspecified: Secondary | ICD-10-CM

## 2022-10-27 LAB — POCT GLYCOSYLATED HEMOGLOBIN (HGB A1C): HbA1c, POC (controlled diabetic range): 6.5 % (ref 0.0–7.0)

## 2022-10-27 MED ORDER — BASAGLAR KWIKPEN 100 UNIT/ML ~~LOC~~ SOPN: 20.0000 [IU] | PEN_INJECTOR | Freq: Every day | SUBCUTANEOUS | 2 refills | Status: DC

## 2022-10-27 MED ORDER — DAPAGLIFLOZIN PROPANEDIOL 10 MG PO TABS
10.0000 mg | ORAL_TABLET | Freq: Every day | ORAL | 2 refills | Status: DC
  Filled 2022-11-01: qty 90, 90d supply, fill #0
  Filled 2022-12-31 – 2023-01-02 (×2): qty 30, 30d supply, fill #0
  Filled 2023-02-13: qty 30, 30d supply, fill #1

## 2022-10-27 MED ORDER — ALBUTEROL SULFATE HFA 108 (90 BASE) MCG/ACT IN AERS: 2.0000 | INHALATION_SPRAY | Freq: Four times a day (QID) | RESPIRATORY_TRACT | 0 refills | Status: DC | PRN

## 2022-10-27 MED ORDER — CARVEDILOL 25 MG PO TABS: 25.0000 mg | ORAL_TABLET | Freq: Two times a day (BID) | ORAL | 3 refills | Status: DC

## 2022-10-27 MED ORDER — CYCLOBENZAPRINE HCL 10 MG PO TABS: 10.0000 mg | ORAL_TABLET | Freq: Three times a day (TID) | ORAL | 1 refills | Status: DC | PRN

## 2022-10-27 MED ORDER — ATORVASTATIN CALCIUM 40 MG PO TABS: 40.0000 mg | ORAL_TABLET | Freq: Every evening | ORAL | 2 refills | Status: DC

## 2022-10-27 MED ORDER — VALSARTAN-HYDROCHLOROTHIAZIDE 320-25 MG PO TABS: 1.0000 | ORAL_TABLET | Freq: Every day | ORAL | 3 refills | Status: DC

## 2022-10-27 MED ORDER — FLUTICASONE-SALMETEROL 100-50 MCG/ACT IN AEPB: 1.0000 | INHALATION_SPRAY | Freq: Two times a day (BID) | RESPIRATORY_TRACT | 2 refills | Status: DC

## 2022-10-27 MED ORDER — TRULICITY 0.75 MG/0.5ML ~~LOC~~ SOAJ: 0.7500 mg | SUBCUTANEOUS | 4 refills | Status: DC

## 2022-10-27 MED ORDER — AMLODIPINE BESYLATE 10 MG PO TABS: 10.0000 mg | ORAL_TABLET | Freq: Every day | ORAL | 2 refills | Status: DC

## 2022-10-27 NOTE — Assessment & Plan Note (Signed)
Continue with statin therapy.  ?

## 2022-10-27 NOTE — Assessment & Plan Note (Signed)
    Current smoking consumption amount:  10 cigarettes a day  Dicsussion on advise to quit smoking and smoking impacts: Lung cardiovascular impacts  Patient's willingness to quit: Willing to quit  Methods to quit smoking discussed: Behavioral modification  Medication management of smoking session drugs discussed: Nicotine replacement  Resources provided:  AVS   Setting quit date not established  Follow-up arranged 4 months   Time spent counseling the patient: 5 minutes

## 2022-10-27 NOTE — Assessment & Plan Note (Signed)
No change in current medications refill given continue with metformin 1000 mg daily Basaglar insulin 20 units daily Farxiga 10 mg daily

## 2022-10-27 NOTE — Progress Notes (Signed)
New Patient Office Visit  Subjective:  Patient ID: Stanley Stone, male    DOB: 1966/04/20  Age: 56 y.o. MRN: [id number]  CC:  [facility] visit medication refills  HPI 01/18/22 Stanley Stone presents for to establish care.  I have not seen this patient since March 2020.  This patient is homeless he lives in an abandoned house in [place].  There is no electricity or heat.  He is sleeping on the floor.  Patient has severe hypertension and type 2 diabetes.  On arrival blood pressure 180/105.  He was given prescription last week for Zestoretic along with metformin and atorvastatin.  Patient's been having significant dizziness emesis epigastric abdominal pain some blood intermixed in the emesis.  He drinks about 1 beer a day.  If he stops drinking beer he feels he might go into delirium tremens.  He has a history of COPD and does have a chronic cough with some wheezing.  On arrival A1c is 9.1 blood sugar is 232.  He does not have a meter currently to measure his blood sugar.  He eats a lot of carbohydrates because he gets food from charts of food pantry's that have largely carbohydrate-based meals.  Patient does note shortness of breath with exertion.  He does not have any rectal bleeding.  He notes some numbness in the hands and in the feet.  04/05/22 This patient was seen last week at the [facility] today's visit is a posthospital visit for medication refills. Below is documentation from the last visit Seen 5/17 at [facility]: This is a transition of care visit which occurred in the [facility].  This is a 56 year old male primary care patient of mine who is seen post hospital for recent left MCA stroke.  He was just discharged this week.  Below is a copy of the discharge summary   Date of Admission: 03/25/2022 Date of Discharge: 03/28/2022   Attending Physician:Jeffrey Hennie Duos, MD   Patient's LTR:VUYEBX, Stanley Harry, MD   Consults: Neurology   Disposition:  Discharge home   Follow-up Appts:   Follow-up Information       [name], MD Follow up.   Specialty: Pulmonary Disease Contact information: [address] [place] [address] [phone]              [facility]. Schedule an appointment as soon as possible for a visit in 1 month(s).   Specialty: Neurology Contact information: [address] [place] [phone]                        Tests Needing Follow-up: -needs outpatient NCS/EMG as recommended by stroke team - this should be arranged through outpatient follow-up with neurology -avoid overcorrection of BP due to CVD and the need to assure adequate brain perfusion   -B12 should be rechecked in 8-12 weeks to assure he is able to absorb it orally    Discharge Diagnoses: Left brain hypoperfusion versus TIA Left MCA occlusion/large vessel disease R arm weakness 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 with right foraminal impingement and right ventral cord indentation Vitamin B12 deficiency Hypokalemia DM2 COPD with chronic bronchitis  Tobacco use Homelessness History of alcohol abuse HTN   Initial presentation: 56yo homeless gentleman with a history of poor visual acuity (blind in right eye, 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  Since the initial onset it has waxed and waned in an unpredictable fashion.   Hospital Course:   Left brain hypoperfusion versus TIA due to left MCA occlusion/large vessel disease - R arm weakness -CT head without acute findings -MRI brain negative for acute/subacute CVA -mild chronic small vessel ischemic disease noted with cerebral and cerebellar atrophy -MRa head notes severe intracranial atherosclerosis with a left MCA occlusion -MRa neck negative -MRI C-spine notes cervical spine degeneration  most notable at C4-5 with right foraminal impingement and right ventral cord indentation -TTE notes EF 60-65% with normal LV function and no WMA with grade 1 DD and no intracardiac source of embolism appreciable -LDL 76 -continue Lipitor without change -A1c 8.2 Neurology recommending ASA 325 mg plus Plavix for 3 months then ASA alone, and avoidance of hypotension   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 with right foraminal impingement and right ventral cord indentation [name] - Neurology recommending outpatient EMGs/NCS -symptoms have spontaneously resolved during his hospital stay   Vitamin B12 deficiency Vitamin B12 quite low at 228 - supplemented with subcutaneous load while inpatient, and oral replacement after discharge - B12 should be rechecked in 8-12 weeks to assure he is able to absorb it orally    Hypokalemia Likely due to poor nutritional status -corrected with supplementation   DM2 A1c 8.2 -CBG well controlled as inpatient   COPD with chronic bronchitis  No acute exacerbation   Tobacco use Discussed need for absolute tobacco cessation with patient -utilized nicotine patch to assist w/ cessation    Homelessness Chronically homeless. Pt lives in a tent. TOC/CSW assisted with maximizing outpatient f/u and resources. All new meds provided to patient at bedside from [facility] prior to d/c home.    History of alcohol abuse Pt has not had any alcohol in 5 days -no evidence of withdrawal during this admission - encouraged continued abstinence    HTN Avoid overcorrection/hypotension in setting of significant intracranial vascular disease       This patient had been living in a tent in [facility] he now is in the [place] thanks to [employer] [facility] [employer].  The patient was discharged with out his insulin and also without any aspirin.  He did receive 3 transition of care pharmacy all of his  other medications.   Med list is noted below       Outpatient Encounter Medications as of 03/30/2022  Medication Sig   albuterol (VENTOLIN HFA) 108 (90 Base) MCG/ACT inhaler Inhale 2 puffs into the lungs every 6 (six) hours as needed for wheezing or shortness of breath.   amLODipine (NORVASC) 10 MG tablet Take 1 tablet (10 mg total) by mouth daily.   aspirin 325 MG tablet Take 1 tablet (325 mg total) by mouth daily.   atorvastatin (LIPITOR) 40 MG tablet Take 1 tablet (40 mg total) by mouth daily.   Blood Glucose Monitoring Suppl (TRUE METRIX METER) w/Device KIT Use as directed daily to check blood sugar   carvedilol (COREG) 25 MG tablet Take 1 tablet (25 mg total) by mouth 2 (two) times daily with a meal.   clopidogrel (PLAVIX) 75 MG tablet Take 1 tablet (75 mg total) by mouth daily.   fluticasone-salmeterol (ADVAIR HFA) 115-21 MCG/ACT inhaler Inhale 2 puffs into the lungs 2 (two) times daily.   glucose blood (TRUE METRIX BLOOD GLUCOSE TEST) test strip  Use as instructed   Insulin Glargine (BASAGLAR KWIKPEN) 100 UNIT/ML Inject 10 Units into the skin daily.   Insulin Pen Needle (UNIFINE PENTIPS) 31G X 8 MM MISC Use with insulin pen   lisinopril-hydrochlorothiazide (ZESTORETIC) 20-25 MG tablet Take 1 tablet by mouth daily.   metFORMIN (GLUCOPHAGE) 500 MG tablet Take 1 tablet (500 mg total) by mouth 2 (two) times daily with a meal. (Patient not taking: Reported on 03/25/2022)   nicotine (NICODERM CQ - DOSED IN MG/24 HOURS) 21 mg/24hr patch Place 1 patch (21 mg total) onto the skin daily.   pantoprazole (PROTONIX) 40 MG tablet Take 1 tablet (40 mg total) by mouth daily.   thiamine 100 MG tablet Take 1 tablet (100 mg total) by mouth daily.   traMADol (ULTRAM) 50 MG tablet Take 1 tablet (50 mg total) by mouth every 6 (six) hours as needed for moderate pain.   TRUEplus Lancets 28G MISC Check blood sugar daily   vitamin B-12 (CYANOCOBALAMIN) 1000 MCG tablet Take 1 tablet (1,000 mcg total) by mouth  daily.    No facility-administered encounter medications on file as of 03/30/2022.    Patient is still struggling with partial aphasia and left-sided weakness.  He was not established with any physical therapy or speech therapy post hospital as he is uninsured.   On exam blood pressure is elevated 165/81 saturation 97% pulse 86 patient demonstrates aphasia he has right-sided upper extremity weakness Chest is clear Cardiac exam unremarkable   Impression is that of recent MCA stroke you will need access to his insulin products he will need post hospital follow-up in my clinic as soon as possible we will establish this  We did give him samples of 325 mg aspirin all his other medications are accessible and have been reconciled  We will endeavor to see if we can get this patient physical therapy and speech therapy through some type of grant program as he is uninsured    Today in the clinic the patient has difficulty with memory still has trouble with ambulation using a rollator has a follow-up appointment June 6 at rehab for physical therapy and speech-language pathology.  On arrival blood pressure is elevated 150/92.  The patient has not been taking his medications recently.  He is also been skipping his metformin.  Blood sugar today is elevated.  He is only taking 10 units of Lantus insulin daily.  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  He is smoking 5 cigarettes daily at this time.  He is only on Zestoretic 20/25 daily.   8/14 Patient seen today in return follow-up on arrival blood pressure is good 136/80.  His symptoms are from stroke are improving.  He is still using a walker.  He still has some neck spasm and the cyclobenzaprine ran out.  On arrival blood sugar 289 A1c is 8.5.  He is still smoking about 1/2 pack a day of cigarettes.  He denies any cough or mucus.  He is now off Plavix after 3 months following his stroke.  He does continue with aspirin 325 mg  daily.  Patient did have visit with disability determination physician who said he stands a good chance of getting disability.  There are no other complaints other than he has trouble staying asleep.  He goes to bed early at 830 and awakens at 2:30 in the morning.  He has a hard time going back to sleep.  He does urinate frequently at night.  9/20 this is  a transition of care visit.  Visit occurring 7 days from admission. This is a return hospital follow-up office visit.  Patient had admission in September 5 through 13 for COVID colitis.  Below is a copy of the discharge summary.  A nurse case management TOC visit is already occurred Admit date: 07/19/2022 Discharge date: 07/27/2022 Admitted From: Homeless shelter Disposition: Homeless shelter Recommendations for Outpatient Follow-up:  Follow up with PCP in in 1 to 2 weeks Check BMP and CBC at follow-up none Please follow up on the following pending results: None   Home Health: Not indicated Equipment/Devices: Not indictated   Discharge Condition: Stable CODE STATUS: Full code   Follow-up Information       [name], MD. Schedule an appointment as soon as possible for a visit in 1 week(s).   Specialty: Pulmonary Disease Contact information: [address] [place] [address] [phone]                          Hospital course 56 year old M with PMH of IDDM-2, CVA, HLD and tobacco use disorder presenting with nausea, vomiting and diarrhea and found to have AKI and gastroenteritis in the setting of COVID-19 infection.  He was managed conservatively with IV fluid and symptoms resolved.  He was treated with molnupiravir for COVID-19 infection.  He was medically stable for discharge as of 07/21/2022 but was not able to go back to shelter due to COVID-19.  His repeat test on 9/12 was negative.  He was discharged back to homeless shelter on 07/27/2022.  Patient states he has all his medication and shelter and did  not need refills on his prescriptions.   At the time of discharge, AKI resolved.  GI symptoms resolved.   Patient does advised to quit smoking cigarettes but not ready.   See individual problem list below for more.    Problems addressed during this hospitalization Principal Problem:   AKI (acute kidney injury) ([place]) Active Problems:   COVID-19 virus infection   DM (diabetes mellitus), type 2 ([place])   HTN (hypertension)   Homelessness   Tobacco use   History of ischemic right MCA stroke with residual deficit   Hyperlipidemia associated with type 2 diabetes mellitus ([place])   Gastroenteritis   Since discharge patient's been doing well blood sugars in the 130s to 140s range.  Patient declines to receive the flu vaccine at this visit.  Patient's breathing is better his abdominal status is improved constipation has resolved.  He notes his appetite is decreased on Trulicity but it appears to be working.  He continues to take his Lantus daily and also takes the [place] daily.  He has refills today.  Blood pressure is improved on valsartan HCT and amlodipine.  Blood pressure today on arrival 101/69. The patient continues to work on disability and Medicaid applications  Patient is improved with vision in the left eye and is walking better as not needing a rollator and has recovered well from stroke  12/14 Patient is seen in return follow-up.  He now has housing living with his daughter in [place].  He has been compliant with his medications.  He does have poor dentition would like a dental referral.  He now has Medicaid and has full disability.  He is down to quarter pack of cigarettes a day.  He would like a refill on Flexeril.  On arrival A1c is down to 6.5 and blood pressure is  good at 128/78. Past Medical History:  Diagnosis Date   AKI (acute kidney injury) ([place]) 07/20/2022   Closed fracture dislocation of lumbar spine ([place]) 1987   COPD (chronic obstructive pulmonary disease) ([place])     COVID-19 virus infection 07/20/2022   CVA (cerebral vascular accident) ([place]) 03/25/2022   Diabetes mellitus without complication (HCC)    GERD (gastroesophageal reflux disease)    High cholesterol    Homelessness 02/07/2019   Hypertension     History reviewed. No pertinent surgical history.  History reviewed. No pertinent family history.  Social History   Socioeconomic History   Marital status: Divorced    Spouse name: Not on file   Number of children: Not on file   Years of education: Not on file   Highest education level: Not on file  Occupational History   Not on file  Tobacco Use   Smoking status: Every Day    Packs/day: 0.50    Types: Cigarettes   Smokeless tobacco: Never  Vaping Use   Vaping Use: Never used  Substance and Sexual Activity   Alcohol use: Yes   Drug use: Not Currently   Sexual activity: Not Currently  Other Topics Concern   Not on file  Social History Narrative   Not on file   Social Determinants of Health   Financial Resource Strain: Not on file  Food Insecurity: Food Insecurity Present (07/25/2022)   Hunger Vital Sign    Worried About Running Out of Food in the Last Year: Sometimes true    Ran Out of Food in the Last Year: Sometimes true  Transportation Needs: Unmet Transportation Needs (07/25/2022)   PRAPARE - Hydrologist (Medical): Yes    Lack of Transportation (Non-Medical): Yes  Physical Activity: Not on file  Stress: Not on file  Social Connections: Not on file  Intimate Partner Violence: Not on file    ROS Review of Systems  Constitutional:  Negative for fatigue and fever.  HENT:  Negative for congestion, drooling, ear pain, facial swelling, postnasal drip, rhinorrhea, sinus pressure, sneezing, sore throat, tinnitus, trouble swallowing and voice change.        Multiple dental caries and severely damaged teeth  Eyes:  Negative for visual disturbance.  Respiratory:  Negative for apnea, cough, choking,  chest tightness, shortness of breath, wheezing and stridor.   Cardiovascular: Negative.  Negative for chest pain, palpitations and leg swelling.  Gastrointestinal:  Negative for abdominal distention, abdominal pain, anal bleeding, blood in stool, constipation, diarrhea, nausea, rectal pain and vomiting.  Endocrine: Negative for polydipsia, polyphagia and polyuria.  Genitourinary: Negative.   Musculoskeletal:  Negative for arthralgias, gait problem and myalgias.       Hip pain  Skin: Negative.  Negative for rash.  Allergic/Immunologic: Negative.  Negative for environmental allergies and food allergies.  Neurological:  Negative for dizziness, syncope, speech difficulty, weakness, light-headedness and headaches.  Hematological: Negative.  Negative for adenopathy. Does not bruise/bleed easily.  Psychiatric/Behavioral: Negative.  Negative for agitation, decreased concentration, dysphoric mood, hallucinations, self-injury, sleep disturbance and suicidal ideas. The patient is not nervous/anxious and is not hyperactive.     Objective:   Today's Vitals: BP 128/78   Pulse 93   Ht _0  (1.651 m)   Wt 181 lb (82.1 kg)   SpO2 100%   BMI 30.12 kg/m   Physical Exam Vitals reviewed.  Constitutional:      Appearance: Normal appearance. He is well-developed. He is obese. He is not  diaphoretic.  HENT:     Head: Normocephalic and atraumatic.     Nose: Nose normal. No nasal deformity, septal deviation, mucosal edema or rhinorrhea.     Right Sinus: No maxillary sinus tenderness or frontal sinus tenderness.     Left Sinus: No maxillary sinus tenderness or frontal sinus tenderness.     Mouth/Throat:     Mouth: Mucous membranes are moist.     Pharynx: Oropharynx is clear. No oropharyngeal exudate.     Comments: Poor dentition Eyes:     General: No scleral icterus.    Conjunctiva/sclera: Conjunctivae normal.     Pupils: Pupils are equal, round, and reactive to light.  Neck:     Thyroid: No  thyromegaly.     Vascular: No carotid bruit or JVD.     Trachea: Trachea normal. No tracheal tenderness or tracheal deviation.  Cardiovascular:     Rate and Rhythm: Normal rate and regular rhythm.     Chest Wall: PMI is not displaced.     Pulses: Normal pulses. No decreased pulses.     Heart sounds: Normal heart sounds, S1 normal and S2 normal. Heart sounds not distant. No murmur heard.    No systolic murmur is present.     No diastolic murmur is present.     No friction rub. No gallop. No S3 or S4 sounds.  Pulmonary:     Effort: Pulmonary effort is normal. No tachypnea, accessory muscle usage or respiratory distress.     Breath sounds: Normal breath sounds. No stridor. No decreased breath sounds, wheezing, rhonchi or rales.  Chest:     Chest wall: No tenderness.  Abdominal:     General: Bowel sounds are normal. There is no distension.     Palpations: Abdomen is soft. Abdomen is not rigid. There is no mass.     Tenderness: There is no abdominal tenderness. There is no right CVA tenderness, left CVA tenderness, guarding or rebound.     Hernia: No hernia is present.  Musculoskeletal:        General: Normal range of motion.     Cervical back: Normal range of motion and neck supple. No edema, erythema or rigidity. No muscular tenderness. Normal range of motion.  Lymphadenopathy:     Head:     Right side of head: No submental or submandibular adenopathy.     Left side of head: No submental or submandibular adenopathy.     Cervical: No cervical adenopathy.  Skin:    General: Skin is warm and dry.     Coloration: Skin is not pale.     Findings: No rash.     Nails: There is no clubbing.  Neurological:     Mental Status: He is alert and oriented to person, place, and time.     Cranial Nerves: Cranial nerves 2-12 are intact.     Sensory: Sensation is intact. No sensory deficit.     Motor: No weakness, tremor, atrophy, abnormal muscle tone, seizure activity or pronator drift.      Coordination: Coordination is intact.     Gait: Gait abnormal and tandem walk abnormal.  Psychiatric:        Speech: Speech normal.        Behavior: Behavior normal.     Assessment & Plan:   Problem List Items Addressed This Visit       Cardiovascular and Mediastinum   HTN (hypertension)    Hypertension currently at goal continue carvedilol 25 mg twice daily  amlodipine 10 mg daily valsartan HCT daily      Relevant Medications   amLODipine (NORVASC) 10 MG tablet   atorvastatin (LIPITOR) 40 MG tablet   carvedilol (COREG) 25 MG tablet   valsartan-hydrochlorothiazide (DIOVAN-HCT) 320-25 MG tablet   Other Relevant Orders   Comprehensive metabolic panel   CBC with Differential/Platelet     Respiratory   COPD with chronic bronchitis    Urged continue reduction in tobacco intake continue inhalers      Relevant Medications   albuterol (VENTOLIN HFA) 108 (90 Base) MCG/ACT inhaler   fluticasone-salmeterol (ADVAIR DISKUS) 100-50 MCG/ACT AEPB     Endocrine   DM (diabetes mellitus), type 2 (HCC) - Primary    No change in current medications refill given continue with metformin 1000 mg daily Basaglar insulin 20 units daily Farxiga 10 mg daily      Relevant Medications   atorvastatin (LIPITOR) 40 MG tablet   dapagliflozin propanediol (FARXIGA) 10 MG TABS tablet   Dulaglutide (TRULICITY) 8.67 YP/9.5KD SOPN (Start on 10/28/2022)   Insulin Glargine (BASAGLAR KWIKPEN) 100 UNIT/ML   valsartan-hydrochlorothiazide (DIOVAN-HCT) 320-25 MG tablet   Other Relevant Orders   POCT glycosylated hemoglobin (Hb A1C) (Completed)   Comprehensive metabolic panel   Ambulatory referral to Dentistry   Hyperlipidemia associated with type 2 diabetes mellitus ([place])    Continue with statin therapy      Relevant Medications   amLODipine (NORVASC) 10 MG tablet   atorvastatin (LIPITOR) 40 MG tablet   carvedilol (COREG) 25 MG tablet   dapagliflozin propanediol (FARXIGA) 10 MG TABS tablet   Dulaglutide  (TRULICITY) 3.26 ZT/2.4PY SOPN (Start on 10/28/2022)   Insulin Glargine (BASAGLAR KWIKPEN) 100 UNIT/ML   valsartan-hydrochlorothiazide (DIOVAN-HCT) 320-25 MG tablet   Other Relevant Orders   Lipid panel     Genitourinary   RESOLVED: AKI (acute kidney injury) ([place])    Resolved        Other   Tobacco use       Current smoking consumption amount:  10 cigarettes a day  Dicsussion on advise to quit smoking and smoking impacts: Lung cardiovascular impacts  Patient's willingness to quit: Willing to quit  Methods to quit smoking discussed: Behavioral modification  Medication management of smoking session drugs discussed: Nicotine replacement  Resources provided:  AVS   Setting quit date not established  Follow-up arranged 4 months   Time spent counseling the patient: 5 minutes       RESOLVED: Homelessness    Currently has housing      Other Visit Diagnoses     Dental caries       Relevant Orders   Ambulatory referral to Dentistry      Outpatient Encounter Medications as of 10/27/2022  Medication Sig   aspirin 325 MG tablet Take 1 tablet (325 mg total) by mouth daily.   cyanocobalamin (VITAMIN B12) 1000 MCG tablet Take 1 tablet (1,000 mcg total) by mouth once daily.   Insulin Pen Needle (PEN NEEDLES) 31G X 5 MM MISC use as directed in the morning, at noon, in the evening, and at bedtime.   metFORMIN (GLUCOPHAGE) 500 MG tablet Take 2 tablets (1,000 mg total) by mouth daily with breakfast.   pantoprazole (PROTONIX) 40 MG tablet Take 1 tablet (40 mg total) by mouth once daily. (Patient taking differently: Take 40 mg by mouth daily before breakfast.)   polyethylene glycol powder (GLYCOLAX/MIRALAX) 17 GM/SCOOP powder Take 17 g by mouth 2 (two) times daily as needed.   thiamine (VITAMIN B1)  100 MG tablet Take 1 tablet (100 mg total) by mouth once daily.   [DISCONTINUED] albuterol (VENTOLIN HFA) 108 (90 Base) MCG/ACT inhaler Inhale 2 puffs into the lungs every 6 (six)  hours as needed for wheezing or shortness of breath.   [DISCONTINUED] amLODipine (NORVASC) 10 MG tablet Take 1 tablet (10 mg total) by mouth daily. (Patient taking differently: Take 10 mg by mouth every evening.)   [DISCONTINUED] atorvastatin (LIPITOR) 40 MG tablet Take 1 tablet (40 mg total) by mouth daily. (Patient taking differently: Take 40 mg by mouth every evening.)   [DISCONTINUED] carvedilol (COREG) 25 MG tablet Take 1 tablet (25 mg total) by mouth 2 (two) times daily with a meal.   [DISCONTINUED] cyclobenzaprine (FLEXERIL) 10 MG tablet Take 1 tablet (10 mg total) by mouth 3 (three) times daily as needed for muscle spasms.   [DISCONTINUED] dapagliflozin propanediol (FARXIGA) 10 MG TABS tablet Take 1 tablet (10 mg total) by mouth daily before breakfast.   [DISCONTINUED] Dulaglutide (TRULICITY) 4.15 AX/0.9MM SOPN Inject 0.75 mg into the skin once a week. (Patient taking differently: Inject 0.75 mg into the skin every Friday.)   [DISCONTINUED] fluticasone-salmeterol (ADVAIR DISKUS) 100-50 MCG/ACT AEPB Inhale 1 puff into the lungs 2 (two) times daily.   [DISCONTINUED] Insulin Glargine (BASAGLAR KWIKPEN) 100 UNIT/ML Inject 20 Units into the skin daily. (Patient taking differently: Inject 20 Units into the skin daily after breakfast.)   [DISCONTINUED] valsartan-hydrochlorothiazide (DIOVAN-HCT) 320-25 MG tablet Take 1 tablet by mouth once daily.   albuterol (VENTOLIN HFA) 108 (90 Base) MCG/ACT inhaler Inhale 2 puffs into the lungs every 6 (six) hours as needed for wheezing or shortness of breath.   amLODipine (NORVASC) 10 MG tablet Take 1 tablet (10 mg total) by mouth daily.   atorvastatin (LIPITOR) 40 MG tablet Take 1 tablet (40 mg total) by mouth every evening.   carvedilol (COREG) 25 MG tablet Take 1 tablet (25 mg total) by mouth 2 (two) times daily with a meal.   cyclobenzaprine (FLEXERIL) 10 MG tablet Take 1 tablet (10 mg total) by mouth 3 (three) times daily as needed for muscle spasms.    dapagliflozin propanediol (FARXIGA) 10 MG TABS tablet Take 1 tablet (10 mg total) by mouth daily before breakfast.   [START ON 10/28/2022] Dulaglutide (TRULICITY) 7.68 GS/8.1JS SOPN Inject 0.75 mg into the skin every Friday.   fluticasone-salmeterol (ADVAIR DISKUS) 100-50 MCG/ACT AEPB Inhale 1 puff into the lungs 2 (two) times daily.   Insulin Glargine (BASAGLAR KWIKPEN) 100 UNIT/ML Inject 20 Units into the skin daily after breakfast.   valsartan-hydrochlorothiazide (DIOVAN-HCT) 320-25 MG tablet Take 1 tablet by mouth once daily.   [DISCONTINUED] pantoprazole (PROTONIX) 40 MG tablet Take 1 tablet (40 mg total) by mouth daily.   [DISCONTINUED] sucralfate (CARAFATE) 1 g tablet Take 1 tablet (1 g total) by mouth with breakfast, with lunch, and with evening meal for 7 days.   No facility-administered encounter medications on file as of 10/27/2022.  Follow-up: Return in about 4 months (around 02/26/2023) for diabetes.   Asencion Noble, MD

## 2022-10-27 NOTE — Assessment & Plan Note (Signed)
Urged continue reduction in tobacco intake continue inhalers

## 2022-10-27 NOTE — Patient Instructions (Signed)
No change in medications refill sent to your [facility] in [facility]  Complete set of lab draws obtained at this visit  Focus on reducing your tobacco intake  Return to see Dr. Delford Field 4 months will get an afternoon appointment for you

## 2022-10-27 NOTE — Assessment & Plan Note (Signed)
Resolved

## 2022-10-27 NOTE — Assessment & Plan Note (Signed)
Currently has housing ?

## 2022-10-27 NOTE — Assessment & Plan Note (Signed)
Hypertension currently at goal continue carvedilol 25 mg twice daily amlodipine 10 mg daily valsartan HCT daily

## 2022-10-28 ENCOUNTER — Telehealth: Payer: Self-pay

## 2022-10-28 ENCOUNTER — Other Ambulatory Visit: Payer: Self-pay | Admitting: Critical Care Medicine

## 2022-10-28 LAB — CBC WITH DIFFERENTIAL/PLATELET
Basophils Absolute: 0 10*3/uL (ref 0.0–0.2)
Basos: 0 %
EOS (ABSOLUTE): 0.1 10*3/uL (ref 0.0–0.4)
Eos: 1 %
Hematocrit: 45.2 % (ref 37.5–51.0)
Hemoglobin: 15.1 g/dL (ref 13.0–17.7)
Immature Grans (Abs): 0 10*3/uL (ref 0.0–0.1)
Immature Granulocytes: 0 %
Lymphocytes Absolute: 2.6 10*3/uL (ref 0.7–3.1)
Lymphs: 27 %
MCH: 29.7 pg (ref 26.6–33.0)
MCHC: 33.4 g/dL (ref 31.5–35.7)
MCV: 89 fL (ref 79–97)
Monocytes Absolute: 0.7 10*3/uL (ref 0.1–0.9)
Monocytes: 8 %
Neutrophils Absolute: 6.3 10*3/uL (ref 1.4–7.0)
Neutrophils: 64 %
Platelets: 277 10*3/uL (ref 150–450)
RBC: 5.08 x10E6/uL (ref 4.14–5.80)
RDW: 14.6 % (ref 11.6–15.4)
WBC: 9.9 10*3/uL (ref 3.4–10.8)

## 2022-10-28 LAB — COMPREHENSIVE METABOLIC PANEL
ALT: 37 IU/L (ref 0–44)
AST: 28 IU/L (ref 0–40)
Albumin/Globulin Ratio: 1.8 (ref 1.2–2.2)
Albumin: 4.6 g/dL (ref 3.8–4.9)
Alkaline Phosphatase: 98 IU/L (ref 44–121)
BUN/Creatinine Ratio: 19 (ref 9–20)
BUN: 21 mg/dL (ref 6–24)
Bilirubin Total: 0.5 mg/dL (ref 0.0–1.2)
CO2: 26 mmol/L (ref 20–29)
Calcium: 9.8 mg/dL (ref 8.7–10.2)
Chloride: 99 mmol/L (ref 96–106)
Creatinine, Ser: 1.08 mg/dL (ref 0.76–1.27)
Globulin, Total: 2.5 g/dL (ref 1.5–4.5)
Glucose: 165 mg/dL — ABNORMAL HIGH (ref 70–99)
Potassium: 4.1 mmol/L (ref 3.5–5.2)
Sodium: 140 mmol/L (ref 134–144)
Total Protein: 7.1 g/dL (ref 6.0–8.5)
eGFR: 80 mL/min/{1.73_m2} (ref >59)

## 2022-10-28 LAB — LIPID PANEL
Chol/HDL Ratio: 4.3 ratio (ref 0.0–5.0)
Cholesterol, Total: 233 mg/dL — ABNORMAL HIGH (ref 100–199)
HDL: 54 mg/dL (ref >39)
LDL Chol Calc (NIH): 150 mg/dL — ABNORMAL HIGH (ref 0–99)
Triglycerides: 162 mg/dL — ABNORMAL HIGH (ref 0–149)
VLDL Cholesterol Cal: 29 mg/dL (ref 5–40)

## 2022-10-28 MED ORDER — ATORVASTATIN CALCIUM 80 MG PO TABS: 80.0000 mg | ORAL_TABLET | Freq: Every evening | ORAL | 2 refills | Status: DC

## 2022-10-28 NOTE — Telephone Encounter (Signed)
Pt was called and vm was left, Information has been sent to nurse pool.   

## 2022-10-28 NOTE — Progress Notes (Signed)
Let pt know all labs normal except cholesterol stoll to high new medication rx will be sent.to his [place] pharmacy

## 2022-10-28 NOTE — Telephone Encounter (Signed)
-----   Message from [name], MD sent at 10/28/2022  9:36 AM EST ----- Let pt know all labs normal except cholesterol stoll to high new medication rx will be sent.to his [place] pharmacy

## 2022-10-31 ENCOUNTER — Other Ambulatory Visit: Payer: Self-pay

## 2022-10-31 ENCOUNTER — Other Ambulatory Visit: Payer: Self-pay | Admitting: Pharmacist

## 2022-10-31 MED ORDER — LANTUS SOLOSTAR 100 UNIT/ML ~~LOC~~ SOPN
20.0000 [IU] | PEN_INJECTOR | Freq: Every day | SUBCUTANEOUS | 2 refills | Status: DC
  Filled 2022-10-31: qty 15, 75d supply, fill #0
  Filled 2022-12-31: qty 6, 30d supply, fill #0
  Filled 2023-02-13: qty 6, 30d supply, fill #1

## 2022-11-01 ENCOUNTER — Other Ambulatory Visit: Payer: Self-pay

## 2022-11-08 ENCOUNTER — Other Ambulatory Visit: Payer: Self-pay

## 2022-11-25 ENCOUNTER — Other Ambulatory Visit: Payer: Self-pay

## 2023-01-02 ENCOUNTER — Other Ambulatory Visit: Payer: Self-pay

## 2023-01-06 ENCOUNTER — Other Ambulatory Visit: Payer: Self-pay

## 2023-01-16 ENCOUNTER — Telehealth: Payer: Self-pay | Admitting: Emergency Medicine

## 2023-01-16 ENCOUNTER — Other Ambulatory Visit: Payer: Self-pay | Admitting: Pharmacist

## 2023-01-16 ENCOUNTER — Other Ambulatory Visit: Payer: Self-pay

## 2023-01-16 MED ORDER — TRULICITY 0.75 MG/0.5ML ~~LOC~~ SOAJ
0.7500 mg | SUBCUTANEOUS | 4 refills | Status: DC
  Filled 2023-01-16: qty 2, 28d supply, fill #0
  Filled 2023-02-13 – 2023-02-17 (×3): qty 2, 28d supply, fill #1

## 2023-01-16 MED ORDER — TRULICITY 0.75 MG/0.5ML ~~LOC~~ SOAJ
0.7500 mg | SUBCUTANEOUS | 4 refills | Status: DC
  Filled 2023-01-16: qty 2, 28d supply, fill #0

## 2023-01-16 NOTE — Telephone Encounter (Signed)
Lurena Joiner can you fu and handle. I am out til tomorrow

## 2023-01-16 NOTE — Telephone Encounter (Signed)
Copied from [place] [phone]. Topic: General - Other >> Jan 16, 2023 10:37 AM Everette C wrote: Reason for CRM:The patient has called to share that their pharmacy cannot supply their insulin cannot be dispensed by their pharmacy due to refrigeration issues   The patient shares that they have not had their insulin for roughly three days  The patient had their last Dulaglutide (TRULICITY) [id number] [id number] injection on 01/13/23  The patient would like to speak with a member of staff regarding their concerns further when possible

## 2023-01-18 ENCOUNTER — Other Ambulatory Visit: Payer: Self-pay

## 2023-01-20 ENCOUNTER — Other Ambulatory Visit: Payer: Self-pay

## 2023-02-13 ENCOUNTER — Other Ambulatory Visit: Payer: Self-pay

## 2023-02-13 ENCOUNTER — Other Ambulatory Visit: Payer: Self-pay | Admitting: Critical Care Medicine

## 2023-02-13 ENCOUNTER — Other Ambulatory Visit: Payer: Self-pay | Admitting: Physician Assistant

## 2023-02-13 MED ORDER — PANTOPRAZOLE SODIUM 40 MG PO TBEC
40.0000 mg | DELAYED_RELEASE_TABLET | Freq: Every day | ORAL | 0 refills | Status: DC
  Filled 2023-02-13 – 2023-02-17 (×2): qty 30, 30d supply, fill #0

## 2023-02-13 NOTE — Telephone Encounter (Signed)
Medication Refill - Medication: cyclobenzaprine (FLEXERIL) 10 MG table   Has the patient contacted their pharmacy? No. (Agent: If no, request that the patient contact the pharmacy for the refill. If patient does not wish to contact the pharmacy document the reason why and proceed with request.) (Agent: If yes, when and what did the pharmacy advise?)  Preferred Pharmacy (with phone number or street name):  [place] Phone: [phone]  Fax: [phone]     Has the patient been seen for an appointment in the last year OR does the patient have an upcoming appointment? Yes.    Agent: Please be advised that RX refills may take up to 3 business days. We ask that you follow-up with your pharmacy.

## 2023-02-14 ENCOUNTER — Other Ambulatory Visit: Payer: Self-pay

## 2023-02-14 NOTE — Telephone Encounter (Signed)
Requested medications are due for refill today.  unsure  Requested medications are on the active medications list.  yes  Last refill. 10/27/2022 #90 1 rf  Future visit scheduled.   yes  Notes to clinic.  Refill not delegated.    Requested Prescriptions  Pending Prescriptions Disp Refills   cyclobenzaprine (FLEXERIL) 10 MG tablet 90 tablet 1    Sig: Take 1 tablet (10 mg total) by mouth 3 (three) times daily as needed for muscle spasms.     Not Delegated - Analgesics:  Muscle Relaxants Failed - 02/13/2023 12:02 PM      Failed - This refill cannot be delegated      Passed - Valid encounter within last 6 months    Recent Outpatient Visits           3 months ago Type 2 diabetes mellitus with hyperglycemia, with long-term current use of insulin [facility])   [place] [name], MD   6 months ago COVID-19 virus infection   [place] [name], MD   7 months ago Type 2 diabetes mellitus with hyperglycemia, with long-term current use of insulin [facility])   [place] [name], MD   10 months ago Acute ischemic right MCA stroke [facility])   [place] [name], MD   12 months ago Type 2 diabetes mellitus with hyperglycemia, without long-term current use of insulin [facility])   [facility], [place] Grip, [place]       Future Appointments             In 2 weeks Joya Gaskins [name], MD [place]

## 2023-02-15 ENCOUNTER — Other Ambulatory Visit: Payer: Self-pay | Admitting: Pharmacist

## 2023-02-15 ENCOUNTER — Ambulatory Visit: Payer: Self-pay

## 2023-02-15 ENCOUNTER — Other Ambulatory Visit: Payer: Self-pay

## 2023-02-15 MED ORDER — CYCLOBENZAPRINE HCL 10 MG PO TABS
10.0000 mg | ORAL_TABLET | Freq: Three times a day (TID) | ORAL | 0 refills | Status: DC | PRN
  Filled 2023-02-15 – 2023-02-17 (×2): qty 90, 30d supply, fill #0

## 2023-02-15 MED ORDER — TRUE METRIX BLOOD GLUCOSE TEST VI STRP
ORAL_STRIP | 0 refills | Status: DC
  Filled 2023-02-15: qty 100, 33d supply, fill #0

## 2023-02-15 NOTE — Telephone Encounter (Signed)
  Chief Complaint: medication refill Symptoms: NA Frequency: today Pertinent Negatives: NA Disposition: [] ED /[] Urgent Care (no appt availability in office) / [] Appointment(In office/virtual)/ []  [place] [facility]/ [] Home Care/ [] Refused Recommended Disposition /[] [place] [facility]/ [x]  Follow-up with PCP Additional Notes: pt states he is needing a refill on his True Metrix Test Strips only. His glucometer is only about 58 year old and has enough lancets. Pt checks CBGs QD. Pt states he will be coming to [facility] tomorrow so if can be sent to pharmacy so he can pick up tomorrow would be great. Advised I would send to provider since not on med list.   Summary: Pt requesting a refill of Rx that is not listed   Pt requesting that a Rx refill for True Metrix test strips be sent to his pharmacy but it is not listed. Cb# [phone]     Reason for Disposition  [1] Caller has NON-URGENT medicine question about med that PCP prescribed AND [2] triager unable to answer question  Answer Assessment - Initial Assessment Questions 1. NAME of MEDICINE: "What medicine(s) are you calling about?"     True metrix test strips  2. QUESTION: "What is your question?" (e.g., double dose of medicine, side effect)     Needing a refill, not on med list 3. PRESCRIBER: "Who prescribed the medicine?" Reason: if prescribed by specialist, call should be referred to that group.  Protocols used: Medication Question Call-A-AH

## 2023-02-16 ENCOUNTER — Encounter: Payer: Self-pay | Admitting: Pharmacist

## 2023-02-16 ENCOUNTER — Other Ambulatory Visit: Payer: Self-pay

## 2023-02-17 ENCOUNTER — Other Ambulatory Visit: Payer: Self-pay

## 2023-02-24 ENCOUNTER — Other Ambulatory Visit: Payer: Self-pay

## 2023-02-27 NOTE — Progress Notes (Unsigned)
New Patient Office Visit  Subjective:  Patient ID: Stanley Stone, male    DOB: 1966/04/20  Age: 57 y.o. MRN: [id number]  CC:  [facility] visit medication refills  HPI 01/18/22 Stanley Stone presents for to establish care.  I have not seen this patient since March 2020.  This patient is homeless he lives in an abandoned house in [place].  There is no electricity or heat.  He is sleeping on the floor.  Patient has severe hypertension and type 2 diabetes.  On arrival blood pressure 180/105.  He was given prescription last week for Zestoretic along with metformin and atorvastatin.  Patient's been having significant dizziness emesis epigastric abdominal pain some blood intermixed in the emesis.  He drinks about 1 beer a day.  If he stops drinking beer he feels he might go into delirium tremens.  He has a history of COPD and does have a chronic cough with some wheezing.  On arrival A1c is 9.1 blood sugar is 232.  He does not have a meter currently to measure his blood sugar.  He eats a lot of carbohydrates because he gets food from charts of food pantry's that have largely carbohydrate-based meals.  Patient does note shortness of breath with exertion.  He does not have any rectal bleeding.  He notes some numbness in the hands and in the feet.  04/05/22 This patient was seen last week at the [facility] today's visit is a posthospital visit for medication refills. Below is documentation from the last visit Seen 5/17 at [facility]: This is a transition of care visit which occurred in the [facility].  This is a 57 year old male primary care patient of mine who is seen post hospital for recent left MCA stroke.  He was just discharged this week.  Below is a copy of the discharge summary   Date of Admission: 03/25/2022 Date of Discharge: 03/28/2022   Attending Physician:Jeffrey Hennie Duos, MD   Patient's LTR:VUYEBX, Stanley Harry, MD   Consults: Neurology   Disposition:  Discharge home   Follow-up Appts:   Follow-up Information       [name], MD Follow up.   Specialty: Pulmonary Disease Contact information: [address] [place] [address] [phone]              [facility]. Schedule an appointment as soon as possible for a visit in 1 month(s).   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  Since the initial onset it has waxed and waned in an unpredictable fashion.   Hospital Course:   Left brain hypoperfusion versus TIA due to left MCA occlusion/large vessel disease - R arm weakness -CT head without acute findings -MRI brain negative for acute/subacute CVA -mild chronic small vessel ischemic disease noted with cerebral and cerebellar atrophy -MRa head notes severe intracranial atherosclerosis with a left MCA occlusion -MRa neck negative -MRI C-spine notes cervical spine degeneration  most notable at C4-5 with right foraminal impingement and right ventral cord indentation -TTE notes EF 60-65% with normal LV function and no WMA with grade 1 DD and no intracardiac source of embolism appreciable -LDL 76 -continue Lipitor without change -A1c 8.2 Neurology recommending ASA 325 mg plus Plavix for 3 months then ASA alone, and avoidance of hypotension   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 with right foraminal impingement and right ventral cord indentation [name] - Neurology recommending outpatient EMGs/NCS -symptoms have spontaneously resolved during his hospital stay   Vitamin B12 deficiency Vitamin B12 quite low at 228 - supplemented with subcutaneous load while inpatient, and oral replacement after discharge - B12 should be rechecked in 8-12 weeks to assure he is able to absorb it orally    Hypokalemia Likely due to poor nutritional status -corrected with supplementation   DM2 A1c 8.2 -CBG well controlled as inpatient   COPD with chronic bronchitis  No acute exacerbation   Tobacco use Discussed need for absolute tobacco cessation with patient -utilized nicotine patch to assist w/ cessation    Homelessness Chronically homeless. Pt lives in a tent. TOC/CSW assisted with maximizing outpatient f/u and resources. All new meds provided to patient at bedside from [facility] prior to d/c home.    History of alcohol abuse Pt has not had any alcohol in 5 days -no evidence of withdrawal during this admission - encouraged continued abstinence    HTN Avoid overcorrection/hypotension in setting of significant intracranial vascular disease       This patient had been living in a tent in [facility] he now is in the [place] thanks to [employer] [employer].  The patient was discharged with out his insulin and also without any aspirin.  He did receive 3 transition of care pharmacy all of his  other medications.   Med list is noted below       Outpatient Encounter Medications as of 03/30/2022  Medication Sig   albuterol (VENTOLIN HFA) 108 (90 Base) MCG/ACT inhaler Inhale 2 puffs into the lungs every 6 (six) hours as needed for wheezing or shortness of breath.   amLODipine (NORVASC) 10 MG tablet Take 1 tablet (10 mg total) by mouth daily.   aspirin 325 MG tablet Take 1 tablet (325 mg total) by mouth daily.   atorvastatin (LIPITOR) 40 MG tablet Take 1 tablet (40 mg total) by mouth daily.   Blood Glucose Monitoring Suppl (TRUE METRIX METER) w/Device KIT Use as directed daily to check blood sugar   carvedilol (COREG) 25 MG tablet Take 1 tablet (25 mg total) by mouth 2 (two) times daily with a meal.   clopidogrel (PLAVIX) 75 MG tablet Take 1 tablet (75 mg total) by mouth daily.   fluticasone-salmeterol (ADVAIR HFA) 115-21 MCG/ACT inhaler Inhale 2 puffs into the lungs 2 (two) times daily.   glucose blood (TRUE METRIX BLOOD GLUCOSE TEST) test strip  Use as instructed   Insulin Glargine (BASAGLAR KWIKPEN) 100 UNIT/ML Inject 10 Units into the skin daily.   Insulin Pen Needle (UNIFINE PENTIPS) 31G X 8 MM MISC Use with insulin pen   lisinopril-hydrochlorothiazide (ZESTORETIC) 20-25 MG tablet Take 1 tablet by mouth daily.   metFORMIN (GLUCOPHAGE) 500 MG tablet Take 1 tablet (500 mg total) by mouth 2 (two) times daily with a meal. (Patient not taking: Reported on 03/25/2022)   nicotine (NICODERM CQ - DOSED IN MG/24 HOURS) 21 mg/24hr patch Place 1 patch (21 mg total) onto the skin daily.   pantoprazole (PROTONIX) 40 MG tablet Take 1 tablet (40 mg total) by mouth daily.   thiamine 100 MG tablet Take 1 tablet (100 mg total) by mouth daily.   traMADol (ULTRAM) 50 MG tablet Take 1 tablet (50 mg total) by mouth every 6 (six) hours as needed for moderate pain.   TRUEplus Lancets 28G MISC Check blood sugar daily   vitamin B-12 (CYANOCOBALAMIN) 1000 MCG tablet Take 1 tablet (1,000 mcg total) by mouth  daily.    No facility-administered encounter medications on file as of 03/30/2022.    Patient is still struggling with partial aphasia and left-sided weakness.  He was not established with any physical therapy or speech therapy post hospital as he is uninsured.   On exam blood pressure is elevated 165/81 saturation 97% pulse 86 patient demonstrates aphasia he has right-sided upper extremity weakness Chest is clear Cardiac exam unremarkable   Impression is that of recent MCA stroke you will need access to his insulin products he will need post hospital follow-up in my clinic as soon as possible we will establish this  We did give him samples of 325 mg aspirin all his other medications are accessible and have been reconciled  We will endeavor to see if we can get this patient physical therapy and speech therapy through some type of grant program as he is uninsured    Today in the clinic the patient has difficulty with memory still has trouble with ambulation using a rollator has a follow-up appointment June 6 at rehab for physical therapy and speech-language pathology.  On arrival blood pressure is elevated 150/92.  The patient has not been taking his medications recently.  He is also been skipping his metformin.  Blood sugar today is elevated.  He is only taking 10 units of Lantus insulin daily.  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  He is smoking 5 cigarettes daily at this time.  He is only on Zestoretic 20/25 daily.   8/14 Patient seen today in return follow-up on arrival blood pressure is good 136/80.  His symptoms are from stroke are improving.  He is still using a walker.  He still has some neck spasm and the cyclobenzaprine ran out.  On arrival blood sugar 289 A1c is 8.5.  He is still smoking about 1/2 pack a day of cigarettes.  He denies any cough or mucus.  He is now off Plavix after 3 months following his stroke.  He does continue with aspirin 325 mg  daily.  Patient did have visit with disability determination physician who said he stands a good chance of getting disability.  There are no other complaints other than he has trouble staying asleep.  He goes to bed early at 830 and awakens at 2:30 in the morning.  He has a hard time going back to sleep.  He does urinate frequently at night.  9/20 this is  a transition of care visit.  Visit occurring 7 days from admission. This is a return hospital follow-up office visit.  Patient had admission in September 5 through 13 for COVID colitis.  Below is a copy of the discharge summary.  A nurse case management TOC visit is already occurred Admit date: 07/19/2022 Discharge date: 07/27/2022 Admitted From: Homeless shelter Disposition: Homeless shelter Recommendations for Outpatient Follow-up:  Follow up with PCP in in 1 to 2 weeks Check BMP and CBC at follow-up none Please follow up on the following pending results: None   Home Health: Not indicated Equipment/Devices: Not indictated   Discharge Condition: Stable CODE STATUS: Full code   Follow-up Information       [name], MD. Schedule an appointment as soon as possible for a visit in 1 week(s).   Specialty: Pulmonary Disease Contact information: [address] [place] [address] [phone]                          Hospital course 57 year old M with PMH of IDDM-2, CVA, HLD and tobacco use disorder presenting with nausea, vomiting and diarrhea and found to have AKI and gastroenteritis in the setting of COVID-19 infection.  He was managed conservatively with IV fluid and symptoms resolved.  He was treated with molnupiravir for COVID-19 infection.  He was medically stable for discharge as of 07/21/2022 but was not able to go back to shelter due to COVID-19.  His repeat test on 9/12 was negative.  He was discharged back to homeless shelter on 07/27/2022.  Patient states he has all his medication and shelter and did  not need refills on his prescriptions.   At the time of discharge, AKI resolved.  GI symptoms resolved.   Patient does advised to quit smoking cigarettes but not ready.   See individual problem list below for more.    Problems addressed during this hospitalization Principal Problem:   AKI (acute kidney injury) ([place]) Active Problems:   COVID-19 virus infection   DM (diabetes mellitus), type 2 ([facility])   HTN (hypertension)   Homelessness   Tobacco use   History of ischemic right MCA stroke with residual deficit   Hyperlipidemia associated with type 2 diabetes mellitus ([place])   Gastroenteritis   Since discharge patient's been doing well blood sugars in the 130s to 140s range.  Patient declines to receive the flu vaccine at this visit.  Patient's breathing is better his abdominal status is improved constipation has resolved.  He notes his appetite is decreased on Trulicity but it appears to be working.  He continues to take his Lantus daily and also takes the [place] daily.  He has refills today.  Blood pressure is improved on valsartan HCT and amlodipine.  Blood pressure today on arrival 101/69. The patient continues to work on disability and Medicaid applications  Patient is improved with vision in the left eye and is walking better as not needing a rollator and has recovered well from stroke  12/14 Patient is seen in return follow-up.  He now has housing living with his daughter in [place].  He has been compliant with his medications.  He does have poor dentition would like a dental referral.  He now has Medicaid and has full disability.  He is down to quarter pack of cigarettes a day.  He would like a refill on Flexeril.  On arrival A1c is down to 6.5 and blood pressure is  good at 128/78. Past Medical History:  Diagnosis Date   AKI (acute kidney injury) (HCC) 07/20/2022   Closed fracture dislocation of lumbar spine (HCC) 1987   COPD (chronic obstructive pulmonary disease) (HCC)     COVID-19 virus infection 07/20/2022   CVA (cerebral vascular accident) (HCC) 03/25/2022   Diabetes mellitus without complication (HCC)    GERD (gastroesophageal reflux disease)    High cholesterol    Homelessness 02/07/2019   Hypertension     No past surgical history on file.  No family history on file.  Social History   Socioeconomic History   Marital status: Divorced    Spouse name: Not on file   Number of children: Not on file   Years of education: Not on file   Highest education level: Not on file  Occupational History   Not on file  Tobacco Use   Smoking status: Every Day    Packs/day: .5    Types: Cigarettes   Smokeless tobacco: Never  Vaping Use   Vaping Use: Never used  Substance and Sexual Activity   Alcohol use: Yes   Drug use: Not Currently   Sexual activity: Not Currently  Other Topics Concern   Not on file  Social History Narrative   Not on file   Social Determinants of Health   Financial Resource Strain: Not on file  Food Insecurity: Food Insecurity Present (07/25/2022)   Hunger Vital Sign    Worried About Running Out of Food in the Last Year: Sometimes true    Ran Out of Food in the Last Year: Sometimes true  Transportation Needs: Unmet Transportation Needs (07/25/2022)   PRAPARE - Administrator, Civil Service (Medical): Yes    Lack of Transportation (Non-Medical): Yes  Physical Activity: Not on file  Stress: Not on file  Social Connections: Not on file  Intimate Partner Violence: Not on file    ROS Review of Systems  Constitutional:  Negative for fatigue and fever.  HENT:  Negative for congestion, drooling, ear pain, facial swelling, postnasal drip, rhinorrhea, sinus pressure, sneezing, sore throat, tinnitus, trouble swallowing and voice change.        Multiple dental caries and severely damaged teeth  Eyes:  Negative for visual disturbance.  Respiratory:  Negative for apnea, cough, choking, chest tightness, shortness of breath,  wheezing and stridor.   Cardiovascular: Negative.  Negative for chest pain, palpitations and leg swelling.  Gastrointestinal:  Negative for abdominal distention, abdominal pain, anal bleeding, blood in stool, constipation, diarrhea, nausea, rectal pain and vomiting.  Endocrine: Negative for polydipsia, polyphagia and polyuria.  Genitourinary: Negative.   Musculoskeletal:  Negative for arthralgias, gait problem and myalgias.       Hip pain  Skin: Negative.  Negative for rash.  Allergic/Immunologic: Negative.  Negative for environmental allergies and food allergies.  Neurological:  Negative for dizziness, syncope, speech difficulty, weakness, light-headedness and headaches.  Hematological: Negative.  Negative for adenopathy. Does not bruise/bleed easily.  Psychiatric/Behavioral: Negative.  Negative for agitation, decreased concentration, dysphoric mood, hallucinations, self-injury, sleep disturbance and suicidal ideas. The patient is not nervous/anxious and is not hyperactive.     Objective:   Today's Vitals: There were no vitals taken for this visit.  Physical Exam Vitals reviewed.  Constitutional:      Appearance: Normal appearance. He is well-developed. He is obese. He is not diaphoretic.  HENT:     Head: Normocephalic and atraumatic.     Nose: Nose normal. No nasal deformity, septal deviation,  mucosal edema or rhinorrhea.     Right Sinus: No maxillary sinus tenderness or frontal sinus tenderness.     Left Sinus: No maxillary sinus tenderness or frontal sinus tenderness.     Mouth/Throat:     Mouth: Mucous membranes are moist.     Pharynx: Oropharynx is clear. No oropharyngeal exudate.     Comments: Poor dentition Eyes:     General: No scleral icterus.    Conjunctiva/sclera: Conjunctivae normal.     Pupils: Pupils are equal, round, and reactive to light.  Neck:     Thyroid: No thyromegaly.     Vascular: No carotid bruit or JVD.     Trachea: Trachea normal. No tracheal  tenderness or tracheal deviation.  Cardiovascular:     Rate and Rhythm: Normal rate and regular rhythm.     Chest Wall: PMI is not displaced.     Pulses: Normal pulses. No decreased pulses.     Heart sounds: Normal heart sounds, S1 normal and S2 normal. Heart sounds not distant. No murmur heard.    No systolic murmur is present.     No diastolic murmur is present.     No friction rub. No gallop. No S3 or S4 sounds.  Pulmonary:     Effort: Pulmonary effort is normal. No tachypnea, accessory muscle usage or respiratory distress.     Breath sounds: Normal breath sounds. No stridor. No decreased breath sounds, wheezing, rhonchi or rales.  Chest:     Chest wall: No tenderness.  Abdominal:     General: Bowel sounds are normal. There is no distension.     Palpations: Abdomen is soft. Abdomen is not rigid. There is no mass.     Tenderness: There is no abdominal tenderness. There is no right CVA tenderness, left CVA tenderness, guarding or rebound.     Hernia: No hernia is present.  Musculoskeletal:        General: Normal range of motion.     Cervical back: Normal range of motion and neck supple. No edema, erythema or rigidity. No muscular tenderness. Normal range of motion.  Lymphadenopathy:     Head:     Right side of head: No submental or submandibular adenopathy.     Left side of head: No submental or submandibular adenopathy.     Cervical: No cervical adenopathy.  Skin:    General: Skin is warm and dry.     Coloration: Skin is not pale.     Findings: No rash.     Nails: There is no clubbing.  Neurological:     Mental Status: He is alert and oriented to person, place, and time.     Cranial Nerves: Cranial nerves 2-12 are intact.     Sensory: Sensation is intact. No sensory deficit.     Motor: No weakness, tremor, atrophy, abnormal muscle tone, seizure activity or pronator drift.     Coordination: Coordination is intact.     Gait: Gait abnormal and tandem walk abnormal.  Psychiatric:         Speech: Speech normal.        Behavior: Behavior normal.     Assessment & Plan:   Problem List Items Addressed This Visit   None Outpatient Encounter Medications as of 02/28/2023  Medication Sig   albuterol (VENTOLIN HFA) 108 (90 Base) MCG/ACT inhaler Inhale 2 puffs into the lungs every 6 (six) hours as needed for wheezing or shortness of breath.   amLODipine (NORVASC) 10 MG tablet Take 1  tablet (10 mg total) by mouth daily.   aspirin 325 MG tablet Take 1 tablet (325 mg total) by mouth daily.   atorvastatin (LIPITOR) 80 MG tablet Take 1 tablet (80 mg total) by mouth every evening.   carvedilol (COREG) 25 MG tablet Take 1 tablet (25 mg total) by mouth 2 (two) times daily with a meal.   cyanocobalamin (VITAMIN B12) 1000 MCG tablet Take 1 tablet (1,000 mcg total) by mouth once daily.   cyclobenzaprine (FLEXERIL) 10 MG tablet Take 1 tablet (10 mg total) by mouth 3 (three) times daily as needed for muscle spasms.   dapagliflozin propanediol (FARXIGA) 10 MG TABS tablet Take 1 tablet (10 mg total) by mouth daily before breakfast.   Dulaglutide (TRULICITY) 0.75 MG/0.5ML SOPN Inject 0.75 mg into the skin every Friday.   fluticasone-salmeterol (ADVAIR DISKUS) 100-50 MCG/ACT AEPB Inhale 1 puff into the lungs 2 (two) times daily.   glucose blood (TRUE METRIX BLOOD GLUCOSE TEST) test strip Use to check blood sugar three times daily.   insulin glargine (LANTUS SOLOSTAR) 100 UNIT/ML Solostar Pen Inject 20 Units into the skin daily.   Insulin Pen Needle (PEN NEEDLES) 31G X 5 MM MISC use as directed in the morning, at noon, in the evening, and at bedtime.   metFORMIN (GLUCOPHAGE) 500 MG tablet Take 2 tablets (1,000 mg total) by mouth daily with breakfast.   pantoprazole (PROTONIX) 40 MG tablet Take 1 tablet (40 mg total) by mouth once daily.   polyethylene glycol powder (GLYCOLAX/MIRALAX) 17 GM/SCOOP powder Take 17 g by mouth 2 (two) times daily as needed.   thiamine (VITAMIN B1) 100 MG tablet Take  1 tablet (100 mg total) by mouth once daily.   valsartan-hydrochlorothiazide (DIOVAN-HCT) 320-25 MG tablet Take 1 tablet by mouth once daily.   No facility-administered encounter medications on file as of 02/28/2023.  Follow-up: No follow-ups on file.   [name], MD

## 2023-02-28 ENCOUNTER — Ambulatory Visit: Payer: Medicaid Other | Attending: Critical Care Medicine | Admitting: Critical Care Medicine

## 2023-02-28 ENCOUNTER — Other Ambulatory Visit: Payer: Self-pay

## 2023-02-28 ENCOUNTER — Encounter: Payer: Self-pay | Admitting: Critical Care Medicine

## 2023-02-28 ENCOUNTER — Other Ambulatory Visit: Payer: Self-pay | Admitting: Pharmacist

## 2023-02-28 VITALS — BP 128/80 | HR 77 | Wt 181.2 lb

## 2023-02-28 DIAGNOSIS — K029 Dental caries, unspecified: Secondary | ICD-10-CM | POA: Diagnosis not present

## 2023-02-28 DIAGNOSIS — E1169 Type 2 diabetes mellitus with other specified complication: Secondary | ICD-10-CM

## 2023-02-28 DIAGNOSIS — Z8673 Personal history of transient ischemic attack (TIA), and cerebral infarction without residual deficits: Secondary | ICD-10-CM | POA: Insufficient documentation

## 2023-02-28 DIAGNOSIS — J42 Unspecified chronic bronchitis: Secondary | ICD-10-CM

## 2023-02-28 DIAGNOSIS — Z760 Encounter for issue of repeat prescription: Secondary | ICD-10-CM | POA: Diagnosis not present

## 2023-02-28 DIAGNOSIS — Z7985 Long-term (current) use of injectable non-insulin antidiabetic drugs: Secondary | ICD-10-CM | POA: Diagnosis not present

## 2023-02-28 DIAGNOSIS — B351 Tinea unguium: Secondary | ICD-10-CM

## 2023-02-28 DIAGNOSIS — Z6830 Body mass index (BMI) 30.0-30.9, adult: Secondary | ICD-10-CM | POA: Insufficient documentation

## 2023-02-28 DIAGNOSIS — F17210 Nicotine dependence, cigarettes, uncomplicated: Secondary | ICD-10-CM | POA: Insufficient documentation

## 2023-02-28 DIAGNOSIS — Z7984 Long term (current) use of oral hypoglycemic drugs: Secondary | ICD-10-CM | POA: Insufficient documentation

## 2023-02-28 DIAGNOSIS — Z8616 Personal history of COVID-19: Secondary | ICD-10-CM | POA: Diagnosis not present

## 2023-02-28 DIAGNOSIS — E669 Obesity, unspecified: Secondary | ICD-10-CM | POA: Diagnosis not present

## 2023-02-28 DIAGNOSIS — Z5941 Food insecurity: Secondary | ICD-10-CM | POA: Diagnosis not present

## 2023-02-28 DIAGNOSIS — Z79899 Other long term (current) drug therapy: Secondary | ICD-10-CM | POA: Diagnosis not present

## 2023-02-28 DIAGNOSIS — Z794 Long term (current) use of insulin: Secondary | ICD-10-CM | POA: Diagnosis not present

## 2023-02-28 DIAGNOSIS — Z720 Tobacco use: Secondary | ICD-10-CM

## 2023-02-28 DIAGNOSIS — L84 Corns and callosities: Secondary | ICD-10-CM

## 2023-02-28 DIAGNOSIS — I10 Essential (primary) hypertension: Secondary | ICD-10-CM

## 2023-02-28 DIAGNOSIS — E785 Hyperlipidemia, unspecified: Secondary | ICD-10-CM | POA: Diagnosis not present

## 2023-02-28 DIAGNOSIS — E1165 Type 2 diabetes mellitus with hyperglycemia: Secondary | ICD-10-CM | POA: Diagnosis not present

## 2023-02-28 LAB — POCT GLYCOSYLATED HEMOGLOBIN (HGB A1C): HbA1c, POC (controlled diabetic range): 9.4 % — AB (ref 0.0–7.0)

## 2023-02-28 MED ORDER — ACCU-CHEK GUIDE W/DEVICE KIT
PACK | 0 refills | Status: DC
  Filled 2023-02-28: qty 1, fill #0
  Filled 2023-03-01 – 2023-06-16 (×4): qty 1, 30d supply, fill #0

## 2023-02-28 MED ORDER — ACCU-CHEK SOFTCLIX LANCETS MISC
6 refills | Status: DC
  Filled 2023-02-28: qty 100, fill #0
  Filled 2023-03-01: qty 100, 33d supply, fill #0
  Filled 2023-03-14: qty 100, 34d supply, fill #0
  Filled 2023-04-14 – 2023-06-16 (×2): qty 100, 33d supply, fill #0

## 2023-02-28 MED ORDER — PEN NEEDLES 31G X 5 MM MISC
4.0000 | Freq: Four times a day (QID) | 1 refills | Status: DC
  Filled 2023-02-28 – 2023-04-14 (×3): qty 100, 25d supply, fill #0
  Filled 2023-06-16: qty 200, 50d supply, fill #0

## 2023-02-28 MED ORDER — VITAMIN B-12 1000 MCG PO TABS
1000.0000 ug | ORAL_TABLET | Freq: Every day | ORAL | 2 refills | Status: DC
  Filled 2023-02-28 – 2023-06-16 (×3): qty 130, 130d supply, fill #0

## 2023-02-28 MED ORDER — ATORVASTATIN CALCIUM 80 MG PO TABS
80.0000 mg | ORAL_TABLET | Freq: Every evening | ORAL | 2 refills | Status: DC
  Filled 2023-02-28 – 2023-06-16 (×4): qty 90, 90d supply, fill #0

## 2023-02-28 MED ORDER — VALSARTAN-HYDROCHLOROTHIAZIDE 320-25 MG PO TABS
1.0000 | ORAL_TABLET | Freq: Every day | ORAL | 3 refills | Status: DC
  Filled 2023-02-28 – 2023-03-14 (×2): qty 60, 60d supply, fill #0
  Filled 2023-04-14: qty 30, 30d supply, fill #0
  Filled 2023-06-16: qty 60, 60d supply, fill #0

## 2023-02-28 MED ORDER — DAPAGLIFLOZIN PROPANEDIOL 10 MG PO TABS
10.0000 mg | ORAL_TABLET | Freq: Every day | ORAL | 2 refills | Status: DC
  Filled 2023-02-28: qty 30, 30d supply, fill #0
  Filled 2023-03-14 – 2023-06-16 (×3): qty 90, 90d supply, fill #0

## 2023-02-28 MED ORDER — FLUTICASONE-SALMETEROL 100-50 MCG/ACT IN AEPB
1.0000 | INHALATION_SPRAY | Freq: Two times a day (BID) | RESPIRATORY_TRACT | 2 refills | Status: DC
  Filled 2023-02-28 – 2023-06-16 (×4): qty 60, 30d supply, fill #0

## 2023-02-28 MED ORDER — PANTOPRAZOLE SODIUM 40 MG PO TBEC
40.0000 mg | DELAYED_RELEASE_TABLET | Freq: Every day | ORAL | 2 refills | Status: DC
  Filled 2023-02-28 – 2023-03-14 (×2): qty 90, 90d supply, fill #0
  Filled 2023-04-14: qty 30, 30d supply, fill #0
  Filled 2023-06-16: qty 90, 90d supply, fill #0

## 2023-02-28 MED ORDER — LANTUS SOLOSTAR 100 UNIT/ML ~~LOC~~ SOPN
20.0000 [IU] | PEN_INJECTOR | Freq: Every day | SUBCUTANEOUS | 2 refills | Status: DC
  Filled 2023-02-28 – 2023-06-16 (×4): qty 15, 75d supply, fill #0

## 2023-02-28 MED ORDER — ACCU-CHEK GUIDE VI STRP
ORAL_STRIP | 6 refills | Status: DC
  Filled 2023-02-28: qty 100, 25d supply, fill #0
  Filled 2023-03-01: qty 100, 33d supply, fill #0
  Filled 2023-03-14: qty 100, 34d supply, fill #0
  Filled 2023-04-14 – 2023-06-16 (×2): qty 100, 33d supply, fill #0

## 2023-02-28 MED ORDER — CARVEDILOL 25 MG PO TABS
25.0000 mg | ORAL_TABLET | Freq: Two times a day (BID) | ORAL | 3 refills | Status: DC
  Filled 2023-02-28 – 2023-03-14 (×2): qty 120, 60d supply, fill #0
  Filled 2023-04-14: qty 60, 30d supply, fill #0
  Filled 2023-06-16: qty 120, 60d supply, fill #0

## 2023-02-28 MED ORDER — TRUE METRIX BLOOD GLUCOSE TEST VI STRP
ORAL_STRIP | 0 refills | Status: DC
  Filled 2023-02-28: qty 100, 33d supply, fill #0

## 2023-02-28 MED ORDER — CYCLOBENZAPRINE HCL 10 MG PO TABS
10.0000 mg | ORAL_TABLET | Freq: Three times a day (TID) | ORAL | 0 refills | Status: DC | PRN
  Filled 2023-02-28: qty 90, 30d supply, fill #0
  Filled 2023-04-14: qty 63, 21d supply, fill #0
  Filled 2023-06-16: qty 62, 21d supply, fill #0

## 2023-02-28 MED ORDER — METFORMIN HCL 500 MG PO TABS
1000.0000 mg | ORAL_TABLET | Freq: Every day | ORAL | 1 refills | Status: DC
  Filled 2023-02-28 – 2023-03-14 (×2): qty 120, 60d supply, fill #0
  Filled 2023-04-14: qty 60, 30d supply, fill #0
  Filled 2023-06-16: qty 120, 60d supply, fill #0

## 2023-02-28 MED ORDER — THIAMINE HCL 100 MG PO TABS
100.0000 mg | ORAL_TABLET | Freq: Every day | ORAL | 0 refills | Status: DC
  Filled 2023-02-28 – 2023-06-16 (×2): qty 100, 100d supply, fill #0

## 2023-02-28 MED ORDER — ALBUTEROL SULFATE HFA 108 (90 BASE) MCG/ACT IN AERS
2.0000 | INHALATION_SPRAY | Freq: Four times a day (QID) | RESPIRATORY_TRACT | 0 refills | Status: DC | PRN
  Filled 2023-02-28 – 2023-04-14 (×2): qty 18, 25d supply, fill #0

## 2023-02-28 MED ORDER — TRULICITY 0.75 MG/0.5ML ~~LOC~~ SOAJ
0.7500 mg | SUBCUTANEOUS | 4 refills | Status: DC
  Filled 2023-02-28 – 2023-06-16 (×4): qty 2, 28d supply, fill #0

## 2023-02-28 MED ORDER — AMLODIPINE BESYLATE 10 MG PO TABS
10.0000 mg | ORAL_TABLET | Freq: Every day | ORAL | 2 refills | Status: DC
  Filled 2023-02-28 – 2023-06-16 (×4): qty 90, 90d supply, fill #0

## 2023-02-28 NOTE — Assessment & Plan Note (Signed)
Type 2 diabetes has slipped out of control plan is to increase insulin glargine to 20 units daily continue with metformin at 1000 mg daily continue Trulicity 0.75 weekly

## 2023-02-28 NOTE — Assessment & Plan Note (Signed)
Continue with atorvastatin 

## 2023-02-28 NOTE — Assessment & Plan Note (Signed)
Referral back to podiatry made 

## 2023-02-28 NOTE — Assessment & Plan Note (Signed)
  .   Current smoking consumption amount:  10 cigarettes a day  . Dicsussion on advise to quit smoking and smoking impacts: Lung cardiovascular impacts  . Patient's willingness to quit: Willing to quit  . Methods to quit smoking discussed: Behavioral modification  . Medication management of smoking session drugs discussed: Nicotine replacement  . Resources provided:  AVS   . Setting quit date not established  . Follow-up arranged 4 months   Time spent counseling the patient: 5 minutes  

## 2023-02-28 NOTE — Assessment & Plan Note (Signed)
Hypertension well-controlled will continue with amlodipine daily and valsartan HCT check metabolic panel

## 2023-02-28 NOTE — Assessment & Plan Note (Signed)
Continue inhalers

## 2023-02-28 NOTE — Assessment & Plan Note (Signed)
Referral to dentistry. 

## 2023-02-28 NOTE — Patient Instructions (Signed)
Refills sent to our pharmacy [facility] to be obtained today Referrals to dental and foot doctor sent Return Dr Delford Field 5 months

## 2023-03-01 ENCOUNTER — Telehealth: Payer: Self-pay

## 2023-03-01 ENCOUNTER — Other Ambulatory Visit: Payer: Self-pay

## 2023-03-01 LAB — RENAL FUNCTION PANEL
Albumin: 4.2 g/dL (ref 3.8–4.9)
BUN/Creatinine Ratio: 10 (ref 9–20)
BUN: 13 mg/dL (ref 6–24)
CO2: 24 mmol/L (ref 20–29)
Calcium: 9.7 mg/dL (ref 8.7–10.2)
Chloride: 102 mmol/L (ref 96–106)
Creatinine, Ser: 1.24 mg/dL (ref 0.76–1.27)
Glucose: 92 mg/dL (ref 70–99)
Phosphorus: 3.5 mg/dL (ref 2.8–4.1)
Potassium: 3.9 mmol/L (ref 3.5–5.2)
Sodium: 145 mmol/L — ABNORMAL HIGH (ref 134–144)
eGFR: 68 mL/min/{1.73_m2} (ref >59)

## 2023-03-01 NOTE — Progress Notes (Signed)
Let pt know kidney lliver normal

## 2023-03-01 NOTE — Telephone Encounter (Signed)
-----   Message from [name], MD sent at 03/01/2023  5:45 AM EDT ----- Let pt know kidney lliver normal

## 2023-03-01 NOTE — Telephone Encounter (Signed)
Pt was called and is aware of results, DOB was confirmed.  ?

## 2023-03-02 LAB — MICROALBUMIN / CREATININE URINE RATIO
Creatinine, Urine: 37.8 mg/dL
Microalb/Creat Ratio: 512 mg/g creat — ABNORMAL HIGH (ref 0–29)
Microalbumin, Urine: 193.4 ug/mL

## 2023-03-02 NOTE — Progress Notes (Signed)
Let pt know there is protein in urine but better than before , kidney stable

## 2023-03-07 ENCOUNTER — Other Ambulatory Visit: Payer: Self-pay

## 2023-03-07 ENCOUNTER — Telehealth: Payer: Self-pay

## 2023-03-07 NOTE — Telephone Encounter (Signed)
-----   Message from [name], MD sent at 03/02/2023  5:43 PM EDT ----- Let pt know there is protein in urine but better than before , kidney stable

## 2023-03-07 NOTE — Telephone Encounter (Signed)
Pt was called and vm was left, Information has been sent to nurse pool.   

## 2023-03-14 ENCOUNTER — Other Ambulatory Visit: Payer: Self-pay

## 2023-03-20 ENCOUNTER — Other Ambulatory Visit: Payer: Self-pay

## 2023-03-21 ENCOUNTER — Other Ambulatory Visit: Payer: Self-pay

## 2023-03-27 ENCOUNTER — Other Ambulatory Visit: Payer: Self-pay

## 2023-03-29 ENCOUNTER — Other Ambulatory Visit: Payer: Self-pay

## 2023-03-30 ENCOUNTER — Other Ambulatory Visit: Payer: Self-pay

## 2023-04-06 ENCOUNTER — Ambulatory Visit: Payer: Self-pay

## 2023-04-06 NOTE — Telephone Encounter (Signed)
Message from [place] sent at 04/06/2023  3:50 PM EDT  Summary: Pt requests Rx for pain medication for his back   Pt stated he has been having back pain for the last week and requests that a Rx for a pain medication be sent to his pharmacy. Cb# [id number]         Chief Complaint: 10 days of severe back pain  Symptoms: lower back pain Frequency: 10 days  Pertinent Negatives: Patient denies pt hung up Disposition: [] ED /[] Urgent Care (no appt availability in office) / [] Appointment(In office/virtual)/ []  [facility]/ [x] Home Care/ [] Refused Recommended Disposition /[] [facility]/ []  Follow-up with PCP Additional Notes: sending this note and sent Teams message to office Elonda Husky)  Advised pt will need appt. Pt got mad stated he will go to ED and pt hung up  Reason for Disposition  Caller hangs up  Answer Assessment - Initial Assessment Questions 1. ONSET: "When did the pain begin?"      10 days 2. LOCATION: "Where does it hurt?" (upper, mid or lower back)     *No Answer* 3. SEVERITY: "How bad is the pain?"  (e.g., Scale 1-10; mild, moderate, or severe)   - MILD (1-3): Doesn't interfere with normal activities.    - MODERATE (4-7): Interferes with normal activities or awakens from sleep.    - SEVERE (8-10): Excruciating pain, unable to do any normal activities.      *No Answer* 4. PATTERN: "Is the pain constant?" (e.g., yes, no; constant, intermittent)      *No Answer* 5. RADIATION: "Does the pain shoot into your legs or somewhere else?"     *No Answer* 6. CAUSE:  "What do you think is causing the back pain?"      *No Answer* 7. BACK OVERUSE:  "Any recent lifting of heavy objects, strenuous work or exercise?"     *No Answer* 8. MEDICINES: "What have you taken so far for the pain?" (e.g., nothing, acetaminophen, NSAIDS)     *No Answer* 9. NEUROLOGIC SYMPTOMS: "Do you have any weakness, numbness, or problems with bowel/bladder control?"     *No  Answer* 10. OTHER SYMPTOMS: "Do you have any other symptoms?" (e.g., fever, abdomen pain, burning with urination, blood in urine)       *No Answer* 11. PREGNANCY: "Is there any chance you are pregnant?" "When was your last menstrual period?"       *No Answer*  Answer Assessment - Initial Assessment Questions 1. SITUATION:  Document reason for call.     Pt asking for PCP to call in pain med /advised will need appt 3. ASSESSMENT: Document your nursing assessment.     10 days of severe back pain 4. RESPONSE: Document what your response or recommendation was.     Pt got mad and hung up.  Protocols used: Back Pain-A-AH, Difficult Call-A-AH

## 2023-04-07 NOTE — Telephone Encounter (Signed)
Routing to PCP

## 2023-04-14 ENCOUNTER — Other Ambulatory Visit: Payer: Self-pay

## 2023-04-21 ENCOUNTER — Other Ambulatory Visit: Payer: Self-pay

## 2023-04-24 ENCOUNTER — Other Ambulatory Visit: Payer: Self-pay

## 2023-05-15 ENCOUNTER — Other Ambulatory Visit (HOSPITAL_COMMUNITY): Payer: Self-pay

## 2023-06-16 ENCOUNTER — Other Ambulatory Visit: Payer: Self-pay

## 2023-06-19 ENCOUNTER — Other Ambulatory Visit: Payer: Self-pay

## 2023-06-23 ENCOUNTER — Other Ambulatory Visit: Payer: Self-pay

## 2023-06-26 ENCOUNTER — Other Ambulatory Visit: Payer: Self-pay

## 2023-07-24 ENCOUNTER — Other Ambulatory Visit: Payer: Self-pay

## 2023-07-30 NOTE — Progress Notes (Unsigned)
New Patient Office Visit  Subjective:  Patient ID: Stanley Stone, male    DOB: Mar 22, 1966  Age: 57 y.o. MRN: [id number]  Chief Complaint  Patient presents with   Medical Management of Chronic Issues     HPI 01/18/22 Stanley Stone presents for to establish care.  I have not seen this patient since March 2020.  This patient is homeless he lives in an abandoned house in [place].  There is no electricity or heat.  He is sleeping on the floor.  Patient has severe hypertension and type 2 diabetes.  On arrival blood pressure 180/105.  He was given prescription last week for Zestoretic along with metformin and atorvastatin.  Patient's been having significant dizziness emesis epigastric abdominal pain some blood intermixed in the emesis.  He drinks about 1 beer a day.  If he stops drinking beer he feels he might go into delirium tremens.  He has a history of COPD and does have a chronic cough with some wheezing.  On arrival A1c is 9.1 blood sugar is 232.  He does not have a meter currently to measure his blood sugar.  He eats a lot of carbohydrates because he gets food from charts of food pantry's that have largely carbohydrate-based meals.  Patient does note shortness of breath with exertion.  He does not have any rectal bleeding.  He notes some numbness in the hands and in the feet.  04/05/22 This patient was seen last week at the [facility] today's visit is a posthospital visit for medication refills. Below is documentation from the last visit Seen 5/17 at [name]: This is a transition of care visit which occurred in the [facility].  This is a 57 year old male primary care patient of mine who is seen post hospital for recent left MCA stroke.  He was just discharged this week.  Below is a copy of the discharge summary   Date of Admission: 03/25/2022 Date of Discharge: 03/28/2022  Tests Needing Follow-up: -needs outpatient NCS/EMG as recommended by stroke team - this  should be arranged through outpatient follow-up with neurology -avoid overcorrection of BP due to CVD and the need to assure adequate brain perfusion   -B12 should be rechecked in 8-12 weeks to assure he is able to absorb it orally    Discharge Diagnoses: Left brain hypoperfusion versus TIA Left MCA occlusion/large vessel disease R arm weakness 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 with right foraminal impingement and right ventral cord indentation Vitamin B12 deficiency Hypokalemia DM2 COPD with chronic bronchitis  Tobacco use Homelessness History of alcohol abuse HTN   Initial presentation: 57yo homeless gentleman with a history of poor visual acuity (blind in right eye, blurry vision in left) HTN, DM 2, tobacco abuse, and alcohol abuse who presented to the ER with a 1 week history of right hand weakness.  Since the initial onset it has waxed and waned in an unpredictable fashion.   Hospital Course:   Left brain hypoperfusion versus TIA due to left MCA occlusion/large vessel disease - R arm weakness -CT head without acute findings -MRI brain negative for acute/subacute CVA -mild chronic small vessel ischemic disease noted with cerebral and cerebellar atrophy -MRa head notes severe intracranial atherosclerosis with a left MCA occlusion -MRa neck negative -MRI C-spine notes cervical spine 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 with right foraminal impingement and right ventral cord indentation -TTE notes EF 60-65% with normal LV function and no WMA with grade 1 DD and  no intracardiac source of embolism appreciable -LDL 76 -continue Lipitor without change -A1c 8.2 Neurology recommending ASA 325 mg plus Plavix for 3 months then ASA alone, and avoidance of hypotension   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 with right foraminal impingement and right ventral cord indentation [name] - Neurology  recommending outpatient EMGs/NCS -symptoms have spontaneously resolved during his hospital stay   Vitamin B12 deficiency Vitamin B12 quite low at 228 - supplemented with subcutaneous load while inpatient, and oral replacement after discharge - B12 should be rechecked in 8-12 weeks to assure he is able to absorb it orally    Hypokalemia Likely due to poor nutritional status -corrected with supplementation   DM2 A1c 8.2 -CBG well controlled as inpatient   COPD with chronic bronchitis  No acute exacerbation   Tobacco use Discussed need for absolute tobacco cessation with patient -utilized nicotine patch to assist w/ cessation    Homelessness Chronically homeless. Pt lives in a tent. TOC/CSW assisted with maximizing outpatient f/u and resources. All new meds provided to patient at bedside from [facility] prior to d/c home.    History of alcohol abuse Pt has not had any alcohol in 5 days -no evidence of withdrawal during this admission - encouraged continued abstinence    HTN Avoid overcorrection/hypotension in setting of significant intracranial vascular disease       This patient had been living in a tent in [facility] he now is in the [facility] thanks to [employer].  The patient was discharged with out his insulin and also without any aspirin.  He did receive 3 transition of care pharmacy all of his other medications.  Patient is still struggling with partial aphasia and left-sided weakness.  He was not established with any physical therapy or speech therapy post hospital as he is uninsured.   On exam blood pressure is elevated 165/81 saturation 97% pulse 86 patient demonstrates aphasia he has right-sided upper extremity weakness Chest is clear Cardiac exam unremarkable   Impression is that of recent MCA stroke you will need access to his insulin products he will need post hospital follow-up in my clinic as soon as possible we will establish this  We did give him  samples of 325 mg aspirin all his other medications are accessible and have been reconciled We will endeavor to see if we can get this patient physical therapy and speech therapy through some type of grant program as he is uninsured    Today in the clinic the patient has difficulty with memory still has trouble with ambulation using a rollator has a follow-up appointment June 6 at rehab for physical therapy and speech-language pathology.  On arrival blood pressure is elevated 150/92.  The patient has not been taking his medications recently.  He is also been skipping his metformin.  Blood sugar today is elevated.  He is only taking 10 units of Lantus insulin daily.  He is pending Financial controller for [employer].  He is smoking 5 cigarettes daily at this time.  He is only on Zestoretic 20/25 daily.   8/14 Patient seen today in return follow-up on arrival blood pressure is good 136/80.  His symptoms are from stroke are improving.  He is still using a walker.  He still has some neck spasm and the cyclobenzaprine ran out.  On arrival blood sugar 289 A1c is 8.5.  He is still smoking about  1/2 pack a day of cigarettes.  He denies any cough or mucus.  He is now off Plavix after 3 months following his stroke.  He does continue with aspirin 325 mg daily.  Patient did have visit with disability determination physician who said he stands a good chance of getting disability.  There are no other complaints other than he has trouble staying asleep.  He goes to bed early at 830 and awakens at 2:30 in the morning.  He has a hard time going back to sleep.  He does urinate frequently at night.  9/20 this is a transition of care visit.  Visit occurring 7 days from admission. This is a return hospital follow-up office visit.  Patient had admission in September 5 through 13 for COVID colitis.  Below is a copy of the discharge summary.  A nurse case management TOC visit is already occurred Admit date:  07/19/2022 Discharge date: 07/27/2022 Admitted From: Homeless shelter Disposition: Homeless shelter Recommendations for Outpatient Follow-up:  Follow up with PCP in in 1 to 2 weeks Check BMP and CBC at follow-up none Please follow up on the following pending results: None   Home Health: Not indicated Equipment/Devices: Not indictated    Hospital course 57 year old M with PMH of IDDM-2, CVA, HLD and tobacco use disorder presenting with nausea, vomiting and diarrhea and found to have AKI and gastroenteritis in the setting of COVID-19 infection.  He was managed conservatively with IV fluid and symptoms resolved.  He was treated with molnupiravir for COVID-19 infection.  He was medically stable for discharge as of 07/21/2022 but was not able to go back to shelter due to COVID-19.  His repeat test on 9/12 was negative.  He was discharged back to homeless shelter on 07/27/2022.  Patient states he has all his medication and shelter and did not need refills on his prescriptions.   At the time of discharge, AKI resolved.  GI symptoms resolved.   Patient does advised to quit smoking cigarettes but not ready.   See individual problem list below for more.    Problems addressed during this hospitalization Principal Problem:   AKI (acute kidney injury) (HCC) Active Problems:   COVID-19 virus infection   DM (diabetes mellitus), type 2 (HCC)   HTN (hypertension)   Homelessness   Tobacco use   History of ischemic right MCA stroke with residual deficit   Hyperlipidemia associated with type 2 diabetes mellitus (HCC)   Gastroenteritis   Since discharge patient's been doing well blood sugars in the 130s to 140s range.  Patient declines to receive the flu vaccine at this visit.  Patient's breathing is better his abdominal status is improved constipation has resolved.  He notes his appetite is decreased on Trulicity but it appears to be working.  He continues to take his Lantus daily and also takes the [place]  daily.  He has refills today.  Blood pressure is improved on valsartan HCT and amlodipine.  Blood pressure today on arrival 101/69. The patient continues to work on disability and Medicaid applications  Patient is improved with vision in the left eye and is walking better as not needing a rollator and has recovered well from stroke  12/14 Patient is seen in return follow-up.  He now has housing living with his daughter in [place].  He has been compliant with his medications.  He does have poor dentition would like a dental referral.  He now has Medicaid and has full disability.  He is  down to quarter pack of cigarettes a day.  He would like a refill on Flexeril.  On arrival A1c is down to 6.5 and blood pressure is good at 128/78.  02/28/23 Patient seen in follow-up from December visit.  Arrival blood pressure is 128/80.  A1c is 6.4 in the past but now is up to 9.4.  This is despite a blood sugar on arrival of 133.  Patient still living with his daughter in [place].  Patient needs a dental exam and a foot exam in [facility].  There is no other complaints.  He needs a urine for albumin today  9/17 Patient presents in clinic for 5 month follow up diabetes, and hypertension. He reports he has his own place now. His vitals today is BP 156/78, CBG 306, and A1C > 15. Patient says his vision is worsening. He endorses being able to read "close up". He reports missing doses of medication because he can not see the labels on his prescription bottle.  He also has difficulty seeing numbers on blood glucose monitor.Patient has been declared legally blind. His insurance provides transportation to medical appointments. Patient says he smokes 1/2 pack of cigarettes daily.  Past Medical History:  Diagnosis Date   AKI (acute kidney injury) (HCC) 07/20/2022   Closed fracture dislocation of lumbar spine (HCC) 1987   COPD (chronic obstructive pulmonary disease) (HCC)    COVID-19 virus infection 07/20/2022   CVA  (cerebral vascular accident) (HCC) 03/25/2022   Diabetes mellitus without complication (HCC)    GERD (gastroesophageal reflux disease)    High cholesterol    Homelessness 02/07/2019   Hypertension     History reviewed. No pertinent surgical history.  History reviewed. No pertinent family history.  Social History   Socioeconomic History   Marital status: Divorced    Spouse name: Not on file   Number of children: Not on file   Years of education: Not on file   Highest education level: Not on file  Occupational History   Not on file  Tobacco Use   Smoking status: Every Day    Current packs/day: 0.50    Types: Cigarettes   Smokeless tobacco: Never  Vaping Use   Vaping status: Never Used  Substance and Sexual Activity   Alcohol use: Yes   Drug use: Not Currently   Sexual activity: Not Currently  Other Topics Concern   Not on file  Social History Narrative   Not on file   Social Determinants of Health   Financial Resource Strain: Not on file  Food Insecurity: Medium Risk (07/22/2023)   Received from [facility]   Hunger Vital Sign    Worried About Running Out of Food in the Last Year: Sometimes true    Ran Out of Food in the Last Year: Sometimes true  Transportation Needs: No Transportation Needs (07/22/2023)   Received from [facility]    In the past 12 months, has lack of reliable transportation kept you from medical appointments, meetings, work or from getting things needed for daily living? : No  Physical Activity: Not on file  Stress: Not on file  Social Connections: Not on file  Intimate Partner Violence: Not on file    ROS Review of Systems  Constitutional: Negative.  Negative for fatigue and fever.  HENT:  Positive for dental problem. Negative for congestion, drooling, ear pain, facial swelling, postnasal drip, rhinorrhea, sinus pressure, sneezing, sore throat, tinnitus, trouble swallowing and voice change.  Multiple dental caries  and severely damaged teeth  Eyes:  Positive for visual disturbance.       Vision worsening  Respiratory: Negative.  Negative for apnea, cough, choking, chest tightness, shortness of breath, wheezing and stridor.   Cardiovascular: Negative.  Negative for chest pain, palpitations and leg swelling.  Gastrointestinal: Negative.  Negative for abdominal distention, abdominal pain, anal bleeding, blood in stool, constipation, diarrhea, nausea, rectal pain and vomiting.  Endocrine: Negative.  Negative for polydipsia, polyphagia and polyuria.  Musculoskeletal: Negative.  Negative for arthralgias, gait problem and myalgias.  Skin: Negative.  Negative for rash.  Allergic/Immunologic: Negative.  Negative for environmental allergies and food allergies.  Neurological:  Negative for dizziness, syncope, speech difficulty, weakness, light-headedness and headaches.  Hematological: Negative.  Negative for adenopathy. Does not bruise/bleed easily.  Psychiatric/Behavioral: Negative.  Negative for agitation, decreased concentration, dysphoric mood, hallucinations, self-injury, sleep disturbance and suicidal ideas. The patient is not nervous/anxious and is not hyperactive.     Objective:   Today's Vitals: BP (!) 156/78   Physical Exam Vitals reviewed.  Constitutional:      Appearance: Normal appearance. He is well-developed. He is obese. He is not diaphoretic.  HENT:     Head: Normocephalic and atraumatic.     Nose: Nose normal. No nasal deformity, septal deviation, mucosal edema or rhinorrhea.     Right Sinus: No maxillary sinus tenderness or frontal sinus tenderness.     Left Sinus: No maxillary sinus tenderness or frontal sinus tenderness.     Mouth/Throat:     Mouth: Mucous membranes are moist.     Dentition: Dental caries present.     Pharynx: Oropharynx is clear. Uvula midline. No oropharyngeal exudate.     Comments: Poor dentition. Missing teeth Eyes:     General: No scleral icterus.     Conjunctiva/sclera: Conjunctivae normal.     Pupils: Pupils are equal, round, and reactive to light.  Neck:     Thyroid: No thyromegaly.     Vascular: No carotid bruit or JVD.     Trachea: Trachea normal. No tracheal tenderness or tracheal deviation.  Cardiovascular:     Rate and Rhythm: Normal rate.     Chest Wall: PMI is not displaced.     Pulses: Normal pulses. No decreased pulses.     Heart sounds: S1 normal and S2 normal. Heart sounds not distant. No murmur heard.    No systolic murmur is present.     No diastolic murmur is present.     No friction rub. No gallop. No S3 or S4 sounds.     Comments: Occasional missed beats. Negative carotid bruits Pulmonary:     Effort: Pulmonary effort is normal. No tachypnea, accessory muscle usage or respiratory distress.     Breath sounds: Normal breath sounds. No stridor. No decreased breath sounds, wheezing, rhonchi or rales.  Chest:     Chest wall: No tenderness.  Abdominal:     General: Bowel sounds are normal. There is no distension.     Palpations: Abdomen is soft. Abdomen is not rigid. There is no mass.     Tenderness: There is no abdominal tenderness. There is no right CVA tenderness, left CVA tenderness, guarding or rebound.     Hernia: No hernia is present.  Musculoskeletal:        General: Normal range of motion.     Cervical back: Normal range of motion and neck supple. No edema, erythema or rigidity. No muscular tenderness. Normal range of  motion.     Comments: Severe onychomycosis and foot callus on great toes  Lymphadenopathy:     Head:     Right side of head: No submental or submandibular adenopathy.     Left side of head: No submental or submandibular adenopathy.     Cervical: No cervical adenopathy.  Skin:    General: Skin is warm and dry.     Coloration: Skin is not pale.     Findings: No rash.     Nails: There is no clubbing.  Neurological:     Mental Status: He is alert and oriented to person, place, and time.      Cranial Nerves: Cranial nerves 2-12 are intact.     Sensory: Sensation is intact. No sensory deficit.     Motor: No weakness, tremor, atrophy, abnormal muscle tone, seizure activity or pronator drift.     Coordination: Coordination is intact.  Psychiatric:        Speech: Speech normal.        Behavior: Behavior normal.     Assessment & Plan:   Problem List Items Addressed This Visit       Cardiovascular and Mediastinum   HTN (hypertension)    BP 156/78. Patient says missing doses of medications due to vision. Continue previously ordered medications. Will attempt to obtain patient assistance to fill medication box.       Relevant Medications   amLODipine (NORVASC) 10 MG tablet   carvedilol (COREG) 25 MG tablet   atorvastatin (LIPITOR) 80 MG tablet   valsartan-hydrochlorothiazide (DIOVAN-HCT) 320-25 MG tablet   Other Relevant Orders   Ambulatory Referral to Primary Care (Establish Care)     Digestive   Dental caries    Poor dentition, dental caries, and missing teeth noted. Referral placed for dental appointment.      Relevant Orders   Ambulatory referral to Dentistry     Endocrine   DM (diabetes mellitus), type 2 (HCC) - Primary    A1C > 15, CBG 306. Will order talking CBG meter. Increase Trulicity to 1.5 mg [place] weekly, and Lantus to 30 units daily.       Relevant Medications   dapagliflozin propanediol (FARXIGA) 10 MG TABS tablet   atorvastatin (LIPITOR) 80 MG tablet   valsartan-hydrochlorothiazide (DIOVAN-HCT) 320-25 MG tablet   metFORMIN (GLUCOPHAGE) 500 MG tablet   insulin glargine (LANTUS SOLOSTAR) 100 UNIT/ML Solostar Pen   Dulaglutide (TRULICITY) 1.5 MG/0.5ML SOPN   Other Relevant Orders   HgB A1c (Completed)   Comprehensive metabolic panel   Ambulatory referral to Ophthalmology   Ambulatory referral to Dentistry   POCT glucose (manual entry) (Completed)   Ambulatory Referral to Primary Care (Establish Care)   Hyperlipidemia associated with type 2 diabetes  mellitus (HCC)    Continue atorvastatin      Relevant Medications   dapagliflozin propanediol (FARXIGA) 10 MG TABS tablet   amLODipine (NORVASC) 10 MG tablet   carvedilol (COREG) 25 MG tablet   atorvastatin (LIPITOR) 80 MG tablet   valsartan-hydrochlorothiazide (DIOVAN-HCT) 320-25 MG tablet   metFORMIN (GLUCOPHAGE) 500 MG tablet   insulin glargine (LANTUS SOLOSTAR) 100 UNIT/ML Solostar Pen   Dulaglutide (TRULICITY) 1.5 MG/0.5ML SOPN     Other   Tobacco use       Current smoking consumption amount:  10 cigarettes a day  Dicsussion on advise to quit smoking and smoking impacts: Lung cardiovascular impacts  Patient's willingness to quit: Willing to quit  Methods to quit smoking discussed: Behavioral modification  Medication management of smoking session drugs discussed: Nicotine replacement  Resources provided:  AVS   Setting quit date not established  Follow-up arranged 4 months   Time spent counseling the patient: 5 minutes       History of ischemic right MCA stroke with residual deficit   Relevant Orders   Ambulatory Referral to Primary Care (Establish Care)   Vision loss, bilateral   Other Visit Diagnoses     Colon cancer screening       Relevant Orders   Fecal occult blood, imunochemical   Blindness of right eye, unspecified left eye visual impairment category       Relevant Orders   Ambulatory referral to Ophthalmology       Outpatient Encounter Medications as of 08/01/2023  Medication Sig   albuterol (VENTOLIN HFA) 108 (90 Base) MCG/ACT inhaler Inhale 2 puffs into the lungs every 6 (six) hours as needed for wheezing or shortness of breath.   Dulaglutide (TRULICITY) 1.5 MG/0.5ML SOPN Inject 1.5 mg into the skin once a week.   fluticasone-salmeterol (ADVAIR DISKUS) 100-50 MCG/ACT AEPB Inhale 1 puff into the lungs 2 (two) times daily.   sertraline (ZOLOFT) 50 MG tablet Take 50 mg by mouth daily.   Accu-Chek Softclix Lancets lancets Use to check blood  sugar three times daily.   amLODipine (NORVASC) 10 MG tablet Take 1 tablet (10 mg total) by mouth daily.   aspirin 325 MG tablet Take 1 tablet (325 mg total) by mouth daily.   atorvastatin (LIPITOR) 80 MG tablet Take 1 tablet (80 mg total) by mouth every evening.   Blood Glucose Monitoring Suppl (ACCU-CHEK GUIDE) w/Device KIT Use to check blood sugar three times daily.   carvedilol (COREG) 25 MG tablet Take 1 tablet (25 mg total) by mouth 2 (two) times daily with a meal.   cyanocobalamin (VITAMIN B12) 1000 MCG tablet Take 1 tablet (1,000 mcg total) by mouth once daily.   cyclobenzaprine (FLEXERIL) 10 MG tablet Take 1 tablet (10 mg total) by mouth 3 (three) times daily as needed for muscle spasms.   dapagliflozin propanediol (FARXIGA) 10 MG TABS tablet Take 1 tablet (10 mg total) by mouth daily before breakfast.   glucose blood (ACCU-CHEK GUIDE) test strip Use to check blood sugar three times daily.   insulin glargine (LANTUS SOLOSTAR) 100 UNIT/ML Solostar Pen Inject 30 Units into the skin daily.   Insulin Pen Needle (PEN NEEDLES) 31G X 5 MM MISC use as directed in the morning, at noon, in the evening, and at bedtime.   metFORMIN (GLUCOPHAGE) 500 MG tablet Take 2 tablets (1,000 mg total) by mouth daily with breakfast.   pantoprazole (PROTONIX) 40 MG tablet Take 1 tablet (40 mg total) by mouth once daily.   polyethylene glycol powder (GLYCOLAX/MIRALAX) 17 GM/SCOOP powder Take 17 g by mouth 2 (two) times daily as needed.   thiamine (VITAMIN B1) 100 MG tablet Take 1 tablet (100 mg total) by mouth once daily.   valsartan-hydrochlorothiazide (DIOVAN-HCT) 320-25 MG tablet Take 1 tablet by mouth once daily.   [DISCONTINUED] Accu-Chek Softclix Lancets lancets Use to check blood sugar three times daily.   [DISCONTINUED] albuterol (VENTOLIN HFA) 108 (90 Base) MCG/ACT inhaler Inhale 2 puffs into the lungs every 6 (six) hours as needed for wheezing or shortness of breath.   [DISCONTINUED] amLODipine (NORVASC)  10 MG tablet Take 1 tablet (10 mg total) by mouth daily.   [DISCONTINUED] atorvastatin (LIPITOR) 80 MG tablet Take 1 tablet (80 mg total) by mouth every  evening.   [DISCONTINUED] carvedilol (COREG) 25 MG tablet Take 1 tablet (25 mg total) by mouth 2 (two) times daily with a meal.   [DISCONTINUED] cyanocobalamin (VITAMIN B12) 1000 MCG tablet Take 1 tablet (1,000 mcg total) by mouth once daily.   [DISCONTINUED] cyclobenzaprine (FLEXERIL) 10 MG tablet Take 1 tablet (10 mg total) by mouth 3 (three) times daily as needed for muscle spasms.   [DISCONTINUED] dapagliflozin propanediol (FARXIGA) 10 MG TABS tablet Take 1 tablet (10 mg total) by mouth daily before breakfast.   [DISCONTINUED] Dulaglutide (TRULICITY) 0.75 MG/0.5ML SOPN Inject 0.75 mg into the skin every Friday.   [DISCONTINUED] fluticasone-salmeterol (ADVAIR DISKUS) 100-50 MCG/ACT AEPB Inhale 1 puff into the lungs 2 (two) times daily.   [DISCONTINUED] glucose blood (ACCU-CHEK GUIDE) test strip Use to check blood sugar three times daily.   [DISCONTINUED] insulin glargine (LANTUS SOLOSTAR) 100 UNIT/ML Solostar Pen Inject 20 Units into the skin daily.   [DISCONTINUED] Insulin Pen Needle (PEN NEEDLES) 31G X 5 MM MISC use as directed in the morning, at noon, in the evening, and at bedtime.   [DISCONTINUED] metFORMIN (GLUCOPHAGE) 500 MG tablet Take 2 tablets (1,000 mg total) by mouth daily with breakfast.   [DISCONTINUED] pantoprazole (PROTONIX) 40 MG tablet Take 1 tablet (40 mg total) by mouth once daily.   [DISCONTINUED] thiamine (VITAMIN B1) 100 MG tablet Take 1 tablet (100 mg total) by mouth once daily.   [DISCONTINUED] valsartan-hydrochlorothiazide (DIOVAN-HCT) 320-25 MG tablet Take 1 tablet by mouth once daily.   No facility-administered encounter medications on file as of 08/01/2023.  Follow-up: Return in about 3 months (around 10/31/2023) for diabetes, htn, primary care follow up.   [name], MD' Burnard Bunting, SFNP, [facility]

## 2023-08-01 ENCOUNTER — Ambulatory Visit: Payer: Medicaid Other | Attending: Critical Care Medicine | Admitting: Critical Care Medicine

## 2023-08-01 ENCOUNTER — Encounter: Payer: Self-pay | Admitting: Critical Care Medicine

## 2023-08-01 VITALS — BP 156/78

## 2023-08-01 DIAGNOSIS — Z720 Tobacco use: Secondary | ICD-10-CM

## 2023-08-01 DIAGNOSIS — Z7984 Long term (current) use of oral hypoglycemic drugs: Secondary | ICD-10-CM

## 2023-08-01 DIAGNOSIS — Z8673 Personal history of transient ischemic attack (TIA), and cerebral infarction without residual deficits: Secondary | ICD-10-CM | POA: Diagnosis not present

## 2023-08-01 DIAGNOSIS — I10 Essential (primary) hypertension: Secondary | ICD-10-CM

## 2023-08-01 DIAGNOSIS — H543 Unqualified visual loss, both eyes: Secondary | ICD-10-CM

## 2023-08-01 DIAGNOSIS — H5440 Blindness, one eye, unspecified eye: Secondary | ICD-10-CM | POA: Diagnosis not present

## 2023-08-01 DIAGNOSIS — F17210 Nicotine dependence, cigarettes, uncomplicated: Secondary | ICD-10-CM

## 2023-08-01 DIAGNOSIS — E1165 Type 2 diabetes mellitus with hyperglycemia: Secondary | ICD-10-CM | POA: Diagnosis not present

## 2023-08-01 DIAGNOSIS — Z1211 Encounter for screening for malignant neoplasm of colon: Secondary | ICD-10-CM

## 2023-08-01 DIAGNOSIS — E1169 Type 2 diabetes mellitus with other specified complication: Secondary | ICD-10-CM

## 2023-08-01 DIAGNOSIS — Z794 Long term (current) use of insulin: Secondary | ICD-10-CM | POA: Diagnosis not present

## 2023-08-01 DIAGNOSIS — K029 Dental caries, unspecified: Secondary | ICD-10-CM

## 2023-08-01 DIAGNOSIS — E119 Type 2 diabetes mellitus without complications: Secondary | ICD-10-CM

## 2023-08-01 DIAGNOSIS — E785 Hyperlipidemia, unspecified: Secondary | ICD-10-CM | POA: Diagnosis not present

## 2023-08-01 LAB — POCT GLYCOSYLATED HEMOGLOBIN (HGB A1C): HbA1c POC (<> result, manual entry): >15 % (ref 4.0–5.6)

## 2023-08-01 LAB — GLUCOSE, POCT (MANUAL RESULT ENTRY): POC Glucose: 306 mg/dL — AB (ref 70–99)

## 2023-08-01 MED ORDER — VALSARTAN-HYDROCHLOROTHIAZIDE 320-25 MG PO TABS: 1.0000 | ORAL_TABLET | Freq: Every day | ORAL | 3 refills | Status: DC

## 2023-08-01 MED ORDER — PANTOPRAZOLE SODIUM 40 MG PO TBEC: 40.0000 mg | DELAYED_RELEASE_TABLET | Freq: Every day | ORAL | 2 refills | Status: DC

## 2023-08-01 MED ORDER — THIAMINE HCL 100 MG PO TABS: 100.0000 mg | ORAL_TABLET | Freq: Every day | ORAL | 0 refills | Status: DC

## 2023-08-01 MED ORDER — TRULICITY 1.5 MG/0.5ML ~~LOC~~ SOAJ: 1.5000 mg | SUBCUTANEOUS | 4 refills | Status: DC

## 2023-08-01 MED ORDER — VITAMIN B-12 1000 MCG PO TABS: 1000.0000 ug | ORAL_TABLET | Freq: Every day | ORAL | 2 refills | Status: DC

## 2023-08-01 MED ORDER — ATORVASTATIN CALCIUM 80 MG PO TABS: 80.0000 mg | ORAL_TABLET | Freq: Every evening | ORAL | 2 refills | Status: DC

## 2023-08-01 MED ORDER — DAPAGLIFLOZIN PROPANEDIOL 10 MG PO TABS: 10.0000 mg | ORAL_TABLET | Freq: Every day | ORAL | 2 refills | Status: DC

## 2023-08-01 MED ORDER — PEN NEEDLES 31G X 5 MM MISC: 4.0000 | Freq: Four times a day (QID) | 1 refills | Status: DC

## 2023-08-01 MED ORDER — VENTOLIN HFA 108 (90 BASE) MCG/ACT IN AERS: 2.0000 | INHALATION_SPRAY | Freq: Four times a day (QID) | RESPIRATORY_TRACT | 0 refills | Status: DC | PRN

## 2023-08-01 MED ORDER — METFORMIN HCL 500 MG PO TABS: 1000.0000 mg | ORAL_TABLET | Freq: Every day | ORAL | 1 refills | Status: DC

## 2023-08-01 MED ORDER — ACCU-CHEK GUIDE VI STRP: ORAL_STRIP | 6 refills | Status: DC

## 2023-08-01 MED ORDER — LANTUS SOLOSTAR 100 UNIT/ML ~~LOC~~ SOPN: 30.0000 [IU] | PEN_INJECTOR | Freq: Every day | SUBCUTANEOUS | 2 refills | Status: DC

## 2023-08-01 MED ORDER — CYCLOBENZAPRINE HCL 10 MG PO TABS: 10.0000 mg | ORAL_TABLET | Freq: Three times a day (TID) | ORAL | 0 refills | Status: DC | PRN

## 2023-08-01 MED ORDER — CARVEDILOL 25 MG PO TABS: 25.0000 mg | ORAL_TABLET | Freq: Two times a day (BID) | ORAL | 3 refills | Status: DC

## 2023-08-01 MED ORDER — ADVAIR DISKUS 100-50 MCG/ACT IN AEPB: 1.0000 | INHALATION_SPRAY | Freq: Two times a day (BID) | RESPIRATORY_TRACT | 2 refills | Status: DC

## 2023-08-01 MED ORDER — ACCU-CHEK SOFTCLIX LANCETS MISC: 6 refills | Status: DC

## 2023-08-01 MED ORDER — AMLODIPINE BESYLATE 10 MG PO TABS: 10.0000 mg | ORAL_TABLET | Freq: Every day | ORAL | 2 refills | Status: DC

## 2023-08-01 NOTE — Assessment & Plan Note (Signed)
A1C > 15, CBG 306. Will order talking CBG meter. Increase Trulicity to 1.5 mg [place] weekly, and Lantus to 30 units daily.

## 2023-08-01 NOTE — Assessment & Plan Note (Signed)
Continue atorvastatin

## 2023-08-01 NOTE — Assessment & Plan Note (Signed)
Marland Kitchen  Current smoking consumption amount:  10 cigarettes a day  . Dicsussion on advise to quit smoking and smoking impacts: Lung cardiovascular impacts  . Patient's willingness to quit: Willing to quit  . Methods to quit smoking discussed: Behavioral modification  . Medication management of smoking session drugs discussed: Nicotine replacement  . Resources provided:  AVS   . Setting quit date not established  . Follow-up arranged 4 months   Time spent counseling the patient: 5 minutes

## 2023-08-01 NOTE — Assessment & Plan Note (Signed)
Poor dentition, dental caries, and missing teeth noted. Referral placed for dental appointment.

## 2023-08-01 NOTE — Patient Instructions (Signed)
All medications were refilled, increase trulicity to 1.5mg  weekly  Increase insulin to 30 units  Labs today  Referrals to Eye, Dental and new primary care

## 2023-08-01 NOTE — Assessment & Plan Note (Signed)
BP 156/78. Patient says missing doses of medications due to vision. Continue previously ordered medications. Will attempt to obtain patient assistance to fill medication box.

## 2023-08-02 ENCOUNTER — Telehealth: Payer: Self-pay

## 2023-08-02 NOTE — Telephone Encounter (Signed)
-----   Message from [name] sent at 08/02/2023 12:25 PM EDT ----- Let patient know kidney function stable liver normal

## 2023-08-02 NOTE — Telephone Encounter (Signed)
Pt was called and vm was left, Information has been sent to nurse pool.

## 2023-08-02 NOTE — Progress Notes (Signed)
Let patient know kidney function stable liver normal

## 2023-08-15 ENCOUNTER — Other Ambulatory Visit: Payer: Self-pay | Admitting: Critical Care Medicine

## 2023-08-18 ENCOUNTER — Other Ambulatory Visit: Payer: Self-pay

## 2023-08-28 MED ORDER — CYCLOBENZAPRINE HCL 10 MG PO TABS: 10.0000 mg | ORAL_TABLET | Freq: Three times a day (TID) | ORAL | 0 refills | Status: DC | PRN

## 2023-10-07 ENCOUNTER — Other Ambulatory Visit: Payer: Self-pay | Admitting: Critical Care Medicine

## 2024-09-21 ENCOUNTER — Encounter (HOSPITAL_COMMUNITY): Payer: Self-pay | Admitting: Pharmacy Technician

## 2024-09-21 ENCOUNTER — Emergency Department (HOSPITAL_COMMUNITY)
Admission: EM | Admit: 2024-09-21 | Discharge: 2024-09-21 | Disposition: A | Discharge status: Home / Self Care | Attending: Emergency Medicine | Admitting: Emergency Medicine

## 2024-09-21 ENCOUNTER — Other Ambulatory Visit: Payer: Self-pay

## 2024-09-21 ENCOUNTER — Emergency Department (HOSPITAL_COMMUNITY)

## 2024-09-21 DIAGNOSIS — Z5900 Homelessness unspecified: Secondary | ICD-10-CM | POA: Diagnosis not present

## 2024-09-21 DIAGNOSIS — Z7982 Long term (current) use of aspirin: Secondary | ICD-10-CM | POA: Insufficient documentation

## 2024-09-21 DIAGNOSIS — Z794 Long term (current) use of insulin: Secondary | ICD-10-CM | POA: Diagnosis not present

## 2024-09-21 DIAGNOSIS — Z8673 Personal history of transient ischemic attack (TIA), and cerebral infarction without residual deficits: Secondary | ICD-10-CM | POA: Insufficient documentation

## 2024-09-21 DIAGNOSIS — E1165 Type 2 diabetes mellitus with hyperglycemia: Secondary | ICD-10-CM | POA: Insufficient documentation

## 2024-09-21 DIAGNOSIS — M5432 Sciatica, left side: Secondary | ICD-10-CM | POA: Insufficient documentation

## 2024-09-21 DIAGNOSIS — I10 Essential (primary) hypertension: Secondary | ICD-10-CM | POA: Insufficient documentation

## 2024-09-21 DIAGNOSIS — J449 Chronic obstructive pulmonary disease, unspecified: Secondary | ICD-10-CM | POA: Insufficient documentation

## 2024-09-21 DIAGNOSIS — Z7984 Long term (current) use of oral hypoglycemic drugs: Secondary | ICD-10-CM | POA: Insufficient documentation

## 2024-09-21 DIAGNOSIS — Z7951 Long term (current) use of inhaled steroids: Secondary | ICD-10-CM | POA: Insufficient documentation

## 2024-09-21 DIAGNOSIS — M545 Low back pain, unspecified: Secondary | ICD-10-CM | POA: Diagnosis present

## 2024-09-21 DIAGNOSIS — Z79899 Other long term (current) drug therapy: Secondary | ICD-10-CM | POA: Insufficient documentation

## 2024-09-21 LAB — CBG MONITORING, ED: Glucose-Capillary: 188 mg/dL — ABNORMAL HIGH (ref 70–99)

## 2024-09-21 MED ORDER — METHOCARBAMOL 500 MG PO TABS
500.0000 mg | ORAL_TABLET | Freq: Two times a day (BID) | ORAL | 0 refills | Status: AC | PRN
Start: 2024-09-21 — End: ?

## 2024-09-21 MED ORDER — AMLODIPINE BESYLATE 5 MG PO TABS
10.0000 mg | ORAL_TABLET | Freq: Once | ORAL | Status: AC
  Administered 2024-09-21: 10 mg via ORAL
  Filled 2024-09-21: qty 2

## 2024-09-21 MED ORDER — GLIPIZIDE ER 5 MG PO TB24
5.0000 mg | ORAL_TABLET | Freq: Every day | ORAL | 0 refills | Status: AC
Start: 2024-09-21 — End: ?

## 2024-09-21 MED ORDER — GLIPIZIDE ER 5 MG PO TB24
5.0000 mg | ORAL_TABLET | Freq: Once | ORAL | Status: AC
  Administered 2024-09-21: 5 mg via ORAL
  Filled 2024-09-21: qty 1

## 2024-09-21 MED ORDER — LIDOCAINE 5 % EX PTCH
1.0000 | MEDICATED_PATCH | CUTANEOUS | Status: DC
  Administered 2024-09-21: 1 via TRANSDERMAL
  Filled 2024-09-21: qty 1

## 2024-09-21 MED ORDER — AMLODIPINE BESYLATE 10 MG PO TABS: 10.0000 mg | ORAL_TABLET | Freq: Every day | ORAL | 0 refills | Status: DC

## 2024-09-21 MED ORDER — HYDROCODONE-ACETAMINOPHEN 5-325 MG PO TABS
1.0000 | ORAL_TABLET | Freq: Once | ORAL | Status: AC
Start: 2024-09-21 — End: 2024-09-21
  Administered 2024-09-21: 1 via ORAL
  Filled 2024-09-21: qty 1

## 2024-09-21 NOTE — ED Provider Triage Note (Signed)
 Emergency Medicine Provider Triage Evaluation Note  Briana Newman , a 58 y.o. male  was evaluated in triage.  Pt complains of back pain. Reports pain started today, is in his low back and radiates into his left leg. Does report some sensation changes in his left leg. No recent trauma, no loss of bowel or bladder function or saddle anesthesia. Does report he broke his back in the 40s and has had some chronic back pain since then, no hx of back surgeries. No fevers or chills. Walked here today  Review of Systems  Positive:  Negative:   Physical Exam  There were no vitals taken for this visit. Gen:   Awake, no distress   Resp:  Normal effort  MSK:   Moves extremities without difficulty  Other:  Ambulatory. L spine ttp  Medical Decision Making  Medically screening exam initiated at 12:18 PM.  Appropriate orders placed.  Jenesis Suchy was informed that the remainder of the evaluation will be completed by another provider, this initial triage assessment does not replace that evaluation, and the importance of remaining in the ED until their evaluation is complete.     Nora Lauraine LABOR, PA-C 09/21/24 1221

## 2024-09-21 NOTE — ED Triage Notes (Signed)
 Denies any bowel or bladder incontinence.

## 2024-09-21 NOTE — ED Notes (Addendum)
 Pt said he has been having problems with his back lately and this morning his left leg felt numb

## 2024-09-21 NOTE — ED Notes (Signed)
 Pt  reported right ankle swollen, nurse notified.

## 2024-09-21 NOTE — ED Provider Notes (Signed)
 [place] EMERGENCY DEPARTMENT AT [facility] Provider Note   CSN: [id number] Arrival date & time: 09/21/24  1206     Patient presents with: Back Pain   Stanley Stone is a 58 y.o. male.   Pt is a 58 yo male with pmhx significant for htn, dm, copd, gerd, hld, cva, and homelessness.  Pt said he's had issues with his back since 1987 when he had an injury.  He said it's been more painful lately.  He's had pain radiating down his left leg.  He did see his pcp on 10/23 and she gave him a rx for flexeril .  He's been living in [place] and I don't think he's gotten it filled.  He's also been out of his diabetes and htn meds for the last week.  He is ambulatory, but it hurts to walk.  No bowel/bladder problems.       Prior to Admission medications   Medication Sig Start Date End Date Taking? Authorizing Provider  glipiZIDE (GLIPIZIDE XL) 5 MG 24 hr tablet Take 1 tablet (5 mg total) by mouth daily with breakfast. 09/21/24  Yes Miquan Tandon, MD  methocarbamol (ROBAXIN) 500 MG tablet Take 1 tablet (500 mg total) by mouth 2 (two) times daily as needed for muscle spasms. 09/21/24  Yes Dean Clarity, MD  Accu-Chek Softclix Lancets lancets Use to check blood sugar three times daily. 08/01/23   Brien Belvie BRAVO, MD  albuterol  (VENTOLIN  HFA) 108 (90 Base) MCG/ACT inhaler TAKE 2 PUFFS BY MOUTH EVERY 6 HOURS AS NEEDED FOR WHEEZE OR SHORTNESS OF BREATH 10/09/23   Brien Belvie BRAVO, MD  amLODipine  (NORVASC ) 10 MG tablet Take 1 tablet (10 mg total) by mouth daily. 09/21/24   Dean Clarity, MD  aspirin  325 MG tablet Take 1 tablet (325 mg total) by mouth daily. 03/29/22   Danton Reyes DASEN, MD  atorvastatin  (LIPITOR) 80 MG tablet Take 1 tablet (80 mg total) by mouth every evening. 08/01/23   Brien Belvie BRAVO, MD  Blood Glucose Monitoring Suppl (ACCU-CHEK GUIDE) w/Device KIT Use to check blood sugar three times daily. 02/28/23   Brien Belvie BRAVO, MD  carvedilol  (COREG ) 25 MG tablet Take 1 tablet (25 mg  total) by mouth 2 (two) times daily with a meal. 08/01/23   Brien Belvie BRAVO, MD  cyanocobalamin  (VITAMIN B12) 1000 MCG tablet Take 1 tablet (1,000 mcg total) by mouth once daily. 08/01/23   Brien Belvie BRAVO, MD  cyclobenzaprine  (FLEXERIL ) 10 MG tablet Take 1 tablet (10 mg total) by mouth 3 (three) times daily as needed for muscle spasms. 08/28/23   Mayers, Cari S, PA-C  dapagliflozin  propanediol (FARXIGA ) 10 MG TABS tablet Take 1 tablet (10 mg total) by mouth daily before breakfast. 08/01/23   Brien Belvie BRAVO, MD  Dulaglutide  (TRULICITY ) 1.5 MG/0.5ML SOPN Inject 1.5 mg into the skin once a week. 08/01/23   Brien Belvie BRAVO, MD  fluticasone -salmeterol (ADVAIR  DISKUS) 100-50 MCG/ACT AEPB Inhale 1 puff into the lungs 2 (two) times daily. 08/01/23   Brien Belvie BRAVO, MD  glucose blood (ACCU-CHEK GUIDE) test strip Use to check blood sugar three times daily. 08/01/23   Brien Belvie BRAVO, MD  insulin  glargine (LANTUS  SOLOSTAR) 100 UNIT/ML Solostar Pen Inject 30 Units into the skin daily. 08/01/23   Brien Belvie BRAVO, MD  Insulin  Pen Needle (PEN NEEDLES) 31G X 5 MM MISC use as directed in the morning, at noon, in the evening, and at bedtime. 08/01/23   Brien Belvie BRAVO, MD  metFORMIN  (GLUCOPHAGE ) 500 MG tablet Take 2 tablets (1,000 mg total) by mouth daily with breakfast. 08/01/23   Brien Belvie BRAVO, MD  pantoprazole  (PROTONIX ) 40 MG tablet Take 1 tablet (40 mg total) by mouth once daily. 08/01/23   Brien Belvie BRAVO, MD  polyethylene glycol powder (GLYCOLAX /MIRALAX ) 17 GM/SCOOP powder Take 17 g by mouth 2 (two) times daily as needed. 03/31/22   Brien Belvie BRAVO, MD  sertraline (ZOLOFT) 50 MG tablet Take 50 mg by mouth daily. 07/24/23   [provider]  thiamine  (VITAMIN B1) 100 MG tablet Take 1 tablet (100 mg total) by mouth once daily. 08/01/23   Brien Belvie BRAVO, MD  valsartan -hydrochlorothiazide  (DIOVAN -HCT) 320-25 MG tablet Take 1 tablet by mouth once daily. 08/01/23   Brien Belvie BRAVO, MD     Allergies: Fish allergy, Penicillins, and Shellfish allergy    Review of Systems  Musculoskeletal:  Positive for back pain.  All other systems reviewed and are negative.   Updated Vital Signs BP (!) 167/93   Pulse 75   Temp 97.9 F (36.6 C)   Resp 18   SpO2 99%   Physical Exam Vitals and nursing note reviewed.  Constitutional:      Appearance: Normal appearance.  HENT:     Head: Normocephalic and atraumatic.     Right Ear: External ear normal.     Left Ear: External ear normal.     Nose: Nose normal.     Mouth/Throat:     Mouth: Mucous membranes are moist.     Pharynx: Oropharynx is clear.  Eyes:     Extraocular Movements: Extraocular movements intact.     Conjunctiva/sclera: Conjunctivae normal.     Pupils: Pupils are equal, round, and reactive to light.  Cardiovascular:     Rate and Rhythm: Normal rate and regular rhythm.     Pulses: Normal pulses.     Heart sounds: Normal heart sounds.  Pulmonary:     Effort: Pulmonary effort is normal.     Breath sounds: Normal breath sounds.  Abdominal:     General: Abdomen is flat. Bowel sounds are normal.     Palpations: Abdomen is soft.  Musculoskeletal:        General: Normal range of motion.     Cervical back: Normal range of motion and neck supple.     Comments: +str leg raise on left  Skin:    General: Skin is warm.     Capillary Refill: Capillary refill takes less than 2 seconds.  Neurological:     General: No focal deficit present.     Mental Status: He is alert and oriented to person, place, and time.  Psychiatric:        Mood and Affect: Mood normal.        Behavior: Behavior normal.     (all labs ordered are listed, but only abnormal results are displayed) Labs Reviewed  CBG MONITORING, ED - Abnormal; Notable for the following components:      Result Value   Glucose-Capillary 188 (*)    All other components within normal limits    EKG: None  Radiology: DG Lumbar Spine Complete Result Date:  09/21/2024 EXAM: 4 VIEW(S) XRAY OF THE LUMBAR SPINE 09/21/2024 01:17:00 PM COMPARISON: CT abdomen and pelvis from 06/26/2024. CLINICAL HISTORY: back pain FINDINGS: LUMBAR SPINE: BONES: The alignment of the lumbar spine appears within normal limits. There is a chronic superior endplate compression deformity involving the L2 vertebra with mild loss of vertebral body  height unchanged from previous exam. No new compression fractures. No aggressive appearing osseous lesion. DISCS AND DEGENERATIVE CHANGES: Mild multilevel endplate degenerative changes. L5-S1 facet arthropathy. SOFT TISSUES: No acute abnormality. IMPRESSION: 1. No acute osseous abnormality identified. Electronically signed by: Waddell Calk MD 09/21/2024 01:48 PM EST RP Workstation: [id number]     Procedures   Medications Ordered in the ED  lidocaine (LIDODERM) 5 % 1 patch (1 patch Transdermal Patch Applied 09/21/24 1324)  glipiZIDE (GLUCOTROL XL) 24 hr tablet 5 mg (5 mg Oral Given 09/21/24 1323)  amLODipine  (NORVASC ) tablet 10 mg (10 mg Oral Given 09/21/24 1322)  HYDROcodone-acetaminophen  (NORCO/VICODIN) 5-325 MG per tablet 1 tablet (1 tablet Oral Given 09/21/24 1322)                                    Medical Decision Making Risk Prescription drug management.   This patient presents to the ED for concern of lbp, this involves an extensive number of treatment options, and is a complaint that carries with it a high risk of complications and morbidity.  The differential diagnosis includes sciatica, aortic dissection,    Co morbidities that complicate the patient evaluation  htn, dm, copd, gerd, hld, cva, and homelessness   Additional history obtained:  Additional history obtained from epic chart review  Lab Tests:  I Ordered, and personally interpreted labs.  The pertinent results include:  cbg 188   Imaging Studies ordered:  I ordered imaging studies including lumbar xr  I independently visualized and interpreted  imaging which showed No acute osseous abnormality identified.  I agree with the radiologist interpretation   Medicines ordered and prescription drug management:  I ordered medication including lortab, lidocaine patch  for sx  Reevaluation of the patient after these medicines showed that the patient improved I have reviewed the patients home medicines and have made adjustments as needed   Test Considered:  Ct/mri   Problem List / ED Course:  Sciatica:  sx c/w sciatica.  Sx have been intermittent for years.  He has poor access to PT as he's homeless.  Pt given rehab exercises to do.  He requested rx to be sent to [facility], even though he's here.  So, it's unclear if he can get them or not.  Steroids avoided due to hyperglycemia and medication noncompliance. HTN/DM:  rx for amlodipine /glipizide sent to preferred pharmacy.  He will need to f/u with pcp.   Reevaluation:  After the interventions noted above, I reevaluated the patient and found that they have :improved   Social Determinants of Health:  homeless   Dispostion:  After consideration of the diagnostic results and the patients response to treatment, I feel that the patent would benefit from discharge with outpatient f/u.       Final diagnoses:  Sciatica of left side  Homeless    ED Discharge Orders          Ordered    amLODipine  (NORVASC ) 10 MG tablet  Daily        09/21/24 1405    glipiZIDE (GLIPIZIDE XL) 5 MG 24 hr tablet  Daily with breakfast        09/21/24 1405    methocarbamol (ROBAXIN) 500 MG tablet  2 times daily PRN        09/21/24 1406               Dean Clarity, MD 09/21/24 1419

## 2024-09-21 NOTE — Discharge Instructions (Addendum)
 Take the Robaxin instead of the Flexeril .

## 2024-09-24 ENCOUNTER — Telehealth: Admitting: Nurse Practitioner

## 2024-09-24 ENCOUNTER — Other Ambulatory Visit: Payer: Self-pay

## 2024-09-24 DIAGNOSIS — Z794 Long term (current) use of insulin: Secondary | ICD-10-CM

## 2024-09-24 DIAGNOSIS — I10 Essential (primary) hypertension: Secondary | ICD-10-CM

## 2024-09-24 DIAGNOSIS — E1165 Type 2 diabetes mellitus with hyperglycemia: Secondary | ICD-10-CM | POA: Diagnosis not present

## 2024-09-24 MED ORDER — LANTUS SOLOSTAR 100 UNIT/ML ~~LOC~~ SOPN
30.0000 [IU] | PEN_INJECTOR | Freq: Every day | SUBCUTANEOUS | 2 refills | Status: DC
  Filled 2024-09-24: qty 15, 50d supply, fill #0

## 2024-09-24 MED ORDER — PEN NEEDLES 31G X 5 MM MISC
4.0000 | Freq: Four times a day (QID) | 1 refills | Status: DC
  Filled 2024-09-24: qty 100, 25d supply, fill #0

## 2024-09-24 MED ORDER — AMLODIPINE BESYLATE 10 MG PO TABS
10.0000 mg | ORAL_TABLET | Freq: Every day | ORAL | 0 refills | Status: DC
  Filled 2024-09-24: qty 30, 30d supply, fill #0

## 2024-09-24 NOTE — Progress Notes (Signed)
 Acute Video Visit    Virtual Visit Consent:   Javaun Dimperio, you are scheduled for a virtual visit with a [facility] provider today.     Just as with appointments in the office, your consent must be obtained to participate.  Your consent will be active for this visit and any virtual visit you may have with one of our providers in the next 365 days.     If you have a MyChart account, a copy of this consent can be sent to you electronically.  All virtual visits are billed to your insurance company just like a traditional visit in the office.    If the connection with a video visit is poor, the visit may have to be switched to a telephone visit.  With either a video or telephone visit, we are not always able to ensure that we have a secure connection.     I need to obtain your verbal consent now.   Are you willing to proceed with your visit today?    Urho Rio has provided verbal consent on 09/24/2024 for a virtual visit (video or telephone).   Lauraine Kitty, FNP  Date: 09/24/2024 11:07 AM  Subjective:     Patient ID: Stanley Stone, male    DOB: [birth year], 58 y.o.   MRN: [id number]  LILLETTE Lauraine Kitty, connected with  Italo Banton  ([id number], 05-06-1966) on 09/24/24 at 12:00 PM EST by a video-enabled telemedicine application and verified that I am speaking with the correct person using two identifiers.   Location: Patient: [facility]  Provider: Virtual Visit Location Provider: Home Office   I discussed the limitations of evaluation and management by telemedicine and the availability of in person appointments. The patient expressed understanding and agreed to proceed.     HPI Stanley Stone is a 58 y.o. who identifies as a male who was assigned male at birth, and is being seen today for request in medication assistance. He has a significant medical history of HTN and DM2  He is out of his Lantus  (30 units daily)  and amlodipine  (daily) and is without current means to pay  a copay   He has been a patient of Dr. Brien in the past but has been without a PCP since Dr Brien retired from [facility].   Interested in continuing care at [facility]   Denies any acute needs today aside from need for refills on medications   No fasting glucose this am is 233      Objective:      Physical Exam Constitutional:      General: He is not in acute distress.    Appearance: Normal appearance.  HENT:     Nose: Nose normal.     Mouth/Throat:     Mouth: Mucous membranes are moist.  Pulmonary:     Effort: Pulmonary effort is normal.  Neurological:     Mental Status: He is alert and oriented to person, place, and time.  Psychiatric:        Mood and Affect: Mood normal.           Assessment & Plan:    Return to [facility] for ongoing needs, may use [facility] while awaiting new PCP to establish in 2 weeks   CCNP may check glucose and BP   1. Type 2 diabetes mellitus with hyperglycemia, with long-term current use of insulin  (HCC) (Primary)  - insulin  glargine (LANTUS  SOLOSTAR) 100 UNIT/ML Solostar Pen; Inject 30 Units into the skin daily.  Dispense: 15 mL; Refill: 2 - Insulin  Pen Needle (PEN NEEDLES) 31G X 5 MM MISC; use as directed in the morning, at noon, in the evening, and at bedtime.  Dispense: 130 each; Refill: 1  2. Essential hypertension  - amLODipine  (NORVASC ) 10 MG tablet; Take 1 tablet (10 mg total) by mouth daily.  Dispense: 30 tablet; Refill: 0     Follow Up Instructions: I discussed the assessment and treatment plan with the patient. The patient was provided an opportunity to ask questions and all were answered. The patient agreed with the plan and demonstrated an understanding of the instructions.  A copy of instructions were sent to the patient via MyChart unless otherwise noted below.    The patient was advised to call back or seek an in-person evaluation if the symptoms worsen or if the condition fails to improve as anticipated.    Lauraine Kitty,  FNP  **Disclaimer: This note may have been dictated with voice recognition software. Similar sounding words can inadvertently be transcribed and this note may contain transcription errors which may not have been corrected upon publication of note.**

## 2024-10-02 ENCOUNTER — Other Ambulatory Visit: Payer: Self-pay

## 2024-10-03 ENCOUNTER — Other Ambulatory Visit: Payer: Self-pay

## 2024-10-24 ENCOUNTER — Encounter (HOSPITAL_COMMUNITY): Payer: Self-pay

## 2024-10-24 ENCOUNTER — Inpatient Hospital Stay (HOSPITAL_COMMUNITY)
Admission: EM | Admit: 2024-10-24 | Discharge: 2024-10-30 | DRG: 305 | Disposition: A | Discharge status: Home / Self Care | Attending: Family Medicine | Admitting: Family Medicine

## 2024-10-24 ENCOUNTER — Other Ambulatory Visit: Payer: Self-pay

## 2024-10-24 ENCOUNTER — Emergency Department (HOSPITAL_COMMUNITY)

## 2024-10-24 DIAGNOSIS — Z880 Allergy status to penicillin: Secondary | ICD-10-CM

## 2024-10-24 DIAGNOSIS — R471 Dysarthria and anarthria: Secondary | ICD-10-CM | POA: Diagnosis present

## 2024-10-24 DIAGNOSIS — I10 Essential (primary) hypertension: Secondary | ICD-10-CM

## 2024-10-24 DIAGNOSIS — Z6830 Body mass index (BMI) 30.0-30.9, adult: Secondary | ICD-10-CM

## 2024-10-24 DIAGNOSIS — R1310 Dysphagia, unspecified: Secondary | ICD-10-CM | POA: Diagnosis present

## 2024-10-24 DIAGNOSIS — J449 Chronic obstructive pulmonary disease, unspecified: Secondary | ICD-10-CM | POA: Diagnosis present

## 2024-10-24 DIAGNOSIS — N179 Acute kidney failure, unspecified: Secondary | ICD-10-CM | POA: Diagnosis present

## 2024-10-24 DIAGNOSIS — F17210 Nicotine dependence, cigarettes, uncomplicated: Secondary | ICD-10-CM | POA: Diagnosis present

## 2024-10-24 DIAGNOSIS — G8194 Hemiplegia, unspecified affecting left nondominant side: Principal | ICD-10-CM

## 2024-10-24 DIAGNOSIS — Z91013 Allergy to seafood: Secondary | ICD-10-CM

## 2024-10-24 DIAGNOSIS — Y903 Blood alcohol level of 60-79 mg/100 ml: Secondary | ICD-10-CM | POA: Diagnosis present

## 2024-10-24 DIAGNOSIS — Z716 Tobacco abuse counseling: Secondary | ICD-10-CM

## 2024-10-24 DIAGNOSIS — R739 Hyperglycemia, unspecified: Secondary | ICD-10-CM

## 2024-10-24 DIAGNOSIS — Z66 Do not resuscitate: Secondary | ICD-10-CM | POA: Diagnosis not present

## 2024-10-24 DIAGNOSIS — I161 Hypertensive emergency: Principal | ICD-10-CM | POA: Diagnosis present

## 2024-10-24 DIAGNOSIS — Z7984 Long term (current) use of oral hypoglycemic drugs: Secondary | ICD-10-CM

## 2024-10-24 DIAGNOSIS — R221 Localized swelling, mass and lump, neck: Secondary | ICD-10-CM | POA: Diagnosis present

## 2024-10-24 DIAGNOSIS — F1090 Alcohol use, unspecified, uncomplicated: Secondary | ICD-10-CM | POA: Diagnosis present

## 2024-10-24 DIAGNOSIS — K219 Gastro-esophageal reflux disease without esophagitis: Secondary | ICD-10-CM | POA: Diagnosis present

## 2024-10-24 DIAGNOSIS — Z91148 Patient's other noncompliance with medication regimen for other reason: Secondary | ICD-10-CM

## 2024-10-24 DIAGNOSIS — Z7985 Long-term (current) use of injectable non-insulin antidiabetic drugs: Secondary | ICD-10-CM

## 2024-10-24 DIAGNOSIS — I69354 Hemiplegia and hemiparesis following cerebral infarction affecting left non-dominant side: Secondary | ICD-10-CM

## 2024-10-24 DIAGNOSIS — F32A Depression, unspecified: Secondary | ICD-10-CM | POA: Diagnosis present

## 2024-10-24 DIAGNOSIS — E876 Hypokalemia: Secondary | ICD-10-CM | POA: Diagnosis present

## 2024-10-24 DIAGNOSIS — Z7982 Long term (current) use of aspirin: Secondary | ICD-10-CM

## 2024-10-24 DIAGNOSIS — E1165 Type 2 diabetes mellitus with hyperglycemia: Secondary | ICD-10-CM | POA: Diagnosis present

## 2024-10-24 DIAGNOSIS — E669 Obesity, unspecified: Secondary | ICD-10-CM | POA: Diagnosis present

## 2024-10-24 DIAGNOSIS — Z79899 Other long term (current) drug therapy: Secondary | ICD-10-CM

## 2024-10-24 DIAGNOSIS — Z7141 Alcohol abuse counseling and surveillance of alcoholic: Secondary | ICD-10-CM

## 2024-10-24 DIAGNOSIS — Z7951 Long term (current) use of inhaled steroids: Secondary | ICD-10-CM

## 2024-10-24 DIAGNOSIS — E7800 Pure hypercholesterolemia, unspecified: Secondary | ICD-10-CM | POA: Diagnosis present

## 2024-10-24 DIAGNOSIS — Z8616 Personal history of COVID-19: Secondary | ICD-10-CM

## 2024-10-24 DIAGNOSIS — H548 Legal blindness, as defined in USA: Secondary | ICD-10-CM | POA: Diagnosis present

## 2024-10-24 DIAGNOSIS — Z5900 Homelessness unspecified: Secondary | ICD-10-CM

## 2024-10-24 DIAGNOSIS — Z794 Long term (current) use of insulin: Secondary | ICD-10-CM

## 2024-10-24 LAB — RAPID URINE DRUG SCREEN, HOSP PERFORMED
Amphetamines: NOT DETECTED
Barbiturates: NOT DETECTED
Benzodiazepines: NOT DETECTED
Cocaine: NOT DETECTED
Opiates: NOT DETECTED
Tetrahydrocannabinol: NOT DETECTED

## 2024-10-24 LAB — CBC WITH DIFFERENTIAL/PLATELET
Abs Immature Granulocytes: 0.04 K/uL (ref 0.00–0.07)
Basophils Absolute: 0 K/uL (ref 0.0–0.1)
Basophils Relative: 0 %
Eosinophils Absolute: 0.1 K/uL (ref 0.0–0.5)
Eosinophils Relative: 1 %
HCT: 46.5 % (ref 39.0–52.0)
Hemoglobin: 15.8 g/dL (ref 13.0–17.0)
Immature Granulocytes: 1 %
Lymphocytes Relative: 26 %
Lymphs Abs: 2.2 K/uL (ref 0.7–4.0)
MCH: 30.4 pg (ref 26.0–34.0)
MCHC: 34 g/dL (ref 30.0–36.0)
MCV: 89.6 fL (ref 80.0–100.0)
Monocytes Absolute: 0.7 K/uL (ref 0.1–1.0)
Monocytes Relative: 8 %
Neutro Abs: 5.4 K/uL (ref 1.7–7.7)
Neutrophils Relative %: 64 %
Platelets: 208 K/uL (ref 150–400)
RBC: 5.19 MIL/uL (ref 4.22–5.81)
RDW: 13 % (ref 11.5–15.5)
WBC: 8.4 K/uL (ref 4.0–10.5)
nRBC: 0 % (ref 0.0–0.2)

## 2024-10-24 LAB — COMPREHENSIVE METABOLIC PANEL WITH GFR
ALT: 23 U/L (ref 0–44)
AST: 17 U/L (ref 15–41)
Albumin: 3.7 g/dL (ref 3.5–5.0)
Alkaline Phosphatase: 82 U/L (ref 38–126)
Anion gap: 16 — ABNORMAL HIGH (ref 5–15)
BUN: 15 mg/dL (ref 6–20)
CO2: 21 mmol/L — ABNORMAL LOW (ref 22–32)
Calcium: 9.3 mg/dL (ref 8.9–10.3)
Chloride: 97 mmol/L — ABNORMAL LOW (ref 98–111)
Creatinine, Ser: 1.14 mg/dL (ref 0.61–1.24)
GFR, Estimated: >60 mL/min (ref >60)
Glucose, Bld: 467 mg/dL — ABNORMAL HIGH (ref 70–99)
Potassium: 3.7 mmol/L (ref 3.5–5.1)
Sodium: 134 mmol/L — ABNORMAL LOW (ref 135–145)
Total Bilirubin: 0.7 mg/dL (ref 0.0–1.2)
Total Protein: 7.2 g/dL (ref 6.5–8.1)

## 2024-10-24 LAB — I-STAT CHEM 8, ED
BUN: 19 mg/dL (ref 6–20)
Calcium, Ion: 1.14 mmol/L — ABNORMAL LOW (ref 1.15–1.40)
Chloride: 98 mmol/L (ref 98–111)
Creatinine, Ser: 1.2 mg/dL (ref 0.61–1.24)
Glucose, Bld: 486 mg/dL — ABNORMAL HIGH (ref 70–99)
HCT: 49 % (ref 39.0–52.0)
Hemoglobin: 16.7 g/dL (ref 13.0–17.0)
Potassium: 3.6 mmol/L (ref 3.5–5.1)
Sodium: 135 mmol/L (ref 135–145)
TCO2: 22 mmol/L (ref 22–32)

## 2024-10-24 LAB — PROTIME-INR
INR: 0.9 (ref 0.8–1.2)
Prothrombin Time: 12.2 s (ref 11.4–15.2)

## 2024-10-24 LAB — ETHANOL: Alcohol, Ethyl (B): 66 mg/dL — ABNORMAL HIGH (ref <15)

## 2024-10-24 LAB — CBG MONITORING, ED: Glucose-Capillary: 467 mg/dL — ABNORMAL HIGH (ref 70–99)

## 2024-10-24 MED ORDER — INSULIN REGULAR(HUMAN) IN NACL 100-0.9 UT/100ML-% IV SOLN: INTRAVENOUS | Status: DC

## 2024-10-24 MED ORDER — IOHEXOL 350 MG/ML SOLN
75.0000 mL | Freq: Once | INTRAVENOUS | Status: AC | PRN
  Administered 2024-10-24: 75 mL via INTRAVENOUS

## 2024-10-24 MED ORDER — LACTATED RINGERS IV BOLUS
20.0000 mL/kg | Freq: Once | INTRAVENOUS | Status: AC
  Administered 2024-10-25: 1640 mL via INTRAVENOUS

## 2024-10-24 MED ORDER — LACTATED RINGERS IV SOLN: INTRAVENOUS | Status: AC

## 2024-10-24 MED ORDER — DEXTROSE 50 % IV SOLN: 0.0000 mL | INTRAVENOUS | Status: DC | PRN

## 2024-10-24 MED ORDER — DEXTROSE IN LACTATED RINGERS 5 % IV SOLN: INTRAVENOUS | Status: DC

## 2024-10-24 NOTE — ED Provider Notes (Signed)
 [place] EMERGENCY DEPARTMENT AT [facility] Provider Note   CSN: [id number] Arrival date & time: 10/24/24  2120     Patient presents with: Numbness   Stanley Stone is a 58 y.o. male.  {Add pertinent medical, surgical, social history, OB history to YEP:67052} The history is provided by the patient, a relative and medical records. No language interpreter was used.     58 year old male with history of prior stroke, diabetes, hypertension, polysubstance use including alcohol use, homelessness, presenting to ED with stroke symptoms history obtained through patient and through daughter who is at bedside.  Patient states for the past 2 days he has had a persistent throbbing headache.  Headache is throughout his entire head.  He also noticed that he is having increasing numbness and weakness to his left side of his body.  He also mentioned difficulty with his speech.  He is legally blind but felt like his vision is even more impaired than before.  He endorsed feeling dizzy and having trouble using his rollator to move about.  He did call his daughter today who brought him here for further assessment.  He has had prior stroke involving the left side of his body and currently not compliant with his medication.  He does admits to alcohol and tobacco use is that he drinks a small amount of beer earlier today.  Last known normal was 2 days ago.  No complaints of chest pain or shortness of breath.  Endorsing mild abdominal discomfort.  Prior to Admission medications  Medication Sig Start Date End Date Taking? Authorizing Provider  Accu-Chek Softclix Lancets lancets Use to check blood sugar three times daily. 08/01/23   Brien Belvie BRAVO, MD  albuterol  (VENTOLIN  HFA) 108 (90 Base) MCG/ACT inhaler TAKE 2 PUFFS BY MOUTH EVERY 6 HOURS AS NEEDED FOR WHEEZE OR SHORTNESS OF BREATH 10/09/23   Brien Belvie BRAVO, MD  amLODipine  (NORVASC ) 10 MG tablet Take 1 tablet (10 mg total) by mouth daily. 09/24/24    Kennyth Domino, FNP  aspirin  325 MG tablet Take 1 tablet (325 mg total) by mouth daily. 03/29/22   Danton Reyes DASEN, MD  atorvastatin  (LIPITOR) 80 MG tablet Take 1 tablet (80 mg total) by mouth every evening. 08/01/23   Brien Belvie BRAVO, MD  Blood Glucose Monitoring Suppl (ACCU-CHEK GUIDE) w/Device KIT Use to check blood sugar three times daily. 02/28/23   Brien Belvie BRAVO, MD  carvedilol  (COREG ) 25 MG tablet Take 1 tablet (25 mg total) by mouth 2 (two) times daily with a meal. 08/01/23   Brien Belvie BRAVO, MD  cyanocobalamin  (VITAMIN B12) 1000 MCG tablet Take 1 tablet (1,000 mcg total) by mouth once daily. 08/01/23   Brien Belvie BRAVO, MD  cyclobenzaprine  (FLEXERIL ) 10 MG tablet Take 1 tablet (10 mg total) by mouth 3 (three) times daily as needed for muscle spasms. 08/28/23   Mayers, Cari S, PA-C  dapagliflozin  propanediol (FARXIGA ) 10 MG TABS tablet Take 1 tablet (10 mg total) by mouth daily before breakfast. 08/01/23   Brien Belvie BRAVO, MD  Dulaglutide  (TRULICITY ) 1.5 MG/0.5ML SOPN Inject 1.5 mg into the skin once a week. 08/01/23   Brien Belvie BRAVO, MD  fluticasone -salmeterol (ADVAIR  DISKUS) 100-50 MCG/ACT AEPB Inhale 1 puff into the lungs 2 (two) times daily. 08/01/23   Brien Belvie BRAVO, MD  glipiZIDE  (GLIPIZIDE  XL) 5 MG 24 hr tablet Take 1 tablet (5 mg total) by mouth daily with breakfast. 09/21/24   Dean Clarity, MD  glucose blood (ACCU-CHEK GUIDE)  test strip Use to check blood sugar three times daily. 08/01/23   Brien Belvie BRAVO, MD  insulin  glargine (LANTUS  SOLOSTAR) 100 UNIT/ML Solostar Pen Inject 30 Units into the skin daily. 09/24/24   Kennyth Domino, FNP  Insulin  Pen Needle (PEN NEEDLES) 31G X 5 MM MISC use as directed in the morning, at noon, in the evening, and at bedtime. 09/24/24   Kennyth Domino, FNP  metFORMIN  (GLUCOPHAGE ) 500 MG tablet Take 2 tablets (1,000 mg total) by mouth daily with breakfast. 08/01/23   Brien Belvie BRAVO, MD  methocarbamol  (ROBAXIN ) 500 MG tablet Take 1 tablet  (500 mg total) by mouth 2 (two) times daily as needed for muscle spasms. 09/21/24   Dean Clarity, MD  pantoprazole  (PROTONIX ) 40 MG tablet Take 1 tablet (40 mg total) by mouth once daily. 08/01/23   Brien Belvie BRAVO, MD  polyethylene glycol powder (GLYCOLAX /MIRALAX ) 17 GM/SCOOP powder Take 17 g by mouth 2 (two) times daily as needed. 03/31/22   Brien Belvie BRAVO, MD  sertraline (ZOLOFT) 50 MG tablet Take 50 mg by mouth daily. 07/24/23   [provider]  thiamine  (VITAMIN B1) 100 MG tablet Take 1 tablet (100 mg total) by mouth once daily. 08/01/23   Brien Belvie BRAVO, MD  valsartan -hydrochlorothiazide  (DIOVAN -HCT) 320-25 MG tablet Take 1 tablet by mouth once daily. 08/01/23   Brien Belvie BRAVO, MD    Allergies: Fish allergy, Penicillins, and Shellfish allergy    Review of Systems  All other systems reviewed and are negative.   Updated Vital Signs BP (!) 161/102 (BP Location: Right Arm)   Pulse 97   Temp 98.7 F (37.1 C)   Resp 16   Ht 5' 5 (1.651 m)   Wt 82 kg   SpO2 96%   BMI 30.08 kg/m   Physical Exam Constitutional:      General: He is not in acute distress.    Appearance: He is well-developed.     Comments: Patient appears older than stated age.  HENT:     Head: Atraumatic.  Eyes:     Conjunctiva/sclera: Conjunctivae normal.  Cardiovascular:     Rate and Rhythm: Normal rate and regular rhythm.     Pulses: Normal pulses.     Heart sounds: Normal heart sounds.  Pulmonary:     Effort: Pulmonary effort is normal.     Breath sounds: Normal breath sounds.  Abdominal:     Tenderness: There is no abdominal tenderness.  Musculoskeletal:     Cervical back: Normal range of motion and neck supple.  Skin:    Findings: No rash.  Neurological:     Mental Status: He is alert.     Comments: Neurologic exam:  Speech delay, pupils equal round reactive to light, extraocular movements intact  Impaired vision 2/2 blindness Cranial nerves III through XII normal including no  facial droop Follows commands, total flacidity and decreased sensation to LUE and LLE Coordination not tested Pronator drift not tested due to LUE weakness Gait not tested      (all labs ordered are listed, but only abnormal results are displayed) Labs Reviewed  CBG MONITORING, ED - Abnormal; Notable for the following components:      Result Value   Glucose-Capillary 467 (*)    All other components within normal limits  I-STAT CHEM 8, ED - Abnormal; Notable for the following components:   Glucose, Bld 486 (*)    Calcium , Ion 1.14 (*)    All other components within normal limits  CBC WITH DIFFERENTIAL/PLATELET  COMPREHENSIVE METABOLIC PANEL WITH GFR  PROTIME-INR  ETHANOL  RAPID URINE DRUG SCREEN, HOSP PERFORMED    EKG: None  Radiology: No results found.  {Document cardiac monitor, telemetry assessment procedure when appropriate:32947} Procedures   Medications Ordered in the ED  iohexol  (OMNIPAQUE ) 350 MG/ML injection 75 mL (75 mLs Intravenous Contrast Given 10/24/24 2217)      {Click here for ABCD2, HEART and other calculators REFRESH Note before signing:1}                              Medical Decision Making  BP (!) 149/96   Pulse 87   Temp 98.7 F (37.1 C)   Resp 17   Ht 5' 5 (1.651 m)   Wt 82 kg   SpO2 91%   BMI 30.08 kg/m   49:64 PM  58 year old male with history of prior stroke, diabetes, hypertension, polysubstance use including alcohol use, homelessness, presenting to ED with stroke symptoms history obtained through patient and through daughter who is at bedside.  Patient states for the past 2 days he has had a persistent throbbing headache.  Headache is throughout his entire head.  He also noticed that he is having increasing numbness and weakness to his left side of his body.  He also mentioned difficulty with his speech.  He is legally blind but felt like his vision is even more impaired than before.  He endorsed feeling dizzy and having trouble using  his rollator to move about.  He did call his daughter today who brought him here for further assessment.  He has had prior stroke involving the left side of his body and currently not compliant with his medication.  He does admits to alcohol and tobacco use is that he drinks a small amount of beer earlier today.  Last known normal was 2 days ago.  No complaints of chest pain or shortness of breath.  Endorsing mild abdominal discomfort.  Exam notable for left-sided weakness involving both left upper and lower extremities.  No facial droop.  Speech is delay and garbled at times.  I appreciate consultation from on-call neurologist Dr. Merrianne who agrees with additional advanced imaging including brain MRI.  I will consult medicine for admission.  -Labs ordered, independently viewed and interpreted by me.  Labs remarkable for CBG 486 -The patient was maintained on a cardiac monitor.  I personally viewed and interpreted the cardiac monitored which showed an underlying rhythm of: *** -Imaging independently viewed and interpreted by me and I agree with radiologist's interpretation.  Result remarkable for *** -This patient presents to the ED for concern of ***, this involves an extensive number of treatment options, and is a complaint that carries with it a high risk of complications and morbidity.  The differential diagnosis includes *** -Co morbidities that complicate the patient evaluation includes *** -Treatment includes *** -Reevaluation of the patient after these medicines showed that the patient {resolved/improved/worsened:23923::improved} -PCP office notes or outside notes reviewed -Discussion with specialist *** -Escalation to admission/observation considered: patients feels much better, is comfortable with discharge, and will follow up with PCP -Prescription medication considered, patient comfortable with *** -Social Determinant of Health considered which includes ***   {Document critical  care time when appropriate  Document review of labs and clinical decision tools ie CHADS2VASC2, etc  Document your independent review of radiology images and any outside records  Document your discussion with family members, caretakers  and with consultants  Document social determinants of health affecting pt's care  Document your decision making why or why not admission, treatments were needed:32947:::1}   Final diagnoses:  None    ED Discharge Orders     None

## 2024-10-24 NOTE — ED Notes (Signed)
 Per Dr. Ula verbal order to by pass labs so pt can receive CTA.

## 2024-10-24 NOTE — ED Notes (Signed)
 Pt in CT- CT notified to take patient to room when test complete

## 2024-10-24 NOTE — Consult Note (Signed)
 NEUROLOGY CONSULT NOTE   Date of service: October 24, 2024 Patient Name: Stanley Stone MRN:  [id number] DOB:  09-28-1966 Chief Complaint: Worsened left sided weakness Requesting Provider: Jerral Meth, MD  History of Present Illness  Stanley Stone is a 58 y.o. homeless male with a PMHx of stroke with residual left sided weakness and bilateral vision loss, diabetes, hypertension, hypercholesterolemia, COPD, AKI (2023), medication noncompliance, EtOH abuse and polysubstance use who presents to the ED via POV with left sided weakness, worsening vision, and slurred speech. LKN was 2 days ago, when his headache and left sided numbness started. These gradually got worse over the next 2 days. The symptoms further worsened this evening at 1900 - the patient stated it got worse and my head started hurting.  He notified his daughter at 38 and asked for a ride to the hospital. Daughter reports that he does not take his home medications. In Triage the patient was hypertensive. He could answer questions but had delayed responses.    Of note, he smelled strongly of EtOH on presentation, but reported drinking only 1-1/2 [employer].   ROS  Comprehensive ROS performed and pertinent positives documented in HPI    Past History   Past Medical History:  Diagnosis Date   AKI (acute kidney injury) 07/20/2022   Closed fracture dislocation of lumbar spine (HCC) 1987   COPD (chronic obstructive pulmonary disease) (HCC)    COVID-19 virus infection 07/20/2022   CVA (cerebral vascular accident) (HCC) 03/25/2022   Diabetes mellitus without complication (HCC)    GERD (gastroesophageal reflux disease)    High cholesterol    Homelessness 02/07/2019   Hypertension     History reviewed. No pertinent surgical history.  Family History: History reviewed. No pertinent family history.  Social History  reports that he has been smoking cigarettes. He has never used smokeless tobacco. He  reports current alcohol use of about 1.0 standard drink of alcohol per week. He reports that he does not currently use drugs.  Allergies[1]  Medications  Current Medications[2]  Vitals   Vitals:   10/24/24 2137 10/24/24 2248 10/24/24 2300 10/24/24 2315  BP:   (!) 148/91 (!) 149/96  Pulse:   84 87  Resp:   17 17  Temp:      TempSrc:      SpO2:  95% 92% 91%  Weight: 82 kg     Height: 5' 5 (1.651 m)       Body mass index is 30.08 kg/m.   Physical Exam   Constitutional: Disheveled appearance. NAD. Psych: Dysthymic affect  Eyes: No scleral injection.  HENT: No OP obstruction.  Head: Normocephalic.  Respiratory: Effort normal, non-labored breathing.  Skin: Sun-damage to skin, chronic.   Neurologic Examination   Mental Status: Alert, oriented to the city, state, month and year, but not the day of the week. Speech is mildly dysarthric but fluent with intact comprehension and naming. Able to follow all commands. Cranial Nerves: II: Severely decreased visual acuity OU, only able to make out colors, gross shapes and movement. In this context there is visual constriction in all 4 quadrants of each eye, with some preservation of central vision. PERRL  III,IV, VI: No ptosis. EOMI. No nystagmus.  V: Temp sensation subjectively equal bilaterally  VII: Grimace is symmetric VIII: Hearing intact to voice IX,X: Prominent hoarseness XI: Head is at the midline XII: Midline tongue extension Motor: RUE and RLE 5/5 LUE with drift and 4-/5 weakness proximally and distally  LLE is  unable to maintain antigravity elevation after being elevated manually by examiner; leg drifts and hits the bed within 2 seconds. There was some force exerted by patient (4/5) with leg in the direction of the bed when asked to keep leg elevated which may be due to difficulty understanding the command vs functionality. Knee extension and APF/ADF were 4/5 on the left, 5/5 on the right.  Sensory: Moderately decreased  fine touch to LUE, severely decreased FT to LLE but with some sensation to pressure. Subjectively normal sensation to RUE and RLE.  Deep Tendon Reflexes: 2+ and symmetric throughout Cerebellar: No ataxia with FNF bilaterally. Slow on the left.  Gait: Deferred  Labs/Imaging/Neurodiagnostic studies   CBC:  Recent Labs  Lab Oct 30, 2024 2148 10-30-24 2158  WBC 8.4  --   NEUTROABS 5.4  --   HGB 15.8 16.7  HCT 46.5 49.0  MCV 89.6  --   PLT 208  --    Basic Metabolic Panel:  Lab Results  Component Value Date   NA 135 10/30/24   K 3.6 30-Oct-2024   CO2 21 (L) 10/30/24   GLUCOSE 486 (H) 2024/10/30   BUN 19 2024-10-30   CREATININE 1.20 October 30, 2024   CALCIUM  9.3 Oct 30, 2024   GFRNONAA >60 30-Oct-2024   GFRAA 82 02/07/2019   Lipid Panel:  Lab Results  Component Value Date   LDLCALC 150 (H) 10/27/2022   HgbA1c:  Lab Results  Component Value Date   HGBA1C >15 08/01/2023   Urine Drug Screen:     Component Value Date/Time   LABOPIA NONE DETECTED 10/30/2024 2120   COCAINSCRNUR NONE DETECTED 30-Oct-2024 2120   LABBENZ NONE DETECTED 30-Oct-2024 2120   AMPHETMU NONE DETECTED 2024/10/30 2120   THCU NONE DETECTED 10-30-2024 2120   LABBARB NONE DETECTED 2024/10/30 2120    Alcohol Level     Component Value Date/Time   ETH 66 (H) October 30, 2024 2148   INR  Lab Results  Component Value Date   INR 0.9 10/30/24   APTT  Lab Results  Component Value Date   APTT 31 09/22/2022   Prior TTE (03/27/22): 1. Left ventricular ejection fraction, by estimation, is 60 to 65%. The  left ventricle has normal function. The left ventricle has no regional  wall motion abnormalities. There is moderate concentric left ventricular  hypertrophy. Left ventricular  diastolic parameters are consistent with Grade I diastolic dysfunction  (impaired relaxation).   2. Right ventricular systolic function is normal. The right ventricular  size is normal. Tricuspid regurgitation signal is inadequate for  assessing  PA pressure.   3. The mitral valve is grossly normal. Trivial mitral valve  regurgitation. No evidence of mitral stenosis.   4. The aortic valve is tricuspid. Aortic valve regurgitation is mild. No  aortic stenosis is present.   5. Aortic dilatation noted. There is mild dilatation of the ascending  aorta, measuring 41 mm.   6. The inferior vena cava is normal in size with greater than 50%  respiratory variability, suggesting right atrial pressure of 3 mmHg.  Conclusion(s)/Recommendation(s): No intracardiac source of embolism  detected on this transthoracic study. Consider a transesophageal  echocardiogram to exclude cardiac source of embolism if clinically  indicated.   ASSESSMENT  58 y.o. homeless male with a PMHx of stroke with residual left sided weakness and bilateral vision loss, diabetes, hypertension, hypercholesterolemia, COPD, AKI (2023), medication noncompliance, EtOH abuse and polysubstance use who presents to the ED via POV with left sided weakness, worsening vision, and slurred speech. LKN was  2 days ago, when his headache and left sided numbness started. These gradually got worse over the next 2 days. The symptoms further worsened this evening at 1900 - the patient stated it got worse and my head started hurting.  He notified his daughter at 45 and asked for a ride to the hospital. Daughter reports that he does not take his home medications. In Triage the patient was hypertensive. He could answer questions but had delayed responses.   - Exam reveals mild dysarthria, severe visual impairment centrally, visual constriction in all 4 quadrants OU, left sided weakness and left hemisensory loss.  - CT head: No acute intracranial abnormality. ASPECTS 10. Similar remote left PCA territory, left basal ganglia, and left frontal cortical infarcts.  - CTA of head and neck: Similar left M1 MCA occlusion with more distal reconstitution since the prior study dated May 25, 2024.  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  - Also seen on CTA is a 2.1 cm suspected right parotid mass with interval increase in abnormally heterogeneous, rounded and now 1.1 cm right level II node. 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 Recommend ENT consultation if not already made. - MRI brain: Per official Radiology report, there is no acute intracranial abnormality; however, on personal review of the DWI images there is a punctate central hyperintensity at the pontomedullary junction with ADC correlate (reviewed with Dr. Voncile). This does not, however appear on the coronal DWI images. Remote left basal ganglia, left frontal and left PCA territory infarcts are also noted.  - Labs: Glucose 231. Na and K normal. Mildly decreased Ca. BUN and Cr normal. LFTs normal. WBC normal. Serum osmolality elevated.  - Impression:  - Equivocal punctate midline stroke at the pontomedullary junction.  - Remote left basal ganglia and left frontal ischemic infarctions, in association with a chronic left M1 occlusion with distal reconstitution.  - Chronic left PCA territory ischemic infarction. Possibly related to this is chronic severe stenosis of the proximal V2 segment of the left vertebral artery.  - Right parotid mass, with associated heterogeneous and rounded level II node, the latter increased in size since the prior imaging study. Involvement of a  RECOMMENDATIONS  1. HgbA1c, fasting lipid panel 2. TTE 3. PT consult, OT consult, Speech consult 4. Continue atorvastatin  5. Add Plavix  to his home ASA regimen 6. Cardiac telemetry  7. Risk factor modification 8. Permissive HTN x 24 hours 9. Frequent neuro checks 10. NPO until passes stroke swallow screen 11. Glycemic control 12. Inpatient ENT consultation for imaging findings concerning for a primary right parotid neoplasm with progression of a nodal metastasis.    ____________________________________________________________________    Bonney SHARK, Genieve Ramaswamy, MD [facility]     [1]  Allergies Allergen Reactions   Fish Allergy Anaphylaxis, Swelling and Other (See Comments)    Throat swells   Penicillins Anaphylaxis, Swelling and Other (See Comments)    Throat swells   Shellfish Allergy Anaphylaxis, Swelling and Other (See Comments)    Throat swells  [2] No current facility-administered medications for this encounter.  Current Outpatient Medications:    Accu-Chek Softclix Lancets lancets, Use to check blood sugar three times daily., Disp: 100 each, Rfl: 6   albuterol  (VENTOLIN  HFA) 108 (90 Base) MCG/ACT inhaler, TAKE 2 PUFFS BY MOUTH EVERY 6 HOURS AS NEEDED FOR WHEEZE OR SHORTNESS OF BREATH, Disp: 18 each, Rfl: 2   amLODipine  (NORVASC ) 10 MG tablet, Take 1 tablet (10 mg total) by  mouth daily., Disp: 30 tablet, Rfl: 0   aspirin  325 MG tablet, Take 1 tablet (325 mg total) by mouth daily., Disp: 30 tablet, Rfl: 2   atorvastatin  (LIPITOR) 80 MG tablet, Take 1 tablet (80 mg total) by mouth every evening., Disp: 90 tablet, Rfl: 2   Blood Glucose Monitoring Suppl (ACCU-CHEK GUIDE) w/Device KIT, Use to check blood sugar three times daily., Disp: 1 kit, Rfl: 0   carvedilol  (COREG ) 25 MG tablet, Take 1 tablet (25 mg total) by mouth 2 (two) times daily with a meal., Disp: 120 tablet, Rfl: 3   cyanocobalamin  (VITAMIN B12) 1000 MCG tablet, Take 1 tablet (1,000 mcg total) by mouth once daily., Disp: 130 tablet, Rfl: 2   cyclobenzaprine  (FLEXERIL ) 10 MG tablet, Take 1 tablet (10 mg total) by mouth 3 (three) times daily as needed for muscle spasms., Disp: 90 tablet, Rfl: 0   dapagliflozin  propanediol (FARXIGA ) 10 MG TABS tablet, Take 1 tablet (10 mg total) by mouth daily before breakfast., Disp: 30 tablet, Rfl: 2   Dulaglutide  (TRULICITY ) 1.5 MG/0.5ML SOPN, Inject 1.5 mg into the skin once a week., Disp: 2 mL, Rfl: 4   fluticasone -salmeterol  (ADVAIR  DISKUS) 100-50 MCG/ACT AEPB, Inhale 1 puff into the lungs 2 (two) times daily., Disp: 60 each, Rfl: 2   glipiZIDE  (GLIPIZIDE  XL) 5 MG 24 hr tablet, Take 1 tablet (5 mg total) by mouth daily with breakfast., Disp: 30 tablet, Rfl: 0   glucose blood (ACCU-CHEK GUIDE) test strip, Use to check blood sugar three times daily., Disp: 100 each, Rfl: 6   insulin  glargine (LANTUS  SOLOSTAR) 100 UNIT/ML Solostar Pen, Inject 30 Units into the skin daily., Disp: 15 mL, Rfl: 2   Insulin  Pen Needle (PEN NEEDLES) 31G X 5 MM MISC, use as directed in the morning, at noon, in the evening, and at bedtime., Disp: 130 each, Rfl: 1   metFORMIN  (GLUCOPHAGE ) 500 MG tablet, Take 2 tablets (1,000 mg total) by mouth daily with breakfast., Disp: 120 tablet, Rfl: 1   methocarbamol  (ROBAXIN ) 500 MG tablet, Take 1 tablet (500 mg total) by mouth 2 (two) times daily as needed for muscle spasms., Disp: 20 tablet, Rfl: 0   pantoprazole  (PROTONIX ) 40 MG tablet, Take 1 tablet (40 mg total) by mouth once daily., Disp: 90 tablet, Rfl: 2   polyethylene glycol powder (GLYCOLAX /MIRALAX ) 17 GM/SCOOP powder, Take 17 g by mouth 2 (two) times daily as needed., Disp: 1530 g, Rfl: 1   sertraline (ZOLOFT) 50 MG tablet, Take 50 mg by mouth daily., Disp: , Rfl:    thiamine  (VITAMIN B1) 100 MG tablet, Take 1 tablet (100 mg total) by mouth once daily., Disp: 100 tablet, Rfl: 0   valsartan -hydrochlorothiazide  (DIOVAN -HCT) 320-25 MG tablet, Take 1 tablet by mouth once daily., Disp: 60 tablet, Rfl: 3

## 2024-10-24 NOTE — ED Triage Notes (Addendum)
 Pt arrived POV for L side weakness, worsening vision, and slurred speech. Pr reports only 1 1/2 miller lights tonight, denies other drug use. LSN is 2 days ago, today at 1900 pt states it got worse and my head started hurting  and notified his daughter at 3 for a ride to the hospital.   Pt has hx of stroke, diabetes, and  high BP; daughter reports pt does not take home meds.

## 2024-10-24 NOTE — ED Notes (Signed)
 Pt smells strongly of ETOH. Reports headache starting 2 days ago worsening. Left sided numbness. Pt hypertensive in triage. Answer questions but delayed.

## 2024-10-25 ENCOUNTER — Observation Stay (HOSPITAL_COMMUNITY)

## 2024-10-25 ENCOUNTER — Emergency Department (HOSPITAL_COMMUNITY)

## 2024-10-25 DIAGNOSIS — I161 Hypertensive emergency: Secondary | ICD-10-CM

## 2024-10-25 LAB — ECHOCARDIOGRAM COMPLETE
Area-P 1/2: 3.45 cm2
Calc EF: 60.1 %
Height: 65 in
S' Lateral: 2.9 cm
Single Plane A2C EF: 56 %
Single Plane A4C EF: 66 %
Weight: 2892.44 [oz_av]

## 2024-10-25 LAB — BASIC METABOLIC PANEL WITH GFR
Anion gap: 13 (ref 5–15)
Anion gap: 8 (ref 5–15)
Anion gap: 9 (ref 5–15)
BUN: 13 mg/dL (ref 6–20)
BUN: 12 mg/dL (ref 6–20)
BUN: 10 mg/dL (ref 6–20)
CO2: 21 mmol/L — ABNORMAL LOW (ref 22–32)
CO2: 27 mmol/L (ref 22–32)
CO2: 26 mmol/L (ref 22–32)
Calcium: 8.6 mg/dL — ABNORMAL LOW (ref 8.9–10.3)
Calcium: 8.7 mg/dL — ABNORMAL LOW (ref 8.9–10.3)
Calcium: 8.4 mg/dL — ABNORMAL LOW (ref 8.9–10.3)
Chloride: 102 mmol/L (ref 98–111)
Chloride: 105 mmol/L (ref 98–111)
Chloride: 101 mmol/L (ref 98–111)
Creatinine, Ser: 0.99 mg/dL (ref 0.61–1.24)
Creatinine, Ser: 1.11 mg/dL (ref 0.61–1.24)
Creatinine, Ser: 1.07 mg/dL (ref 0.61–1.24)
GFR, Estimated: >60 mL/min (ref >60)
GFR, Estimated: >60 mL/min (ref >60)
GFR, Estimated: >60 mL/min (ref >60)
Glucose, Bld: 231 mg/dL — ABNORMAL HIGH (ref 70–99)
Glucose, Bld: 120 mg/dL — ABNORMAL HIGH (ref 70–99)
Glucose, Bld: 251 mg/dL — ABNORMAL HIGH (ref 70–99)
Potassium: 3.6 mmol/L (ref 3.5–5.1)
Potassium: 3.4 mmol/L — ABNORMAL LOW (ref 3.5–5.1)
Potassium: 3.8 mmol/L (ref 3.5–5.1)
Sodium: 136 mmol/L (ref 135–145)
Sodium: 140 mmol/L (ref 135–145)
Sodium: 136 mmol/L (ref 135–145)

## 2024-10-25 LAB — URINALYSIS, ROUTINE W REFLEX MICROSCOPIC
Bacteria, UA: NONE SEEN
Bilirubin Urine: NEGATIVE
Glucose, UA: >=500 mg/dL — AB
Ketones, ur: NEGATIVE mg/dL
Leukocytes,Ua: NEGATIVE
Nitrite: NEGATIVE
Protein, ur: 30 mg/dL — AB
Specific Gravity, Urine: 1.021 (ref 1.005–1.030)
pH: 6 (ref 5.0–8.0)

## 2024-10-25 LAB — LIPID PANEL
Cholesterol: 195 mg/dL (ref 0–200)
HDL: 38 mg/dL — ABNORMAL LOW (ref >40)
LDL Cholesterol: 129 mg/dL — ABNORMAL HIGH (ref 0–99)
Total CHOL/HDL Ratio: 5.1 ratio
Triglycerides: 142 mg/dL (ref <150)
VLDL: 28 mg/dL (ref 0–40)

## 2024-10-25 LAB — CBC
HCT: 43.1 % (ref 39.0–52.0)
Hemoglobin: 14.6 g/dL (ref 13.0–17.0)
MCH: 30.3 pg (ref 26.0–34.0)
MCHC: 33.9 g/dL (ref 30.0–36.0)
MCV: 89.4 fL (ref 80.0–100.0)
Platelets: 176 K/uL (ref 150–400)
RBC: 4.82 MIL/uL (ref 4.22–5.81)
RDW: 13.1 % (ref 11.5–15.5)
WBC: 7.7 K/uL (ref 4.0–10.5)
nRBC: 0 % (ref 0.0–0.2)

## 2024-10-25 LAB — GLUCOSE, CAPILLARY
Glucose-Capillary: 231 mg/dL — ABNORMAL HIGH (ref 70–99)
Glucose-Capillary: 346 mg/dL — ABNORMAL HIGH (ref 70–99)

## 2024-10-25 LAB — HEMOGLOBIN A1C
Hgb A1c MFr Bld: 9.6 % — ABNORMAL HIGH (ref 4.8–5.6)
Mean Plasma Glucose: 228.82 mg/dL

## 2024-10-25 LAB — OSMOLALITY: Osmolality: 298 mosm/kg — ABNORMAL HIGH (ref 275–295)

## 2024-10-25 LAB — CBG MONITORING, ED
Glucose-Capillary: 297 mg/dL — ABNORMAL HIGH (ref 70–99)
Glucose-Capillary: 275 mg/dL — ABNORMAL HIGH (ref 70–99)

## 2024-10-25 MED ORDER — PANTOPRAZOLE SODIUM 40 MG PO TBEC
40.0000 mg | DELAYED_RELEASE_TABLET | Freq: Every day | ORAL | Status: DC
  Administered 2024-10-25 – 2024-10-30 (×6): 40 mg via ORAL
  Filled 2024-10-25 (×6): qty 1

## 2024-10-25 MED ORDER — HYDROCHLOROTHIAZIDE 25 MG PO TABS
25.0000 mg | ORAL_TABLET | Freq: Every day | ORAL | Status: DC
  Administered 2024-10-25 – 2024-10-28 (×4): 25 mg via ORAL
  Filled 2024-10-25 (×4): qty 1

## 2024-10-25 MED ORDER — ONDANSETRON HCL 4 MG PO TABS: 4.0000 mg | ORAL_TABLET | Freq: Four times a day (QID) | ORAL | Status: DC | PRN

## 2024-10-25 MED ORDER — SERTRALINE HCL 50 MG PO TABS
50.0000 mg | ORAL_TABLET | Freq: Every day | ORAL | Status: DC
  Administered 2024-10-25 – 2024-10-30 (×6): 50 mg via ORAL
  Filled 2024-10-25 (×6): qty 1

## 2024-10-25 MED ORDER — VALSARTAN-HYDROCHLOROTHIAZIDE 320-25 MG PO TABS: 1.0000 | ORAL_TABLET | Freq: Every day | ORAL | Status: DC

## 2024-10-25 MED ORDER — AMLODIPINE BESYLATE 5 MG PO TABS
10.0000 mg | ORAL_TABLET | Freq: Every day | ORAL | Status: DC
  Administered 2024-10-25 – 2024-10-30 (×6): 10 mg via ORAL
  Filled 2024-10-25 (×6): qty 2

## 2024-10-25 MED ORDER — ONDANSETRON HCL 4 MG/2ML IJ SOLN: 4.0000 mg | Freq: Four times a day (QID) | INTRAMUSCULAR | Status: DC | PRN

## 2024-10-25 MED ORDER — ACETAMINOPHEN 325 MG PO TABS
650.0000 mg | ORAL_TABLET | Freq: Four times a day (QID) | ORAL | Status: DC | PRN
  Filled 2024-10-25: qty 2

## 2024-10-25 MED ORDER — INSULIN ASPART 100 UNIT/ML IJ SOLN
10.0000 [IU] | Freq: Once | INTRAMUSCULAR | Status: AC
  Administered 2024-10-25: 10 [IU] via SUBCUTANEOUS
  Filled 2024-10-25: qty 10

## 2024-10-25 MED ORDER — ACETAMINOPHEN 650 MG RE SUPP: 650.0000 mg | Freq: Four times a day (QID) | RECTAL | Status: DC | PRN

## 2024-10-25 MED ORDER — IRBESARTAN 300 MG PO TABS
300.0000 mg | ORAL_TABLET | Freq: Every day | ORAL | Status: DC
  Administered 2024-10-25 – 2024-10-28 (×4): 300 mg via ORAL
  Filled 2024-10-25 (×4): qty 1

## 2024-10-25 MED ORDER — CARVEDILOL 12.5 MG PO TABS
25.0000 mg | ORAL_TABLET | Freq: Two times a day (BID) | ORAL | Status: DC
  Administered 2024-10-25 – 2024-10-30 (×11): 25 mg via ORAL
  Filled 2024-10-25 (×11): qty 2

## 2024-10-25 MED ORDER — POTASSIUM CHLORIDE 10 MEQ/100ML IV SOLN
10.0000 meq | Freq: Once | INTRAVENOUS | Status: AC
  Administered 2024-10-25: 10 meq via INTRAVENOUS
  Filled 2024-10-25: qty 100

## 2024-10-25 MED ORDER — PERFLUTREN LIPID MICROSPHERE
1.0000 mL | INTRAVENOUS | Status: AC | PRN
  Administered 2024-10-25: 2 mL via INTRAVENOUS

## 2024-10-25 MED ORDER — ENOXAPARIN SODIUM 40 MG/0.4ML IJ SOSY
40.0000 mg | PREFILLED_SYRINGE | Freq: Every day | INTRAMUSCULAR | Status: DC
  Administered 2024-10-25 – 2024-10-30 (×6): 40 mg via SUBCUTANEOUS
  Filled 2024-10-25 (×6): qty 0.4

## 2024-10-25 MED ORDER — STROKE: EARLY STAGES OF RECOVERY BOOK
Freq: Once | Status: AC
  Filled 2024-10-25: qty 1

## 2024-10-25 MED ORDER — ATORVASTATIN CALCIUM 80 MG PO TABS
80.0000 mg | ORAL_TABLET | Freq: Every evening | ORAL | Status: DC
  Administered 2024-10-25 – 2024-10-29 (×5): 80 mg via ORAL
  Filled 2024-10-25 (×5): qty 1

## 2024-10-25 MED ORDER — ASPIRIN 325 MG PO TABS
325.0000 mg | ORAL_TABLET | Freq: Every day | ORAL | Status: DC
  Administered 2024-10-25 – 2024-10-30 (×6): 325 mg via ORAL
  Filled 2024-10-25 (×6): qty 1

## 2024-10-25 NOTE — Progress Notes (Signed)
 Modified Barium Swallow Study  Patient Details  Name: Stanley Stone MRN: [id number] Date of Birth: 23-Oct-1966  Today's Date: 10/25/2024  Modified Barium Swallow completed.  Full report located under Chart Review in the Imaging Section.  History of Present Illness [name] is a 58 yo male presenting 12/11 with two days of worsening L weakness, worsening vision, and slurred speech. No acute abnormality per radiology report, but per neurology note, there is a punctate central hyperintensity at the pontomedullary junction. 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 Previous MBS in July 2025 revealed oropharyngeal deficits and suspected esophageal component but airway protection was complete. Regular diet and thin liquids were recommended. SLP services throughout that admission otherwise focused on communication/fluency. PMH includes: stroke with residual L weakness and bilateral vision loss, DM, HTN, hypercholesterolemia, COPD, AKI, medication noncompliance, EtOH abuse, polysubstance abuse, unhoused, GERD   Clinical Impression Pt has grossly functional oropharyngeal swallowing, exhibiting aspiration only x1 on initial sip. Suspect some of the coughing during intake at home may be related to more impulsive intake than could be observed during this study. Recommend starting regular solids and thin liquids but reinforced the importance of pacing.  Pt attributes aspiration event to being surprised by the taste, but also denied any sensation of aspiration occurring despite cough response (PAS 7, aspirates not cleared by cough). Throughout the remainder of testing, he had good efficiency and safety with only intermittent, transient penetration of thin liquids, which is considered to be normal (PAS 2). Note that pt had occasional, subtle coughing throughout testing that was not related to airway protection. Question if it could be esophageal, given retention and retrograde flow observed  during brief scan. Attempted to get pt to take larger volumes at a faster rate given report of frequent coughing at home, but he would not do so given aversion to barium; however, he acknowledges that coughing spells at home are often when he is drinking a lot very quickly.    Factors that may increase risk of adverse event in presence of aspiration Noe & Lianne 2021): Poor general health and/or compromised immunity;Weak cough  Swallow Evaluation Recommendations Recommendations: PO diet PO Diet Recommendation: Regular;Thin liquids (Level 0) Liquid Administration via: Cup;Straw Medication Administration: Whole meds with liquid Supervision: Patient able to self-feed;Set-up assistance for safety Swallowing strategies  : Slow rate;Small bites/sips Postural changes: Position pt fully upright for meals;Stay upright 30-60 min after meals Oral care recommendations: Oral care BID (2x/day) Recommended consults: Consider ENT consultation      Leita SAILOR., M.A. CCC-SLP Acute Rehabilitation Services Office: [phone]  Secure chat preferred  10/25/2024,3:54 PM

## 2024-10-25 NOTE — H&P (Signed)
 History and Physical    Stanley Stone FMW:[id number] DOB: 16-Nov-1965 DOA: 10/24/2024  PCP: [facility]   Chief Complaint: Left-sided weakness vision changes and slurred speech  HPI: Stanley Stone is a 58 y.o. male with medical history significant of prior CVA, COPD, diabetes, hypertension, hyperlipidemia who presented emergency department due to slurred speech, vision changes and left-sided weakness.  Patient has been having ongoing neurologic changes for the last 2 days.  He states that this evening got worse and he started develop a headache.  He called his family who brought him to the hospital.  He has a history of prior stroke and has had similar symptoms on prior presentation.  He presents the ER was found to be afebrile and hemodynamically stable.  Labs were obtained on presentation which showed glucose 297, urinalysis negative for infection, BMP unrevealing.  Patient had CT head which showed no acute findings.  CTA head and neck showed carotid mass and severe stenosis of vertebral artery.  MRI brain showed no acute infarcts.  Neurology was consulted and patient was admitted further workup.  On admission patient had notable slurred speech and left-sided weakness.   Review of Systems: Review of Systems  Constitutional:  Negative for chills and fever.  HENT: Negative.    Eyes: Negative.   Respiratory: Negative.    Cardiovascular: Negative.   Gastrointestinal: Negative.   Genitourinary: Negative.   Musculoskeletal: Negative.   Skin: Negative.   Neurological:  Positive for dizziness, focal weakness and weakness.  Psychiatric/Behavioral: Negative.    All other systems reviewed and are negative.    As per HPI otherwise 10 point review of systems negative.   Allergies[1]  Past Medical History:  Diagnosis Date   AKI (acute kidney injury) 07/20/2022   Closed fracture dislocation of lumbar spine (HCC) 1987   COPD (chronic obstructive pulmonary  disease) (HCC)    COVID-19 virus infection 07/20/2022   CVA (cerebral vascular accident) (HCC) 03/25/2022   Diabetes mellitus without complication (HCC)    GERD (gastroesophageal reflux disease)    High cholesterol    Homelessness 02/07/2019   Hypertension     History reviewed. No pertinent surgical history.   reports that he has been smoking cigarettes. He has never used smokeless tobacco. He reports current alcohol use of about 1.0 standard drink of alcohol per week. He reports that he does not currently use drugs.  History reviewed. No pertinent family history.  Prior to Admission medications  Medication Sig Start Date End Date Taking? Authorizing Provider  Accu-Chek Softclix Lancets lancets Use to check blood sugar three times daily. 08/01/23   Brien Belvie BRAVO, MD  albuterol  (VENTOLIN  HFA) 108 (90 Base) MCG/ACT inhaler TAKE 2 PUFFS BY MOUTH EVERY 6 HOURS AS NEEDED FOR WHEEZE OR SHORTNESS OF BREATH 10/09/23   Brien Belvie BRAVO, MD  amLODipine  (NORVASC ) 10 MG tablet Take 1 tablet (10 mg total) by mouth daily. 09/24/24   Kennyth Domino, FNP  aspirin  325 MG tablet Take 1 tablet (325 mg total) by mouth daily. 03/29/22   Danton Reyes DASEN, MD  atorvastatin  (LIPITOR) 80 MG tablet Take 1 tablet (80 mg total) by mouth every evening. 08/01/23   Brien Belvie BRAVO, MD  Blood Glucose Monitoring Suppl (ACCU-CHEK GUIDE) w/Device KIT Use to check blood sugar three times daily. 02/28/23   Brien Belvie BRAVO, MD  carvedilol  (COREG ) 25 MG tablet Take 1 tablet (25 mg total) by mouth 2 (two) times daily with a meal. 08/01/23   Brien,  Belvie BRAVO, MD  cyanocobalamin  (VITAMIN B12) 1000 MCG tablet Take 1 tablet (1,000 mcg total) by mouth once daily. 08/01/23   Brien Belvie BRAVO, MD  cyclobenzaprine  (FLEXERIL ) 10 MG tablet Take 1 tablet (10 mg total) by mouth 3 (three) times daily as needed for muscle spasms. 08/28/23   Mayers, Cari S, PA-C  dapagliflozin  propanediol (FARXIGA ) 10 MG TABS tablet Take 1 tablet (10 mg  total) by mouth daily before breakfast. 08/01/23   Brien Belvie BRAVO, MD  Dulaglutide  (TRULICITY ) 1.5 MG/0.5ML SOPN Inject 1.5 mg into the skin once a week. 08/01/23   Brien Belvie BRAVO, MD  fluticasone -salmeterol (ADVAIR  DISKUS) 100-50 MCG/ACT AEPB Inhale 1 puff into the lungs 2 (two) times daily. 08/01/23   Brien Belvie BRAVO, MD  glipiZIDE  (GLIPIZIDE  XL) 5 MG 24 hr tablet Take 1 tablet (5 mg total) by mouth daily with breakfast. 09/21/24   Dean Clarity, MD  glucose blood (ACCU-CHEK GUIDE) test strip Use to check blood sugar three times daily. 08/01/23   Brien Belvie BRAVO, MD  insulin  glargine (LANTUS  SOLOSTAR) 100 UNIT/ML Solostar Pen Inject 30 Units into the skin daily. 09/24/24   Kennyth Domino, FNP  Insulin  Pen Needle (PEN NEEDLES) 31G X 5 MM MISC use as directed in the morning, at noon, in the evening, and at bedtime. 09/24/24   Kennyth Domino, FNP  metFORMIN  (GLUCOPHAGE ) 500 MG tablet Take 2 tablets (1,000 mg total) by mouth daily with breakfast. 08/01/23   Brien Belvie BRAVO, MD  methocarbamol  (ROBAXIN ) 500 MG tablet Take 1 tablet (500 mg total) by mouth 2 (two) times daily as needed for muscle spasms. 09/21/24   Dean Clarity, MD  pantoprazole  (PROTONIX ) 40 MG tablet Take 1 tablet (40 mg total) by mouth once daily. 08/01/23   Brien Belvie BRAVO, MD  polyethylene glycol powder (GLYCOLAX /MIRALAX ) 17 GM/SCOOP powder Take 17 g by mouth 2 (two) times daily as needed. 03/31/22   Brien Belvie BRAVO, MD  sertraline (ZOLOFT) 50 MG tablet Take 50 mg by mouth daily. 07/24/23   [provider]  thiamine  (VITAMIN B1) 100 MG tablet Take 1 tablet (100 mg total) by mouth once daily. 08/01/23   Brien Belvie BRAVO, MD  valsartan -hydrochlorothiazide  (DIOVAN -HCT) 320-25 MG tablet Take 1 tablet by mouth once daily. 08/01/23   Brien Belvie BRAVO, MD    Physical Exam: Vitals:   10/24/24 2300 10/24/24 2315 10/25/24 0200 10/25/24 0301  BP: (!) 148/91 (!) 149/96 (!) 165/98   Pulse: 84 87 79   Resp: 17 17 10    Temp:     98.1 F (36.7 C)  TempSrc:    Oral  SpO2: 92% 91% 96%   Weight:      Height:       Physical Exam Constitutional:      Appearance: He is normal weight.  HENT:     Head: Normocephalic.     Nose: Nose normal.     Mouth/Throat:     Mouth: Mucous membranes are moist.     Pharynx: Oropharynx is clear.  Eyes:     Conjunctiva/sclera: Conjunctivae normal.     Pupils: Pupils are equal, round, and reactive to light.  Cardiovascular:     Rate and Rhythm: Normal rate and regular rhythm.     Pulses: Normal pulses.     Heart sounds: Normal heart sounds.  Pulmonary:     Effort: Pulmonary effort is normal.     Breath sounds: Normal breath sounds.  Abdominal:     General: Abdomen is flat.  Bowel sounds are normal.  Musculoskeletal:        General: Normal range of motion.     Cervical back: Normal range of motion.  Skin:    General: Skin is warm.  Neurological:     Mental Status: He is alert.     Motor: Weakness present.        Labs on Admission: I have personally reviewed the patients's labs and imaging studies.  Assessment/Plan Principal Problem:   TIA (transient ischemic attack)   # Slurred speech and weakness most likely secondary to recrudescence of prior stroke # Transischemic attack - MRI negative for acute stroke - Neurology consulted - Stenosis of vertebral arteries Plan: Appreciate neurology recommendations Continue statin Continue aspirin   # Parotid mass-encouraged outpatient follow-up  # Hypertension-continue amlodipine , carvedilol , irbesartan, hydrochlorothiazide   # GERD-continue Protonix   # Depression-continue Zoloft    Admission status: Observation Telemetry  Certification: The appropriate patient status for this patient is OBSERVATION. Observation status is judged to be reasonable and necessary in order to provide the required intensity of service to ensure the patient's safety. The patient's presenting symptoms, physical exam findings, and initial  radiographic and laboratory data in the context of their medical condition is felt to place them at decreased risk for further clinical deterioration. Furthermore, it is anticipated that the patient will be medically stable for discharge from the hospital within 2 midnights of admission.     Lamar Dess MD [facility] If 7PM-7AM, please contact night-coverage [email or web address]  10/25/2024, 3:54 AM        [1]  Allergies Allergen Reactions   Fish Allergy Anaphylaxis, Swelling and Other (See Comments)    Throat swells   Penicillins Anaphylaxis, Swelling and Other (See Comments)    Throat swells   Shellfish Allergy Anaphylaxis, Swelling and Other (See Comments)    Throat swells

## 2024-10-25 NOTE — ED Notes (Signed)
 Called and made floor (Tiffany) aware that PT is on the way up.

## 2024-10-25 NOTE — Evaluation (Signed)
 Physical Therapy Evaluation Patient Details Name: Stanley Stone MRN: [id number] DOB: 1966-07-31 Today's Date: 10/25/2024  History of Present Illness  Pt is a 58 year old man who presented on 10/24/24 with headache, worsening vision, slurred speech and L sided weakness. Per neurology note, MRI+ for punctual midline stroke at pontomedullary junction and R parotid mass. PMH: legally blind, alcohol abuse, medical noncompliance, CVA, DM, HLD, COPD, depression, homelessness.  Clinical Impression  Pt is currently presenting at Min A for bed mobility, 2 person Min to Mod A for transfers and unable to progress gait due to tremors noted in standing on second attempt. Pt currently has little to no assistance at home. Due to pt current functional status, home set up and available assistance at home recommending skilled physical therapy services < 3 hours/day in order to address strength, balance and functional mobility to decrease risk for falls, injury, immobility, skin break down and re-hospitalization.        If plan is discharge home, recommend the following: Assistance with cooking/housework;Other (comment) (as needed assistance.)   Can travel by private vehicle   No    Equipment Recommendations Wheelchair (measurements PT);Wheelchair cushion (measurements PT);Hospital bed;Hoyer lift     Functional Status Assessment Patient has had a recent decline in their functional status and demonstrates the ability to make significant improvements in function in a reasonable and predictable amount of time.     Precautions / Restrictions Precautions Precautions: Fall Recall of Precautions/Restrictions: Impaired Restrictions Weight Bearing Restrictions Per Provider Order: No      Mobility  Bed Mobility Overal bed mobility: Needs Assistance Bed Mobility: Supine to Sit, Sit to Supine     Supine to sit: Min assist Sit to supine: Min assist   General bed mobility comments: self assists L LE over  edge of stretcher with L hand holding pants leg, assist to raise trunk, assist for LEs back into bed    Transfers Overall transfer level: Needs assistance Equipment used: Rolling walker (2 wheels) Transfers: Sit to/from Stand, Bed to chair/wheelchair/BSC Sit to Stand: +2 physical assistance, Min assist, Mod assist   Step pivot transfers: +2 physical assistance, Min assist, Mod assist       General transfer comment: +2 mod stretcher to chair without AD to steady, +2 min to rise and steady with RW from chair to bed, cues for hand placement, pt tremulous with knees buckling in static standing    Ambulation/Gait     General Gait Details: unable at this time.     Balance Overall balance assessment: Needs assistance   Sitting balance-Leahy Scale: Fair     Standing balance support: Bilateral upper extremity supported, Reliant on assistive device for balance Standing balance-Leahy Scale: Poor Standing balance comment: and external support       Pertinent Vitals/Pain Pain Assessment Pain Assessment: Faces Faces Pain Scale: No hurt Pain Location: head, back Pain Descriptors / Indicators: Aching Pain Intervention(s): Monitored during session, Repositioned    Home Living Family/patient expects to be discharged to:: Unsure     Additional Comments: pt has been staying in a motel    Prior Function Prior Level of Function : Independent/Modified Independent     Mobility Comments: walks with a rollator, takes the bus ADLs Comments: goes to the shelter to eat, independent in self care     Extremity/Trunk Assessment   Upper Extremity Assessment Upper Extremity Assessment: Defer to OT evaluation LUE Deficits / Details: inconsistent performance during MMT vs functional use, 2/5 FF, 3/5 elbow and  forearm, 3/5 gross grasp, ataxic quality to movement with MMT, later observed to self assist L LE into bed with L UE and place behind him to help him scoot LUE Sensation: decreased light  touch (slowed response during proprioception testing, but correct responses) LUE Coordination: decreased fine motor;decreased gross motor    Lower Extremity Assessment Lower Extremity Assessment: LLE deficits/detail LLE Deficits / Details: inconsistent with testing vs. functional activities. Pt with 0/5 strength with MMT in L knee extension. Pt was able to take steps from chair to stretcher clearing floor with LLE    Cervical / Trunk Assessment Cervical / Trunk Assessment: Normal  Communication   Communication Communication: Impaired Factors Affecting Communication: Reduced clarity of speech    Cognition Arousal: Alert Behavior During Therapy: Flat affect   PT - Cognitive impairments: No family/caregiver present to determine baseline     Following commands: Impaired Following commands impaired: Follows one step commands with increased time     Cueing Cueing Techniques: Verbal cues            Assessment/Plan    PT Assessment Patient needs continued PT services  PT Problem List Decreased strength;Decreased balance;Decreased mobility;Decreased safety awareness;Impaired tone       PT Treatment Interventions DME instruction;Therapeutic activities;Gait training;Therapeutic exercise;Stair training;Balance training;Functional mobility training;Neuromuscular re-education;Patient/family education;Wheelchair mobility training    PT Goals (Current goals can be found in the Care Plan section)  Acute Rehab PT Goals Patient Stated Goal: to improve mobility PT Goal Formulation: With patient Time For Goal Achievement: 11/08/24 Potential to Achieve Goals: Good Additional Goals Additional Goal #1: pt will be able to wheel himself 50 ft with manual w/c at Min A in order to increase independence.    Frequency Min 2X/week     Co-evaluation PT/OT/SLP Co-Evaluation/Treatment: Yes Reason for Co-Treatment: For patient/therapist safety PT goals addressed during session: Mobility/safety  with mobility;Balance;Proper use of DME OT goals addressed during session: ADL's and self-care       AM-PAC PT 6 Clicks Mobility  Outcome Measure Help needed turning from your back to your side while in a flat bed without using bedrails?: A Lot Help needed moving from lying on your back to sitting on the side of a flat bed without using bedrails?: A Lot Help needed moving to and from a bed to a chair (including a wheelchair)?: Total Help needed standing up from a chair using your arms (e.g., wheelchair or bedside chair)?: Total Help needed to walk in hospital room?: Total Help needed climbing 3-5 steps with a railing? : Total 6 Click Score: 8    End of Session Equipment Utilized During Treatment: Gait belt Activity Tolerance: Patient tolerated treatment well Patient left: in bed;with call bell/phone within reach Nurse Communication: Mobility status PT Visit Diagnosis: Unsteadiness on feet (R26.81);Other abnormalities of gait and mobility (R26.89);Muscle weakness (generalized) (M62.81)    Time: 8742-8680 PT Time Calculation (min) (ACUTE ONLY): 22 min   Charges:   PT Evaluation $PT Eval Low Complexity: 1 Low   PT General Charges $$ ACUTE PT VISIT: 1 Visit         Stanley Stone, DPT, CLT  Acute Rehabilitation Services Office: [phone] (Secure chat preferred)   Stanley Stone 10/25/2024, 4:23 PM

## 2024-10-25 NOTE — Progress Notes (Signed)
°  Echocardiogram 2D Echocardiogram has been performed.  Tinnie FORBES Gosling RDCS 10/25/2024, 11:12 AM

## 2024-10-25 NOTE — ED Notes (Signed)
 Patient c/o headache of a 3. He reported that he did not want tylenol .

## 2024-10-25 NOTE — Progress Notes (Deleted)
 PT Cancellation Note  Patient Details Name: Stanley Stone MRN: [id number] DOB: [birth year]   Cancelled Treatment:    Reason Eval/Treat Not Completed: OT screened, no needs identified, will sign off  Dorothyann Maier, DPT, CLT  Acute Rehabilitation Services Office: [phone] (Secure chat preferred)  Dorothyann VEAR Maier 10/25/2024, 10:21 AM

## 2024-10-25 NOTE — Inpatient Diabetes Management (Addendum)
 Inpatient Diabetes Program Recommendations  AACE/ADA: New Consensus Statement on Inpatient Glycemic Control (2015)  Target Ranges:  Prepandial:   less than 140 mg/dL      Peak postprandial:   less than 180 mg/dL (1-2 hours)      Critically ill patients:  140 - 180 mg/dL   Lab Results  Component Value Date   GLUCAP 275 (H) 10/25/2024   HGBA1C 9.6 (H) 10/25/2024    Review of Glycemic Control  Latest Reference Range & Units 10/24/24 21:49 10/25/24 01:12 10/25/24 02:36  Glucose-Capillary 70 - 99 mg/dL 532 (H) 702 (H) 724 (H)   Diabetes history: DM 2 Outpatient Diabetes medications:  Farxiga  10 mg daily Trulicity  1.5 mg weekly Glipizide  XL 5 mg daily Lantus  30 units daily Metformin  1000 mg bid Current orders for Inpatient glycemic control:  None Inpatient Diabetes Program Recommendations:    Please consider adding Novolog  0-9 units tid with meals and HS.  Also please add Lantus  20 units daily (home dose of Lantus  was 30 units daily).   Addendum 1400- briefly spoke to patient at bedside.  He admits to not taking insulin  due to difficulties seeing insulin  pen.  Briefly took out practice pen and showed patient the numbers.  He states he can see them when very close to face.  He would likely benefit from magnifying glass and possibly occupational therapy to help with helping with ADL's.  He states that he thinks that he has more insulin  at his daughters house.  I asked if possibly daughter can help him with injections and he said he would talk to her.  He does not report any other needs at this time. Will follow.  Thanks,  Randall Bullocks, RN, BC-ADM Inpatient Diabetes Coordinator Pager [phone]  (8a-5p)

## 2024-10-25 NOTE — Evaluation (Signed)
 Clinical/Bedside Swallow Evaluation Patient Details  Name: Stanley Stone MRN: [id number] Date of Birth: 1966/02/11  Today's Date: 10/25/2024 Time: SLP Start Time (ACUTE ONLY): 1058 SLP Stop Time (ACUTE ONLY): 1106 SLP Time Calculation (min) (ACUTE ONLY): 8 min  Past Medical History:  Past Medical History:  Diagnosis Date   AKI (acute kidney injury) 07/20/2022   Closed fracture dislocation of lumbar spine (HCC) 1987   COPD (chronic obstructive pulmonary disease) (HCC)    COVID-19 virus infection 07/20/2022   CVA (cerebral vascular accident) (HCC) 03/25/2022   Diabetes mellitus without complication (HCC)    GERD (gastroesophageal reflux disease)    High cholesterol    Homelessness 02/07/2019   Hypertension    Past Surgical History: History reviewed. No pertinent surgical history. HPI:  Mr. Stanley Stone is a 58 yo male presenting 12/11 with two days of worsening L weakness, worsening vision, and slurred speech. No acute abnormality per radiology report, but per neurology note, there is a punctate central hyperintensity at the pontomedullary junction. 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 Previous MBS in July 2025 revealed oropharyngeal deficits and suspected esophageal component but airway protection was complete. Regular diet and thin liquids were recommended. SLP services throughout that admission otherwise focused on communication/fluency. PMH includes: stroke with residual L weakness and bilateral vision loss, DM, HTN, hypercholesterolemia, COPD, AKI, medication noncompliance, EtOH abuse, polysubstance abuse, unhoused, GERD    Assessment / Plan / Recommendation  Clinical Impression  Pt has been experiencing changes to his voice and swallowing over the past month, reporting a further decline over the last few days leading up to admission. Intermittent coughing is concerning for reduced airway protection. Recommend proceeding with MBS to better evaluate oropharyngeal  swallow prior to initiating PO diet. Pending completion, could offer meds in puree as well as small amounts of ice or water after oral care.   Pt says that he has been coughing a lot at home while trying to consume solids or liquids. He believes that the onset of this initially was about one month prior, which is also when he first noticed his dysphonia as well as the lump on the side of his neck. There is potential for ongoing dysphagia related to this mass, but he also thinks that it has gotten worse over the last few days. No overt asymmetry is noted, but pt has reduced ROM bilaterally when asked to move his mouth/lips. Coughing was noted only intermittently with SLP, but he says it has been occurring with more frequency at home. Eval was kept brief as pt needed to be moved to a different room, but further evaluation of swallowing is indicated anyway.   SLP Visit Diagnosis: Dysphagia, unspecified (R13.10)      Swallow Evaluation Recommendations Recommendations: NPO except meds;Free water protocol after oral care Medication Administration: Whole meds with puree Oral care recommendations: Oral care QID (4x/day)     Swallow Study   General HPI: Mr. Stanley Stone is a 59 yo male presenting 12/11 with two days of worsening L weakness, worsening vision, and slurred speech. No acute abnormality per radiology report, but per neurology note, there is a punctate central hyperintensity at the pontomedullary junction. 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 Previous MBS in July 2025 revealed oropharyngeal deficits and suspected esophageal component but airway protection was complete. Regular diet and thin liquids were recommended. SLP services throughout that admission otherwise focused on communication/fluency. PMH includes: stroke with residual L weakness and bilateral vision loss, DM,  HTN, hypercholesterolemia, COPD, AKI, medication noncompliance, EtOH abuse, polysubstance abuse, unhoused,  GERD Type of Study: Bedside Swallow Evaluation Previous Swallow Assessment: see HPI Diet Prior to this Study: NPO Temperature Spikes Noted: No Respiratory Status: Room air History of Recent Intubation: No Behavior/Cognition: Alert;Cooperative;Pleasant mood Oral Cavity Assessment: Within Functional Limits Oral Care Completed by SLP: No Oral Cavity - Dentition: Adequate natural dentition;Missing dentition Vision:  (impaired, but engaging in self-feeding) Self-Feeding Abilities: Able to feed self Patient Positioning: Upright in bed Baseline Vocal Quality: Hoarse Volitional Cough: Weak Volitional Swallow: Able to elicit    Oral/Motor/Sensory Function Overall Oral Motor/Sensory Function:  (reduced ROM bilaterally when asked to move lips)   Ice Chips Ice chips: Within functional limits Presentation: Spoon   Thin Liquid Thin Liquid: Impaired Presentation: Cup;Self Fed;Straw Pharyngeal  Phase Impairments: Cough - Immediate    Nectar Thick Nectar Thick Liquid: Not tested   Honey Thick Honey Thick Liquid: Not tested   Puree Puree: Within functional limits Presentation: Spoon   Solid     Solid: Not tested      Stanley Stone., M.A. CCC-SLP Acute Rehabilitation Services Office: [phone]  Secure chat preferred  10/25/2024,12:22 PM

## 2024-10-25 NOTE — ED Notes (Signed)
 Per lab, patient refused morning labs to be drawn. MD was at bedside so he is aware.

## 2024-10-25 NOTE — ED Notes (Signed)
 Pt.being transported to [facility]

## 2024-10-25 NOTE — Evaluation (Signed)
 Occupational Therapy Evaluation Patient Details Name: Stanley Stone MRN: [id number] DOB: [birth year] Today's Date: 10/25/2024   History of Present Illness   Pt is a 58 year old man who presented on 10/24/24 with headache, worsening vision, slurred speech and L sided weakness. Per neurology note, MRI+ for punctual midline stroke at pontomedullary junction and R parotid mass. PMH: legally blind, alcohol abuse, medical noncompliance, CVA, DM, HLD, COPD, depression, homelessness.     Clinical Impressions Pt walks with a rollator at baseline. He is able to complete ADLs independently and reports taking the bus to a shelter for meals. Pt reports struggling to care for himself due to his low vision prior to admission. He was living in a motel. Pt reports increased L side weakness (has weakness from prior CVA). Pt with inconsistencies in L UE strength and ataxic-like movements during MMT vs functional observation. Pt needs +2 min to mod assist for transfers and min to total assist for ADLs. Patient will benefit from continued inpatient follow up therapy, <3 hours/day.     If plan is discharge home, recommend the following:   Two people to help with walking and/or transfers;A lot of help with bathing/dressing/bathroom;Assistance with cooking/housework;Direct supervision/assist for medications management;Direct supervision/assist for financial management;Assist for transportation;Help with stairs or ramp for entrance     Functional Status Assessment   Patient has had a recent decline in their functional status and demonstrates the ability to make significant improvements in function in a reasonable and predictable amount of time.     Equipment Recommendations   Other (comment) (defer)     Recommendations for Other Services         Precautions/Restrictions   Precautions Precautions: Fall     Mobility Bed Mobility Overal bed mobility: Needs Assistance Bed Mobility: Supine to  Sit, Sit to Supine     Supine to sit: Min assist Sit to supine: Min assist   General bed mobility comments: self assists L LE over edge of stretcher with L hand holding pants leg, assist to raise trunk, assist for LEs back into bed    Transfers Overall transfer level: Needs assistance Equipment used: Rolling walker (2 wheels) Transfers: Sit to/from Stand, Bed to chair/wheelchair/BSC Sit to Stand: +2 physical assistance, Min assist, Mod assist     Step pivot transfers: +2 physical assistance, Min assist, Mod assist     General transfer comment: +2 mod stretcher to chair without AD to steady, +2 min to rise and steady with RW from chair to bed, cues for hand placement, pt tremulous with knees buckling in static standing      Balance Overall balance assessment: Needs assistance   Sitting balance-Leahy Scale: Fair     Standing balance support: Bilateral upper extremity supported Standing balance-Leahy Scale: Poor                             ADL either performed or assessed with clinical judgement   ADL Overall ADL's : Needs assistance/impaired Eating/Feeding: NPO   Grooming: Set up;Sitting   Upper Body Bathing: Moderate assistance;Sitting   Lower Body Bathing: +2 for physical assistance;Sit to/from stand;Maximal assistance   Upper Body Dressing : Moderate assistance;Sitting   Lower Body Dressing: Total assistance;Sitting/lateral leans Lower Body Dressing Details (indicate cue type and reason): for shoes             Functional mobility during ADLs: +2 for physical assistance;Minimal assistance;Moderate assistance;Rolling walker (2 wheels)  Vision Baseline Vision/History: 2 Legally blind Ability to See in Adequate Light: 3 Highly impaired Patient Visual Report: No change from baseline       Perception         Praxis         Pertinent Vitals/Pain Pain Assessment Pain Assessment: 0-10 Pain Score: 4  Pain Location: head, back Pain  Descriptors / Indicators: Aching Pain Intervention(s): Monitored during session, Repositioned     Extremity/Trunk Assessment Upper Extremity Assessment Upper Extremity Assessment: Right hand dominant;LUE deficits/detail LUE Deficits / Details: inconsistent performance during MMT vs functional use, 2/5 FF, 3/5 elbow and forearm, 3/5 gross grasp, ataxic quality to movement with MMT, later observed to self assist L LE into bed with L UE and place behind him to help him scoot LUE Sensation: decreased light touch (slowed response during proprioception testing, but correct responses) LUE Coordination: decreased fine motor;decreased gross motor   Lower Extremity Assessment Lower Extremity Assessment: Defer to PT evaluation   Cervical / Trunk Assessment Cervical / Trunk Assessment: Normal   Communication Communication Communication: Impaired Factors Affecting Communication: Reduced clarity of speech   Cognition Arousal: Alert Behavior During Therapy: Flat affect Cognition: Cognition impaired   Orientation impairments: Situation, Place Awareness: Intellectual awareness intact, Online awareness impaired Memory impairment (select all impairments): Short-term memory                       Following commands: Impaired Following commands impaired: Follows one step commands with increased time     Cueing  General Comments   Cueing Techniques: Verbal cues      Exercises     Shoulder Instructions      Home Living Family/patient expects to be discharged to:: Unsure                                 Additional Comments: pt has been staying in a motel      Prior Functioning/Environment Prior Level of Function : Independent/Modified Independent             Mobility Comments: walks with a rollator, takes the bus ADLs Comments: goes to the shelter to eat, independent in self care    OT Problem List: Decreased strength;Impaired balance (sitting and/or  standing);Decreased coordination;Decreased cognition;Decreased safety awareness;Decreased knowledge of use of DME or AE;Obesity;Impaired UE functional use;Pain   OT Treatment/Interventions: Self-care/ADL training;DME and/or AE instruction;Therapeutic activities;Cognitive remediation/compensation;Patient/family education;Balance training;Neuromuscular education      OT Goals(Current goals can be found in the care plan section)   Acute Rehab OT Goals OT Goal Formulation: With patient Time For Goal Achievement: 11/08/24 Potential to Achieve Goals: Good ADL Goals Pt Will Perform Eating: with modified independence;sitting Pt Will Perform Grooming: with min assist;standing Pt Will Perform Upper Body Dressing: with supervision;sitting Pt Will Perform Lower Body Dressing: with min assist;sit to/from stand Pt Will Transfer to Toilet: with min assist;ambulating;bedside commode Pt Will Perform Toileting - Clothing Manipulation and hygiene: with min assist;sit to/from stand Additional ADL Goal #1: Pt will complete bed mobility mod I in preparation for ADLs.   OT Frequency:  Min 2X/week    Co-evaluation PT/OT/SLP Co-Evaluation/Treatment: Yes Reason for Co-Treatment: For patient/therapist safety   OT goals addressed during session: ADL's and self-care      AM-PAC OT 6 Clicks Daily Activity     Outcome Measure Help from another person eating meals?: A Little Help from another person taking care of  personal grooming?: A Little Help from another person toileting, which includes using toliet, bedpan, or urinal?: A Lot Help from another person bathing (including washing, rinsing, drying)?: A Lot Help from another person to put on and taking off regular upper body clothing?: A Lot Help from another person to put on and taking off regular lower body clothing?: Total 6 Click Score: 13   End of Session Equipment Utilized During Treatment: Gait belt;Rolling walker (2 wheels) Nurse Communication:  Mobility status  Activity Tolerance: Patient tolerated treatment well Patient left: in bed;with call bell/phone within reach  OT Visit Diagnosis: Unsteadiness on feet (R26.81);Pain;Other symptoms and signs involving cognitive function;Muscle weakness (generalized) (M62.81);Hemiplegia and hemiparesis Hemiplegia - Right/Left: Left Hemiplegia - dominant/non-dominant: Non-Dominant Hemiplegia - caused by: Cerebral infarction                Time: 8743-8682 OT Time Calculation (min): 21 min Charges:  OT General Charges $OT Visit: 1 Visit OT Evaluation $OT Eval Moderate Complexity: 1 Mod  Mliss HERO, OTR/L Acute Rehabilitation Services Office: [phone]   Kennth Mliss Helling 10/25/2024, 2:07 PM

## 2024-10-25 NOTE — ED Notes (Signed)
 CCMD called and notified

## 2024-10-25 NOTE — Evaluation (Signed)
 Speech Language Pathology Evaluation Patient Details Name: Stanley Stone MRN: [id number] DOB: Apr 11, 1966 Today's Date: 10/25/2024 Time: 8893-8884 SLP Time Calculation (min) (ACUTE ONLY): 9 min  Problem List:  Patient Active Problem List   Diagnosis Date Noted   TIA (transient ischemic attack) 10/25/2024   Vision loss, bilateral 08/01/2023   Dental caries 02/28/2023   Hyperlipidemia associated with type 2 diabetes mellitus (HCC) 06/27/2022   History of ischemic right MCA stroke with residual deficit 03/25/2022   Anxiety state 02/18/2022   Onychomycosis 01/18/2022   Chronic bronchitis, unspecified chronic bronchitis type (HCC) 01/18/2022   DM (diabetes mellitus), type 2 (HCC) 02/07/2019   HTN (hypertension) 02/07/2019   History of alcohol abuse 02/07/2019   Tobacco use 02/07/2019   Past Medical History:  Past Medical History:  Diagnosis Date   AKI (acute kidney injury) 07/20/2022   Closed fracture dislocation of lumbar spine (HCC) 1987   COPD (chronic obstructive pulmonary disease) (HCC)    COVID-19 virus infection 07/20/2022   CVA (cerebral vascular accident) (HCC) 03/25/2022   Diabetes mellitus without complication (HCC)    GERD (gastroesophageal reflux disease)    High cholesterol    Homelessness 02/07/2019   Hypertension    Past Surgical History: History reviewed. No pertinent surgical history. HPI:  Stanley Stone is a 58 yo male presenting 12/11 with two days of worsening L weakness, worsening vision, and slurred speech. No acute abnormality per radiology report, but per neurology note, there is a punctate central hyperintensity at the pontomedullary junction. 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 Previous MBS in July 2025 revealed oropharyngeal deficits and suspected esophageal component but airway protection was complete. Regular diet and thin liquids were recommended. SLP services throughout that admission otherwise focused on  communication/fluency. PMH includes: stroke with residual L weakness and bilateral vision loss, DM, HTN, hypercholesterolemia, COPD, AKI, medication noncompliance, EtOH abuse, polysubstance abuse, unhoused, GERD   Assessment / Plan / Recommendation Clinical Impression  Pt reports acute changes in his speech from his baseline as well as dysphonia over the past month. Recommend considering ENT consult. With initial assessment of cognition pt is suspected to be near his baseline, although eval was shortened as pt needed to be moved to a different room. Further f/u indicated at least acutely for further differential diagnosis of higher level functioning.   Pt is oriented and aware of situation, provides history and follows commands appropriately. He completed a delayed recall task and verbal problem solving tasks with complete accuracy. He sustained his attention to tasks with SLP appropriately. His speech overall is intelligible despite his report of being altered from baseline. Dysarthria is felt to be mild, but there is also potential impact from mass that has been impacting his vocal quality. Note that ENT consultation is being considered.      SLP Assessment  SLP Recommendation/Assessment: Patient needs continued Speech Language Pathology Services SLP Visit Diagnosis: Dysarthria and anarthria (R47.1)     Assistance Recommended at Discharge     Functional Status Assessment Patient has had a recent decline in their functional status and demonstrates the ability to make significant improvements in function in a reasonable and predictable amount of time.  Frequency and Duration min 1 x/week  1 week      SLP Evaluation Cognition  Overall Cognitive Status: Within Functional Limits for tasks assessed (suspect near baseline, will need further assessment of higher level function)       Comprehension  Auditory Comprehension Overall Auditory Comprehension: Appears within  functional limits for tasks  assessed    Expression Expression Primary Mode of Expression: Verbal Verbal Expression Overall Verbal Expression: Appears within functional limits for tasks assessed   Oral / Motor  Oral Motor/Sensory Function Overall Oral Motor/Sensory Function: Other (comment) (reduced ROM bilaterally when asked to move lips) Motor Speech Overall Motor Speech: Impaired Respiration: Within functional limits Phonation: Hoarse Resonance: Within functional limits Articulation: Impaired Level of Impairment: Conversation Intelligibility: Intelligible            Leita SAILOR., M.A. CCC-SLP Acute Rehabilitation Services Office: [phone]  Secure chat preferred  10/25/2024, 12:34 PM

## 2024-10-25 NOTE — Progress Notes (Signed)
 Subjective: Patient admitted this morning, see detailed H&P by Dr Dena  58 y.o. male with medical history significant of prior CVA, COPD, diabetes, hypertension, hyperlipidemia who presented emergency department due to slurred speech, vision changes and left-sided weakness.  CTA head and neck showed carotid mass and severe stenosis of vertebral artery. MRI brain showed no acute infarcts. Neurology was consulted and patient was admitted further workup   Vitals:   10/25/24 0200 10/25/24 0301  BP: (!) 165/98   Pulse: 79   Resp: 10   Temp:  98.1 F (36.7 C)  SpO2: 96%       A/P  # Slurred speech and weakness most likely secondary to recrudescence of prior stroke # Transischemic attack - MRI negative for acute stroke - Neurology consulted - Stenosis of vertebral arteries Plan: Appreciate neurology recommendations Continue statin Continue aspirin    # Parotid mass-2.1 cm right parotid mass with interval increase in abnormally heterogeneous rounded 1.1 cm right level 2 node concerning with primary parotid neoplasm with progressive nodal metastasis.  Called and discussed with Dr. Roark, he will see patient as outpatient within 1 week.  Sent patient information to Dr. Roark on secure chat.  # Dysphagia-Speech therapy evaluation obtained, patient to go for MBS   # Hypertension-continue amlodipine , carvedilol , irbesartan, hydrochlorothiazide    # GERD-continue Protonix    # Depression-continue Zoloft     Stanley Stone Stanley Stone [facility]

## 2024-10-25 NOTE — Progress Notes (Addendum)
 He STROKE TEAM PROGRESS NOTE   SUBJECTIVE (INTERVAL HISTORY)  Pt reports that he is extremely visually impaired in both eyes. Lives alone, and says he is able to get by because his vision is good enough.  OBJECTIVE Temp:  [97.5 F (36.4 C)-98.7 F (37.1 C)] 98.3 F (36.8 C) (12/12 1226) Pulse Rate:  [70-97] 70 (12/12 1015) Cardiac Rhythm: Other (Comment) (12/11 2247) Resp:  [10-18] 10 (12/12 1015) BP: (126-241)/(77-102) 155/83 (12/12 1015) SpO2:  [82 %-100 %] 94 % (12/12 1015) Weight:  [82 kg] 82 kg (12/11 2137)  Recent Labs  Lab 10/24/24 2149 10/25/24 0112 10/25/24 0236  GLUCAP 467* 297* 275*   Recent Labs  Lab 10/24/24 2148 10/24/24 2158 10/25/24 0313 10/25/24 0735 10/25/24 1210  NA 134* 135 136 140 136  K 3.7 3.6 3.6 3.4* 3.8  CL 97* 98 102 105 101  CO2 21*  --  21* 27 26  GLUCOSE 467* 486* 231* 120* 251*  BUN 15 19 13 12 10   CREATININE 1.14 1.20 0.99 1.11 1.07  CALCIUM  9.3  --  8.6* 8.7* 8.4*   Recent Labs  Lab 10/24/24 2148  AST 17  ALT 23  ALKPHOS 82  BILITOT 0.7  PROT 7.2  ALBUMIN 3.7   Recent Labs  Lab 10/24/24 2148 10/24/24 2158 10/25/24 0735  WBC 8.4  --  7.7  NEUTROABS 5.4  --   --   HGB 15.8 16.7 14.6  HCT 46.5 49.0 43.1  MCV 89.6  --  89.4  PLT 208  --  176   No results for input(s): CKTOTAL, CKMB, CKMBINDEX, TROPONINI in the last 168 hours. Recent Labs    10/24/24 2148  LABPROT 12.2  INR 0.9   Recent Labs    10/25/24 0257  COLORURINE STRAW*  LABSPEC 1.021  PHURINE 6.0  GLUCOSEU >=500*  HGBUR SMALL*  BILIRUBINUR NEGATIVE  KETONESUR NEGATIVE  PROTEINUR 30*  NITRITE NEGATIVE  LEUKOCYTESUR NEGATIVE       Component Value Date/Time   CHOL 195 10/25/2024 0735   CHOL 233 (H) 10/27/2022 1018   TRIG 142 10/25/2024 0735   HDL 38 (L) 10/25/2024 0735   HDL 54 10/27/2022 1018   CHOLHDL 5.1 10/25/2024 0735   VLDL 28 10/25/2024 0735   LDLCALC 129 (H) 10/25/2024 0735   LDLCALC 150 (H) 10/27/2022 1018   Lab Results   Component Value Date   HGBA1C 9.6 (H) 10/25/2024      Component Value Date/Time   LABOPIA NONE DETECTED 10/24/2024 2120   COCAINSCRNUR NONE DETECTED 10/24/2024 2120   LABBENZ NONE DETECTED 10/24/2024 2120   AMPHETMU NONE DETECTED 10/24/2024 2120   THCU NONE DETECTED 10/24/2024 2120   LABBARB NONE DETECTED 10/24/2024 2120    Recent Labs  Lab 10/24/24 2148  ETH 66*    I have personally reviewed the radiological images below and agree with the radiology interpretations.  MR BRAIN WO CONTRAST Result Date: 10/25/2024 EXAM: MRI BRAIN WITHOUT CONTRAST 10/25/2024 01:00:00 AM TECHNIQUE: Multiplanar multisequence MRI of the head/brain was performed without the administration of intravenous contrast. COMPARISON: MRI head 09/22/2022. CLINICAL HISTORY: Neuro deficit, acute, stroke suspected FINDINGS: BRAIN AND VENTRICLES: No acute infarct. Remote left PCA territory infarct. Remote left basal ganglia and left frontal infarcts. No intracranial hemorrhage. No mass. No midline shift. No hydrocephalus. The sella is unremarkable. Diminuitive left MCA flow void compatible with known left MCA occlusion seen on same day CTA. ORBITS: No acute abnormality. SINUSES AND MASTOIDS: Left maxillary sinus mucosal thickening. BONES AND  SOFT TISSUES: Normal marrow signal. Right parotid mass bettter charcterized on same day CTA head/neck. IMPRESSION: 1. No acute intracranial abnormality. 2. Remote left basal ganglia, left frontal and left PCA territory infarcts. 3. Right parotid mass bettter charcterized on same day CTA head/neck. Electronically signed by: Gilmore Molt 10/25/2024 01:34 AM EST RP Workstation: [id number]   CT Head Wo Contrast Result Date: 10/24/2024 EXAM: CT HEAD WITHOUT 10/24/2024 10:16:29 PM TECHNIQUE: CT of the head was performed without the administration of intravenous contrast. Automated exposure control, iterative reconstruction, and/or weight based adjustment of the mA/kV was utilized to reduce  the radiation dose to as low as reasonably achievable. COMPARISON: CT code stroke May 25, 2024 CLINICAL HISTORY: Headache, new onset (Age >= 51y) FINDINGS: BRAIN AND VENTRICLES: No acute intracranial hemorrhage. Similar remote left PCA territory, left basal ganglia, and left frontal cortical infarcts. No mass effect or midline shift. No extra-axial fluid collection. No evidence of acute infarct. No hydrocephalus. ORBITS: No acute abnormality. SINUSES AND MASTOIDS: Left maxillary sinus mucosal thickening. No mastoid effusions. SOFT TISSUES AND SKULL: No acute skull fracture. No acute soft tissue abnormality. IMPRESSION: 1. No acute intracranial abnormality. ASPECTS 10. 2. Similar remote left PCA territory, left basal ganglia, and left frontal cortical infarcts. Electronically signed by: Gilmore Molt 10/24/2024 10:58 PM EST RP Workstation: [id number]   CT ANGIO HEAD NECK W WO CM Result Date: 10/24/2024 EXAM: CTA Head and Neck with Intravenous Contrast. CT Head without Contrast. CLINICAL HISTORY: Neuro deficit, acute, stroke suspected TECHNIQUE: Axial CTA images of the head and neck performed with intravenous contrast. MIP reconstructed images were created and reviewed. Axial computed tomography images of the head/brain performed without intravenous contrast. Note: Per PQRS, the description of internal carotid artery percent stenosis, including 0 percent or normal exam, is based on [employer] Symptomatic Carotid Endarterectomy Trial (NASCET) criteria. Dose reduction technique was used including one or more of the following: automated exposure control, adjustment of mA and kV according to patient size, and/or iterative reconstruction. CONTRAST: 75 mL Omnipaque  COMPARISON: CTA head/neck May 25, 2024 FINDINGS: CT HEAD: BRAIN: Similar appearance of remote left PCA territory, left basal ganglia, and left frontal cortical infarcts. No acute intraparenchymal hemorrhage. No mass lesion. No CT evidence for acute  territorial infarct. No midline shift or extra-axial collection. VENTRICLES: No hydrocephalus. ORBITS: The orbits are unremarkable. SINUSES AND MASTOIDS: Left maxillary sinus and scattered ethmoid air cells mucosal thickening. No mastoid effusion CTA NECK: COMMON CAROTID ARTERIES: No significant stenosis. No dissection or occlusion. INTERNAL CAROTID ARTERIES: No stenosis by NASCET criteria. No dissection or occlusion. 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 No dissection or occlusion. CTA HEAD: ANTERIOR CEREBRAL ARTERIES: No significant stenosis. No occlusion. No aneurysm. MIDDLE CEREBRAL ARTERIES: Similar left M1 MCA occlusion with more distal reconstitution. No significant stenosis of the right MCA. No aneurysm. POSTERIOR CEREBRAL ARTERIES: No significant stenosis. No occlusion. No aneurysm. BASILAR ARTERY: No significant stenosis. No occlusion. No aneurysm. OTHER: SOFT TISSUES: Similar 2.1 cm mass that appears to be arising from the right parotid mass. Increase in abnormally heterogeneous, rounded and now 1.1 cm right level II node on series 10, image 104. BONES: No acute osseous abnormality. IMPRESSION: 1. No evidence of acute intracranial abnormality. ASPECTS 10. 2. Similar left M1 MCA occlusion with more distal reconstitution. 3. 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 4. Similar 2.1 cm suspected right parotid mass with interval increase in abnormally heterogeneous, rounded and now 1.1 cm right level  II node. 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 Recommend ENT consultation if not already made. Electronically signed by: Gilmore Molt 10/24/2024 10:55 PM EST RP Workstation: [id number]     PHYSICAL EXAM  Temp:  [97.5 F (36.4 C)-98.7 F (37.1 C)] 98.3 F (36.8 C) (12/12 1226) Pulse Rate:  [70-97] 70 (12/12 1015) Resp:  [10-18] 10 (12/12 1015) BP: (126-241)/(77-102) 155/83 (12/12  1015) SpO2:  [82 %-100 %] 94 % (12/12 1015) Weight:  [82 kg] 82 kg (12/11 2137)  General - Well nourished, well developed, in no apparent distress.  Cardiovascular - Regular rhythm and rate.  Mental Status -  Level of arousal and orientation to time, place, and person were intact. Language including expression, naming, repetition, comprehension was assessed and found intact. Attention span and concentration were normal. Recent and remote memory were intact. Fund of Knowledge was assessed and was intact.  Cranial Nerves II - XII - II - Unable to assess visual fields due to such poor visual acuity. III, IV, VI - Extraocular movements intact. V - Facial sensation intact bilaterally, though less on L. VII - Facial movement intact bilaterally. VIII - Hearing & vestibular intact bilaterally. X - Palate elevates symmetrically. XI - Chin turning & shoulder shrug intact bilaterally. XII - Tongue protrusion intact.  Motor Strength - Pt has give-away weakness in left arm and leg, and exam was limited from joint pain. Bulk was normal and fasciculations were absent.   Motor Tone - Muscle tone was assessed at the neck and appendages and was normal.  Sensory - Light touch, were assessed and were less, but present, on the left..    Coordination - The patient had normal movements in the hands and feet with no ataxia or dysmetria.  Tremor was absent.  Gait and Station - deferred.   ASSESSMENT/PLAN Stanley Stone is a 58 y.o. male with history of DM, HTN, HLD, COPD, polysubstance abuse, and medication non-compliance admitted for stroke-like symptoms.   Likely recrudescence of old stroke symptoms due to headache and hypertensive emergency MRI: No acute abnormality. Remote L basal ganglia, L frontal, and L PCA infarcts. R parotid mass. CT: No acute infarct.  CTA: No acute abnormality.  Chronic left M1 occlusion, severe left V2 proximal stenosis.   2D Echo: Pending LDL 129 HgbA1c 9.6 UDS  negative Lovenox  for VTE prophylaxis aspirin  325 mg daily prior to admission, now on home aspirin  325 mg daily.  Patient counseled to be compliant with his antithrombotic medications Ongoing aggressive stroke risk factor management Therapy recommendations: Pending Disposition: Pending  History of stroke/stroke recrudescence 03/2022 admitted for right-sided weakness and numbness.  CT showed no acute abnormality, old left BG and caudate head infarct.  MRI no acute infarct.  MRA head and neck left MCA occlusion, right M1 high-grade stenosis, severe intracranial stenosis.  EF 60 to 65%, LDL 76, A1c 8.2.  Discharged on DAPT for 3 months and Lipitor 40 09/2023 admitted for left PCA infarct.  CTA head and neck showed left M1 chronic occlusion, severe left P2 stenosis, moderate left VA stenosis.  Discharged on DAPT and Lipitor 80. 05/2024 admitted for left leg weakness.  MRI questionable right pontine infarct, which not convincing to me, likely recrudescence of stroke.  Status post TNK. Baseline residual left-sided mild weakness in bilateral vision loss.  Diabetes HgbA1c 9.6 goal < 7.0 Uncontrolled Currently on Farxiga , Trulicity , Glipizide , noncompliant with medication at home CBG monitoring SSI DM education and close PCP follow up  Hypertension Hypertensive emergency On presentation BP 241/100 with headache Stable now Home medications: Norvasc , Coreg , Diovan -HCT, but not compliant Now on home Norvasc  10, Coreg  25, Avapro 300, HCTZ 25 Gradually normalize BP in 2 to 3 days Long term BP goal normotensive  Hyperlipidemia Home meds: Lipitor 80mg  daily (not compliance) LDL 129, goal < 70 Now on home Lipitor 80mg  daily Continue statin at discharge  Right parotid mass CTA showed R parotid mass concerning for parotid neoplasm. Radiology recommended ENT consult.  Tobacco abuse Current smoker Smoking cessation counseling provided Pt is willing to quit  Other Stroke Risk Factors ETOH use,  patient stated he has not drunk for 3 days, admission alcohol level 66.  Alcohol limitation education provided. Obesity, Body mass index is 30.08 kg/m.  History of substance abuse  Other medical issues Severe bilateral vision loss, chronic for 2 years per patient COPD Homeless  Hospital day # 0  Recommend encouraging PT/OT/SLP for symptomatic improvement and medication adherence to reduce risk factors for stroke. Neurology will be signing off. Please reach out again if there are any other concerns.  CHARM Penne Mori, DO, PGY1 Neurology Stroke Team 10/25/2024 1:27 PM  ATTENDING NOTE: I reviewed above note and agree with the assessment and plan. Pt was seen and examined.   No family at the bedside. Pt is awake, alert, eyes open, mildly lethargic, orientated to age, place, time. No aphasia with mild dysarthria, answer question with short answers, paucity of speech, following all simple commands. Able to name and repeat. No gaze palsy, but significantly decreased visual acuity, both eyes can only see hand waving, not able to count fingers, tracking bilaterally, visual field difficulty testing due to significant visual acuity difficulty. No significant facial droop. Tongue midline. RUE and RLE at least 4/5 but LUE and LLE with some giveaway weakness and pain on the shoulder and hip. Sensation decreased on the left, b/l FTN intact, gait not tested.   For detailed assessment and plan, please refer to above as I have made changes wherever appropriate.   Neurology will sign off. Please call with questions. Pt will follow up with stroke clinic NP at [facility] in about 4 weeks. Thanks for the consult.   Ary Cummins, MD PhD Stroke Neurology 10/25/2024 2:57 PM    To contact Stroke Continuity provider, please refer to [email or web address]. After hours, contact General Neurology

## 2024-10-26 DIAGNOSIS — K118 Other diseases of salivary glands: Secondary | ICD-10-CM | POA: Diagnosis not present

## 2024-10-26 DIAGNOSIS — I161 Hypertensive emergency: Secondary | ICD-10-CM | POA: Diagnosis not present

## 2024-10-26 DIAGNOSIS — I10 Essential (primary) hypertension: Secondary | ICD-10-CM | POA: Diagnosis not present

## 2024-10-26 DIAGNOSIS — G8194 Hemiplegia, unspecified affecting left nondominant side: Secondary | ICD-10-CM

## 2024-10-26 LAB — BASIC METABOLIC PANEL WITH GFR
Anion gap: 12 (ref 5–15)
BUN: 15 mg/dL (ref 6–20)
CO2: 24 mmol/L (ref 22–32)
Calcium: 8.7 mg/dL — ABNORMAL LOW (ref 8.9–10.3)
Chloride: 99 mmol/L (ref 98–111)
Creatinine, Ser: 1.14 mg/dL (ref 0.61–1.24)
GFR, Estimated: >60 mL/min (ref >60)
Glucose, Bld: 261 mg/dL — ABNORMAL HIGH (ref 70–99)
Potassium: 3.7 mmol/L (ref 3.5–5.1)
Sodium: 135 mmol/L (ref 135–145)

## 2024-10-26 LAB — GLUCOSE, CAPILLARY
Glucose-Capillary: 257 mg/dL — ABNORMAL HIGH (ref 70–99)
Glucose-Capillary: 345 mg/dL — ABNORMAL HIGH (ref 70–99)
Glucose-Capillary: 242 mg/dL — ABNORMAL HIGH (ref 70–99)
Glucose-Capillary: 314 mg/dL — ABNORMAL HIGH (ref 70–99)

## 2024-10-26 MED ORDER — INSULIN ASPART 100 UNIT/ML IJ SOLN
0.0000 [IU] | Freq: Every day | INTRAMUSCULAR | Status: DC
  Administered 2024-10-26: 4 [IU] via SUBCUTANEOUS
  Filled 2024-10-26: qty 4

## 2024-10-26 MED ORDER — INSULIN ASPART 100 UNIT/ML IJ SOLN
0.0000 [IU] | Freq: Three times a day (TID) | INTRAMUSCULAR | Status: DC
  Administered 2024-10-26: 7 [IU] via SUBCUTANEOUS
  Administered 2024-10-26: 3 [IU] via SUBCUTANEOUS
  Administered 2024-10-26 – 2024-10-27 (×2): 5 [IU] via SUBCUTANEOUS
  Filled 2024-10-26 (×2): qty 5
  Filled 2024-10-26: qty 7
  Filled 2024-10-26: qty 3

## 2024-10-26 MED ORDER — NICOTINE 21 MG/24HR TD PT24
21.0000 mg | MEDICATED_PATCH | Freq: Every day | TRANSDERMAL | Status: DC
  Administered 2024-10-26 – 2024-10-30 (×5): 21 mg via TRANSDERMAL
  Filled 2024-10-26 (×5): qty 1

## 2024-10-26 MED ORDER — INSULIN GLARGINE 100 UNIT/ML ~~LOC~~ SOLN
20.0000 [IU] | Freq: Every day | SUBCUTANEOUS | Status: DC
  Administered 2024-10-26 – 2024-10-27 (×2): 20 [IU] via SUBCUTANEOUS
  Filled 2024-10-26 (×2): qty 0.2

## 2024-10-26 NOTE — Plan of Care (Addendum)
 0450 Pt had elevated BP of 167/99 Map 116, MD made aware. Problem: Ischemic Stroke/TIA Tissue Perfusion: Goal: Complications of ischemic stroke/TIA will be minimized Outcome: Progressing   Problem: Coping: Goal: Will verbalize positive feelings about self Outcome: Progressing   Problem: Health Behavior/Discharge Planning: Goal: Ability to manage health-related needs will improve Outcome: Progressing   Problem: Self-Care: Goal: Ability to participate in self-care as condition permits will improve Outcome: Progressing   Problem: Self-Care: Goal: Ability to communicate needs accurately will improve Outcome: Progressing   Problem: Nutrition: Goal: Risk of aspiration will decrease Outcome: Progressing   Problem: Clinical Measurements: Goal: Will remain free from infection Outcome: Progressing   Problem: Activity: Goal: Risk for activity intolerance will decrease Outcome: Progressing   Problem: Coping: Goal: Level of anxiety will decrease Outcome: Progressing   Problem: Safety: Goal: Ability to remain free from injury will improve Outcome: Progressing   Problem: Skin Integrity: Goal: Risk for impaired skin integrity will decrease Outcome: Progressing

## 2024-10-26 NOTE — Progress Notes (Signed)
 [facility]  PROGRESS NOTE  Stanley Stone FMW:[id number] DOB: 08/20/1966 DOA: 10/24/2024 PCP: [facility]   Brief HPI:   58 y.o. male with medical history significant of prior CVA, COPD, diabetes, hypertension, hyperlipidemia who presented emergency department due to slurred speech, vision changes and left-sided weakness.  CTA head and neck showed carotid mass and severe stenosis of vertebral artery. MRI brain showed no acute infarcts. Neurology was consulted and patient was admitted further workup     Assessment/Plan:   # Slurred speech and weakness most likely secondary to recrudescence of prior stroke due to hypertensive emergency - MRI negative for acute stroke - Neurology consulted - Stenosis of vertebral arteries Appreciate neurology recommendations Continue statin Continue aspirin  325 milligram p.o. daily - Patient to go to skilled nursing facility for rehab  # Parotid mass -2.1 cm right parotid mass with interval increase in abnormally heterogeneous rounded 1.1 cm right level 2 node concerning with primary parotid neoplasm with progressive nodal metastasis.   ENT consulted, Dr. Salina saw the patient.  Plan for workup of parotid mass as outpatient.   # Dysphagia -Speech therapy evaluation obtained, patient to go for MBS   # Hypertension -continue amlodipine , carvedilol , irbesartan , hydrochlorothiazide    # GERD -continue Protonix    # Depression -continue Zoloft    # Diabetes mellitus type 2 - CBG has been elevated - Will start Lantus  20 units subcu daily - Continue sliding scale insulin  with NovoLog      DVT prophylaxis: Lovenox   Medications     amLODipine   10 mg Oral Daily   aspirin   325 mg Oral Daily   atorvastatin   80 mg Oral QPM   carvedilol   25 mg Oral BID WC   enoxaparin  (LOVENOX ) injection  40 mg Subcutaneous Daily   irbesartan   300 mg Oral Daily   And   hydrochlorothiazide   25 mg Oral Daily   insulin  aspart  0-5  Units Subcutaneous QHS   insulin  aspart  0-9 Units Subcutaneous TID WC   insulin  glargine  20 Units Subcutaneous Daily   nicotine   21 mg Transdermal Daily   pantoprazole   40 mg Oral Daily   sertraline   50 mg Oral Daily     Data Reviewed:   CBG:  Recent Labs  Lab 10/25/24 1706 10/25/24 2059 10/26/24 0631 10/26/24 1214 10/26/24 1612  GLUCAP 231* 346* 257* 345* 242*    SpO2: 97 %    Vitals:   10/26/24 0425 10/26/24 0817 10/26/24 1126 10/26/24 1612  BP: (!) 167/99 (!) 160/94 (!) 138/109 124/74  Pulse: 68 74 77 70  Resp: 18 18 16 18   Temp: 98.4 F (36.9 C) 98.6 F (37 C) 97.8 F (36.6 C) 97.9 F (36.6 C)  TempSrc: Oral Oral Oral Oral  SpO2: 94% 94% 95% 97%  Weight:      Height:          Data Reviewed:  Basic Metabolic Panel: Recent Labs  Lab 10/24/24 2148 10/24/24 2158 10/25/24 0313 10/25/24 0735 10/25/24 1210 10/26/24 0507  NA 134* 135 136 140 136 135  K 3.7 3.6 3.6 3.4* 3.8 3.7  CL 97* 98 102 105 101 99  CO2 21*  --  21* 27 26 24   GLUCOSE 467* 486* 231* 120* 251* 261*  BUN 15 19 13 12 10 15   CREATININE 1.14 1.20 0.99 1.11 1.07 1.14  CALCIUM  9.3  --  8.6* 8.7* 8.4* 8.7*    CBC: Recent Labs  Lab 10/24/24 2148 10/24/24 2158 10/25/24 0735  WBC 8.4  --  7.7  NEUTROABS 5.4  --   --   HGB 15.8 16.7 14.6  HCT 46.5 49.0 43.1  MCV 89.6  --  89.4  PLT 208  --  176    LFT Recent Labs  Lab 10/24/24 2148  AST 17  ALT 23  ALKPHOS 82  BILITOT 0.7  PROT 7.2  ALBUMIN 3.7     Antibiotics: Anti-infectives (From admission, onward)    None        CONSULTS neurology  Code Status: DNR  Family Communication:      Subjective   Denies any complaints   Objective    Physical Examination:   General-appears in no acute distress Heart-S1-S2, regular, no murmur auscultated Lungs-clear to auscultation bilaterally, no wheezing or crackles auscultated Abdomen-soft, nontender, no organomegaly Extremities-no edema in the lower  extremities Neuro-alert, oriented x3, left hemiparesis            Stanley Stone S Corion Sherrod   [facility] If 7PM-7AM, please contact night-coverage at [email or web address], Office  [phone]   10/26/2024, 5:43 PM  LOS: 0 days

## 2024-10-26 NOTE — Consult Note (Signed)
 Reason for Consult: Parotid mass Referring Physician: Dr. Drusilla Lonni Mt is an 58 y.o. male.  HPI: History of multiple medical problems including uncontrolled diabetes.  He had workup for stroke and medical issues and found to have a incidental parotid mass.  The mass is in the right side.  There is also a lymph node that is noted this scan.  He does not have any symptoms of his parotid area.  He did not know the mass was present.  He is having ongoing swallowing issues being evaluated by speech therapy.  No airway problems.  No facial nerve weakness.  Past Medical History:  Diagnosis Date   AKI (acute kidney injury) 07/20/2022   Closed fracture dislocation of lumbar spine (HCC) 1987   COPD (chronic obstructive pulmonary disease) (HCC)    COVID-19 virus infection 07/20/2022   CVA (cerebral vascular accident) (HCC) 03/25/2022   Diabetes mellitus without complication (HCC)    GERD (gastroesophageal reflux disease)    High cholesterol    Homelessness 02/07/2019   Hypertension     History reviewed. No pertinent surgical history.  History reviewed. No pertinent family history.  Social History:  reports that he has been smoking cigarettes. He has never used smokeless tobacco. He reports current alcohol use of about 1.0 standard drink of alcohol per week. He reports that he does not currently use drugs.  Allergies: Allergies[1]   Results for orders placed or performed during the hospital encounter of 10/24/24 (from the past 48 hours)  Urine rapid drug screen (hosp performed)     Status: None   Collection Time: 10/24/24  9:20 PM  Result Value Ref Range   Opiates NONE DETECTED NONE DETECTED   Cocaine NONE DETECTED NONE DETECTED   Benzodiazepines NONE DETECTED NONE DETECTED   Amphetamines NONE DETECTED NONE DETECTED   Tetrahydrocannabinol NONE DETECTED NONE DETECTED   Barbiturates NONE DETECTED NONE DETECTED    Comment: (NOTE) DRUG SCREEN FOR MEDICAL PURPOSES ONLY.  IF  CONFIRMATION IS NEEDED FOR ANY PURPOSE, NOTIFY LAB WITHIN 5 DAYS.  LOWEST DETECTABLE LIMITS FOR URINE DRUG SCREEN Drug Class                     Cutoff (ng/mL) Amphetamine and metabolites    1000 Barbiturate and metabolites    200 Benzodiazepine                 200 Opiates and metabolites        300 Cocaine and metabolites        300 THC                            50 Performed at [facility], [address] [place], [place] [address]   CBC with Differential     Status: None   Collection Time: 10/24/24  9:48 PM  Result Value Ref Range   WBC 8.4 4.0 - 10.5 K/uL   RBC 5.19 4.22 - 5.81 MIL/uL   Hemoglobin 15.8 13.0 - 17.0 g/dL   HCT 53.4 60.9 - 47.9 %   MCV 89.6 80.0 - 100.0 fL   MCH 30.4 26.0 - 34.0 pg   MCHC 34.0 30.0 - 36.0 g/dL   RDW 86.9 88.4 - 84.4 %   Platelets 208 150 - 400 K/uL   nRBC 0.0 0.0 - 0.2 %   Neutrophils Relative % 64 %   Neutro Abs 5.4 1.7 - 7.7 K/uL  Lymphocytes Relative 26 %   Lymphs Abs 2.2 0.7 - 4.0 K/uL   Monocytes Relative 8 %   Monocytes Absolute 0.7 0.1 - 1.0 K/uL   Eosinophils Relative 1 %   Eosinophils Absolute 0.1 0.0 - 0.5 K/uL   Basophils Relative 0 %   Basophils Absolute 0.0 0.0 - 0.1 K/uL   Immature Granulocytes 1 %   Abs Immature Granulocytes 0.04 0.00 - 0.07 K/uL    Comment: Performed at [facility], [address] [place], [place] [address]  Comprehensive metabolic panel     Status: Abnormal   Collection Time: 10/24/24  9:48 PM  Result Value Ref Range   Sodium 134 (L) 135 - 145 mmol/L   Potassium 3.7 3.5 - 5.1 mmol/L   Chloride 97 (L) 98 - 111 mmol/L   CO2 21 (L) 22 - 32 mmol/L   Glucose, Bld 467 (H) 70 - 99 mg/dL    Comment: Glucose reference range applies only to samples taken after fasting for at least 8 hours.   BUN 15 6 - 20 mg/dL   Creatinine, Ser 8.85 0.61 - 1.24 mg/dL   Calcium  9.3 8.9 - 10.3 mg/dL   Total Protein 7.2 6.5 - 8.1 g/dL   Albumin 3.7 3.5 - 5.0 g/dL   AST 17 15 - 41 U/L   ALT 23 0 - 44 U/L    Alkaline Phosphatase 82 38 - 126 U/L   Total Bilirubin 0.7 0.0 - 1.2 mg/dL   GFR, Estimated >39 >39 mL/min    Comment: (NOTE) Calculated using the CKD-EPI Creatinine Equation (2021)    Anion gap 16 (H) 5 - 15    Comment: Performed at [facility], [address], [place], [place] [address]  Protime-INR     Status: None   Collection Time: 10/24/24  9:48 PM  Result Value Ref Range   Prothrombin Time 12.2 11.4 - 15.2 seconds   INR 0.9 0.8 - 1.2    Comment: (NOTE) INR goal varies based on device and disease states. Performed at [facility], [address], [place], [place] [address]   Ethanol     Status: Abnormal   Collection Time: 10/24/24  9:48 PM  Result Value Ref Range   Alcohol, Ethyl (B) 66 (H) <15 mg/dL    Comment: (NOTE) For medical purposes only. Performed at [facility], [address] [place], [place] [address]   CBG monitoring, ED     Status: Abnormal   Collection Time: 10/24/24  9:49 PM  Result Value Ref Range   Glucose-Capillary 467 (H) 70 - 99 mg/dL    Comment: Glucose reference range applies only to samples taken after fasting for at least 8 hours.  I-stat chem 8, ed     Status: Abnormal   Collection Time: 10/24/24  9:58 PM  Result Value Ref Range   Sodium 135 135 - 145 mmol/L   Potassium 3.6 3.5 - 5.1 mmol/L   Chloride 98 98 - 111 mmol/L   BUN 19 6 - 20 mg/dL   Creatinine, Ser 8.79 0.61 - 1.24 mg/dL   Glucose, Bld 513 (H) 70 - 99 mg/dL    Comment: Glucose reference range applies only to samples taken after fasting for at least 8 hours.   Calcium , Ion 1.14 (L) 1.15 - 1.40 mmol/L   TCO2 22 22 - 32 mmol/L   Hemoglobin 16.7 13.0 - 17.0 g/dL   HCT 50.9 60.9 - 47.9 %  CBG  monitoring, ED     Status: Abnormal   Collection Time: 10/25/24  1:12 AM  Result Value Ref Range   Glucose-Capillary 297 (H) 70 - 99 mg/dL    Comment: Glucose reference range applies only to samples taken after fasting for at least 8 hours.  CBG monitoring, ED      Status: Abnormal   Collection Time: 10/25/24  2:36 AM  Result Value Ref Range   Glucose-Capillary 275 (H) 70 - 99 mg/dL    Comment: Glucose reference range applies only to samples taken after fasting for at least 8 hours.  Urinalysis, Routine w reflex microscopic -Urine, Clean Catch     Status: Abnormal   Collection Time: 10/25/24  2:57 AM  Result Value Ref Range   Color, Urine STRAW (A) YELLOW   APPearance CLEAR CLEAR   Specific Gravity, Urine 1.021 1.005 - 1.030   pH 6.0 5.0 - 8.0   Glucose, UA >=500 (A) NEGATIVE mg/dL   Hgb urine dipstick SMALL (A) NEGATIVE   Bilirubin Urine NEGATIVE NEGATIVE   Ketones, ur NEGATIVE NEGATIVE mg/dL   Protein, ur 30 (A) NEGATIVE mg/dL   Nitrite NEGATIVE NEGATIVE   Leukocytes,Ua NEGATIVE NEGATIVE   RBC / HPF 0-5 0 - 5 RBC/hpf   WBC, UA 0-5 0 - 5 WBC/hpf   Bacteria, UA NONE SEEN NONE SEEN   Squamous Epithelial / HPF 0-5 0 - 5 /HPF    Comment: Performed at [facility], [address] [place], [place] [address]  Basic metabolic panel     Status: Abnormal   Collection Time: 10/25/24  3:13 AM  Result Value Ref Range   Sodium 136 135 - 145 mmol/L   Potassium 3.6 3.5 - 5.1 mmol/L   Chloride 102 98 - 111 mmol/L   CO2 21 (L) 22 - 32 mmol/L   Glucose, Bld 231 (H) 70 - 99 mg/dL    Comment: Glucose reference range applies only to samples taken after fasting for at least 8 hours.   BUN 13 6 - 20 mg/dL   Creatinine, Ser 9.00 0.61 - 1.24 mg/dL   Calcium  8.6 (L) 8.9 - 10.3 mg/dL   GFR, Estimated >39 >39 mL/min    Comment: (NOTE) Calculated using the CKD-EPI Creatinine Equation (2021)    Anion gap 13 5 - 15    Comment: Performed at [facility], [address] [place], [place] [address]  Osmolality     Status: Abnormal   Collection Time: 10/25/24  3:13 AM  Result Value Ref Range   Osmolality 298 (H) 275 - 295 mOsm/kg    Comment: Performed at [facility], [address] [place], [place] [address]  Basic metabolic panel     Status:  Abnormal   Collection Time: 10/25/24  7:35 AM  Result Value Ref Range   Sodium 140 135 - 145 mmol/L   Potassium 3.4 (L) 3.5 - 5.1 mmol/L   Chloride 105 98 - 111 mmol/L   CO2 27 22 - 32 mmol/L   Glucose, Bld 120 (H) 70 - 99 mg/dL    Comment: Glucose reference range applies only to samples taken after fasting for at least 8 hours.   BUN 12 6 - 20 mg/dL   Creatinine, Ser 8.88 0.61 - 1.24 mg/dL   Calcium  8.7 (L) 8.9 - 10.3 mg/dL   GFR, Estimated >39 >39 mL/min    Comment: (NOTE) Calculated using the CKD-EPI Creatinine Equation (2021)    Anion gap 8 5 -  15    Comment: Performed at [facility], [address], [place], [place] [address]  CBC     Status: None   Collection Time: 10/25/24  7:35 AM  Result Value Ref Range   WBC 7.7 4.0 - 10.5 K/uL   RBC 4.82 4.22 - 5.81 MIL/uL   Hemoglobin 14.6 13.0 - 17.0 g/dL   HCT 56.8 60.9 - 47.9 %   MCV 89.4 80.0 - 100.0 fL   MCH 30.3 26.0 - 34.0 pg   MCHC 33.9 30.0 - 36.0 g/dL   RDW 86.8 88.4 - 84.4 %   Platelets 176 150 - 400 K/uL   nRBC 0.0 0.0 - 0.2 %    Comment: Performed at [facility], [address] [place], [place] [address]  Lipid panel     Status: Abnormal   Collection Time: 10/25/24  7:35 AM  Result Value Ref Range   Cholesterol 195 0 - 200 mg/dL   Triglycerides 857 <849 mg/dL   HDL 38 (L) >59 mg/dL   Total CHOL/HDL Ratio 5.1 RATIO   VLDL 28 0 - 40 mg/dL   LDL Cholesterol 870 (H) 0 - 99 mg/dL    Comment:        Total Cholesterol/HDL:CHD Risk Coronary Heart Disease Risk Table                     Men   Women  1/2 Average Risk   3.4   3.3  Average Risk       5.0   4.4  2 X Average Risk   9.6   7.1  3 X Average Risk  23.4   11.0        Use the calculated Patient Ratio above and the CHD Risk Table to determine the patient's CHD Risk.        ATP III CLASSIFICATION (LDL):  <100     mg/dL   Optimal  899-870  mg/dL   Near or Above                    Optimal  130-159  mg/dL   Borderline  839-810  mg/dL   High   >809     mg/dL   Very High Performed at [facility], [address], [place], [place] [address]   Hemoglobin A1c     Status: Abnormal   Collection Time: 10/25/24  7:35 AM  Result Value Ref Range   Hgb A1c MFr Bld 9.6 (H) 4.8 - 5.6 %    Comment: (NOTE) Diagnosis of Diabetes The following HbA1c ranges recommended by the American Diabetes Association (ADA) may be used as an aid in the diagnosis of diabetes mellitus.  Hemoglobin             Suggested A1C NGSP%              Diagnosis  <5.7                   Non Diabetic  5.7-6.4                Pre-Diabetic  >6.4                   Diabetic  <7.0                   Glycemic control for  adults with diabetes.     Mean Plasma Glucose 228.82 mg/dL    Comment: Performed at [facility], [address], [place], [place] [address]  Basic metabolic panel     Status: Abnormal   Collection Time: 10/25/24 12:10 PM  Result Value Ref Range   Sodium 136 135 - 145 mmol/L   Potassium 3.8 3.5 - 5.1 mmol/L   Chloride 101 98 - 111 mmol/L   CO2 26 22 - 32 mmol/L   Glucose, Bld 251 (H) 70 - 99 mg/dL    Comment: Glucose reference range applies only to samples taken after fasting for at least 8 hours.   BUN 10 6 - 20 mg/dL   Creatinine, Ser 8.92 0.61 - 1.24 mg/dL   Calcium  8.4 (L) 8.9 - 10.3 mg/dL   GFR, Estimated >39 >39 mL/min    Comment: (NOTE) Calculated using the CKD-EPI Creatinine Equation (2021)    Anion gap 9 5 - 15    Comment: Performed at [facility], [address], [place], [place] [address]  Glucose, capillary     Status: Abnormal   Collection Time: 10/25/24  5:06 PM  Result Value Ref Range   Glucose-Capillary 231 (H) 70 - 99 mg/dL    Comment: Glucose reference range applies only to samples taken after fasting for at least 8 hours.   Comment 1 Notify RN   Glucose, capillary     Status: Abnormal   Collection Time: 10/25/24  8:59 PM  Result Value Ref Range   Glucose-Capillary 346 (H) 70 -  99 mg/dL    Comment: Glucose reference range applies only to samples taken after fasting for at least 8 hours.  Basic metabolic panel     Status: Abnormal   Collection Time: 10/26/24  5:07 AM  Result Value Ref Range   Sodium 135 135 - 145 mmol/L   Potassium 3.7 3.5 - 5.1 mmol/L   Chloride 99 98 - 111 mmol/L   CO2 24 22 - 32 mmol/L   Glucose, Bld 261 (H) 70 - 99 mg/dL    Comment: Glucose reference range applies only to samples taken after fasting for at least 8 hours.   BUN 15 6 - 20 mg/dL   Creatinine, Ser 8.85 0.61 - 1.24 mg/dL   Calcium  8.7 (L) 8.9 - 10.3 mg/dL   GFR, Estimated >39 >39 mL/min    Comment: (NOTE) Calculated using the CKD-EPI Creatinine Equation (2021)    Anion gap 12 5 - 15    Comment: Performed at [facility], [address], [place], [place] [address]  Glucose, capillary     Status: Abnormal   Collection Time: 10/26/24  6:31 AM  Result Value Ref Range   Glucose-Capillary 257 (H) 70 - 99 mg/dL    Comment: Glucose reference range applies only to samples taken after fasting for at least 8 hours.   Comment 1 Notify RN   Glucose, capillary     Status: Abnormal   Collection Time: 10/26/24 12:14 PM  Result Value Ref Range   Glucose-Capillary 345 (H) 70 - 99 mg/dL    Comment: Glucose reference range applies only to samples taken after fasting for at least 8 hours.    DG Swallowing Func-Speech Pathology Result Date: 10/25/2024 Table formatting from the original result was not included. Modified Barium Swallow Study Patient Details Name: Jaxxen Voong MRN: [id number] Date of Birth: 1966-08-26 Today's Date: 10/25/2024 HPI/PMH: HPI: Mr. Signorelli is a 58 yo male presenting 12/11  with two days of worsening L weakness, worsening vision, and slurred speech. No acute abnormality per radiology report, but per neurology note, there is a punctate central hyperintensity at the pontomedullary junction. 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 Previous MBS  in July 2025 revealed oropharyngeal deficits and suspected esophageal component but airway protection was complete. Regular diet and thin liquids were recommended. SLP services throughout that admission otherwise focused on communication/fluency. PMH includes: stroke with residual L weakness and bilateral vision loss, DM, HTN, hypercholesterolemia, COPD, AKI, medication noncompliance, EtOH abuse, polysubstance abuse, unhoused, GERD Clinical Impression: Pt has grossly functional oropharyngeal swallowing, exhibiting aspiration only x1 on initial sip. Suspect some of the coughing during intake at home may be related to more impulsive intake than could be observed during this study. Recommend starting regular solids and thin liquids but reinforced the importance of pacing. Pt attributes aspiration event to being surprised by the taste, but also denied any sensation of aspiration occurring despite cough response (PAS 7, aspirates not cleared by cough). Throughout the remainder of testing, he had good efficiency and safety with only intermittent, transient penetration of thin liquids, which is considered to be normal (PAS 2). Note that pt had occasional, subtle coughing throughout testing that was not related to airway protection. Question if it could be esophageal, given retention and retrograde flow observed during brief scan. Attempted to get pt to take larger volumes at a faster rate given report of frequent coughing at home, but he would not do so given aversion to barium; however, he acknowledges that coughing spells at home are often when he is drinking a lot very quickly. Factors that may increase risk of adverse event in presence of aspiration Noe & Lianne 2021): Factors that may increase risk of adverse event in presence of aspiration Noe & Lianne 2021): Poor general health and/or compromised immunity; Weak cough Recommendations/Plan: Swallowing Evaluation Recommendations Swallowing Evaluation  Recommendations Recommendations: PO diet PO Diet Recommendation: Regular; Thin liquids (Level 0) Liquid Administration via: Cup; Straw Medication Administration: Whole meds with liquid Supervision: Patient able to self-feed; Set-up assistance for safety Swallowing strategies  : Slow rate; Small bites/sips Postural changes: Position pt fully upright for meals; Stay upright 30-60 min after meals Oral care recommendations: Oral care BID (2x/day) Recommended consults: Consider ENT consultation Treatment Plan Treatment Plan Treatment recommendations: Therapy as outlined in treatment plan below Follow-up recommendations: Skilled nursing-short term rehab (<3 hours/day) Functional status assessment: Patient has had a recent decline in their functional status and demonstrates the ability to make significant improvements in function in a reasonable and predictable amount of time. Treatment frequency: Min 2x/week Treatment duration: 2 weeks Interventions: Aspiration precaution training; Compensatory techniques; Patient/family education; Diet toleration management by SLP Recommendations Recommendations for follow up therapy are one component of a multi-disciplinary discharge planning process, led by the attending physician.  Recommendations may be updated based on patient status, additional functional criteria and insurance authorization. Assessment: Orofacial Exam: Orofacial Exam Oral Cavity: Oral Hygiene: WFL Oral Cavity - Dentition: Adequate natural dentition; Missing dentition Orofacial Anatomy: WFL Anatomy: Anatomy: WFL Boluses Administered: Boluses Administered Boluses Administered: Thin liquids (Level 0); Mildly thick liquids (Level 2, nectar thick); Moderately thick liquids (Level 3, honey thick); Puree; Solid  Oral Impairment Domain: Oral Impairment Domain Lip Closure: Interlabial escape, no progression to anterior lip Tongue control during bolus hold: Cohesive bolus between tongue to palatal seal Bolus  preparation/mastication: Timely and efficient chewing and mashing Bolus transport/lingual motion: Brisk tongue motion Oral  residue: Residue collection on oral structures Location of oral residue : Tongue Initiation of pharyngeal swallow : Valleculae  Pharyngeal Impairment Domain: Pharyngeal Impairment Domain Soft palate elevation: No bolus between soft palate (SP)/pharyngeal wall (PW) Laryngeal elevation: Complete superior movement of thyroid  cartilage with complete approximation of arytenoids to epiglottic petiole Anterior hyoid excursion: Complete anterior movement Epiglottic movement: Complete inversion Laryngeal vestibule closure: Complete, no air/contrast in laryngeal vestibule Pharyngeal stripping wave : Present - complete Pharyngeal contraction (A/P view only): N/A Pharyngoesophageal segment opening: Complete distension and complete duration, no obstruction of flow Tongue base retraction: No contrast between tongue base and posterior pharyngeal wall (PPW) Pharyngeal residue: Complete pharyngeal clearance Location of pharyngeal residue: N/A  Esophageal Impairment Domain: Esophageal Impairment Domain Esophageal clearance upright position: Esophageal retention with retrograde flow below pharyngoesophageal segment (PES) Pill: Pill Consistency administered: Thin liquids (Level 0) Thin liquids (Level 0): [facility] Penetration/Aspiration Scale Score: Penetration/Aspiration Scale Score 1.  Material does not enter airway: Mildly thick liquids (Level 2, nectar thick); Moderately thick liquids (Level 3, honey thick); Puree; Solid; Pill 7.  Material enters airway, passes BELOW cords and not ejected out despite cough attempt by patient: Thin liquids (Level 0) Compensatory Strategies: No data recorded  General Information: Caregiver present: No  Diet Prior to this Study: NPO   Temperature : Normal   Respiratory Status: WFL   Supplemental O2: None (Room air)   History of Recent Intubation: No  Behavior/Cognition: Alert;  Cooperative; Pleasant mood Self-Feeding Abilities: Able to self-feed Baseline vocal quality/speech: Dysphonic Volitional Cough: Able to elicit Volitional Swallow: Able to elicit Exam Limitations: No limitations Goal Planning: Prognosis for improved oropharyngeal function: Good No data recorded No data recorded Patient/Family Stated Goal: wants food Consulted and agree with results and recommendations: Patient Pain: Pain Assessment Pain Assessment: Faces Pain Score: 4 Faces Pain Scale: 0 Pain Location: head, back Pain Descriptors / Indicators: Aching Pain Intervention(s): Monitored during session; Repositioned End of Session: Start Time:SLP Start Time (ACUTE ONLY): 1514 Stop Time: SLP Stop Time (ACUTE ONLY): 1529 Time Calculation:SLP Time Calculation (min) (ACUTE ONLY): 15 min Charges: SLP Evaluations $ SLP Speech Visit: 1 Visit SLP Evaluations $BSS Swallow: 1 Procedure $MBS Swallow: 1 Procedure $ SLP EVAL LANGUAGE/SOUND PRODUCTION: 1 Procedure SLP visit diagnosis: SLP Visit Diagnosis: Dysphagia, unspecified (R13.10) Past Medical History: Past Medical History: Diagnosis Date  AKI (acute kidney injury) 07/20/2022  Closed fracture dislocation of lumbar spine (HCC) 1987  COPD (chronic obstructive pulmonary disease) (HCC)   COVID-19 virus infection 07/20/2022  CVA (cerebral vascular accident) (HCC) 03/25/2022  Diabetes mellitus without complication (HCC)   GERD (gastroesophageal reflux disease)   High cholesterol   Homelessness 02/07/2019  Hypertension  Past Surgical History: No past surgical history on file. Leita SAILOR., M.A. CCC-SLP Acute Rehabilitation Services Office: [phone] Secure chat preferred 10/25/2024, 3:57 PM  ECHOCARDIOGRAM COMPLETE Result Date: 10/25/2024    ECHOCARDIOGRAM REPORT   Patient Name:   Stanley Stone Date of Exam: 10/25/2024 Medical Rec #:  [id number]        Height:       65.0 in Accession #:    [id number]       Weight:       180.8 lb Date of Birth:  17-Nov-1965        BSA:          1.895  m Patient Age:    58 years         BP:           152/87 mmHg  Patient Gender: M                HR:           74 bpm. Exam Location:  Inpatient Procedure: 2D Echo, Cardiac Doppler, Color Doppler and Intracardiac            Opacification Agent (Both Spectral and Color Flow Doppler were            utilized during procedure). Indications:    Stroke I63.9  History:        Patient has prior history of Echocardiogram examinations, most                 recent 03/27/2022.  Sonographer:    Tinnie Gosling RDCS Referring Phys: [id number] ROBERT DORRELL IMPRESSIONS  1. Left ventricular ejection fraction, by estimation, is 55 to 60%. Left ventricular ejection fraction by 2D MOD biplane is 60.1 %. The left ventricle has normal function. The left ventricle has no regional wall motion abnormalities. There is moderate concentric left ventricular hypertrophy. Left ventricular diastolic parameters are consistent with Grade I diastolic dysfunction (impaired relaxation).  2. Right ventricular systolic function is normal. The right ventricular size is normal. Tricuspid regurgitation signal is inadequate for assessing PA pressure.  3. Left atrial size was mildly dilated.  4. The mitral valve is normal in structure. No evidence of mitral valve regurgitation. No evidence of mitral stenosis.  5. The aortic valve is tricuspid. Aortic valve regurgitation is mild. No aortic stenosis is present.  6. Aortic dilatation noted. There is moderate dilatation of the ascending aorta, measuring 44 mm.  7. The inferior vena cava is normal in size with greater than 50% respiratory variability, suggesting right atrial pressure of 3 mmHg. FINDINGS  Left Ventricle: Left ventricular ejection fraction, by estimation, is 55 to 60%. Left ventricular ejection fraction by 2D MOD biplane is 60.1 %. The left ventricle has normal function. The left ventricle has no regional wall motion abnormalities. Definity  contrast agent was given IV to delineate the left ventricular  endocardial borders. The left ventricular internal cavity size was normal in size. There is moderate concentric left ventricular hypertrophy. Left ventricular diastolic parameters are  consistent with Grade I diastolic dysfunction (impaired relaxation). Right Ventricle: The right ventricular size is normal. No increase in right ventricular wall thickness. Right ventricular systolic function is normal. Tricuspid regurgitation signal is inadequate for assessing PA pressure. Left Atrium: Left atrial size was mildly dilated. Right Atrium: Right atrial size was normal in size. Pericardium: There is no evidence of pericardial effusion. Mitral Valve: The mitral valve is normal in structure. No evidence of mitral valve regurgitation. No evidence of mitral valve stenosis. Tricuspid Valve: The tricuspid valve is normal in structure. Tricuspid valve regurgitation is not demonstrated. Aortic Valve: The aortic valve is tricuspid. Aortic valve regurgitation is mild. No aortic stenosis is present. Pulmonic Valve: The pulmonic valve was normal in structure. Pulmonic valve regurgitation is not visualized. Aorta: Aortic dilatation noted. There is moderate dilatation of the ascending aorta, measuring 44 mm. Venous: The inferior vena cava is normal in size with greater than 50% respiratory variability, suggesting right atrial pressure of 3 mmHg. IAS/Shunts: No atrial level shunt detected by color flow Doppler.  LEFT VENTRICLE PLAX 2D                        Biplane EF (MOD) LVIDd:         4.30 cm  LV Biplane EF:   Left LVIDs:         2.90 cm                          ventricular LV PW:         1.40 cm                          ejection LV IVS:        1.40 cm                          fraction by LVOT diam:     2.30 cm                          2D MOD LV SV:         75                               biplane is LV SV Index:   39                               60.1 %. LVOT Area:     4.15 cm LV IVRT:       143 msec        Diastology                                 LV e' medial:    4.90 cm/s                                LV E/e' medial:  11.4 LV Volumes (MOD)               LV e' lateral:   8.49 cm/s LV vol d, MOD    103.0 ml      LV E/e' lateral: 6.6 A2C: LV vol d, MOD    128.0 ml A4C: LV vol s, MOD    45.3 ml A2C: LV vol s, MOD    43.5 ml A4C: LV SV MOD A2C:   57.7 ml LV SV MOD A4C:   128.0 ml LV SV MOD BP:    70.1 ml RIGHT VENTRICLE             IVC RV S prime:     12.50 cm/s  IVC diam: 2.00 cm TAPSE (M-mode): 2.4 cm                             PULMONARY VEINS                             Diastolic Velocity: 36.30 cm/s                             S/D Velocity:       1.10                             Systolic Velocity:  41.30 cm/s  LEFT ATRIUM             Index        RIGHT ATRIUM           Index LA diam:        3.50 cm 1.85 cm/m   RA Area:     13.90 cm LA Vol (A2C):   71.6 ml 37.78 ml/m  RA Volume:   31.80 ml  16.78 ml/m LA Vol (A4C):   54.3 ml 28.65 ml/m LA Biplane Vol: 66.2 ml 34.93 ml/m  AORTIC VALVE LVOT Vmax:   81.40 cm/s LVOT Vmean:  62.500 cm/s LVOT VTI:    0.180 m  AORTA Ao Root diam: 4.40 cm Ao Asc diam:  3.70 cm MITRAL VALVE MV Area (PHT): 3.45 cm    SHUNTS MV Decel Time: 220 msec    Systemic VTI:  0.18 m MV E velocity: 56.10 cm/s  Systemic Diam: 2.30 cm MV A velocity: 71.30 cm/s MV E/A ratio:  0.79 Dalton McleanMD Electronically signed by Ezra Kanner Signature Date/Time: 10/25/2024/3:34:26 PM    Final    MR BRAIN WO CONTRAST Result Date: 10/25/2024 EXAM: MRI BRAIN WITHOUT CONTRAST 10/25/2024 01:00:00 AM TECHNIQUE: Multiplanar multisequence MRI of the head/brain was performed without the administration of intravenous contrast. COMPARISON: MRI head 09/22/2022. CLINICAL HISTORY: Neuro deficit, acute, stroke suspected FINDINGS: BRAIN AND VENTRICLES: No acute infarct. Remote left PCA territory infarct. Remote left basal ganglia and left frontal infarcts. No intracranial hemorrhage. No mass. No midline shift. No hydrocephalus. The  sella is unremarkable. Diminuitive left MCA flow void compatible with known left MCA occlusion seen on same day CTA. ORBITS: No acute abnormality. SINUSES AND MASTOIDS: Left maxillary sinus mucosal thickening. BONES AND SOFT TISSUES: Normal marrow signal. Right parotid mass bettter charcterized on same day CTA head/neck. IMPRESSION: 1. No acute intracranial abnormality. 2. Remote left basal ganglia, left frontal and left PCA territory infarcts. 3. Right parotid mass bettter charcterized on same day CTA head/neck. Electronically signed by: Gilmore Molt 10/25/2024 01:34 AM EST RP Workstation: [id number]   CT Head Wo Contrast Result Date: 10/24/2024 EXAM: CT HEAD WITHOUT 10/24/2024 10:16:29 PM TECHNIQUE: CT of the head was performed without the administration of intravenous contrast. Automated exposure control, iterative reconstruction, and/or weight based adjustment of the mA/kV was utilized to reduce the radiation dose to as low as reasonably achievable. COMPARISON: CT code stroke May 25, 2024 CLINICAL HISTORY: Headache, new onset (Age >= 51y) FINDINGS: BRAIN AND VENTRICLES: No acute intracranial hemorrhage. Similar remote left PCA territory, left basal ganglia, and left frontal cortical infarcts. No mass effect or midline shift. No extra-axial fluid collection. No evidence of acute infarct. No hydrocephalus. ORBITS: No acute abnormality. SINUSES AND MASTOIDS: Left maxillary sinus mucosal thickening. No mastoid effusions. SOFT TISSUES AND SKULL: No acute skull fracture. No acute soft tissue abnormality. IMPRESSION: 1. No acute intracranial abnormality. ASPECTS 10. 2. Similar remote left PCA territory, left basal ganglia, and left frontal cortical infarcts. Electronically signed by: Gilmore Molt 10/24/2024 10:58 PM EST RP Workstation: [id number]   CT ANGIO HEAD NECK W WO CM Result Date: 10/24/2024 EXAM: CTA Head and Neck with Intravenous Contrast. CT Head without Contrast. CLINICAL HISTORY: Neuro  deficit, acute, stroke suspected TECHNIQUE: Axial CTA images of the head and neck performed with intravenous contrast. MIP reconstructed images were created and reviewed. Axial computed tomography images of the head/brain performed without intravenous contrast. Note: Per PQRS, the description of internal carotid artery percent stenosis, including 0 percent or normal exam,  is based on [employer] American Symptomatic Carotid Endarterectomy Trial (NASCET) criteria. Dose reduction technique was used including one or more of the following: automated exposure control, adjustment of mA and kV according to patient size, and/or iterative reconstruction. CONTRAST: 75 mL Omnipaque  COMPARISON: CTA head/neck May 25, 2024 FINDINGS: CT HEAD: BRAIN: Similar appearance of remote left PCA territory, left basal ganglia, and left frontal cortical infarcts. No acute intraparenchymal hemorrhage. No mass lesion. No CT evidence for acute territorial infarct. No midline shift or extra-axial collection. VENTRICLES: No hydrocephalus. ORBITS: The orbits are unremarkable. SINUSES AND MASTOIDS: Left maxillary sinus and scattered ethmoid air cells mucosal thickening. No mastoid effusion CTA NECK: COMMON CAROTID ARTERIES: No significant stenosis. No dissection or occlusion. INTERNAL CAROTID ARTERIES: No stenosis by NASCET criteria. No dissection or occlusion. 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 No dissection or occlusion. CTA HEAD: ANTERIOR CEREBRAL ARTERIES: No significant stenosis. No occlusion. No aneurysm. MIDDLE CEREBRAL ARTERIES: Similar left M1 MCA occlusion with more distal reconstitution. No significant stenosis of the right MCA. No aneurysm. POSTERIOR CEREBRAL ARTERIES: No significant stenosis. No occlusion. No aneurysm. BASILAR ARTERY: No significant stenosis. No occlusion. No aneurysm. OTHER: SOFT TISSUES: Similar 2.1 cm mass that appears to be arising from the right parotid mass. Increase in  abnormally heterogeneous, rounded and now 1.1 cm right level II node on series 10, image 104. BONES: No acute osseous abnormality. IMPRESSION: 1. No evidence of acute intracranial abnormality. ASPECTS 10. 2. Similar left M1 MCA occlusion with more distal reconstitution. 3. 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 4. Similar 2.1 cm suspected right parotid mass with interval increase in abnormally heterogeneous, rounded and now 1.1 cm right level II node. 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 Recommend ENT consultation if not already made. Electronically signed by: Gilmore Molt 10/24/2024 10:55 PM EST RP Workstation: [id number]    ROS Blood pressure (!) 138/109, pulse 77, temperature 97.8 F (36.6 C), temperature source Oral, resp. rate 16, height 5' 5 (1.651 m), weight 82 kg, SpO2 95%. Physical Exam Constitutional:      Appearance: Normal appearance.  HENT:     Head: Normocephalic and atraumatic.     Right Ear: Tympanic membrane is without lesions and middle ear aerated, ear canal and external ear normal.     Left Ear: Tympanic membrane is without lesions and middle ear aerated, ear canal and external ear normal.     Nose: Nose without deviation of septum.  Turbinates with mild hypertrophy, No significant swelling or masses.     Oral cavity/oropharynx: Mucous membranes are moist. No lesions or masses    Larynx: normal voice. Mirror attempted without success    Eyes:     Extraocular Movements: Extraocular movements intact.     Conjunctiva/sclera: Conjunctivae normal.     Pupils: Pupils are equal, round, and reactive to light.  Cardiovascular:     Rate and Rhythm: Normal rate.  Pulmonary:     Effort: Pulmonary effort is normal.  Musculoskeletal:     Cervical back: Normal range of motion and neck supple. No rigidity.  Lymphadenopathy:     Cervical: No cervical adenopathy or masses.salivary glands without lesions.  .     Salivary glands-there is a palpable mass in the inferior tail area of the parotid gland on the right.  No other  mass or swelling Neurological:     Mental Status: He is alert. CN 2-12 intact. No nystagmus  Assessment/Plan: Right parotid mass-this is noted on the CT angio.  I think that a workup is needed for this and this can be done as an outpatient.  A fine-needle aspiration would be a consideration but also a parotidectomy will be discussed.  He understands and will get an appointment as his daughter is going to call next week to take care of that.  Norleen Notice 10/26/2024, 12:22 PM        [1]  Allergies Allergen Reactions   Fish Allergy Anaphylaxis, Other (See Comments) and Swelling    Throat swells   Penicillins Anaphylaxis, Swelling and Other (See Comments)    Throat swells   Shellfish Allergy Anaphylaxis, Swelling and Other (See Comments)    Throat swells

## 2024-10-26 NOTE — Progress Notes (Signed)
 Overnight cross coverage  Informed by RN that patient has diabetes but not on insulin  or scheduled CBG checks.  Last CBG yesterday evening was 346.  Hemoglobin A1c was 9.6 on labs yesterday.  I have reviewed note from diabetes coordinator and started Semglee  20 units daily and sensitive sliding scale insulin  ACHS.  Also informed by RN that patient wants to change his CODE STATUS.  I spoke to the patient and discussed CODE STATUS.  Patient has expressed his desire to change his CODE STATUS to DNR/DNI.  CODE STATUS changed per patient request.

## 2024-10-26 NOTE — Progress Notes (Addendum)
 Pt expressed his wants to change his Code Status to DNR as informed by Day Shift RN. Verify it again and talked to pt and he confirmed to change it to DNR, MD made aware.

## 2024-10-27 DIAGNOSIS — K5901 Slow transit constipation: Secondary | ICD-10-CM | POA: Diagnosis not present

## 2024-10-27 DIAGNOSIS — Z794 Long term (current) use of insulin: Secondary | ICD-10-CM | POA: Diagnosis not present

## 2024-10-27 DIAGNOSIS — E7800 Pure hypercholesterolemia, unspecified: Secondary | ICD-10-CM | POA: Diagnosis present

## 2024-10-27 DIAGNOSIS — I10 Essential (primary) hypertension: Secondary | ICD-10-CM | POA: Diagnosis present

## 2024-10-27 DIAGNOSIS — K118 Other diseases of salivary glands: Secondary | ICD-10-CM | POA: Diagnosis not present

## 2024-10-27 DIAGNOSIS — E876 Hypokalemia: Secondary | ICD-10-CM | POA: Diagnosis present

## 2024-10-27 DIAGNOSIS — Y903 Blood alcohol level of 60-79 mg/100 ml: Secondary | ICD-10-CM | POA: Diagnosis present

## 2024-10-27 DIAGNOSIS — E669 Obesity, unspecified: Secondary | ICD-10-CM | POA: Diagnosis present

## 2024-10-27 DIAGNOSIS — I161 Hypertensive emergency: Secondary | ICD-10-CM | POA: Diagnosis present

## 2024-10-27 DIAGNOSIS — G459 Transient cerebral ischemic attack, unspecified: Secondary | ICD-10-CM | POA: Diagnosis present

## 2024-10-27 DIAGNOSIS — I639 Cerebral infarction, unspecified: Secondary | ICD-10-CM | POA: Diagnosis not present

## 2024-10-27 DIAGNOSIS — F17210 Nicotine dependence, cigarettes, uncomplicated: Secondary | ICD-10-CM | POA: Diagnosis present

## 2024-10-27 DIAGNOSIS — Z7985 Long-term (current) use of injectable non-insulin antidiabetic drugs: Secondary | ICD-10-CM | POA: Diagnosis not present

## 2024-10-27 DIAGNOSIS — E119 Type 2 diabetes mellitus without complications: Secondary | ICD-10-CM | POA: Diagnosis not present

## 2024-10-27 DIAGNOSIS — I69354 Hemiplegia and hemiparesis following cerebral infarction affecting left non-dominant side: Secondary | ICD-10-CM | POA: Diagnosis not present

## 2024-10-27 DIAGNOSIS — Z7984 Long term (current) use of oral hypoglycemic drugs: Secondary | ICD-10-CM | POA: Diagnosis not present

## 2024-10-27 DIAGNOSIS — Z7982 Long term (current) use of aspirin: Secondary | ICD-10-CM | POA: Diagnosis not present

## 2024-10-27 DIAGNOSIS — Z91148 Patient's other noncompliance with medication regimen for other reason: Secondary | ICD-10-CM | POA: Diagnosis not present

## 2024-10-27 DIAGNOSIS — N179 Acute kidney failure, unspecified: Secondary | ICD-10-CM | POA: Diagnosis present

## 2024-10-27 DIAGNOSIS — R739 Hyperglycemia, unspecified: Secondary | ICD-10-CM | POA: Diagnosis not present

## 2024-10-27 DIAGNOSIS — F1090 Alcohol use, unspecified, uncomplicated: Secondary | ICD-10-CM | POA: Diagnosis not present

## 2024-10-27 DIAGNOSIS — R471 Dysarthria and anarthria: Secondary | ICD-10-CM | POA: Diagnosis present

## 2024-10-27 DIAGNOSIS — R1310 Dysphagia, unspecified: Secondary | ICD-10-CM | POA: Diagnosis present

## 2024-10-27 DIAGNOSIS — H548 Legal blindness, as defined in USA: Secondary | ICD-10-CM | POA: Diagnosis present

## 2024-10-27 DIAGNOSIS — Z7951 Long term (current) use of inhaled steroids: Secondary | ICD-10-CM | POA: Diagnosis not present

## 2024-10-27 DIAGNOSIS — Z8616 Personal history of COVID-19: Secondary | ICD-10-CM | POA: Diagnosis not present

## 2024-10-27 DIAGNOSIS — I6930 Unspecified sequelae of cerebral infarction: Secondary | ICD-10-CM | POA: Diagnosis not present

## 2024-10-27 DIAGNOSIS — F32A Depression, unspecified: Secondary | ICD-10-CM | POA: Diagnosis present

## 2024-10-27 DIAGNOSIS — Z66 Do not resuscitate: Secondary | ICD-10-CM | POA: Diagnosis not present

## 2024-10-27 DIAGNOSIS — J449 Chronic obstructive pulmonary disease, unspecified: Secondary | ICD-10-CM | POA: Diagnosis present

## 2024-10-27 DIAGNOSIS — K219 Gastro-esophageal reflux disease without esophagitis: Secondary | ICD-10-CM | POA: Diagnosis present

## 2024-10-27 DIAGNOSIS — E1165 Type 2 diabetes mellitus with hyperglycemia: Secondary | ICD-10-CM | POA: Diagnosis present

## 2024-10-27 DIAGNOSIS — E785 Hyperlipidemia, unspecified: Secondary | ICD-10-CM | POA: Diagnosis not present

## 2024-10-27 DIAGNOSIS — R221 Localized swelling, mass and lump, neck: Secondary | ICD-10-CM | POA: Diagnosis present

## 2024-10-27 DIAGNOSIS — Z5900 Homelessness unspecified: Secondary | ICD-10-CM | POA: Diagnosis not present

## 2024-10-27 LAB — GLUCOSE, CAPILLARY
Glucose-Capillary: 260 mg/dL — ABNORMAL HIGH (ref 70–99)
Glucose-Capillary: 408 mg/dL — ABNORMAL HIGH (ref 70–99)
Glucose-Capillary: 360 mg/dL — ABNORMAL HIGH (ref 70–99)
Glucose-Capillary: 363 mg/dL — ABNORMAL HIGH (ref 70–99)
Glucose-Capillary: 189 mg/dL — ABNORMAL HIGH (ref 70–99)

## 2024-10-27 MED ORDER — INSULIN ASPART 100 UNIT/ML IJ SOLN
0.0000 [IU] | Freq: Three times a day (TID) | INTRAMUSCULAR | Status: DC
  Administered 2024-10-27 (×2): 20 [IU] via SUBCUTANEOUS
  Administered 2024-10-28: 12:00:00 11 [IU] via SUBCUTANEOUS
  Administered 2024-10-28 (×2): 4 [IU] via SUBCUTANEOUS
  Administered 2024-10-29: 12:00:00 11 [IU] via SUBCUTANEOUS
  Administered 2024-10-29 (×2): 4 [IU] via SUBCUTANEOUS
  Administered 2024-10-30: 12:00:00 7 [IU] via SUBCUTANEOUS
  Administered 2024-10-30: 08:00:00 3 [IU] via SUBCUTANEOUS
  Filled 2024-10-27 (×2): qty 20
  Filled 2024-10-27: qty 4
  Filled 2024-10-27: qty 7
  Filled 2024-10-27: qty 4
  Filled 2024-10-27 (×2): qty 11
  Filled 2024-10-27: qty 3
  Filled 2024-10-27 (×2): qty 4

## 2024-10-27 MED ORDER — INSULIN ASPART 100 UNIT/ML IJ SOLN
0.0000 [IU] | Freq: Every day | INTRAMUSCULAR | Status: DC
  Administered 2024-10-28: 22:00:00 3 [IU] via SUBCUTANEOUS
  Administered 2024-10-29: 22:00:00 2 [IU] via SUBCUTANEOUS
  Filled 2024-10-27: qty 2
  Filled 2024-10-27: qty 3

## 2024-10-27 MED ORDER — INSULIN GLARGINE 100 UNIT/ML ~~LOC~~ SOLN
30.0000 [IU] | Freq: Every day | SUBCUTANEOUS | Status: DC
  Administered 2024-10-28 – 2024-10-29 (×2): 30 [IU] via SUBCUTANEOUS
  Filled 2024-10-27 (×2): qty 0.3

## 2024-10-27 MED ORDER — INSULIN GLARGINE 100 UNIT/ML ~~LOC~~ SOLN
5.0000 [IU] | Freq: Once | SUBCUTANEOUS | Status: AC
  Administered 2024-10-27: 5 [IU] via SUBCUTANEOUS
  Filled 2024-10-27: qty 0.05

## 2024-10-27 MED ORDER — INSULIN ASPART 100 UNIT/ML IJ SOLN: 0.0000 [IU] | Freq: Three times a day (TID) | INTRAMUSCULAR | Status: DC

## 2024-10-27 MED ORDER — INSULIN ASPART 100 UNIT/ML IJ SOLN
5.0000 [IU] | Freq: Three times a day (TID) | INTRAMUSCULAR | Status: DC
  Administered 2024-10-27 – 2024-10-29 (×6): 5 [IU] via SUBCUTANEOUS
  Filled 2024-10-27 (×6): qty 5

## 2024-10-27 NOTE — Plan of Care (Signed)

## 2024-10-27 NOTE — Progress Notes (Addendum)
 PROGRESS NOTE    Stanley Stone  FMW:[id number] DOB: [birth year] DOA: 10/24/2024 PCP: [facility]  Chief Complaint  Patient presents with   Numbness    Brief Narrative:   58 y.o. male with medical history significant of prior CVA, COPD, diabetes, hypertension, hyperlipidemia who presented emergency department due to slurred speech, vision changes and left-sided weakness.  CTA head and neck showed carotid mass and severe stenosis of vertebral artery. MRI brain showed no acute infarcts. Neurology was consulted and patient was admitted further workup   Assessment & Plan:   Principal Problem:   Hypertensive emergency Active Problems:   TIA (transient ischemic attack)  # Slurred speech and weakness most likely secondary to recrudescence of prior stroke due to hypertensive emergency - MRI negative for acute stroke -remote L basal ganglia, L frontal and L PCA territory infarcts -- CTA head/neck with severe L M1 MCA occlusion with more distal reconstitution, similar severe stenosis of the proximal L V2 vertebral artery - Neurology consulted - asked to reevaluate today due to persistence of L sided weakness.  Suspected recrudescence of old stroke sx due to HA and hypertensive emergency.  Continue statin, aspirin .    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   ENT consulted, Dr. Salina saw the patient.  Plan for workup of parotid mass as outpatient.   # Dysphagia -SLP recommending regular, thin liquids   # Hypertension -continue amlodipine , carvedilol , irbesartan , hydrochlorothiazide    # GERD -continue Protonix    # Depression -continue Zoloft    # Diabetes mellitus type 2 - A1c 9.6 - BG's uncontrolled - adjust insulin  - basal, bolus, ssi - carb mod diet   # Legally Blind  - noted     DVT  prophylaxis: lovenox  Code Status: DNR Family Communication: none Disposition:   Status is: Inpatient Remains inpatient appropriate because: need for continued inpatient care, SNF placement    Consultants:  neurology  Procedures:  Echo IMPRESSIONS     1. Left ventricular ejection fraction, by estimation, is 55 to 60%. Left  ventricular ejection fraction by 2D MOD biplane is 60.1 %. The left  ventricle has normal function. The left ventricle has no regional wall  motion abnormalities. There is moderate  concentric left ventricular hypertrophy. Left ventricular diastolic  parameters are consistent with Grade I diastolic dysfunction (impaired  relaxation).   2. Right ventricular systolic function is normal. The right ventricular  size is normal. Tricuspid regurgitation signal is inadequate for assessing  PA pressure.   3. Left atrial size was mildly dilated.   4. The mitral valve is normal in structure. No evidence of mitral valve  regurgitation. No evidence of mitral stenosis.   5. The aortic valve is tricuspid. Aortic valve regurgitation is mild. No  aortic stenosis is present.   6. Aortic dilatation noted. There is moderate dilatation of the ascending  aorta, measuring 44 mm.   7. The inferior vena cava is normal in size with greater than 50%  respiratory variability, suggesting right atrial pressure of 3 mmHg.   Antimicrobials:  Anti-infectives (From admission, onward)    None       Subjective: No new complaints  Objective: Vitals:   10/27/24 0409 10/27/24 0801 10/27/24 1211 10/27/24 1620  BP: 129/88 (!) 143/89 126/79 113/74  Pulse: 71 80 72 75  Resp: 16 18 18  18  Temp: 97.6 F (36.4 C) 98.1 F (36.7 C) 98.1 F (36.7 C) 98.2 F (36.8 C)  TempSrc: Oral Oral Oral Oral  SpO2: 94% 92% 95% 94%  Weight:      Height:        Intake/Output Summary (Last 24 hours) at 10/27/2024 1817 Last data filed at 10/27/2024 0000 Gross per 24 hour  Intake --  Output 1300  ml  Net -1300 ml   Filed Weights   10/24/24 2137  Weight: 82 kg    Examination:  General exam: Appears calm and comfortable  Respiratory system: unlabored Cardiovascular system: RRR Gastrointestinal system: Abdomen is nondistended, soft and nontender.  Central nervous system: L sided weakness Extremities: no LEE    Data Reviewed: I have personally reviewed following labs and imaging studies  CBC: Recent Labs  Lab 10/24/24 2148 10/24/24 2158 10/25/24 0735  WBC 8.4  --  7.7  NEUTROABS 5.4  --   --   HGB 15.8 16.7 14.6  HCT 46.5 49.0 43.1  MCV 89.6  --  89.4  PLT 208  --  176    Basic Metabolic Panel: Recent Labs  Lab 10/24/24 2148 10/24/24 2158 10/25/24 0313 10/25/24 0735 10/25/24 1210 10/26/24 0507  NA 134* 135 136 140 136 135  K 3.7 3.6 3.6 3.4* 3.8 3.7  CL 97* 98 102 105 101 99  CO2 21*  --  21* 27 26 24   GLUCOSE 467* 486* 231* 120* 251* 261*  BUN 15 19 13 12 10 15   CREATININE 1.14 1.20 0.99 1.11 1.07 1.14  CALCIUM  9.3  --  8.6* 8.7* 8.4* 8.7*    GFR: Estimated Creatinine Clearance: 68.8 mL/min (by C-G formula based on SCr of 1.14 mg/dL).  Liver Function Tests: Recent Labs  Lab 10/24/24 2148  AST 17  ALT 23  ALKPHOS 82  BILITOT 0.7  PROT 7.2  ALBUMIN 3.7    CBG: Recent Labs  Lab 10/26/24 2206 10/27/24 0632 10/27/24 1209 10/27/24 1313 10/27/24 1618  GLUCAP 314* 260* 408* 360* 363*     No results found for this or any previous visit (from the past 240 hours).       Radiology Studies: No results found.      Scheduled Meds:  amLODipine   10 mg Oral Daily   aspirin   325 mg Oral Daily   atorvastatin   80 mg Oral QPM   carvedilol   25 mg Oral BID WC   enoxaparin  (LOVENOX ) injection  40 mg Subcutaneous Daily   irbesartan   300 mg Oral Daily   And   hydrochlorothiazide   25 mg Oral Daily   insulin  aspart  0-20 Units Subcutaneous TID WC   insulin  aspart  0-5 Units Subcutaneous QHS   insulin  aspart  5 Units Subcutaneous TID WC    insulin  glargine  20 Units Subcutaneous Daily   nicotine   21 mg Transdermal Daily   pantoprazole   40 mg Oral Daily   sertraline   50 mg Oral Daily   Continuous Infusions:   LOS: 0 days    Time spent: over 30 min     Meliton Monte, MD [facility]   To contact the attending provider between 7A-7P or the covering provider during after hours 7P-7A, please log into the web site [email or web address] and access using universal [place] password for that web site. If you do not have the password, please call the hospital operator.  10/27/2024, 6:17 PM

## 2024-10-27 NOTE — TOC Initial Note (Addendum)
 Transition of Care [facility]) - Initial/Assessment Note    Patient Details  Name: Stanley Stone MRN: [id number] Date of Birth: 1966/01/23  [facility] of [facility]) CM/SW Contact:    Stanley Stone, LCSWA Phone Number: 10/27/2024, 12:38 PM  Clinical Narrative:                  CSW received consult for possible SNF placement at time of discharge. Due to patients current orientation CSW LVM for patients daughter Stanley Stone. CSW awaiting call back to discuss  PT recommendation of SNF placement at time of discharge.  CSW to continue to follow and assist with discharge planning needs.    Update- CSW received call back from patients daughter Stanley Stone. CSW spoke with patients daughter regarding PT recommendation of SNF placement for patient at time of discharge.Patients daughter reports PTA patient was staying at a hotel. Patients daughter expressed understanding of PT recommendation and is agreeable to SNF placement for patient at time of discharge. Patients daughter reports that after rehab patient can stay with her.Patients daughter gave CSW permission to fax out initial referral for SNF placement.CSW discussed insurance authorization process. Patients daughter informed CSW that if CSW trys to call and she does not answer, to please leave a voicemail and she will call back when able. All questions answered. No further questions reported at this time. CSW to continue to follow and assist with discharge planning needs.   Update- Patients passr pending. CSW awaiting MD to cosign patients FL2 and 30 day note, then will submit requested clinicals to [place] must for review.        Patient Goals and CMS Choice            Expected Discharge Plan and Services                                              Prior Living Arrangements/Services                       Activities of Daily Living      Permission Sought/Granted                  Emotional Assessment               Admission diagnosis:  TIA (transient ischemic attack) [G45.9] Primary hypertension [I10] Hyperglycemia [R73.9] Acute left hemiparesis (HCC) [G81.94] Mass of right parotid gland [K11.8] Patient Active Problem List   Diagnosis Date Noted   Hypertensive emergency 10/25/2024   Vision loss, bilateral 08/01/2023   Dental caries 02/28/2023   Hyperlipidemia associated with type 2 diabetes mellitus (HCC) 06/27/2022   History of ischemic right MCA stroke with residual deficit 03/25/2022   Anxiety state 02/18/2022   Onychomycosis 01/18/2022   Chronic bronchitis, unspecified chronic bronchitis type (HCC) 01/18/2022   DM (diabetes mellitus), type 2 (HCC) 02/07/2019   HTN (hypertension) 02/07/2019   History of alcohol abuse 02/07/2019   Tobacco use 02/07/2019   PCP:  [facility] Pharmacy:   [facility] - [facility] [address], [address] [place] [address] Phone: [phone] Fax: [phone]  [id number] - [place], [place] - [address] [place] [address] Phone: [phone] Fax: [phone]  [id number] - [place],  [place] - [address] [place] [address] Phone: [phone] Fax: [phone]     Social Drivers of Health (SDOH) Social History: SDOH Screenings   Food Insecurity: Food Insecurity Present (10/25/2024)  Housing: High Risk (10/25/2024)  Transportation Needs: Unmet Transportation Needs (10/25/2024)  Utilities: At Risk (10/25/2024)  Depression (PHQ2-9): Low Risk (02/28/2023)  Financial Resource Strain: Not on File (09/12/2023)   Received from [facility]  Physical Activity: Not on File (09/12/2023)   Received from [facility]  Social Connections: Not on File (09/12/2023)   Received from [facility]  Stress: Not on File (09/12/2023)   Received from [facility]  Tobacco Use: High Risk (10/24/2024)    SDOH Interventions:     Readmission Risk Interventions     No data to display

## 2024-10-27 NOTE — Plan of Care (Signed)
°  Problem: Ischemic Stroke/TIA Tissue Perfusion: Goal: Complications of ischemic stroke/TIA will be minimized Outcome: Progressing   Problem: Health Behavior/Discharge Planning: Goal: Ability to manage health-related needs will improve Outcome: Progressing   Problem: Self-Care: Goal: Ability to communicate needs accurately will improve Outcome: Progressing   Problem: Activity: Goal: Risk for activity intolerance will decrease Outcome: Progressing   Problem: Nutrition: Goal: Adequate nutrition will be maintained Outcome: Progressing   Problem: Coping: Goal: Level of anxiety will decrease Outcome: Progressing   Problem: Elimination: Goal: Will not experience complications related to urinary retention Outcome: Progressing   Problem: Safety: Goal: Ability to remain free from injury will improve Outcome: Progressing   Problem: Skin Integrity: Goal: Risk for impaired skin integrity will decrease Outcome: Progressing

## 2024-10-27 NOTE — [place] FL2 (Signed)
 [place]  MEDICAID FL2 LEVEL OF CARE FORM     IDENTIFICATION  Patient Name: Stanley Stone Birthdate: 04/18/1966 Sex: male Admission Date (Current Location): 10/24/2024  [facility] [place] Number:  Producer, Television/film/video and Address:  [facility] [place], [address] [place], [place] [address]      Provider Number: [id number]  Attending Physician Name and Address:  Perri DELENA Meliton Mickey., *  Relative Name and Phone Number:  Corney Knighton (daughter) [phone]    Current Level of Care: Hospital Recommended Level of Care: Skilled Nursing Facility Prior Approval Number:    Date Approved/Denied:   PASRR Number: PASRR under review  Discharge Plan: SNF    Current Diagnoses: Patient Active Problem List   Diagnosis Date Noted   Hypertensive emergency 10/25/2024   Vision loss, bilateral 08/01/2023   Dental caries 02/28/2023   Hyperlipidemia associated with type 2 diabetes mellitus (HCC) 06/27/2022   History of ischemic right MCA stroke with residual deficit 03/25/2022   Anxiety state 02/18/2022   Onychomycosis 01/18/2022   Chronic bronchitis, unspecified chronic bronchitis type (HCC) 01/18/2022   DM (diabetes mellitus), type 2 (HCC) 02/07/2019   HTN (hypertension) 02/07/2019   History of alcohol abuse 02/07/2019   Tobacco use 02/07/2019    Orientation RESPIRATION BLADDER Height & Weight     Self, Place, Time  Normal Continent Weight: 180 lb 12.4 oz (82 kg) Height:  5' 5 (165.1 cm)  BEHAVIORAL SYMPTOMS/MOOD NEUROLOGICAL BOWEL NUTRITION STATUS        Diet (Please see discharge summary)  AMBULATORY STATUS COMMUNICATION OF NEEDS Skin   Extensive Assist Verbally (difficulty speaking) Other (Comment) (WDL, Wound/Incision LDAs)                       Personal Care Assistance Level of Assistance  Bathing, Feeding, Dressing Bathing Assistance: Maximum assistance Feeding assistance: Limited assistance Dressing Assistance: Maximum assistance      Functional Limitations Info  Sight, Hearing, Speech Sight Info: Impaired (vision loss bilateral) Hearing Info: Adequate Speech Info:  (difficulty speaking)    SPECIAL CARE FACTORS FREQUENCY  PT (By licensed PT), OT (By licensed OT), Speech therapy     PT Frequency: 5x min weekly OT Frequency: 5x min weekly     Speech Therapy Frequency: 1-2x min weekly      Contractures Contractures Info: Not present    Additional Factors Info  Code Status, Allergies, Insulin  Sliding Scale, Psychotropic Code Status Info: DNR Allergies Info: Fish Allergy,Penicillins,Shellfish Allergy Psychotropic Info: sertraline  (ZOLOFT ) tablet 50 mg daily Insulin  Sliding Scale Info: insulin  aspart (novoLOG ) injection 0-20 Units 3 times daily with meals,  insulin  aspart (novoLOG ) injection 0-5 Units daily at bedtime,insulin  aspart (novoLOG ) injection 5 Units 3 times daily with meals,  insulin  glargine (LANTUS ) injection 20 Units daily       Current Medications (10/27/2024):  This is the current hospital active medication list Current Facility-Administered Medications  Medication Dose Route Frequency Provider Last Rate Last Admin   acetaminophen  (TYLENOL ) tablet 650 mg  650 mg Oral Q6H PRN Dorrell, Robert, MD       Or   acetaminophen  (TYLENOL ) suppository 650 mg  650 mg Rectal Q6H PRN Dorrell, Robert, MD       amLODipine  (NORVASC ) tablet 10 mg  10 mg Oral Daily Dena Charleston, MD   10 mg at 10/27/24 1055   aspirin  tablet 325 mg  325 mg Oral Daily Dena Charleston, MD   325 mg at 10/27/24 1055  atorvastatin  (LIPITOR ) tablet 80 mg  80 mg Oral QPM Dena Charleston, MD   80 mg at 10/26/24 1829   carvedilol  (COREG ) tablet 25 mg  25 mg Oral BID WC Dena Charleston, MD   25 mg at 10/27/24 9150   enoxaparin  (LOVENOX ) injection 40 mg  40 mg Subcutaneous Daily Dorrell, Charleston, MD   40 mg at 10/27/24 1057   irbesartan  (AVAPRO ) tablet 300 mg  300 mg Oral Daily Dorrell, Robert, MD   300 mg at 10/27/24 1055   And    hydrochlorothiazide  (HYDRODIURIL ) tablet 25 mg  25 mg Oral Daily Dorrell, Robert, MD   25 mg at 10/27/24 1055   insulin  aspart (novoLOG ) injection 0-20 Units  0-20 Units Subcutaneous TID WC Perri DELENA Meliton Mickey., MD   20 Units at 10/27/24 1328   insulin  aspart (novoLOG ) injection 0-5 Units  0-5 Units Subcutaneous QHS Perri DELENA Meliton Mickey., MD       insulin  aspart (novoLOG ) injection 5 Units  5 Units Subcutaneous TID WC Perri DELENA Meliton Mickey., MD       insulin  glargine (LANTUS ) injection 20 Units  20 Units Subcutaneous Daily Alfornia Madison, MD   20 Units at 10/27/24 1056   nicotine  (NICODERM CQ  - dosed in mg/24 hours) patch 21 mg  21 mg Transdermal Daily Lama, Gagan S, MD   21 mg at 10/27/24 1057   ondansetron  (ZOFRAN ) tablet 4 mg  4 mg Oral Q6H PRN Dena Charleston, MD       Or   ondansetron  (ZOFRAN ) injection 4 mg  4 mg Intravenous Q6H PRN Dorrell, Robert, MD       pantoprazole  (PROTONIX ) EC tablet 40 mg  40 mg Oral Daily Dorrell, Charleston, MD   40 mg at 10/27/24 1055   sertraline  (ZOLOFT ) tablet 50 mg  50 mg Oral Daily Dorrell, Robert, MD   50 mg at 10/27/24 1055     Discharge Medications: Please see discharge summary for a list of discharge medications.  Relevant Imaging Results:  Relevant Lab Results:   Additional Information [id number]  Isaiah Public, LCSWA

## 2024-10-27 NOTE — Progress Notes (Signed)
 He STROKE TEAM PROGRESS NOTE   SUBJECTIVE (INTERVAL HISTORY) Stroke team was asked to see the patient by Dr. Meliton as patient still has persistent increased left leg weakness. Pt reports that he is extremely visually impaired in both eyes.  Vital signs are stable.  No new complaints  OBJECTIVE Temp:  [97.6 F (36.4 C)-98.2 F (36.8 C)] 98.1 F (36.7 C) (12/14 1211) Pulse Rate:  [69-80] 72 (12/14 1211) Resp:  [16-18] 18 (12/14 1211) BP: (104-156)/(74-89) 126/79 (12/14 1211) SpO2:  [92 %-97 %] 95 % (12/14 1211)  Recent Labs  Lab 10/26/24 1214 10/26/24 1612 10/26/24 2206 10/27/24 0632 10/27/24 1209  GLUCAP 345* 242* 314* 260* 408*   Recent Labs  Lab 10/24/24 2148 10/24/24 2158 10/25/24 0313 10/25/24 0735 10/25/24 1210 10/26/24 0507  NA 134* 135 136 140 136 135  K 3.7 3.6 3.6 3.4* 3.8 3.7  CL 97* 98 102 105 101 99  CO2 21*  --  21* 27 26 24   GLUCOSE 467* 486* 231* 120* 251* 261*  BUN 15 19 13 12 10 15   CREATININE 1.14 1.20 0.99 1.11 1.07 1.14  CALCIUM  9.3  --  8.6* 8.7* 8.4* 8.7*   Recent Labs  Lab 10/24/24 2148  AST 17  ALT 23  ALKPHOS 82  BILITOT 0.7  PROT 7.2  ALBUMIN 3.7   Recent Labs  Lab 10/24/24 2148 10/24/24 2158 10/25/24 0735  WBC 8.4  --  7.7  NEUTROABS 5.4  --   --   HGB 15.8 16.7 14.6  HCT 46.5 49.0 43.1  MCV 89.6  --  89.4  PLT 208  --  176   No results for input(s): CKTOTAL, CKMB, CKMBINDEX, TROPONINI in the last 168 hours. Recent Labs    10/24/24 2148  LABPROT 12.2  INR 0.9   Recent Labs    10/25/24 0257  COLORURINE STRAW*  LABSPEC 1.021  PHURINE 6.0  GLUCOSEU >=500*  HGBUR SMALL*  BILIRUBINUR NEGATIVE  KETONESUR NEGATIVE  PROTEINUR 30*  NITRITE NEGATIVE  LEUKOCYTESUR NEGATIVE       Component Value Date/Time   CHOL 195 10/25/2024 0735   CHOL 233 (H) 10/27/2022 1018   TRIG 142 10/25/2024 0735   HDL 38 (L) 10/25/2024 0735   HDL 54 10/27/2022 1018   CHOLHDL 5.1 10/25/2024 0735   VLDL 28 10/25/2024 0735    LDLCALC 129 (H) 10/25/2024 0735   LDLCALC 150 (H) 10/27/2022 1018   Lab Results  Component Value Date   HGBA1C 9.6 (H) 10/25/2024      Component Value Date/Time   LABOPIA NONE DETECTED 10/24/2024 2120   COCAINSCRNUR NONE DETECTED 10/24/2024 2120   LABBENZ NONE DETECTED 10/24/2024 2120   AMPHETMU NONE DETECTED 10/24/2024 2120   THCU NONE DETECTED 10/24/2024 2120   LABBARB NONE DETECTED 10/24/2024 2120    Recent Labs  Lab 10/24/24 2148  ETH 66*    I have personally reviewed the radiological images below and agree with the radiology interpretations.  DG Swallowing Func-Speech Pathology Result Date: 10/25/2024 Table formatting from the original result was not included. Modified Barium Swallow Study Patient Details Name: Stanley Stone MRN: [id number] Date of Birth: 1965-11-26 Today's Date: 10/25/2024 HPI/PMH: HPI: [name] is a 58 yo male presenting 12/11 with two days of worsening L weakness, worsening vision, and slurred speech. No acute abnormality per radiology report, but per neurology note, there is a punctate central hyperintensity at the pontomedullary junction. 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 Previous MBS in July  2025 revealed oropharyngeal deficits and suspected esophageal component but airway protection was complete. Regular diet and thin liquids were recommended. SLP services throughout that admission otherwise focused on communication/fluency. PMH includes: stroke with residual L weakness and bilateral vision loss, DM, HTN, hypercholesterolemia, COPD, AKI, medication noncompliance, EtOH abuse, polysubstance abuse, unhoused, GERD Clinical Impression: Pt has grossly functional oropharyngeal swallowing, exhibiting aspiration only x1 on initial sip. Suspect some of the coughing during intake at home may be related to more impulsive intake than could be observed during this study. Recommend starting regular solids and thin liquids but reinforced the  importance of pacing. Pt attributes aspiration event to being surprised by the taste, but also denied any sensation of aspiration occurring despite cough response (PAS 7, aspirates not cleared by cough). Throughout the remainder of testing, he had good efficiency and safety with only intermittent, transient penetration of thin liquids, which is considered to be normal (PAS 2). Note that pt had occasional, subtle coughing throughout testing that was not related to airway protection. Question if it could be esophageal, given retention and retrograde flow observed during brief scan. Attempted to get pt to take larger volumes at a faster rate given report of frequent coughing at home, but he would not do so given aversion to barium; however, he acknowledges that coughing spells at home are often when he is drinking a lot very quickly. Factors that may increase risk of adverse event in presence of aspiration Noe & Lianne 2021): Factors that may increase risk of adverse event in presence of aspiration Noe & Lianne 2021): Poor general health and/or compromised immunity; Weak cough Recommendations/Plan: Swallowing Evaluation Recommendations Swallowing Evaluation Recommendations Recommendations: PO diet PO Diet Recommendation: Regular; Thin liquids (Level 0) Liquid Administration via: Cup; Straw Medication Administration: Whole meds with liquid Supervision: Patient able to self-feed; Set-up assistance for safety Swallowing strategies  : Slow rate; Small bites/sips Postural changes: Position pt fully upright for meals; Stay upright 30-60 min after meals Oral care recommendations: Oral care BID (2x/day) Recommended consults: Consider ENT consultation Treatment Plan Treatment Plan Treatment recommendations: Therapy as outlined in treatment plan below Follow-up recommendations: Skilled nursing-short term rehab (<3 hours/day) Functional status assessment: Patient has had a recent decline in their functional status and  demonstrates the ability to make significant improvements in function in a reasonable and predictable amount of time. Treatment frequency: Min 2x/week Treatment duration: 2 weeks Interventions: Aspiration precaution training; Compensatory techniques; Patient/family education; Diet toleration management by SLP Recommendations Recommendations for follow up therapy are one component of a multi-disciplinary discharge planning process, led by the attending physician.  Recommendations may be updated based on patient status, additional functional criteria and insurance authorization. Assessment: Orofacial Exam: Orofacial Exam Oral Cavity: Oral Hygiene: WFL Oral Cavity - Dentition: Adequate natural dentition; Missing dentition Orofacial Anatomy: WFL Anatomy: Anatomy: WFL Boluses Administered: Boluses Administered Boluses Administered: Thin liquids (Level 0); Mildly thick liquids (Level 2, nectar thick); Moderately thick liquids (Level 3, honey thick); Puree; Solid  Oral Impairment Domain: Oral Impairment Domain Lip Closure: Interlabial escape, no progression to anterior lip Tongue control during bolus hold: Cohesive bolus between tongue to palatal seal Bolus preparation/mastication: Timely and efficient chewing and mashing Bolus transport/lingual motion: Brisk tongue motion Oral residue: Residue collection on oral structures Location of oral residue : Tongue Initiation of pharyngeal swallow : Valleculae  Pharyngeal Impairment Domain: Pharyngeal Impairment Domain Soft palate elevation: No bolus between soft palate (SP)/pharyngeal wall (PW) Laryngeal elevation: Complete superior movement of thyroid  cartilage with complete approximation  of arytenoids to epiglottic petiole Anterior hyoid excursion: Complete anterior movement Epiglottic movement: Complete inversion Laryngeal vestibule closure: Complete, no air/contrast in laryngeal vestibule Pharyngeal stripping wave : Present - complete Pharyngeal contraction (A/P view only):  N/A Pharyngoesophageal segment opening: Complete distension and complete duration, no obstruction of flow Tongue base retraction: No contrast between tongue base and posterior pharyngeal wall (PPW) Pharyngeal residue: Complete pharyngeal clearance Location of pharyngeal residue: N/A  Esophageal Impairment Domain: Esophageal Impairment Domain Esophageal clearance upright position: Esophageal retention with retrograde flow below pharyngoesophageal segment (PES) Pill: Pill Consistency administered: Thin liquids (Level 0) Thin liquids (Level 0): [facility] Penetration/Aspiration Scale Score: Penetration/Aspiration Scale Score 1.  Material does not enter airway: Mildly thick liquids (Level 2, nectar thick); Moderately thick liquids (Level 3, honey thick); Puree; Solid; Pill 7.  Material enters airway, passes BELOW cords and not ejected out despite cough attempt by patient: Thin liquids (Level 0) Compensatory Strategies: No data recorded  General Information: Caregiver present: No  Diet Prior to this Study: NPO   Temperature : Normal   Respiratory Status: WFL   Supplemental O2: None (Room air)   History of Recent Intubation: No  Behavior/Cognition: Alert; Cooperative; Pleasant mood Self-Feeding Abilities: Able to self-feed Baseline vocal quality/speech: Dysphonic Volitional Cough: Able to elicit Volitional Swallow: Able to elicit Exam Limitations: No limitations Goal Planning: Prognosis for improved oropharyngeal function: Good No data recorded No data recorded Patient/Family Stated Goal: wants food Consulted and agree with results and recommendations: Patient Pain: Pain Assessment Pain Assessment: Faces Pain Score: 4 Faces Pain Scale: 0 Pain Location: head, back Pain Descriptors / Indicators: Aching Pain Intervention(s): Monitored during session; Repositioned End of Session: Start Time:SLP Start Time (ACUTE ONLY): 1514 Stop Time: SLP Stop Time (ACUTE ONLY): 1529 Time Calculation:SLP Time Calculation (min) (ACUTE ONLY): 15 min  Charges: SLP Evaluations $ SLP Speech Visit: 1 Visit SLP Evaluations $BSS Swallow: 1 Procedure $MBS Swallow: 1 Procedure $ SLP EVAL LANGUAGE/SOUND PRODUCTION: 1 Procedure SLP visit diagnosis: SLP Visit Diagnosis: Dysphagia, unspecified (R13.10) Past Medical History: Past Medical History: Diagnosis Date  AKI (acute kidney injury) 07/20/2022  Closed fracture dislocation of lumbar spine (HCC) 1987  COPD (chronic obstructive pulmonary disease) (HCC)   COVID-19 virus infection 07/20/2022  CVA (cerebral vascular accident) (HCC) 03/25/2022  Diabetes mellitus without complication (HCC)   GERD (gastroesophageal reflux disease)   High cholesterol   Homelessness 02/07/2019  Hypertension  Past Surgical History: No past surgical history on file. Leita SAILOR., M.A. CCC-SLP Acute Rehabilitation Services Office: [phone] Secure chat preferred 10/25/2024, 3:57 PM  ECHOCARDIOGRAM COMPLETE Result Date: 10/25/2024    ECHOCARDIOGRAM REPORT   Patient Name:   JUN OSMENT Date of Exam: 10/25/2024 Medical Rec #:  [id number]        Height:       65.0 in Accession #:    [id number]       Weight:       180.8 lb Date of Birth:  1966/08/18        BSA:          1.895 m Patient Age:    58 years         BP:           152/87 mmHg Patient Gender: M                HR:           74 bpm. Exam Location:  Inpatient Procedure: 2D Echo, Cardiac Doppler, Color Doppler and Intracardiac  Opacification Agent (Both Spectral and Color Flow Doppler were            utilized during procedure). Indications:    Stroke I63.9  History:        Patient has prior history of Echocardiogram examinations, most                 recent 03/27/2022.  Sonographer:    Tinnie Gosling RDCS Referring Phys: [id number] ROBERT DORRELL IMPRESSIONS  1. Left ventricular ejection fraction, by estimation, is 55 to 60%. Left ventricular ejection fraction by 2D MOD biplane is 60.1 %. The left ventricle has normal function. The left ventricle has no regional wall motion  abnormalities. There is moderate concentric left ventricular hypertrophy. Left ventricular diastolic parameters are consistent with Grade I diastolic dysfunction (impaired relaxation).  2. Right ventricular systolic function is normal. The right ventricular size is normal. Tricuspid regurgitation signal is inadequate for assessing PA pressure.  3. Left atrial size was mildly dilated.  4. The mitral valve is normal in structure. No evidence of mitral valve regurgitation. No evidence of mitral stenosis.  5. The aortic valve is tricuspid. Aortic valve regurgitation is mild. No aortic stenosis is present.  6. Aortic dilatation noted. There is moderate dilatation of the ascending aorta, measuring 44 mm.  7. The inferior vena cava is normal in size with greater than 50% respiratory variability, suggesting right atrial pressure of 3 mmHg. FINDINGS  Left Ventricle: Left ventricular ejection fraction, by estimation, is 55 to 60%. Left ventricular ejection fraction by 2D MOD biplane is 60.1 %. The left ventricle has normal function. The left ventricle has no regional wall motion abnormalities. Definity  contrast agent was given IV to delineate the left ventricular endocardial borders. The left ventricular internal cavity size was normal in size. There is moderate concentric left ventricular hypertrophy. Left ventricular diastolic parameters are  consistent with Grade I diastolic dysfunction (impaired relaxation). Right Ventricle: The right ventricular size is normal. No increase in right ventricular wall thickness. Right ventricular systolic function is normal. Tricuspid regurgitation signal is inadequate for assessing PA pressure. Left Atrium: Left atrial size was mildly dilated. Right Atrium: Right atrial size was normal in size. Pericardium: There is no evidence of pericardial effusion. Mitral Valve: The mitral valve is normal in structure. No evidence of mitral valve regurgitation. No evidence of mitral valve stenosis.  Tricuspid Valve: The tricuspid valve is normal in structure. Tricuspid valve regurgitation is not demonstrated. Aortic Valve: The aortic valve is tricuspid. Aortic valve regurgitation is mild. No aortic stenosis is present. Pulmonic Valve: The pulmonic valve was normal in structure. Pulmonic valve regurgitation is not visualized. Aorta: Aortic dilatation noted. There is moderate dilatation of the ascending aorta, measuring 44 mm. Venous: The inferior vena cava is normal in size with greater than 50% respiratory variability, suggesting right atrial pressure of 3 mmHg. IAS/Shunts: No atrial level shunt detected by color flow Doppler.  LEFT VENTRICLE PLAX 2D                        Biplane EF (MOD) LVIDd:         4.30 cm         LV Biplane EF:   Left LVIDs:         2.90 cm                          ventricular LV PW:  1.40 cm                          ejection LV IVS:        1.40 cm                          fraction by LVOT diam:     2.30 cm                          2D MOD LV SV:         75                               biplane is LV SV Index:   39                               60.1 %. LVOT Area:     4.15 cm LV IVRT:       143 msec        Diastology                                LV e' medial:    4.90 cm/s                                LV E/e' medial:  11.4 LV Volumes (MOD)               LV e' lateral:   8.49 cm/s LV vol d, MOD    103.0 ml      LV E/e' lateral: 6.6 A2C: LV vol d, MOD    128.0 ml A4C: LV vol s, MOD    45.3 ml A2C: LV vol s, MOD    43.5 ml A4C: LV SV MOD A2C:   57.7 ml LV SV MOD A4C:   128.0 ml LV SV MOD BP:    70.1 ml RIGHT VENTRICLE             IVC RV S prime:     12.50 cm/s  IVC diam: 2.00 cm TAPSE (M-mode): 2.4 cm                             PULMONARY VEINS                             Diastolic Velocity: 36.30 cm/s                             S/D Velocity:       1.10                             Systolic Velocity:  41.30 cm/s LEFT ATRIUM             Index        RIGHT ATRIUM           Index LA  diam:        3.50 cm 1.85 cm/m   RA Area:  13.90 cm LA Vol (A2C):   71.6 ml 37.78 ml/m  RA Volume:   31.80 ml  16.78 ml/m LA Vol (A4C):   54.3 ml 28.65 ml/m LA Biplane Vol: 66.2 ml 34.93 ml/m  AORTIC VALVE LVOT Vmax:   81.40 cm/s LVOT Vmean:  62.500 cm/s LVOT VTI:    0.180 m  AORTA Ao Root diam: 4.40 cm Ao Asc diam:  3.70 cm MITRAL VALVE MV Area (PHT): 3.45 cm    SHUNTS MV Decel Time: 220 msec    Systemic VTI:  0.18 m MV E velocity: 56.10 cm/s  Systemic Diam: 2.30 cm MV A velocity: 71.30 cm/s MV E/A ratio:  0.79 Dalton McleanMD Electronically signed by Ezra Kanner Signature Date/Time: 10/25/2024/3:34:26 PM    Final    MR BRAIN WO CONTRAST Result Date: 10/25/2024 EXAM: MRI BRAIN WITHOUT CONTRAST 10/25/2024 01:00:00 AM TECHNIQUE: Multiplanar multisequence MRI of the head/brain was performed without the administration of intravenous contrast. COMPARISON: MRI head 09/22/2022. CLINICAL HISTORY: Neuro deficit, acute, stroke suspected FINDINGS: BRAIN AND VENTRICLES: No acute infarct. Remote left PCA territory infarct. Remote left basal ganglia and left frontal infarcts. No intracranial hemorrhage. No mass. No midline shift. No hydrocephalus. The sella is unremarkable. Diminuitive left MCA flow void compatible with known left MCA occlusion seen on same day CTA. ORBITS: No acute abnormality. SINUSES AND MASTOIDS: Left maxillary sinus mucosal thickening. BONES AND SOFT TISSUES: Normal marrow signal. Right parotid mass bettter charcterized on same day CTA head/neck. IMPRESSION: 1. No acute intracranial abnormality. 2. Remote left basal ganglia, left frontal and left PCA territory infarcts. 3. Right parotid mass bettter charcterized on same day CTA head/neck. Electronically signed by: Gilmore Molt 10/25/2024 01:34 AM EST RP Workstation: [id number]   CT Head Wo Contrast Result Date: 10/24/2024 EXAM: CT HEAD WITHOUT 10/24/2024 10:16:29 PM TECHNIQUE: CT of the head was performed without the administration  of intravenous contrast. Automated exposure control, iterative reconstruction, and/or weight based adjustment of the mA/kV was utilized to reduce the radiation dose to as low as reasonably achievable. COMPARISON: CT code stroke May 25, 2024 CLINICAL HISTORY: Headache, new onset (Age >= 51y) FINDINGS: BRAIN AND VENTRICLES: No acute intracranial hemorrhage. Similar remote left PCA territory, left basal ganglia, and left frontal cortical infarcts. No mass effect or midline shift. No extra-axial fluid collection. No evidence of acute infarct. No hydrocephalus. ORBITS: No acute abnormality. SINUSES AND MASTOIDS: Left maxillary sinus mucosal thickening. No mastoid effusions. SOFT TISSUES AND SKULL: No acute skull fracture. No acute soft tissue abnormality. IMPRESSION: 1. No acute intracranial abnormality. ASPECTS 10. 2. Similar remote left PCA territory, left basal ganglia, and left frontal cortical infarcts. Electronically signed by: Gilmore Molt 10/24/2024 10:58 PM EST RP Workstation: [id number]   CT ANGIO HEAD NECK W WO CM Result Date: 10/24/2024 EXAM: CTA Head and Neck with Intravenous Contrast. CT Head without Contrast. CLINICAL HISTORY: Neuro deficit, acute, stroke suspected TECHNIQUE: Axial CTA images of the head and neck performed with intravenous contrast. MIP reconstructed images were created and reviewed. Axial computed tomography images of the head/brain performed without intravenous contrast. Note: Per PQRS, the description of internal carotid artery percent stenosis, including 0 percent or normal exam, is based on [employer] Symptomatic Carotid Endarterectomy Trial (NASCET) criteria. Dose reduction technique was used including one or more of the following: automated exposure control, adjustment of mA and kV according to patient size, and/or iterative reconstruction. CONTRAST: 75 mL Omnipaque  COMPARISON: CTA head/neck May 25, 2024 FINDINGS: CT HEAD: BRAIN: Similar appearance of remote left  PCA  territory, left basal ganglia, and left frontal cortical infarcts. No acute intraparenchymal hemorrhage. No mass lesion. No CT evidence for acute territorial infarct. No midline shift or extra-axial collection. VENTRICLES: No hydrocephalus. ORBITS: The orbits are unremarkable. SINUSES AND MASTOIDS: Left maxillary sinus and scattered ethmoid air cells mucosal thickening. No mastoid effusion CTA NECK: COMMON CAROTID ARTERIES: No significant stenosis. No dissection or occlusion. INTERNAL CAROTID ARTERIES: No stenosis by NASCET criteria. No dissection or occlusion. 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 No dissection or occlusion. CTA HEAD: ANTERIOR CEREBRAL ARTERIES: No significant stenosis. No occlusion. No aneurysm. MIDDLE CEREBRAL ARTERIES: Similar left M1 MCA occlusion with more distal reconstitution. No significant stenosis of the right MCA. No aneurysm. POSTERIOR CEREBRAL ARTERIES: No significant stenosis. No occlusion. No aneurysm. BASILAR ARTERY: No significant stenosis. No occlusion. No aneurysm. OTHER: SOFT TISSUES: Similar 2.1 cm mass that appears to be arising from the right parotid mass. Increase in abnormally heterogeneous, rounded and now 1.1 cm right level II node on series 10, image 104. BONES: No acute osseous abnormality. IMPRESSION: 1. No evidence of acute intracranial abnormality. ASPECTS 10. 2. Similar left M1 MCA occlusion with more distal reconstitution. 3. 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 4. Similar 2.1 cm suspected right parotid mass with interval increase in abnormally heterogeneous, rounded and now 1.1 cm right level II node. 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 Recommend ENT consultation if not already made. Electronically signed by: Gilmore Molt 10/24/2024 10:55 PM EST RP Workstation: [id number]     PHYSICAL EXAM  Temp:  [97.6 F (36.4 C)-98.2 F (36.8  C)] 98.1 F (36.7 C) (12/14 1211) Pulse Rate:  [69-80] 72 (12/14 1211) Resp:  [16-18] 18 (12/14 1211) BP: (104-156)/(74-89) 126/79 (12/14 1211) SpO2:  [92 %-97 %] 95 % (12/14 1211)  General - Well nourished, well developed, in no apparent distress.  Cardiovascular - Regular rhythm and rate.  Mental Status -  Level of arousal and orientation to time, place, and person were intact. Language including expression, naming, repetition, comprehension was assessed and found intact. Attention span and concentration were normal. Recent and remote memory were intact. Fund of Knowledge was assessed and was intact.  Cranial Nerves II - XII - II - Unable to assess visual fields due to such poor visual acuity.  Patient can only count fingers at 1 feet.  He is legally blind III, IV, VI - Extraocular movements intact. V - Facial sensation intact bilaterally, though less on L. VII - Facial movement intact bilaterally. VIII - Hearing & vestibular intact bilaterally. X - Palate elevates symmetrically. XI - Chin turning & shoulder shrug intact bilaterally. XII - Tongue protrusion intact.  Motor Strength -mild spastic left hemiparesis with grade 3 /4 strength but poor and variable effort.  Left grip is weak.  Mild left ankle dorsiflexor and hip flexor weakness. Motor Tone -slight increased tone on the left.  l.  Sensory - Light touch, were assessed and were less, but present, on the left..    Coordination -poor effort but mild impaired left finger-to-nose and eatable coordination  Gait and Station - deferred.   ASSESSMENT/PLAN Mr. Stanley Stone is a 58 y.o. male with history of DM, HTN, HLD, COPD, polysubstance abuse, and medication non-compliance admitted for stroke-like symptoms.   Likely recrudescence of old stroke symptoms due to headache and hypertensive emergency MRI: No acute abnormality. Remote L basal ganglia, L frontal, and L PCA infarcts.  R parotid mass. CT: No acute infarct.   CTA: No acute abnormality.  Chronic left M1 occlusion, severe left V2 proximal stenosis.   2D Echo: EF 55 to 60%.  Left atrial size mildly dilated LDL 129 HgbA1c 9.6 UDS negative Lovenox  for VTE prophylaxis aspirin  325 mg daily prior to admission, now on home aspirin  325 mg daily.  Patient counseled to be compliant with his antithrombotic medications Ongoing aggressive stroke risk factor management Therapy recommendations: Pending Disposition: Pending  History of stroke/stroke recrudescence 03/2022 admitted for right-sided weakness and numbness.  CT showed no acute abnormality, old left BG and caudate head infarct.  MRI no acute infarct.  MRA head and neck left MCA occlusion, right M1 high-grade stenosis, severe intracranial stenosis.  EF 60 to 65%, LDL 76, A1c 8.2.  Discharged on DAPT for 3 months and Lipitor  39 09/2023 admitted for left PCA infarct.  CTA head and neck showed left M1 chronic occlusion, severe left P2 stenosis, moderate left VA stenosis.  Discharged on DAPT and Lipitor  80. 05/2024 admitted for left leg weakness.  MRI questionable right pontine infarct, which not convincing to me, likely recrudescence of stroke.  Status post TNK. Baseline residual left-sided mild weakness in bilateral vision loss.  Diabetes HgbA1c 9.6 goal < 7.0 Uncontrolled Currently on Farxiga , Trulicity , Glipizide , noncompliant with medication at home CBG monitoring SSI DM education and close PCP follow up  Hypertension Hypertensive emergency On presentation BP 241/100 with headache Stable now Home medications: Norvasc , Coreg , Diovan -HCT, but not compliant Now on home Norvasc  10, Coreg  25, Avapro  300, HCTZ 25 Gradually normalize BP in 2 to 3 days Long term BP goal normotensive  Hyperlipidemia Home meds: Lipitor  80mg  daily (not compliance) LDL 129, goal < 70 Now on home Lipitor  80mg  daily Continue statin at discharge  Right parotid mass CTA showed R parotid mass concerning for parotid  neoplasm. Radiology recommended ENT consult.  Tobacco abuse Current smoker Smoking cessation counseling provided Pt is willing to quit  Other Stroke Risk Factors ETOH use, patient stated he has not drunk for 3 days, admission alcohol level 66.  Alcohol limitation education provided. Obesity, Body mass index is 30.08 kg/m.  History of substance abuse  Other medical issues Severe bilateral vision loss, chronic for 2 years per patient COPD Homeless  Hospital day # 0  Recommend continue ongoing   PT/OT/SLP for symptomatic improvement and aspirin  for stroke prevention and aggressive risk factor modification.  Patient counseled to quit smoking.  Stroke team will be signing off. Please reach out again if there are any other concerns.  Discussed with Dr. Perri   I personally spent a total of 35 minutes in the care of the patient today including getting/reviewing separately obtained history, performing a medically appropriate exam/evaluation, counseling and educating, placing orders, referring and communicating with other health care professionals, documenting clinical information in the EHR, independently interpreting results, and coordinating care.        Eather Popp, MD Neurology Stroke Team 10/27/2024 12:49 PM           To contact Stroke Continuity provider, please refer to [email or web address]. After hours, contact General Neurology

## 2024-10-27 NOTE — TOC PASRR Note (Signed)
 [facility] IP TOC PASRR NOTE  RE: Stanley Stone  Date of Birth: 05-13-1966  Date: 10/27/2024    To Whom It May Concern:   Please be advised that the above-named patient will require a short-term nursing home stay - anticipated 30 days or less for rehabilitation and strengthening. The plan is for return home.

## 2024-10-28 ENCOUNTER — Other Ambulatory Visit (HOSPITAL_COMMUNITY): Payer: Self-pay

## 2024-10-28 LAB — PHOSPHORUS: Phosphorus: 4.2 mg/dL (ref 2.5–4.6)

## 2024-10-28 LAB — COMPREHENSIVE METABOLIC PANEL WITH GFR
ALT: 19 U/L (ref 0–44)
AST: 15 U/L (ref 15–41)
Albumin: 3.3 g/dL — ABNORMAL LOW (ref 3.5–5.0)
Alkaline Phosphatase: 73 U/L (ref 38–126)
Anion gap: 9 (ref 5–15)
BUN: 26 mg/dL — ABNORMAL HIGH (ref 6–20)
CO2: 30 mmol/L (ref 22–32)
Calcium: 8.7 mg/dL — ABNORMAL LOW (ref 8.9–10.3)
Chloride: 100 mmol/L (ref 98–111)
Creatinine, Ser: 1.57 mg/dL — ABNORMAL HIGH (ref 0.61–1.24)
GFR, Estimated: 50 mL/min — ABNORMAL LOW (ref >60)
Glucose, Bld: 117 mg/dL — ABNORMAL HIGH (ref 70–99)
Potassium: 3.4 mmol/L — ABNORMAL LOW (ref 3.5–5.1)
Sodium: 139 mmol/L (ref 135–145)
Total Bilirubin: 1.1 mg/dL (ref 0.0–1.2)
Total Protein: 6.5 g/dL (ref 6.5–8.1)

## 2024-10-28 LAB — URINALYSIS, ROUTINE W REFLEX MICROSCOPIC
Bacteria, UA: NONE SEEN
Bilirubin Urine: NEGATIVE
Glucose, UA: >=500 mg/dL — AB
Ketones, ur: NEGATIVE mg/dL
Leukocytes,Ua: NEGATIVE
Nitrite: NEGATIVE
Protein, ur: 30 mg/dL — AB
Specific Gravity, Urine: 1.011 (ref 1.005–1.030)
pH: 5 (ref 5.0–8.0)

## 2024-10-28 LAB — CBC WITH DIFFERENTIAL/PLATELET
Abs Immature Granulocytes: 0.06 K/uL (ref 0.00–0.07)
Basophils Absolute: 0 K/uL (ref 0.0–0.1)
Basophils Relative: 0 %
Eosinophils Absolute: 0.2 K/uL (ref 0.0–0.5)
Eosinophils Relative: 2 %
HCT: 46.4 % (ref 39.0–52.0)
Hemoglobin: 16.1 g/dL (ref 13.0–17.0)
Immature Granulocytes: 1 %
Lymphocytes Relative: 26 %
Lymphs Abs: 2.4 K/uL (ref 0.7–4.0)
MCH: 30.4 pg (ref 26.0–34.0)
MCHC: 34.7 g/dL (ref 30.0–36.0)
MCV: 87.5 fL (ref 80.0–100.0)
Monocytes Absolute: 0.9 K/uL (ref 0.1–1.0)
Monocytes Relative: 10 %
Neutro Abs: 5.8 K/uL (ref 1.7–7.7)
Neutrophils Relative %: 61 %
Platelets: 218 K/uL (ref 150–400)
RBC: 5.3 MIL/uL (ref 4.22–5.81)
RDW: 13 % (ref 11.5–15.5)
WBC: 9.4 K/uL (ref 4.0–10.5)
nRBC: 0 % (ref 0.0–0.2)

## 2024-10-28 LAB — MAGNESIUM: Magnesium: 2 mg/dL (ref 1.7–2.4)

## 2024-10-28 LAB — GLUCOSE, CAPILLARY
Glucose-Capillary: 157 mg/dL — ABNORMAL HIGH (ref 70–99)
Glucose-Capillary: 276 mg/dL — ABNORMAL HIGH (ref 70–99)
Glucose-Capillary: 187 mg/dL — ABNORMAL HIGH (ref 70–99)
Glucose-Capillary: 256 mg/dL — ABNORMAL HIGH (ref 70–99)

## 2024-10-28 MED ORDER — POTASSIUM CHLORIDE CRYS ER 20 MEQ PO TBCR
40.0000 meq | EXTENDED_RELEASE_TABLET | Freq: Once | ORAL | Status: AC
  Administered 2024-10-28: 08:00:00 40 meq via ORAL
  Filled 2024-10-28: qty 2

## 2024-10-28 MED ORDER — POLYETHYLENE GLYCOL 3350 17 G PO PACK
17.0000 g | PACK | Freq: Two times a day (BID) | ORAL | Status: DC
  Filled 2024-10-28 (×5): qty 1

## 2024-10-28 NOTE — Progress Notes (Signed)
 Physical Therapy Treatment Patient Details Name: Stanley Stone MRN: [id number] DOB: 1966-03-17 Today's Date: 10/28/2024   History of Present Illness Pt is a 58 year old man who presented on 10/24/24 with headache, worsening vision, slurred speech and L sided weakness. Per neurology note, MRI+ for punctual midline stroke at pontomedullary junction and R parotid mass. PMH: legally blind, alcohol abuse, medical noncompliance, CVA, DM, HLD, COPD, depression, homelessness.    PT Comments  Patient eager to work with therapy. Up in chair and requested to return to chair at end of session. Patient required min assist for transfer and ambulation with RW x 70 ft due to persistent buckling of bil LEs (?asterixis; muscles let go and then turn on before true buckle occurs).  For LE exercises, RLE moves smoothly, however LLE with significant delay and when it does move it is ataxic-like. Patient now reports he has spoken to his daughter and can go to stay with her after a inpatient therapy stay >3 hrs/day. Patient is interested in program through [facility]. [facility] aware.    If plan is discharge home, recommend the following: Assistance with cooking/housework;A little help with walking and/or transfers;Help with stairs or ramp for entrance   Can travel by private vehicle        Equipment Recommendations  Other (comment) (TBD next venue)    Recommendations for Other Services       Precautions / Restrictions Precautions Precautions: Fall Recall of Precautions/Restrictions: Intact Precaution/Restrictions Comments: low vision Restrictions Weight Bearing Restrictions Per Provider Order: No     Mobility  Bed Mobility               General bed mobility comments: up in recliner    Transfers Overall transfer level: Needs assistance Equipment used: Rolling walker (2 wheels) Transfers: Sit to/from Stand Sit to Stand: Contact guard assist           General transfer  comment: able to place and maintain L hand on walker handle    Ambulation/Gait Ambulation/Gait assistance: Min assist Gait Distance (Feet): 70 Feet Assistive device: Rolling walker (2 wheels) Gait Pattern/deviations: Step-to pattern, Decreased stride length, Knee flexed in stance - right, Knee flexed in stance - left, Knees buckling Gait velocity: decr     General Gait Details: light support due to quick release/buckling of bil Knees followed by catching and returning to upright; ?LLE slightly worse than RLE; did not improve or worsen with incr distance/time   Stairs             Wheelchair Mobility     Tilt Bed    Modified Rankin (Stroke Patients Only) Modified Rankin (Stroke Patients Only) Pre-Morbid Rankin Score: Moderate disability Modified Rankin: Moderately severe disability     Balance Overall balance assessment: Needs assistance   Sitting balance-Leahy Scale: Fair     Standing balance support: Bilateral upper extremity supported, Reliant on assistive device for balance Standing balance-Leahy Scale: Poor Standing balance comment: and external support                            Communication Communication Communication: Impaired Factors Affecting Communication: Reduced clarity of speech  Cognition Arousal: Alert Behavior During Therapy: WFL for tasks assessed/performed                             Following commands: Impaired Following commands impaired: Follows one step commands with increased  time    Cueing Cueing Techniques: Verbal cues, Tactile cues  Exercises General Exercises - Lower Extremity Ankle Circles/Pumps: AROM, Both, 5 reps Long Arc Quad: AROM, Both, 5 reps Hip Flexion/Marching: AROM, Both, 5 reps    General Comments        Pertinent Vitals/Pain Pain Assessment Pain Assessment: No/denies pain    Home Living                          Prior Function            PT Goals (current goals can  now be found in the care plan section) Acute Rehab PT Goals Patient Stated Goal: to improve mobility Time For Goal Achievement: 11/08/24 Potential to Achieve Goals: Good Progress towards PT goals: Progressing toward goals    Frequency    Min 2X/week      PT Plan      Co-evaluation              AM-PAC PT 6 Clicks Mobility   Outcome Measure  Help needed turning from your back to your side while in a flat bed without using bedrails?: A Little Help needed moving from lying on your back to sitting on the side of a flat bed without using bedrails?: A Little Help needed moving to and from a bed to a chair (including a wheelchair)?: A Little Help needed standing up from a chair using your arms (e.g., wheelchair or bedside chair)?: A Little Help needed to walk in hospital room?: A Little Help needed climbing 3-5 steps with a railing? : Total 6 Click Score: 16    End of Session Equipment Utilized During Treatment: Gait belt Activity Tolerance: Patient tolerated treatment well Patient left: with call bell/phone within reach;in chair;with chair alarm set Nurse Communication: Mobility status PT Visit Diagnosis: Unsteadiness on feet (R26.81);Other abnormalities of gait and mobility (R26.89);Muscle weakness (generalized) (M62.81)     Time: 8874-8861 PT Time Calculation (min) (ACUTE ONLY): 13 min  Charges:    $Gait Training: 8-22 mins PT General Charges $$ ACUTE PT VISIT: 1 Visit                      Macario RAMAN, PT Acute Rehabilitation Services  Office [phone]    Macario SHAUNNA Soja 10/28/2024, 11:54 AM

## 2024-10-28 NOTE — Progress Notes (Signed)
 PROGRESS NOTE    Stanley Stone  FMW:[id number] DOB: 06-23-1966 DOA: 10/24/2024 PCP: [facility]  Chief Complaint  Patient presents with   Numbness    Brief Narrative:   58 y.o. male with medical history significant of prior CVA, COPD, diabetes, hypertension, hyperlipidemia who presented emergency department due to slurred speech, vision changes and left-sided weakness.  CTA head and neck showed carotid mass and severe stenosis of vertebral artery. MRI brain showed no acute infarcts. Neurology was consulted and patient was admitted further workup   Assessment & Plan:   Principal Problem:   Hypertensive emergency Active Problems:   TIA (transient ischemic attack)  # Slurred speech and weakness most likely secondary to recrudescence of prior stroke due to hypertensive emergency - MRI negative for acute stroke -remote L basal ganglia, L frontal and L PCA territory infarcts -- CTA head/neck with severe L M1 MCA occlusion with more distal reconstitution, similar severe stenosis of the proximal L V2 vertebral artery - Neurology consulted - asked to reevaluate today due to persistence of L sided weakness.  Suspected recrudescence of old stroke sx due to HA and hypertensive emergency.  Continue statin, aspirin .    # AKI  - mild, follow UA. - if not improving, will follow renal US  - holding thiazide, arb  # Hypokalemia - replace, follow  # Parotid mass -2.1 cm right parotid mass with interval increase in abnormally heterogeneous rounded 1.1 cm right level 2 node concerning with primary parotid neoplasm with progressive nodal metastasis.   ENT consulted, Dr. Salina saw the patient.  Plan for workup of parotid mass as outpatient.   # Dysphagia -SLP recommending regular, thin liquids   # Hypertension -continue amlodipine , carvedilol  - irbesartan , hydrochlorothiazide  on hold with AKI   # GERD -continue Protonix    # Depression -continue Zoloft    #  Diabetes mellitus type 2 - A1c 9.6 - BG's uncontrolled - adjust insulin  - basal, bolus, ssi - carb mod diet   # Legally Blind  - noted   # Obesity Body mass index is 30.08 kg/m.    DVT prophylaxis: lovenox  Code Status: DNR Family Communication: none Disposition:   Status is: Inpatient Remains inpatient appropriate because: need for continued inpatient care, SNF placement    Consultants:  neurology  Procedures:  Echo IMPRESSIONS     1. Left ventricular ejection fraction, by estimation, is 55 to 60%. Left  ventricular ejection fraction by 2D MOD biplane is 60.1 %. The left  ventricle has normal function. The left ventricle has no regional wall  motion abnormalities. There is moderate  concentric left ventricular hypertrophy. Left ventricular diastolic  parameters are consistent with Grade I diastolic dysfunction (impaired  relaxation).   2. Right ventricular systolic function is normal. The right ventricular  size is normal. Tricuspid regurgitation signal is inadequate for assessing  PA pressure.   3. Left atrial size was mildly dilated.   4. The mitral valve is normal in structure. No evidence of mitral valve  regurgitation. No evidence of mitral stenosis.   5. The aortic valve is tricuspid. Aortic valve regurgitation is mild. No  aortic stenosis is present.   6. Aortic dilatation noted. There is moderate dilatation of the ascending  aorta, measuring 44 mm.   7. The inferior vena cava is normal in size with greater than 50%  respiratory variability, suggesting right atrial pressure of 3 mmHg.   Antimicrobials:  Anti-infectives (From admission, onward)    None  Subjective:  No new complaints  Objective: Vitals:   10/28/24 0320 10/28/24 0741 10/28/24 1126 10/28/24 1544  BP: 115/82 134/85 92/68 125/80  Pulse: 72 76 83 78  Resp: 18 15 19 15   Temp: 98.6 F (37 C) 98.7 F (37.1 C) 98.1 F (36.7 C) 98 F (36.7 C)  TempSrc: Oral Oral Oral Oral   SpO2: 96% 93% 97% 97%  Weight:      Height:        Intake/Output Summary (Last 24 hours) at 10/28/2024 1706 Last data filed at 10/28/2024 1600 Gross per 24 hour  Intake 240 ml  Output 2200 ml  Net -1960 ml   Filed Weights   10/24/24 2137  Weight: 82 kg    Examination:  General: No acute distress. Cardiovascular: RRR Lungs: unlabored Neurological: continued L sided weakness Extremities: No clubbing or cyanosis. No edema.    Data Reviewed: I have personally reviewed following labs and imaging studies  CBC: Recent Labs  Lab 10/24/24 2148 10/24/24 2158 10/25/24 0735 10/28/24 0329  WBC 8.4  --  7.7 9.4  NEUTROABS 5.4  --   --  5.8  HGB 15.8 16.7 14.6 16.1  HCT 46.5 49.0 43.1 46.4  MCV 89.6  --  89.4 87.5  PLT 208  --  176 218    Basic Metabolic Panel: Recent Labs  Lab 10/25/24 0313 10/25/24 0735 10/25/24 1210 10/26/24 0507 10/28/24 0329  NA 136 140 136 135 139  K 3.6 3.4* 3.8 3.7 3.4*  CL 102 105 101 99 100  CO2 21* 27 26 24 30   GLUCOSE 231* 120* 251* 261* 117*  BUN 13 12 10 15  26*  CREATININE 0.99 1.11 1.07 1.14 1.57*  CALCIUM  8.6* 8.7* 8.4* 8.7* 8.7*  MG  --   --   --   --  2.0  PHOS  --   --   --   --  4.2    GFR: Estimated Creatinine Clearance: 49.9 mL/min (Jailine Lieder) (by C-G formula based on SCr of 1.57 mg/dL (H)).  Liver Function Tests: Recent Labs  Lab 10/24/24 2148 10/28/24 0329  AST 17 15  ALT 23 19  ALKPHOS 82 73  BILITOT 0.7 1.1  PROT 7.2 6.5  ALBUMIN 3.7 3.3*    CBG: Recent Labs  Lab 10/27/24 1618 10/27/24 2105 10/28/24 0629 10/28/24 1127 10/28/24 1605  GLUCAP 363* 189* 157* 276* 187*     No results found for this or any previous visit (from the past 240 hours).       Radiology Studies: No results found.      Scheduled Meds:  amLODipine   10 mg Oral Daily   aspirin   325 mg Oral Daily   atorvastatin   80 mg Oral QPM   carvedilol   25 mg Oral BID WC   enoxaparin  (LOVENOX ) injection  40 mg Subcutaneous Daily    irbesartan   300 mg Oral Daily   And   hydrochlorothiazide   25 mg Oral Daily   insulin  aspart  0-20 Units Subcutaneous TID WC   insulin  aspart  0-5 Units Subcutaneous QHS   insulin  aspart  5 Units Subcutaneous TID WC   insulin  glargine  30 Units Subcutaneous Daily   nicotine   21 mg Transdermal Daily   pantoprazole   40 mg Oral Daily   polyethylene glycol  17 g Oral BID   sertraline   50 mg Oral Daily   Continuous Infusions:   LOS: 1 day    Time spent: over 30 min  Meliton Monte, MD [facility]   To contact the attending provider between 7A-7P or the covering provider during after hours 7P-7A, please log into the web site [email or web address] and access using universal [place] password for that web site. If you do not have the password, please call the hospital operator.  10/28/2024, 5:06 PM

## 2024-10-28 NOTE — TOC Initial Note (Signed)
 Transition of Care [facility]) - Initial/Assessment Note    Patient Details  Name: Stanley Stone MRN: [id number] Date of Birth: Oct 29, 1966  [facility] CM/SW Contact:    Andrez JULIANNA George, RN Phone Number: 10/28/2024, 1:29 PM  Clinical Narrative:                 Stanley Stone is a 58 y.o. male with medical history significant of prior CVA, COPD, diabetes, hypertension, hyperlipidemia who presented emergency department due to slurred speech, vision changes and left-sided weakness.  Pt is currently homeless. Recommendations are for rehab post hospital. Pt says he has been to inpatient rehab before at [facility]. He asked that his information be faxed to [facility]. Pt states he can stay with his daughter after rehab. With pt permission, CM spoke to daughter, Amy and she verified he can d/c to her home after rehab.  Awaiting [facility] to eval pt to see if able to offer a bed.   IP Care management following.  Expected Discharge Plan: IP Rehab Facility Barriers to Discharge: Continued Medical Work up   Patient Goals and CMS Choice   CMS Medicare.gov Compare Post Acute Care list provided to:: Patient Choice offered to / list presented to : Patient, Adult Children      Expected Discharge Plan and Services In-house Referral: Clinical Social Work Discharge Planning Services: CM Consult Post Acute Care Choice: IP Rehab Living arrangements for the past 2 months: Homeless                                      Prior Living Arrangements/Services Living arrangements for the past 2 months: Homeless Lives with:: Self Patient language and need for interpreter reviewed:: Yes Do you feel safe going back to the place where you live?: Yes      Need for Family Participation in Patient Care: Yes (Comment) Care giver support system in place?: Yes (comment) Current home services: DME (rollator) Criminal Activity/Legal Involvement Pertinent to Current Situation/Hospitalization: No -  Comment as needed  Activities of Daily Living      Permission Sought/Granted Permission sought to share information with : Case Manager, Magazine Features Editor, Family Supports                Emotional Assessment Appearance:: Appears stated age Attitude/Demeanor/Rapport: Engaged Affect (typically observed): Accepting Orientation: : Oriented to Self, Oriented to Place, Oriented to  Time, Oriented to Situation Alcohol / Substance Use: Other (comment) (has history of alcohol abuse) Psych Involvement: No (comment)  Admission diagnosis:  TIA (transient ischemic attack) [G45.9] Primary hypertension [I10] Hyperglycemia [R73.9] Acute left hemiparesis (HCC) [G81.94] Mass of right parotid gland [K11.8] Patient Active Problem List   Diagnosis Date Noted   TIA (transient ischemic attack) 10/27/2024   Hypertensive emergency 10/25/2024   Vision loss, bilateral 08/01/2023   Dental caries 02/28/2023   Hyperlipidemia associated with type 2 diabetes mellitus (HCC) 06/27/2022   History of ischemic right MCA stroke with residual deficit 03/25/2022   Anxiety state 02/18/2022   Onychomycosis 01/18/2022   Chronic bronchitis, unspecified chronic bronchitis type (HCC) 01/18/2022   DM (diabetes mellitus), type 2 (HCC) 02/07/2019   HTN (hypertension) 02/07/2019   History of alcohol abuse 02/07/2019   Tobacco use 02/07/2019   PCP:  [facility] Pharmacy:   [facility] [address], [address] [place] [address]  Phone: [phone] Fax: [phone]  [id number] - [place], [place] - [address] [place] [address] Phone: [phone] Fax: [phone]  [id number] - [place], [place] - [address] [place] [address] Phone: [phone] Fax: [phone]     Social Drivers of  Health (SDOH) Social History: SDOH Screenings   Food Insecurity: Food Insecurity Present (10/25/2024)  Housing: High Risk (10/25/2024)  Transportation Needs: Unmet Transportation Needs (10/25/2024)  Utilities: At Risk (10/25/2024)  Depression (PHQ2-9): Low Risk (02/28/2023)  Financial Resource Strain: Not on File (09/12/2023)   Received from [facility]  Physical Activity: Not on File (09/12/2023)   Received from [facility]  Social Connections: Not on File (09/12/2023)   Received from [facility]  Stress: Not on File (09/12/2023)   Received from [facility]  Tobacco Use: High Risk (10/24/2024)   SDOH Interventions:     Readmission Risk Interventions     No data to display

## 2024-10-28 NOTE — Progress Notes (Signed)
 Occupational Therapy Treatment Patient Details Name: Stanley Stone MRN: [id number] DOB: [birth year] Today's Date: 10/28/2024   History of present illness Pt is a 58 year old man who presented on 10/24/24 with headache, worsening vision, slurred speech and L sided weakness. Per neurology note, MRI+ for punctual midline stroke at pontomedullary junction and R parotid mass. PMH: legally blind, alcohol abuse, medical noncompliance, CVA, DM, HLD, COPD, depression, homelessness.   OT comments  Pt reports baseline minimal functional use of L UE. Pt able to come to EOB without physical assist, stand with CGA placing and maintaining L hand on RW and ambulate with min assist short distance in his room. Pt completed grooming with set up in sitting and remained up in chair at end of session. Pt requests inpatient rehab, specifically at [facility] as he has been there before, case management notified. Patient will benefit from intensive inpatient follow-up therapy, >3 hours/day.      If plan is discharge home, recommend the following:  A little help with walking and/or transfers;A lot of help with bathing/dressing/bathroom;Assistance with cooking/housework;Assist for transportation;Help with stairs or ramp for entrance;Direct supervision/assist for medications management;Direct supervision/assist for financial management   Equipment Recommendations  Other (comment) (defer)    Recommendations for Other Services      Precautions / Restrictions Precautions Precautions: Fall Recall of Precautions/Restrictions: Intact Precaution/Restrictions Comments: low vision Restrictions Weight Bearing Restrictions Per Provider Order: No       Mobility Bed Mobility Overal bed mobility: Needs Assistance Bed Mobility: Rolling, Sidelying to Sit Rolling: Modified independent (Device/Increase time) Sidelying to sit: Contact guard assist       General bed mobility comments: self assists L LE over EOB,  increased time    Transfers Overall transfer level: Needs assistance Equipment used: Rolling walker (2 wheels) Transfers: Sit to/from Stand Sit to Stand: Contact guard assist           General transfer comment: able to place and maintain L hand on walker handle     Balance Overall balance assessment: Needs assistance   Sitting balance-Leahy Scale: Fair     Standing balance support: Bilateral upper extremity supported, Reliant on assistive device for balance Standing balance-Leahy Scale: Poor                             ADL either performed or assessed with clinical judgement   ADL Overall ADL's : Needs assistance/impaired Eating/Feeding: Set up;Sitting   Grooming: Wash/dry hands;Wash/dry face;Sitting;Set up;Brushing hair                                      Extremity/Trunk Assessment              Vision       Perception     Praxis     Communication Communication Communication: No apparent difficulties   Cognition Arousal: Alert Behavior During Therapy: WFL for tasks assessed/performed                                 Following commands: Impaired Following commands impaired: Follows one step commands with increased time      Cueing   Cueing Techniques: Verbal cues  Exercises      Shoulder Instructions       General Comments  Pertinent Vitals/ Pain       Pain Assessment Pain Assessment: No/denies pain  Home Living                                          Prior Functioning/Environment              Frequency  Min 2X/week        Progress Toward Goals  OT Goals(current goals can now be found in the care plan section)  Progress towards OT goals: Progressing toward goals  Acute Rehab OT Goals OT Goal Formulation: With patient Time For Goal Achievement: 11/08/24 Potential to Achieve Goals: Good  Plan      Co-evaluation                 AM-PAC OT 6  Clicks Daily Activity     Outcome Measure   Help from another person eating meals?: A Little Help from another person taking care of personal grooming?: A Little Help from another person toileting, which includes using toliet, bedpan, or urinal?: A Lot Help from another person bathing (including washing, rinsing, drying)?: A Lot Help from another person to put on and taking off regular upper body clothing?: A Lot Help from another person to put on and taking off regular lower body clothing?: Total 6 Click Score: 13    End of Session Equipment Utilized During Treatment: Gait belt;Rolling walker (2 wheels)  OT Visit Diagnosis: Unsteadiness on feet (R26.81);Other symptoms and signs involving cognitive function;Muscle weakness (generalized) (M62.81);Hemiplegia and hemiparesis Hemiplegia - Right/Left: Left Hemiplegia - dominant/non-dominant: Non-Dominant Hemiplegia - caused by: Cerebral infarction   Activity Tolerance Patient tolerated treatment well   Patient Left in chair;with call bell/phone within reach;with chair alarm set   Nurse Communication          Time: 8969-8941 OT Time Calculation (min): 28 min  Charges: OT General Charges $OT Visit: 1 Visit OT Treatments $Self Care/Home Management : 23-37 mins  Mliss HERO, OTR/L Acute Rehabilitation Services Office: [phone]   Kennth Mliss Helling 10/28/2024, 11:31 AM

## 2024-10-28 NOTE — Progress Notes (Signed)
 Inpatient Rehab Admissions Coordinator:   Note therapy recommendations update to IPR.  Currently awaiting assessment from [facility], if unable to accept please let me know and we can assess for [facility].   Reche Lowers, PT, DPT Admissions Coordinator [phone] 10/28/2024 1:34 PM

## 2024-10-28 NOTE — TOC Progression Note (Signed)
 Transition of Care [facility]) - Progression Note    Patient Details  Name: Stanley Stone MRN: [id number] Date of Birth: [birth year]  Transition of Care [facility]) CM/SW Contact  Almarie CHRISTELLA Goodie, [place] Phone Number: 10/28/2024, 11:02 AM  Clinical Narrative:   Patient with no bed offers at this time. PASRR is pending, will submit to [facility] for review once a bed offer has been located. CSW to follow.    Expected Discharge Plan: Skilled Nursing Facility Barriers to Discharge: Continued Medical Work up, Awaiting State Approval (PASRR), Inadequate or no insurance, SNF Pending bed offer               Expected Discharge Plan and Services In-house Referral: Clinical Social Work     Living arrangements for the past 2 months: Hotel/Motel                                       Social Drivers of Health (SDOH) Interventions SDOH Screenings   Food Insecurity: Food Insecurity Present (10/25/2024)  Housing: High Risk (10/25/2024)  Transportation Needs: Unmet Transportation Needs (10/25/2024)  Utilities: At Risk (10/25/2024)  Depression (PHQ2-9): Low Risk (02/28/2023)  Financial Resource Strain: Not on File (09/12/2023)   Received from [facility]  Physical Activity: Not on File (09/12/2023)   Received from [facility]  Social Connections: Not on File (09/12/2023)   Received from [facility]  Stress: Not on File (09/12/2023)   Received from [facility]  Tobacco Use: High Risk (10/24/2024)    Readmission Risk Interventions     No data to display

## 2024-10-28 NOTE — TOC CAGE-AID Note (Signed)
 Transition of Care [facility]) - CAGE-AID Screening   Patient Details  Name: Buel Molder MRN: [id number] Date of Birth: Jun 01, 1966  Transition of Care [facility]) CM/SW Contact:    Andrez JULIANNA George, RN Phone Number: 10/28/2024, 3:42 PM   Clinical Narrative:  Pt refused counseling resources  CAGE-AID Screening:    Have You Ever Felt You Ought to Cut Down on Your Drinking or Drug Use?: No Have People Annoyed You By Critizing Your Drinking Or Drug Use?: No Have You Felt Bad Or Guilty About Your Drinking Or Drug Use?: No Have You Ever Had a Drink or Used Drugs First Thing In The Morning to Steady Your Nerves or to Get Rid of a Hangover?: No CAGE-AID Score: 0  Substance Abuse Education Offered: Yes (pt refused)

## 2024-10-28 NOTE — Progress Notes (Signed)
 Speech Language Pathology Treatment: Dysphagia  Patient Details Name: Stanley Stone MRN: [id number] DOB: [birth year] Today's Date: 10/28/2024 Time: 1348-1400 SLP Time Calculation (min) (ACUTE ONLY): 12 min  Assessment / Plan / Recommendation Clinical Impression  Pt was seen upright in his chair with good recall of SLP and MBS from Friday. He reports minimal if any coughing with PO intake since that time, reporting that he has been using recommended strategies consistently. He believes that this has resulted in improvement in his overall intake. Pt consumed regular solids, broken into smaller pieces, alongside thin liquids, with no overt s/s of aspiration. Education was reinforced. His speech was also noted to be intelligible but he continues to describe an acute change from baseline, describing that he will intermittently get hung up on certain words.   PLAN: Will continue with regular solids and thin liquids. Will f/u briefly for tolerance, but anticipate that any additional cognitive and communicative changes will be best addressed at next level of care.    HPI HPI: Stanley Stone is a 58 yo male presenting 12/11 with two days of worsening L weakness, worsening vision, and slurred speech. No acute abnormality per radiology report, but per neurology note, there is a punctate central hyperintensity at the pontomedullary junction. 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 Previous MBS in July 2025 revealed oropharyngeal deficits and suspected esophageal component but airway protection was complete. Regular diet and thin liquids were recommended. SLP services throughout that admission otherwise focused on communication/fluency. PMH includes: stroke with residual L weakness and bilateral vision loss, DM, HTN, hypercholesterolemia, COPD, AKI, medication noncompliance, EtOH abuse, polysubstance abuse, unhoused, GERD      SLP Plan  Continue with current plan of care         Swallow Evaluation Recommendations   Recommendations: PO diet PO Diet Recommendation: Regular;Thin liquids (Level 0) Liquid Administration via: Cup;Straw Medication Administration: Whole meds with liquid Supervision: Patient able to self-feed;Set-up assistance for safety Postural changes: Position pt fully upright for meals;Stay upright 30-60 min after meals Oral care recommendations: Oral care BID (2x/day)     Recommendations                         Frequent or constant Supervision/Assistance Dysphagia, unspecified (R13.10)     Continue with current plan of care     Leita SAILOR., M.A. CCC-SLP Acute Rehabilitation Services Office: [phone]  Secure chat preferred   10/28/2024, 3:31 PM

## 2024-10-29 LAB — COMPREHENSIVE METABOLIC PANEL WITH GFR
ALT: 23 U/L (ref 0–44)
AST: 17 U/L (ref 15–41)
Albumin: 3.2 g/dL — ABNORMAL LOW (ref 3.5–5.0)
Alkaline Phosphatase: 71 U/L (ref 38–126)
Anion gap: 13 (ref 5–15)
BUN: 29 mg/dL — ABNORMAL HIGH (ref 6–20)
CO2: 27 mmol/L (ref 22–32)
Calcium: 8.7 mg/dL — ABNORMAL LOW (ref 8.9–10.3)
Chloride: 98 mmol/L (ref 98–111)
Creatinine, Ser: 1.62 mg/dL — ABNORMAL HIGH (ref 0.61–1.24)
GFR, Estimated: 49 mL/min — ABNORMAL LOW (ref >60)
Glucose, Bld: 162 mg/dL — ABNORMAL HIGH (ref 70–99)
Potassium: 3.3 mmol/L — ABNORMAL LOW (ref 3.5–5.1)
Sodium: 138 mmol/L (ref 135–145)
Total Bilirubin: 0.8 mg/dL (ref 0.0–1.2)
Total Protein: 6 g/dL — ABNORMAL LOW (ref 6.5–8.1)

## 2024-10-29 LAB — CBC WITH DIFFERENTIAL/PLATELET
Abs Immature Granulocytes: 0.05 K/uL (ref 0.00–0.07)
Basophils Absolute: 0 K/uL (ref 0.0–0.1)
Basophils Relative: 0 %
Eosinophils Absolute: 0.1 K/uL (ref 0.0–0.5)
Eosinophils Relative: 2 %
HCT: 45.4 % (ref 39.0–52.0)
Hemoglobin: 15.5 g/dL (ref 13.0–17.0)
Immature Granulocytes: 1 %
Lymphocytes Relative: 26 %
Lymphs Abs: 2.3 K/uL (ref 0.7–4.0)
MCH: 30 pg (ref 26.0–34.0)
MCHC: 34.1 g/dL (ref 30.0–36.0)
MCV: 87.8 fL (ref 80.0–100.0)
Monocytes Absolute: 1.1 K/uL — ABNORMAL HIGH (ref 0.1–1.0)
Monocytes Relative: 12 %
Neutro Abs: 5.5 K/uL (ref 1.7–7.7)
Neutrophils Relative %: 59 %
Platelets: 204 K/uL (ref 150–400)
RBC: 5.17 MIL/uL (ref 4.22–5.81)
RDW: 13.1 % (ref 11.5–15.5)
WBC: 9.1 K/uL (ref 4.0–10.5)
nRBC: 0 % (ref 0.0–0.2)

## 2024-10-29 LAB — GLUCOSE, CAPILLARY
Glucose-Capillary: 178 mg/dL — ABNORMAL HIGH (ref 70–99)
Glucose-Capillary: 275 mg/dL — ABNORMAL HIGH (ref 70–99)
Glucose-Capillary: 192 mg/dL — ABNORMAL HIGH (ref 70–99)
Glucose-Capillary: 247 mg/dL — ABNORMAL HIGH (ref 70–99)

## 2024-10-29 LAB — MAGNESIUM: Magnesium: 2.1 mg/dL (ref 1.7–2.4)

## 2024-10-29 LAB — PHOSPHORUS: Phosphorus: 3.9 mg/dL (ref 2.5–4.6)

## 2024-10-29 MED ORDER — INSULIN GLARGINE 100 UNIT/ML ~~LOC~~ SOLN
6.0000 [IU] | Freq: Once | SUBCUTANEOUS | Status: AC
  Administered 2024-10-29: 17:00:00 6 [IU] via SUBCUTANEOUS
  Filled 2024-10-29 (×2): qty 0.06

## 2024-10-29 MED ORDER — INSULIN ASPART 100 UNIT/ML IJ SOLN
6.0000 [IU] | Freq: Three times a day (TID) | INTRAMUSCULAR | Status: DC
  Administered 2024-10-29 – 2024-10-30 (×3): 6 [IU] via SUBCUTANEOUS
  Filled 2024-10-29 (×3): qty 6

## 2024-10-29 MED ORDER — POTASSIUM CHLORIDE CRYS ER 20 MEQ PO TBCR
40.0000 meq | EXTENDED_RELEASE_TABLET | Freq: Once | ORAL | Status: AC
  Administered 2024-10-29: 09:00:00 40 meq via ORAL
  Filled 2024-10-29: qty 2

## 2024-10-29 MED ORDER — INSULIN GLARGINE 100 UNIT/ML ~~LOC~~ SOLN
36.0000 [IU] | Freq: Every day | SUBCUTANEOUS | Status: DC
  Administered 2024-10-30: 10:00:00 36 [IU] via SUBCUTANEOUS
  Filled 2024-10-29: qty 0.36

## 2024-10-29 NOTE — Inpatient Diabetes Management (Signed)
 Inpatient Diabetes Program Recommendations  AACE/ADA: New Consensus Statement on Inpatient Glycemic Control (2015)  Target Ranges:  Prepandial:   less than 140 mg/dL      Peak postprandial:   less than 180 mg/dL (1-2 hours)      Critically ill patients:  140 - 180 mg/dL   Lab Results  Component Value Date   GLUCAP 275 (H) 10/29/2024   HGBA1C 9.6 (H) 10/25/2024    Review of Glycemic Control  Latest Reference Range & Units 10/28/24 21:03 10/29/24 06:20 10/29/24 11:35  Glucose-Capillary 70 - 99 mg/dL 743 (H) 821 (H) 724 (H)   Diabetes history: DM 2 Outpatient Diabetes medications:  Per Med. Rec- patient not taking DM medications as ordered Farxiga  10 mg daily Trulicity  1.5 mg weekly Glipizide  XL 5 mg daily Lantus  30 units daily Metformin  1000 mg bid Current orders for Inpatient glycemic control:  Novolog  0-20 units tid with meals and HS Lantus  35 units daily Novolog  5 units tid with meals  Inpatient Diabetes Program Recommendations:    Please consider increasing Lantus  to 38 units daily and increase meal coverage to 6 units tid with meals.   Thanks,  Randall Bullocks, RN, BC-ADM Inpatient Diabetes Coordinator Pager [phone]  (8a-5p)

## 2024-10-29 NOTE — Progress Notes (Signed)
 PROGRESS NOTE    Stanley Stone  FMW:[id number] DOB: 20-Oct-1966 DOA: 10/24/2024 PCP: [facility]  Chief Complaint  Patient presents with   Numbness    Brief Narrative:   58 y.o. male with medical history significant of prior CVA, COPD, diabetes, hypertension, hyperlipidemia who presented emergency department due to slurred speech, vision changes and left-sided weakness.  CTA head and neck showed carotid mass and severe stenosis of vertebral artery. MRI brain showed no acute infarcts. Neurology was consulted and patient was admitted further workup   Assessment & Plan:   Principal Problem:   Hypertensive emergency Active Problems:   TIA (transient ischemic attack)  # Slurred speech and weakness most likely secondary to recrudescence of prior stroke due to hypertensive emergency - MRI negative for acute stroke -remote L basal ganglia, L frontal and L PCA territory infarcts -- CTA head/neck with severe L M1 MCA occlusion with more distal reconstitution, similar severe stenosis of the proximal L V2 vertebral artery - Neurology consulted - Suspected recrudescence of old stroke sx due to HA and hypertensive emergency.  Continue statin, aspirin .    # AKI  - mild, follow UA - 30 mg/dl protein, 0-5 RBC. - if not improving, will follow renal US  - holding thiazide, arb  # Hypokalemia - replace, follow  # Parotid mass -2.1 cm right parotid mass with interval increase in abnormally heterogeneous rounded 1.1 cm right level 2 node concerning with primary parotid neoplasm with progressive nodal metastasis.   ENT consulted, Dr. Salina saw the patient.  Plan for workup of parotid mass as outpatient.   # Dysphagia -SLP recommending regular, thin liquids   # Hypertension -continue amlodipine , carvedilol  - irbesartan , hydrochlorothiazide  on hold with AKI   # GERD -continue Protonix    # Depression -continue Zoloft    # Diabetes mellitus type 2 - A1c 9.6 -  BG's uncontrolled - adjust insulin  - basal, bolus, ssi - carb mod diet   # Legally Blind  - noted   # Obesity Body mass index is 30.08 kg/m.    DVT prophylaxis: lovenox  Code Status: DNR Family Communication: none Disposition:   Status is: Inpatient Remains inpatient appropriate because: need for continued inpatient care, SNF placement    Consultants:  neurology  Procedures:  Echo IMPRESSIONS     1. Left ventricular ejection fraction, by estimation, is 55 to 60%. Left  ventricular ejection fraction by 2D MOD biplane is 60.1 %. The left  ventricle has normal function. The left ventricle has no regional wall  motion abnormalities. There is moderate  concentric left ventricular hypertrophy. Left ventricular diastolic  parameters are consistent with Grade I diastolic dysfunction (impaired  relaxation).   2. Right ventricular systolic function is normal. The right ventricular  size is normal. Tricuspid regurgitation signal is inadequate for assessing  PA pressure.   3. Left atrial size was mildly dilated.   4. The mitral valve is normal in structure. No evidence of mitral valve  regurgitation. No evidence of mitral stenosis.   5. The aortic valve is tricuspid. Aortic valve regurgitation is mild. No  aortic stenosis is present.   6. Aortic dilatation noted. There is moderate dilatation of the ascending  aorta, measuring 44 mm.   7. The inferior vena cava is normal in size with greater than 50%  respiratory variability, suggesting right atrial pressure of 3 mmHg.   Antimicrobials:  Anti-infectives (From admission, onward)    None       Subjective:  No  new complaints  Objective: Vitals:   10/28/24 2305 10/29/24 0358 10/29/24 0747 10/29/24 1133  BP: (!) 104/56 115/65 122/80 114/70  Pulse: 71 71 71 75  Resp: 18 18 15 18   Temp: 98 F (36.7 C) 98 F (36.7 C) 97.6 F (36.4 C) 97.9 F (36.6 C)  TempSrc: Oral  Oral Oral  SpO2: 95% 97% 97% 94%  Weight:       Height:        Intake/Output Summary (Last 24 hours) at 10/29/2024 1445 Last data filed at 10/29/2024 1330 Gross per 24 hour  Intake 720 ml  Output 2500 ml  Net -1780 ml   Filed Weights   10/24/24 2137  Weight: 82 kg    Examination:  General: No acute distress. Cardiovascular: RRR Lungs: unlabored Neurological: L sided weakness Extremities: No clubbing or cyanosis. No edema.   Data Reviewed: I have personally reviewed following labs and imaging studies  CBC: Recent Labs  Lab 10/24/24 2148 10/24/24 2158 10/25/24 0735 10/28/24 0329 10/29/24 0200  WBC 8.4  --  7.7 9.4 9.1  NEUTROABS 5.4  --   --  5.8 5.5  HGB 15.8 16.7 14.6 16.1 15.5  HCT 46.5 49.0 43.1 46.4 45.4  MCV 89.6  --  89.4 87.5 87.8  PLT 208  --  176 218 204    Basic Metabolic Panel: Recent Labs  Lab 10/25/24 0735 10/25/24 1210 10/26/24 0507 10/28/24 0329 10/29/24 0200  NA 140 136 135 139 138  K 3.4* 3.8 3.7 3.4* 3.3*  CL 105 101 99 100 98  CO2 27 26 24 30 27   GLUCOSE 120* 251* 261* 117* 162*  BUN 12 10 15  26* 29*  CREATININE 1.11 1.07 1.14 1.57* 1.62*  CALCIUM  8.7* 8.4* 8.7* 8.7* 8.7*  MG  --   --   --  2.0 2.1  PHOS  --   --   --  4.2 3.9    GFR: Estimated Creatinine Clearance: 48.4 mL/min (Stanley Stone) (by C-G formula based on SCr of 1.62 mg/dL (H)).  Liver Function Tests: Recent Labs  Lab 10/24/24 2148 10/28/24 0329 10/29/24 0200  AST 17 15 17   ALT 23 19 23   ALKPHOS 82 73 71  BILITOT 0.7 1.1 0.8  PROT 7.2 6.5 6.0*  ALBUMIN 3.7 3.3* 3.2*    CBG: Recent Labs  Lab 10/28/24 1127 10/28/24 1605 10/28/24 2103 10/29/24 0620 10/29/24 1135  GLUCAP 276* 187* 256* 178* 275*     No results found for this or any previous visit (from the past 240 hours).       Radiology Studies: No results found.      Scheduled Meds:  amLODipine   10 mg Oral Daily   aspirin   325 mg Oral Daily   atorvastatin   80 mg Oral QPM   carvedilol   25 mg Oral BID WC   enoxaparin  (LOVENOX ) injection   40 mg Subcutaneous Daily   insulin  aspart  0-20 Units Subcutaneous TID WC   insulin  aspart  0-5 Units Subcutaneous QHS   insulin  aspart  6 Units Subcutaneous TID WC   [START ON 10/30/2024] insulin  glargine  36 Units Subcutaneous Daily   insulin  glargine  6 Units Subcutaneous Once   nicotine   21 mg Transdermal Daily   pantoprazole   40 mg Oral Daily   polyethylene glycol  17 g Oral BID   sertraline   50 mg Oral Daily   Continuous Infusions:   LOS: 2 days    Time spent: over 30 min  Stanley Monte, MD [facility]   To contact the attending provider between 7A-7P or the covering provider during after hours 7P-7A, please log into the web site [email or web address] and access using universal [place] password for that web site. If you do not have the password, please call the hospital operator.  10/29/2024, 2:45 PM

## 2024-10-29 NOTE — TOC Progression Note (Signed)
 Transition of Care [facility]) - Progression Note    Patient Details  Name: Stanley Stone MRN: [id number] Date of Birth: [birth year]  Transition of Care [facility]) CM/SW Contact  Andrez JULIANNA George, RN Phone Number: 10/29/2024, 2:46 PM  Clinical Narrative:     [facility] has declined to offer a bed. CIR is going to eval.  IP Care management following.  Expected Discharge Plan: IP Rehab Facility Barriers to Discharge: Continued Medical Work up               Expected Discharge Plan and Services In-house Referral: Clinical Social Work Discharge Planning Services: CM Consult Post Acute Care Choice: IP Rehab Living arrangements for the past 2 months: Homeless                                       Social Drivers of Health (SDOH) Interventions SDOH Screenings   Food Insecurity: Food Insecurity Present (10/25/2024)  Housing: High Risk (10/25/2024)  Transportation Needs: Unmet Transportation Needs (10/25/2024)  Utilities: At Risk (10/25/2024)  Depression (PHQ2-9): Low Risk (02/28/2023)  Financial Resource Strain: Not on File (09/12/2023)   Received from [facility]  Physical Activity: Not on File (09/12/2023)   Received from [facility]  Social Connections: Not on File (09/12/2023)   Received from [facility]  Stress: Not on File (09/12/2023)   Received from [facility]  Tobacco Use: High Risk (10/24/2024)    Readmission Risk Interventions     No data to display

## 2024-10-29 NOTE — Progress Notes (Signed)
 Inpatient Rehab Admissions Coordinator:   Per [facility] is not able to accept pt.  I will place a consult order for assessment for [facility] CIR per protocol.   Reche Lowers, PT, DPT Admissions Coordinator [phone] 10/29/2024 1:40 PM

## 2024-10-29 NOTE — Plan of Care (Signed)
  Problem: Coping: Goal: Will verbalize positive feelings about self Outcome: Progressing Goal: Will identify appropriate support needs Outcome: Progressing   Problem: Self-Care: Goal: Ability to participate in self-care as condition permits will improve Outcome: Progressing   Problem: Nutrition: Goal: Risk of aspiration will decrease Outcome: Progressing Goal: Dietary intake will improve Outcome: Progressing

## 2024-10-30 ENCOUNTER — Encounter (HOSPITAL_COMMUNITY): Payer: Self-pay | Admitting: Physical Medicine and Rehabilitation

## 2024-10-30 ENCOUNTER — Other Ambulatory Visit: Payer: Self-pay

## 2024-10-30 ENCOUNTER — Inpatient Hospital Stay (HOSPITAL_COMMUNITY)
Admission: AD | Admit: 2024-10-30 | Discharge: 2024-11-09 | DRG: 057 | Disposition: A | Discharge status: Home / Self Care | Source: Intra-hospital | Attending: Physical Medicine and Rehabilitation | Admitting: Physical Medicine and Rehabilitation

## 2024-10-30 DIAGNOSIS — I69319 Unspecified symptoms and signs involving cognitive functions following cerebral infarction: Secondary | ICD-10-CM

## 2024-10-30 DIAGNOSIS — E7800 Pure hypercholesterolemia, unspecified: Secondary | ICD-10-CM | POA: Diagnosis present

## 2024-10-30 DIAGNOSIS — I639 Cerebral infarction, unspecified: Secondary | ICD-10-CM | POA: Diagnosis not present

## 2024-10-30 DIAGNOSIS — R221 Localized swelling, mass and lump, neck: Secondary | ICD-10-CM | POA: Diagnosis present

## 2024-10-30 DIAGNOSIS — T380X5A Adverse effect of glucocorticoids and synthetic analogues, initial encounter: Secondary | ICD-10-CM | POA: Diagnosis present

## 2024-10-30 DIAGNOSIS — E119 Type 2 diabetes mellitus without complications: Secondary | ICD-10-CM | POA: Diagnosis present

## 2024-10-30 DIAGNOSIS — Z79899 Other long term (current) drug therapy: Secondary | ICD-10-CM | POA: Diagnosis not present

## 2024-10-30 DIAGNOSIS — Z91199 Patient's noncompliance with other medical treatment and regimen due to unspecified reason: Secondary | ICD-10-CM | POA: Diagnosis not present

## 2024-10-30 DIAGNOSIS — T383X6A Underdosing of insulin and oral hypoglycemic [antidiabetic] drugs, initial encounter: Secondary | ICD-10-CM | POA: Diagnosis present

## 2024-10-30 DIAGNOSIS — K219 Gastro-esophageal reflux disease without esophagitis: Secondary | ICD-10-CM | POA: Diagnosis present

## 2024-10-30 DIAGNOSIS — Z91148 Patient's other noncompliance with medication regimen for other reason: Secondary | ICD-10-CM | POA: Diagnosis not present

## 2024-10-30 DIAGNOSIS — Z7984 Long term (current) use of oral hypoglycemic drugs: Secondary | ICD-10-CM

## 2024-10-30 DIAGNOSIS — K5900 Constipation, unspecified: Secondary | ICD-10-CM | POA: Diagnosis not present

## 2024-10-30 DIAGNOSIS — Z8616 Personal history of COVID-19: Secondary | ICD-10-CM | POA: Diagnosis not present

## 2024-10-30 DIAGNOSIS — Z794 Long term (current) use of insulin: Secondary | ICD-10-CM | POA: Diagnosis not present

## 2024-10-30 DIAGNOSIS — Z7982 Long term (current) use of aspirin: Secondary | ICD-10-CM

## 2024-10-30 DIAGNOSIS — F17210 Nicotine dependence, cigarettes, uncomplicated: Secondary | ICD-10-CM | POA: Diagnosis present

## 2024-10-30 DIAGNOSIS — J449 Chronic obstructive pulmonary disease, unspecified: Secondary | ICD-10-CM | POA: Diagnosis present

## 2024-10-30 DIAGNOSIS — E1165 Type 2 diabetes mellitus with hyperglycemia: Secondary | ICD-10-CM

## 2024-10-30 DIAGNOSIS — Z880 Allergy status to penicillin: Secondary | ICD-10-CM | POA: Diagnosis not present

## 2024-10-30 DIAGNOSIS — I10 Essential (primary) hypertension: Secondary | ICD-10-CM | POA: Diagnosis present

## 2024-10-30 DIAGNOSIS — I69354 Hemiplegia and hemiparesis following cerebral infarction affecting left non-dominant side: Secondary | ICD-10-CM | POA: Diagnosis present

## 2024-10-30 DIAGNOSIS — F32A Depression, unspecified: Secondary | ICD-10-CM | POA: Diagnosis present

## 2024-10-30 DIAGNOSIS — K5901 Slow transit constipation: Secondary | ICD-10-CM | POA: Diagnosis not present

## 2024-10-30 DIAGNOSIS — K118 Other diseases of salivary glands: Secondary | ICD-10-CM | POA: Diagnosis not present

## 2024-10-30 DIAGNOSIS — R739 Hyperglycemia, unspecified: Secondary | ICD-10-CM | POA: Diagnosis not present

## 2024-10-30 DIAGNOSIS — R6884 Jaw pain: Secondary | ICD-10-CM | POA: Diagnosis present

## 2024-10-30 DIAGNOSIS — H548 Legal blindness, as defined in USA: Secondary | ICD-10-CM | POA: Diagnosis present

## 2024-10-30 LAB — GLUCOSE, CAPILLARY
Glucose-Capillary: 148 mg/dL — ABNORMAL HIGH (ref 70–99)
Glucose-Capillary: 145 mg/dL — ABNORMAL HIGH (ref 70–99)
Glucose-Capillary: 216 mg/dL — ABNORMAL HIGH (ref 70–99)
Glucose-Capillary: 209 mg/dL — ABNORMAL HIGH (ref 70–99)
Glucose-Capillary: 217 mg/dL — ABNORMAL HIGH (ref 70–99)

## 2024-10-30 LAB — CBC
HCT: 48.8 % (ref 39.0–52.0)
HCT: 43.2 % (ref 39.0–52.0)
Hemoglobin: 16.5 g/dL (ref 13.0–17.0)
Hemoglobin: 14.5 g/dL (ref 13.0–17.0)
MCH: 30.2 pg (ref 26.0–34.0)
MCH: 29.9 pg (ref 26.0–34.0)
MCHC: 33.8 g/dL (ref 30.0–36.0)
MCHC: 33.6 g/dL (ref 30.0–36.0)
MCV: 89.4 fL (ref 80.0–100.0)
MCV: 89.1 fL (ref 80.0–100.0)
Platelets: 200 K/uL (ref 150–400)
Platelets: 209 K/uL (ref 150–400)
RBC: 5.46 MIL/uL (ref 4.22–5.81)
RBC: 4.85 MIL/uL (ref 4.22–5.81)
RDW: 13.2 % (ref 11.5–15.5)
RDW: 13.2 % (ref 11.5–15.5)
WBC: 8.6 K/uL (ref 4.0–10.5)
WBC: 9.5 K/uL (ref 4.0–10.5)
nRBC: 0 % (ref 0.0–0.2)
nRBC: 0 % (ref 0.0–0.2)

## 2024-10-30 LAB — BASIC METABOLIC PANEL WITH GFR
Anion gap: 13 (ref 5–15)
BUN: 25 mg/dL — ABNORMAL HIGH (ref 6–20)
CO2: 25 mmol/L (ref 22–32)
Calcium: 9.4 mg/dL (ref 8.9–10.3)
Chloride: 100 mmol/L (ref 98–111)
Creatinine, Ser: 1.37 mg/dL — ABNORMAL HIGH (ref 0.61–1.24)
GFR, Estimated: 59 mL/min — ABNORMAL LOW (ref >60)
Glucose, Bld: 155 mg/dL — ABNORMAL HIGH (ref 70–99)
Potassium: 4.1 mmol/L (ref 3.5–5.1)
Sodium: 137 mmol/L (ref 135–145)

## 2024-10-30 LAB — PHOSPHORUS: Phosphorus: 3.3 mg/dL (ref 2.5–4.6)

## 2024-10-30 LAB — CREATININE, SERUM
Creatinine, Ser: 1.26 mg/dL — ABNORMAL HIGH (ref 0.61–1.24)
GFR, Estimated: >60 mL/min (ref >60)

## 2024-10-30 LAB — MAGNESIUM: Magnesium: 2.2 mg/dL (ref 1.7–2.4)

## 2024-10-30 MED ORDER — ACETAMINOPHEN 650 MG RE SUPP: 650.0000 mg | Freq: Four times a day (QID) | RECTAL | Status: DC | PRN

## 2024-10-30 MED ORDER — VITAMIN D 25 MCG (1000 UNIT) PO TABS
1000.0000 [IU] | ORAL_TABLET | Freq: Every day | ORAL | Status: DC
  Administered 2024-10-31 – 2024-11-06 (×7): 1000 [IU] via ORAL
  Filled 2024-10-30 (×7): qty 1

## 2024-10-30 MED ORDER — INSULIN GLARGINE 100 UNIT/ML ~~LOC~~ SOLN
36.0000 [IU] | Freq: Every day | SUBCUTANEOUS | Status: DC
  Administered 2024-10-31: 08:00:00 36 [IU] via SUBCUTANEOUS
  Filled 2024-10-30 (×2): qty 0.36

## 2024-10-30 MED ORDER — NICOTINE 21 MG/24HR TD PT24
21.0000 mg | MEDICATED_PATCH | Freq: Every day | TRANSDERMAL | Status: DC
  Administered 2024-10-31 – 2024-11-02 (×3): 21 mg via TRANSDERMAL
  Filled 2024-10-30 (×3): qty 1

## 2024-10-30 MED ORDER — SERTRALINE HCL 50 MG PO TABS
50.0000 mg | ORAL_TABLET | Freq: Every day | ORAL | Status: DC
  Administered 2024-10-31 – 2024-11-09 (×10): 50 mg via ORAL
  Filled 2024-10-30 (×10): qty 1

## 2024-10-30 MED ORDER — ATORVASTATIN CALCIUM 80 MG PO TABS
80.0000 mg | ORAL_TABLET | Freq: Every evening | ORAL | Status: DC
  Administered 2024-10-30 – 2024-11-08 (×10): 80 mg via ORAL
  Filled 2024-10-30 (×10): qty 1

## 2024-10-30 MED ORDER — ONDANSETRON HCL 4 MG/2ML IJ SOLN: 4.0000 mg | Freq: Four times a day (QID) | INTRAMUSCULAR | Status: DC | PRN

## 2024-10-30 MED ORDER — INSULIN ASPART 100 UNIT/ML IJ SOLN
0.0000 [IU] | Freq: Three times a day (TID) | INTRAMUSCULAR | Status: DC
  Administered 2024-10-30: 18:00:00 7 [IU] via SUBCUTANEOUS
  Administered 2024-10-31: 07:00:00 3 [IU] via SUBCUTANEOUS
  Administered 2024-10-31: 18:00:00 11 [IU] via SUBCUTANEOUS
  Administered 2024-10-31 – 2024-11-01 (×2): 4 [IU] via SUBCUTANEOUS
  Administered 2024-11-01: 3 [IU] via SUBCUTANEOUS
  Administered 2024-11-01: 11 [IU] via SUBCUTANEOUS
  Administered 2024-11-02 – 2024-11-03 (×2): 4 [IU] via SUBCUTANEOUS
  Administered 2024-11-03: 11 [IU] via SUBCUTANEOUS
  Administered 2024-11-04: 4 [IU] via SUBCUTANEOUS
  Administered 2024-11-04: 15 [IU] via SUBCUTANEOUS
  Administered 2024-11-04: 7 [IU] via SUBCUTANEOUS
  Administered 2024-11-05: 3 [IU] via SUBCUTANEOUS
  Administered 2024-11-05: 20 [IU] via SUBCUTANEOUS
  Administered 2024-11-06: 7 [IU] via SUBCUTANEOUS
  Administered 2024-11-06: 4 [IU] via SUBCUTANEOUS
  Administered 2024-11-06: 3 [IU] via SUBCUTANEOUS
  Administered 2024-11-07: 7 [IU] via SUBCUTANEOUS
  Administered 2024-11-07: 3 [IU] via SUBCUTANEOUS
  Administered 2024-11-08: 7 [IU] via SUBCUTANEOUS
  Administered 2024-11-08: 4 [IU] via SUBCUTANEOUS
  Filled 2024-10-30: qty 11
  Filled 2024-10-30: qty 3
  Filled 2024-10-30: qty 4
  Filled 2024-10-30: qty 3
  Filled 2024-10-30: qty 7
  Filled 2024-10-30 (×2): qty 4
  Filled 2024-10-30: qty 15
  Filled 2024-10-30: qty 10
  Filled 2024-10-30: qty 11
  Filled 2024-10-30: qty 20
  Filled 2024-10-30: qty 4
  Filled 2024-10-30: qty 7
  Filled 2024-10-30: qty 3
  Filled 2024-10-30: qty 7
  Filled 2024-10-30: qty 3
  Filled 2024-10-30: qty 4
  Filled 2024-10-30: qty 11
  Filled 2024-10-30 (×2): qty 7
  Filled 2024-10-30: qty 4

## 2024-10-30 MED ORDER — PANTOPRAZOLE SODIUM 40 MG PO TBEC
40.0000 mg | DELAYED_RELEASE_TABLET | Freq: Every day | ORAL | Status: DC
  Administered 2024-10-31 – 2024-11-09 (×10): 40 mg via ORAL
  Filled 2024-10-30 (×10): qty 1

## 2024-10-30 MED ORDER — B COMPLEX-C PO TABS
1.0000 | ORAL_TABLET | Freq: Every day | ORAL | Status: DC
  Administered 2024-10-31 – 2024-11-02 (×3): 1 via ORAL
  Filled 2024-10-30 (×3): qty 1

## 2024-10-30 MED ORDER — INSULIN ASPART 100 UNIT/ML IJ SOLN
6.0000 [IU] | Freq: Three times a day (TID) | INTRAMUSCULAR | Status: DC
  Administered 2024-10-31 – 2024-11-09 (×28): 6 [IU] via SUBCUTANEOUS
  Filled 2024-10-30 (×26): qty 6

## 2024-10-30 MED ORDER — AMLODIPINE BESYLATE 5 MG PO TABS
5.0000 mg | ORAL_TABLET | Freq: Every day | ORAL | Status: DC
  Administered 2024-10-31: 08:00:00 5 mg via ORAL
  Filled 2024-10-30: qty 1

## 2024-10-30 MED ORDER — ONDANSETRON HCL 4 MG PO TABS: 4.0000 mg | ORAL_TABLET | Freq: Four times a day (QID) | ORAL | Status: DC | PRN

## 2024-10-30 MED ORDER — CARVEDILOL 25 MG PO TABS
25.0000 mg | ORAL_TABLET | Freq: Two times a day (BID) | ORAL | Status: DC
  Administered 2024-10-30 – 2024-11-09 (×20): 25 mg via ORAL
  Filled 2024-10-30 (×20): qty 1

## 2024-10-30 MED ORDER — MAGNESIUM OXIDE -MG SUPPLEMENT 400 (240 MG) MG PO TABS
200.0000 mg | ORAL_TABLET | Freq: Every day | ORAL | Status: DC
  Administered 2024-10-31 – 2024-11-06 (×7): 200 mg via ORAL
  Filled 2024-10-30 (×8): qty 1

## 2024-10-30 MED ORDER — ENOXAPARIN SODIUM 40 MG/0.4ML IJ SOSY: 40.0000 mg | PREFILLED_SYRINGE | INTRAMUSCULAR | Status: DC

## 2024-10-30 MED ORDER — POLYETHYLENE GLYCOL 3350 17 G PO PACK: 17.0000 g | PACK | Freq: Two times a day (BID) | ORAL | Status: DC

## 2024-10-30 MED ORDER — L-METHYLFOLATE-B6-B12 3-35-2 MG PO TABS
1.0000 | ORAL_TABLET | Freq: Every day | ORAL | Status: DC
  Administered 2024-10-31 – 2024-11-02 (×3): 1 via ORAL
  Filled 2024-10-30 (×3): qty 1

## 2024-10-30 MED ORDER — AMLODIPINE BESYLATE 10 MG PO TABS: 10.0000 mg | ORAL_TABLET | Freq: Every day | ORAL | Status: DC

## 2024-10-30 MED ORDER — POLYETHYLENE GLYCOL 3350 17 G PO PACK
17.0000 g | PACK | Freq: Every day | ORAL | Status: DC
  Filled 2024-10-30 (×2): qty 1

## 2024-10-30 MED ORDER — INSULIN ASPART 100 UNIT/ML IJ SOLN: 0.0000 [IU] | Freq: Three times a day (TID) | INTRAMUSCULAR | Status: DC

## 2024-10-30 MED ORDER — ASPIRIN 325 MG PO TABS
325.0000 mg | ORAL_TABLET | Freq: Every day | ORAL | Status: DC
  Administered 2024-10-31 – 2024-11-09 (×10): 325 mg via ORAL
  Filled 2024-10-30 (×11): qty 1

## 2024-10-30 MED ORDER — INSULIN GLARGINE 100 UNIT/ML ~~LOC~~ SOLN: 36.0000 [IU] | Freq: Every day | SUBCUTANEOUS | Status: DC

## 2024-10-30 MED ORDER — ENOXAPARIN SODIUM 40 MG/0.4ML IJ SOSY
40.0000 mg | PREFILLED_SYRINGE | Freq: Every day | INTRAMUSCULAR | Status: DC
  Administered 2024-10-31 – 2024-11-09 (×10): 40 mg via SUBCUTANEOUS
  Filled 2024-10-30 (×11): qty 0.4

## 2024-10-30 MED ORDER — ACETAMINOPHEN 325 MG PO TABS
650.0000 mg | ORAL_TABLET | Freq: Four times a day (QID) | ORAL | Status: DC | PRN
  Administered 2024-10-31 – 2024-11-07 (×3): 650 mg via ORAL
  Filled 2024-10-30 (×4): qty 2

## 2024-10-30 NOTE — Progress Notes (Signed)
 Physical Therapy Treatment Patient Details Name: Stanley Stone MRN: [id number] DOB: Mar 08, 1966 Today's Date: 10/30/2024   History of Present Illness Pt is a 58 year old man who presented on 10/24/24 with headache, worsening vision, slurred speech and L sided weakness. Per neurology note, MRI+ for punctual midline stroke at pontomedullary junction and R parotid mass. PMH: legally blind, alcohol abuse, medical noncompliance, CVA, DM, HLD, COPD, depression, homelessness.    PT Comments  Patient eager to work with therapy. Reports he did not get OOB at all yesterday. Slightly lightheaded upon sitting EOB which improved with several minutes of seated LE exercises. Required vc for hand position with sit to stand with RW and repeated an additional 5 reps for practice/education/ strengthening. Ambulated incr distance of 100 ft with RW and min assist with persistent partial buckling of LLE>RLE (pt bounces with partial buckle and recover). Up in chair on departure.     If plan is discharge home, recommend the following: Assistance with cooking/housework;A little help with walking and/or transfers;Help with stairs or ramp for entrance   Can travel by private vehicle     No  Equipment Recommendations  Other (comment) (TBD next venue)    Recommendations for Other Services       Precautions / Restrictions Precautions Precautions: Fall Recall of Precautions/Restrictions: Intact Precaution/Restrictions Comments: low vision Restrictions Weight Bearing Restrictions Per Provider Order: No     Mobility  Bed Mobility Overal bed mobility: Needs Assistance Bed Mobility: Rolling, Sidelying to Sit Rolling: Modified independent (Device/Increase time) Sidelying to sit: Contact guard assist, Used rails       General bed mobility comments: slight lightheadedness on sitting therefore CGA for safety    Transfers Overall transfer level: Needs assistance Equipment used: Rolling walker (2  wheels) Transfers: Sit to/from Stand Sit to Stand: Contact guard assist           General transfer comment: able to place and maintain L hand on walker handle; vc to use RUE to push up from surface    Ambulation/Gait Ambulation/Gait assistance: Min assist Gait Distance (Feet): 100 Feet Assistive device: Rolling walker (2 wheels) Gait Pattern/deviations: Step-to pattern, Decreased stride length, Knee flexed in stance - right, Knee flexed in stance - left, Knees buckling Gait velocity: decr     General Gait Details: light support due to quick release/buckling of bil Knees followed by catching and returning to upright; LLE slightly worse than RLE; did not improve or worsen with incr distance/time   Stairs             Wheelchair Mobility     Tilt Bed    Modified Rankin (Stroke Patients Only) Modified Rankin (Stroke Patients Only) Pre-Morbid Rankin Score: Moderate disability Modified Rankin: Moderately severe disability     Balance Overall balance assessment: Needs assistance   Sitting balance-Leahy Scale: Fair     Standing balance support: Bilateral upper extremity supported, Reliant on assistive device for balance Standing balance-Leahy Scale: Poor Standing balance comment: and external support                            Communication Communication Communication: Impaired Factors Affecting Communication: Reduced clarity of speech  Cognition Arousal: Alert Behavior During Therapy: WFL for tasks assessed/performed   PT - Cognitive impairments: No family/caregiver present to determine baseline                       PT - Cognition Comments:  followed all simple commands with some delay Following commands: Impaired Following commands impaired: Follows one step commands with increased time    Cueing Cueing Techniques: Verbal cues, Tactile cues  Exercises General Exercises - Lower Extremity Long Arc Quad: AROM, Both, 5 reps Other  Exercises Other Exercises: sit to stand x 5 reps for sequencing and strengthening    General Comments        Pertinent Vitals/Pain Pain Assessment Pain Assessment: No/denies pain    Home Living                          Prior Function            PT Goals (current goals can now be found in the care plan section) Acute Rehab PT Goals Patient Stated Goal: to improve mobility PT Goal Formulation: With patient Time For Goal Achievement: 11/13/24 Potential to Achieve Goals: Good Progress towards PT goals: Goals met and updated - see care plan    Frequency    Min 2X/week      PT Plan      Co-evaluation              AM-PAC PT 6 Clicks Mobility   Outcome Measure  Help needed turning from your back to your side while in a flat bed without using bedrails?: A Little Help needed moving from lying on your back to sitting on the side of a flat bed without using bedrails?: A Little Help needed moving to and from a bed to a chair (including a wheelchair)?: A Little Help needed standing up from a chair using your arms (e.g., wheelchair or bedside chair)?: A Little Help needed to walk in hospital room?: A Little Help needed climbing 3-5 steps with a railing? : Total 6 Click Score: 16    End of Session Equipment Utilized During Treatment: Gait belt Activity Tolerance: Patient tolerated treatment well Patient left: with call bell/phone within reach;in chair;with chair alarm set Nurse Communication: Mobility status PT Visit Diagnosis: Unsteadiness on feet (R26.81);Other abnormalities of gait and mobility (R26.89);Muscle weakness (generalized) (M62.81)     Time: 9083-9067 PT Time Calculation (min) (ACUTE ONLY): 16 min  Charges:    $Gait Training: 8-22 mins PT General Charges $$ ACUTE PT VISIT: 1 Visit                      Macario RAMAN, PT Acute Rehabilitation Services  Office [phone]    Macario SHAUNNA Soja 10/30/2024, 9:40 AM

## 2024-10-30 NOTE — Progress Notes (Signed)
° °  Brief Progress Note   _____________________________________________________________________________________________________________  Patient Name: Stanley Stone Patient DOB: 04/21/1966 Date: @TODAY @  Pt has a bed in CIR, no barriers noted at this time _____________________________________________________________________________________________________________  [facility] RN Expeditor Ronal DELENA Bald Please contact us  directly via secure chat (search for [facility]) or by calling us  at [phone] [facility]).

## 2024-10-30 NOTE — H&P (Shared)
 Physical Medicine and Rehabilitation Admission H&P    Chief Complaint  Patient presents with   Numbness  : HPI: Stanley Stone is a 58 year old right-handed male with history significant for COPD/tobacco use, alcohol and polysubstance use , diabetes mellitus, hypertension, hyperlipidemia, GERD, medical noncompliance, depression as well as CVA with residual left-sided weakness and legally blind.  Per chart review patient has been staying in a motel living alone.  Was using a rollator.  He uses the bus for transportation.  He goes to the shelter to eat.  Independent in self-care.  He does have a daughter in the area who he plans to stay with on discharge.  Presented 10/24/2024 with reports of increased left-sided weakness worsening vision and slurred speech as well as increasing headache that got progressively worse over 2 days.  Noted blood pressure 148/91.  CT/MRI/CTA showed no acute intracranial abnormality.  Remote left basal ganglia, left frontal and left PCA territory infarct.  CTA showed similar left M1 MCA occlusion with more distal reconstitution.  2.1 cm suspected right parotid mass with interval increase in abnormality heterogeneous, rounded and 1.1 cm right level 2 node.  Admission chemistries urine drug screen negative, sodium 134, chloride 97, glucose 467, alcohol 66, hemoglobin A1c 9.6.  Patient did not receive TNK.  Echocardiogram ejection fraction of 55 to 60% no wall motion abnormalities grade 1 diastolic dysfunction.  Follow-up ENT Dr. Norleen Notice for parotid mass and plan workup as outpatient with consideration for fine-needle aspiration.  Neurology follow-up currently maintained on aspirin  325 mg daily.  Lovenox  added for DVT prophylaxis.  CVA felt likely recrudescence of old stroke symptoms due to headache and hypertensive emergency.  Tolerating a regular diet.  Therapy evaluations completed due to patient decreased functional mobility was admitted for a comprehensive rehab  program.  Review of Systems  Constitutional:  Negative for chills and fever.  HENT:  Negative for hearing loss.   Eyes:  Positive for blurred vision. Negative for double vision.  Respiratory:  Negative for cough, shortness of breath and wheezing.   Cardiovascular:  Negative for chest pain, palpitations and leg swelling.  Gastrointestinal:  Positive for constipation. Negative for heartburn, nausea and vomiting.       GERD  Genitourinary:  Negative for dysuria, flank pain and hematuria.  Musculoskeletal:  Positive for myalgias.  Skin:  Negative for rash.  Neurological:  Positive for dizziness, weakness and headaches.  Psychiatric/Behavioral:  Positive for depression.   All other systems reviewed and are negative.  Past Medical History:  Diagnosis Date   AKI (acute kidney injury) 07/20/2022   Closed fracture dislocation of lumbar spine (HCC) 1987   COPD (chronic obstructive pulmonary disease) (HCC)    COVID-19 virus infection 07/20/2022   CVA (cerebral vascular accident) (HCC) 03/25/2022   Diabetes mellitus without complication (HCC)    GERD (gastroesophageal reflux disease)    High cholesterol    Homelessness 02/07/2019   Hypertension    History reviewed. No pertinent surgical history. History reviewed. No pertinent family history. Social History:  reports that he has been smoking cigarettes. He has never used smokeless tobacco. He reports current alcohol use of about 1.0 standard drink of alcohol per week. He reports that he does not currently use drugs. Allergies: Allergies[1] Medications Prior to Admission  Medication Sig Dispense Refill   Accu-Chek Softclix Lancets lancets Use to check blood sugar three times daily. 100 each 6   albuterol  (VENTOLIN  HFA) 108 (90 Base) MCG/ACT inhaler TAKE 2 PUFFS BY MOUTH  EVERY 6 HOURS AS NEEDED FOR WHEEZE OR SHORTNESS OF BREATH (Patient not taking: Reported on 10/26/2024) 18 each 2   amLODipine  (NORVASC ) 10 MG tablet Take 1 tablet (10 mg  total) by mouth daily. (Patient not taking: Reported on 10/26/2024) 30 tablet 0   aspirin  325 MG tablet Take 1 tablet (325 mg total) by mouth daily. (Patient not taking: Reported on 10/26/2024) 30 tablet 2   atorvastatin  (LIPITOR ) 80 MG tablet Take 1 tablet (80 mg total) by mouth every evening. (Patient not taking: Reported on 10/26/2024) 90 tablet 2   Blood Glucose Monitoring Suppl (ACCU-CHEK GUIDE) w/Device KIT Use to check blood sugar three times daily. 1 kit 0   carvedilol  (COREG ) 25 MG tablet Take 1 tablet (25 mg total) by mouth 2 (two) times daily with a meal. (Patient not taking: Reported on 10/26/2024) 120 tablet 3   cyanocobalamin  (VITAMIN B12) 1000 MCG tablet Take 1 tablet (1,000 mcg total) by mouth once daily. (Patient not taking: Reported on 10/26/2024) 130 tablet 2   cyclobenzaprine  (FLEXERIL ) 10 MG tablet Take 1 tablet (10 mg total) by mouth 3 (three) times daily as needed for muscle spasms. (Patient not taking: Reported on 10/26/2024) 90 tablet 0   dapagliflozin  propanediol (FARXIGA ) 10 MG TABS tablet Take 1 tablet (10 mg total) by mouth daily before breakfast. (Patient not taking: Reported on 10/26/2024) 30 tablet 2   Dulaglutide  (TRULICITY ) 1.5 MG/0.5ML SOPN Inject 1.5 mg into the skin once a week. (Patient not taking: Reported on 10/26/2024) 2 mL 4   fluticasone -salmeterol (ADVAIR  DISKUS) 100-50 MCG/ACT AEPB Inhale 1 puff into the lungs 2 (two) times daily. (Patient not taking: Reported on 10/26/2024) 60 each 2   glipiZIDE  (GLIPIZIDE  XL) 5 MG 24 hr tablet Take 1 tablet (5 mg total) by mouth daily with breakfast. (Patient not taking: Reported on 10/26/2024) 30 tablet 0   glucose blood (ACCU-CHEK GUIDE) test strip Use to check blood sugar three times daily. 100 each 6   insulin  glargine (LANTUS  SOLOSTAR) 100 UNIT/ML Solostar Pen Inject 30 Units into the skin daily. (Patient not taking: Reported on 10/26/2024) 15 mL 2   Insulin  Pen Needle (PEN NEEDLES) 31G X 5 MM MISC use as directed in  the morning, at noon, in the evening, and at bedtime. 130 each 1   metFORMIN  (GLUCOPHAGE ) 500 MG tablet Take 2 tablets (1,000 mg total) by mouth daily with breakfast. (Patient not taking: Reported on 10/26/2024) 120 tablet 1   methocarbamol  (ROBAXIN ) 500 MG tablet Take 1 tablet (500 mg total) by mouth 2 (two) times daily as needed for muscle spasms. (Patient not taking: Reported on 10/26/2024) 20 tablet 0   pantoprazole  (PROTONIX ) 40 MG tablet Take 1 tablet (40 mg total) by mouth once daily. (Patient not taking: Reported on 10/26/2024) 90 tablet 2   polyethylene glycol powder (GLYCOLAX /MIRALAX ) 17 GM/SCOOP powder Take 17 g by mouth 2 (two) times daily as needed. (Patient not taking: Reported on 10/26/2024) 1530 g 1   sertraline  (ZOLOFT ) 50 MG tablet Take 50 mg by mouth daily. (Patient not taking: Reported on 10/26/2024)     thiamine  (VITAMIN B1) 100 MG tablet Take 1 tablet (100 mg total) by mouth once daily. (Patient not taking: Reported on 10/26/2024) 100 tablet 0   valsartan -hydrochlorothiazide  (DIOVAN -HCT) 320-25 MG tablet Take 1 tablet by mouth once daily. (Patient not taking: Reported on 10/26/2024) 60 tablet 3      Home: Home Living Family/patient expects to be discharged to:: Unsure Available Help at Discharge: Family  Type of Home: Other(Comment) Home Access: Level entry Home Layout: One level Bathroom Shower/Tub: Engineer, Manufacturing Systems: Standard Bathroom Accessibility: Yes Additional Comments: Motel  Lives With: Other (Comment)   Functional History: Prior Function Prior Level of Function : Independent/Modified Independent Mobility Comments: walks with a rollator, takes the bus ADLs Comments: goes to the shelter to eat, independent in self care  Functional Status:  Mobility: Bed Mobility Overal bed mobility: Needs Assistance Bed Mobility: Rolling, Sidelying to Sit Rolling: Modified independent (Device/Increase time) Sidelying to sit: Contact guard assist, Used  rails Supine to sit: Min assist Sit to supine: Min assist General bed mobility comments: slight lightheadedness on sitting therefore CGA for safety Transfers Overall transfer level: Needs assistance Equipment used: Rolling walker (2 wheels) Transfers: Sit to/from Stand Sit to Stand: Contact guard assist Bed to/from chair/wheelchair/BSC transfer type:: Step pivot Step pivot transfers: +2 physical assistance, Min assist, Mod assist General transfer comment: able to place and maintain L hand on walker handle; vc to use RUE to push up from surface Ambulation/Gait Ambulation/Gait assistance: Min assist Gait Distance (Feet): 100 Feet Assistive device: Rolling walker (2 wheels) Gait Pattern/deviations: Step-to pattern, Decreased stride length, Knee flexed in stance - right, Knee flexed in stance - left, Knees buckling General Gait Details: light support due to quick release/buckling of bil Knees followed by catching and returning to upright; LLE slightly worse than RLE; did not improve or worsen with incr distance/time Gait velocity: decr    ADL: ADL Overall ADL's : Needs assistance/impaired Eating/Feeding: Set up, Sitting Grooming: Wash/dry hands, Wash/dry face, Sitting, Set up, Brushing hair Upper Body Bathing: Moderate assistance, Sitting Lower Body Bathing: +2 for physical assistance, Sit to/from stand, Maximal assistance Upper Body Dressing : Moderate assistance, Sitting Lower Body Dressing: Total assistance, Sitting/lateral leans Lower Body Dressing Details (indicate cue type and reason): for shoes Functional mobility during ADLs: +2 for physical assistance, Minimal assistance, Moderate assistance, Rolling walker (2 wheels)  Cognition: Cognition Overall Cognitive Status: Within Functional Limits for tasks assessed (suspect near baseline, will need further assessment of higher level function) Orientation Level: Oriented X4 Cognition Arousal: Alert Behavior During Therapy: WFL  for tasks assessed/performed Overall Cognitive Status: Within Functional Limits for tasks assessed (suspect near baseline, will need further assessment of higher level function)  Physical Exam: Blood pressure 123/76, pulse 73, temperature 98.7 F (37.1 C), temperature source Oral, resp. rate 20, height 5' 5 (1.651 m), weight 82 kg, SpO2 97%. Physical Exam Neurological:     Comments: Patient is alert.  Follows verbal commands.  He is legally blind.  Provides name and age.  Limited but fair medical historian.     Results for orders placed or performed during the hospital encounter of 10/24/24 (from the past 48 hours)  Glucose, capillary     Status: Abnormal   Collection Time: 10/28/24  4:05 PM  Result Value Ref Range   Glucose-Capillary 187 (H) 70 - 99 mg/dL    Comment: Glucose reference range applies only to samples taken after fasting for at least 8 hours.  Urinalysis, Routine w reflex microscopic -Urine, Clean Catch     Status: Abnormal   Collection Time: 10/28/24  4:34 PM  Result Value Ref Range   Color, Urine YELLOW YELLOW   APPearance CLEAR CLEAR   Specific Gravity, Urine 1.011 1.005 - 1.030   pH 5.0 5.0 - 8.0   Glucose, UA >=500 (A) NEGATIVE mg/dL   Hgb urine dipstick SMALL (A) NEGATIVE   Bilirubin Urine NEGATIVE NEGATIVE  Ketones, ur NEGATIVE NEGATIVE mg/dL   Protein, ur 30 (A) NEGATIVE mg/dL   Nitrite NEGATIVE NEGATIVE   Leukocytes,Ua NEGATIVE NEGATIVE   RBC / HPF 0-5 0 - 5 RBC/hpf   WBC, UA 0-5 0 - 5 WBC/hpf   Bacteria, UA NONE SEEN NONE SEEN   Squamous Epithelial / HPF 0-5 0 - 5 /HPF   Mucus PRESENT    Hyaline Casts, UA PRESENT     Comment: Performed at [facility], [address] [place], [place] [address]  Glucose, capillary     Status: Abnormal   Collection Time: 10/28/24  9:03 PM  Result Value Ref Range   Glucose-Capillary 256 (H) 70 - 99 mg/dL    Comment: Glucose reference range applies only to samples taken after fasting for at least 8 hours.    Comment 1 Notify RN    Comment 2 Document in Chart   CBC with Differential/Platelet     Status: Abnormal   Collection Time: 10/29/24  2:00 AM  Result Value Ref Range   WBC 9.1 4.0 - 10.5 K/uL   RBC 5.17 4.22 - 5.81 MIL/uL   Hemoglobin 15.5 13.0 - 17.0 g/dL   HCT 54.5 60.9 - 47.9 %   MCV 87.8 80.0 - 100.0 fL   MCH 30.0 26.0 - 34.0 pg   MCHC 34.1 30.0 - 36.0 g/dL   RDW 86.8 88.4 - 84.4 %   Platelets 204 150 - 400 K/uL   nRBC 0.0 0.0 - 0.2 %   Neutrophils Relative % 59 %   Neutro Abs 5.5 1.7 - 7.7 K/uL   Lymphocytes Relative 26 %   Lymphs Abs 2.3 0.7 - 4.0 K/uL   Monocytes Relative 12 %   Monocytes Absolute 1.1 (H) 0.1 - 1.0 K/uL   Eosinophils Relative 2 %   Eosinophils Absolute 0.1 0.0 - 0.5 K/uL   Basophils Relative 0 %   Basophils Absolute 0.0 0.0 - 0.1 K/uL   Immature Granulocytes 1 %   Abs Immature Granulocytes 0.05 0.00 - 0.07 K/uL    Comment: Performed at [facility], [address], [place], [place] [address]  Comprehensive metabolic panel with GFR     Status: Abnormal   Collection Time: 10/29/24  2:00 AM  Result Value Ref Range   Sodium 138 135 - 145 mmol/L   Potassium 3.3 (L) 3.5 - 5.1 mmol/L   Chloride 98 98 - 111 mmol/L   CO2 27 22 - 32 mmol/L   Glucose, Bld 162 (H) 70 - 99 mg/dL    Comment: Glucose reference range applies only to samples taken after fasting for at least 8 hours.   BUN 29 (H) 6 - 20 mg/dL   Creatinine, Ser 8.37 (H) 0.61 - 1.24 mg/dL   Calcium  8.7 (L) 8.9 - 10.3 mg/dL   Total Protein 6.0 (L) 6.5 - 8.1 g/dL   Albumin 3.2 (L) 3.5 - 5.0 g/dL   AST 17 15 - 41 U/L   ALT 23 0 - 44 U/L   Alkaline Phosphatase 71 38 - 126 U/L   Total Bilirubin 0.8 0.0 - 1.2 mg/dL   GFR, Estimated 49 (L) >60 mL/min    Comment: (NOTE) Calculated using the CKD-EPI Creatinine Equation (2021)    Anion gap 13 5 - 15    Comment: Performed at [facility], [address], [place], [place] [address]  Magnesium      Status: None   Collection Time: 10/29/24  2:00  AM  Result Value Ref Range   Magnesium  2.1 1.7 - 2.4 mg/dL    Comment: Performed at [facility], [address], [place], [place] [address]  Phosphorus     Status: None   Collection Time: 10/29/24  2:00 AM  Result Value Ref Range   Phosphorus 3.9 2.5 - 4.6 mg/dL    Comment: Performed at [facility], [address] [place], [place] [address]  Glucose, capillary     Status: Abnormal   Collection Time: 10/29/24  6:20 AM  Result Value Ref Range   Glucose-Capillary 178 (H) 70 - 99 mg/dL    Comment: Glucose reference range applies only to samples taken after fasting for at least 8 hours.   Comment 1 Notify RN    Comment 2 Document in Chart   Glucose, capillary     Status: Abnormal   Collection Time: 10/29/24 11:35 AM  Result Value Ref Range   Glucose-Capillary 275 (H) 70 - 99 mg/dL    Comment: Glucose reference range applies only to samples taken after fasting for at least 8 hours.  Glucose, capillary     Status: Abnormal   Collection Time: 10/29/24  4:19 PM  Result Value Ref Range   Glucose-Capillary 192 (H) 70 - 99 mg/dL    Comment: Glucose reference range applies only to samples taken after fasting for at least 8 hours.  Glucose, capillary     Status: Abnormal   Collection Time: 10/29/24  9:18 PM  Result Value Ref Range   Glucose-Capillary 247 (H) 70 - 99 mg/dL    Comment: Glucose reference range applies only to samples taken after fasting for at least 8 hours.  Basic metabolic panel with GFR     Status: Abnormal   Collection Time: 10/30/24  2:27 AM  Result Value Ref Range   Sodium 137 135 - 145 mmol/L   Potassium 4.1 3.5 - 5.1 mmol/L    Comment: HEMOLYSIS AT THIS LEVEL MAY AFFECT RESULT   Chloride 100 98 - 111 mmol/L   CO2 25 22 - 32 mmol/L   Glucose, Bld 155 (H) 70 - 99 mg/dL    Comment: Glucose reference range applies only to samples taken after fasting for at least 8 hours.   BUN 25 (H) 6 - 20 mg/dL   Creatinine, Ser 8.62 (H) 0.61 - 1.24 mg/dL   Calcium  9.4  8.9 - 10.3 mg/dL   GFR, Estimated 59 (L) >60 mL/min    Comment: (NOTE) Calculated using the CKD-EPI Creatinine Equation (2021)    Anion gap 13 5 - 15    Comment: Performed at [facility], [address] [place], [place] [address]  CBC     Status: None   Collection Time: 10/30/24  2:27 AM  Result Value Ref Range   WBC 8.6 4.0 - 10.5 K/uL   RBC 5.46 4.22 - 5.81 MIL/uL   Hemoglobin 16.5 13.0 - 17.0 g/dL   HCT 51.1 60.9 - 47.9 %   MCV 89.4 80.0 - 100.0 fL   MCH 30.2 26.0 - 34.0 pg   MCHC 33.8 30.0 - 36.0 g/dL   RDW 86.7 88.4 - 84.4 %   Platelets 200 150 - 400 K/uL   nRBC 0.0 0.0 - 0.2 %    Comment: Performed at [facility], [address], [place], [place] [address]  Magnesium      Status: None   Collection Time: 10/30/24  2:27 AM  Result Value Ref Range  Magnesium  2.2 1.7 - 2.4 mg/dL    Comment: Performed at [facility], [address] [place], [place] [address]  Phosphorus     Status: None   Collection Time: 10/30/24  2:27 AM  Result Value Ref Range   Phosphorus 3.3 2.5 - 4.6 mg/dL    Comment: Performed at [facility], [address] [place], [place] [address]  Glucose, capillary     Status: Abnormal   Collection Time: 10/30/24  6:25 AM  Result Value Ref Range   Glucose-Capillary 148 (H) 70 - 99 mg/dL    Comment: Glucose reference range applies only to samples taken after fasting for at least 8 hours.  Glucose, capillary     Status: Abnormal   Collection Time: 10/30/24  7:53 AM  Result Value Ref Range   Glucose-Capillary 145 (H) 70 - 99 mg/dL    Comment: Glucose reference range applies only to samples taken after fasting for at least 8 hours.  Glucose, capillary     Status: Abnormal   Collection Time: 10/30/24 11:09 AM  Result Value Ref Range   Glucose-Capillary 216 (H) 70 - 99 mg/dL    Comment: Glucose reference range applies only to samples taken after fasting for at least 8 hours.   No results found.    Blood pressure 123/76, pulse  73, temperature 98.7 F (37.1 C), temperature source Oral, resp. rate 20, height 5' 5 (1.651 m), weight 82 kg, SpO2 97%.  Medical Problem List and Plan: 1. Functional deficits secondary to likely recrudescence of old stroke symptoms(remote left basal ganglia, left frontal and left PCA territory infarct) due to headache and hypertensive emergency  -patient may *** shower  -ELOS/Goals: *** 2.  Antithrombotics: -DVT/anticoagulation:  Pharmaceutical: Lovenox   -antiplatelet therapy: Aspirin  325 mg daily 3. Pain Management: Tylenol  as needed 4. Mood/Behavior/Sleep: Zoloft  50 mg daily  -antipsychotic agents: N/A 5. Neuropsych/cognition: This patient is capable of making decisions on his own behalf. 6. Skin/Wound Care: Routine skin checks 7. Fluids/Electrolytes/Nutrition: Routine ins and outs with follow-up chemistries 8.  Right parotid mass.  Follow-up outpatient Dr. Roark for needle aspiration 9.  Hypertension.  Norvasc  10 mg daily, Coreg  25 mg twice daily.  Monitor with increased mobility 10.  Hyperlipidemia.  Lipitor  11.  Diabetes mellitus.  Hemoglobin A1c 9.6.  Lantus  insulin  36 units daily, NovoLog  6 units 3 times daily 12.  History of tobacco alcohol and polysubstance abuse.  Urine drug screen negative.  NicoDerm patch.  Provide counseling 13.  GERD.  Protonix  14.  Medical noncompliance.  Counseling 15.  Constipation.  MiraLAX  twice daily  Toribio JINNY Pitch, PA-C 10/30/2024     [1]  Allergies Allergen Reactions   Fish Allergy Anaphylaxis, Other (See Comments) and Swelling    Throat swells   Penicillins Anaphylaxis, Swelling and Other (See Comments)    Throat swells   Shellfish Allergy Anaphylaxis, Swelling and Other (See Comments)    Throat swells

## 2024-10-30 NOTE — TOC Transition Note (Signed)
 Transition of Care [facility]) - Discharge Note   Patient Details  Name: Stanley Stone MRN: [id number] Date of Birth: [birth year]  [facility] CM/SW Contact:  Andrez JULIANNA George, RN Phone Number: 10/30/2024, 12:46 PM   Clinical Narrative:     Pt is discharging to CIR today. CM signing off.  Final next level of care: IP Rehab Facility Barriers to Discharge: No Barriers Identified   Patient Goals and CMS Choice   CMS Medicare.gov Compare Post Acute Care list provided to:: Patient Choice offered to / list presented to : Patient      Discharge Placement                       Discharge Plan and Services Additional resources added to the After Visit Summary for   In-house Referral: Clinical Social Work Discharge Planning Services: CM Consult Post Acute Care Choice: IP Rehab                               Social Drivers of Health (SDOH) Interventions SDOH Screenings   Food Insecurity: Food Insecurity Present (10/25/2024)  Housing: Unknown (10/29/2024)  Recent Concern: Housing - High Risk (10/25/2024)  Transportation Needs: Unmet Transportation Needs (10/25/2024)  Utilities: At Risk (10/25/2024)  Depression (PHQ2-9): Low Risk (02/28/2023)  Financial Resource Strain: Not on File (09/12/2023)   Received from [facility]  Physical Activity: Not on File (09/12/2023)   Received from [facility]  Social Connections: Not on File (09/12/2023)   Received from [facility]  Stress: Not on File (09/12/2023)   Received from [facility]  Tobacco Use: High Risk (10/24/2024)     Readmission Risk Interventions     No data to display

## 2024-10-30 NOTE — Discharge Summary (Addendum)
 Physician Discharge Summary  Ruhaan Nordahl FMW:[id number] DOB: 1965-12-19 DOA: 10/24/2024  PCP: [facility]  Admit date: 10/24/2024 Discharge date: 10/30/2024  Time spent: 40 minutes  Recommendations for Outpatient Follow-up:  Follow outpatient CBC/CMP  Follow with neurology outpatient Follow blood pressure outpatient Follow diabetes regimen Follow parotid mass outpatient   Discharge Diagnoses:  Principal Problem:   Hypertensive emergency Active Problems:   TIA (transient ischemic attack)   Discharge Condition: stable  Diet recommendation: heart healthy, diabetes  Filed Weights   10/24/24 2137  Weight: 82 kg    History of present illness:   58 y.o. male with medical history significant of prior CVA, COPD, diabetes, hypertension, hyperlipidemia who presented emergency department due to slurred speech, vision changes and left-sided weakness.   CTA head and neck showed carotid mass and severe stenosis of vertebral artery. MRI brain showed no acute infarcts.   Neurology was consulted and suspected recrudescence of old stroke symptoms due to hypertensive emergncy.  Recommended continue statin/aspirin .   Hospital Course:  Assessment and Plan:  # Slurred speech and weakness most likely secondary to recrudescence of prior stroke due to hypertensive emergency - MRI negative for acute stroke -remote L basal ganglia, L frontal and L PCA territory infarcts -- CTA head/neck with severe L M1 MCA occlusion with more distal reconstitution, similar severe stenosis of the proximal L V2 vertebral artery - Neurology consulted - Suspected recrudescence of old stroke sx due to HA and hypertensive emergency.  Continue statin, aspirin .    # AKI  - mild, follow UA - 30 mg/dl protein, 0-5 RBC. - improving, continue to trend - holding thiazide, arb   # Hypokalemia - replace, follow   # Parotid mass -2.1 cm right parotid mass with interval increase in  abnormally heterogeneous rounded 1.1 cm right level 2 node concerning with primary parotid neoplasm with progressive nodal metastasis.   ENT consulted, Dr. Roark saw the patient.  Plan for workup of parotid mass as outpatient - follow with ENT outpatient.   # Dysphagia -SLP recommending regular, thin liquids   # Hypertension -continue amlodipine , carvedilol  - BP currently ok without arb/thiazide, follow  - irbesartan , hydrochlorothiazide  on hold with AKI   # GERD -continue Protonix    # Depression -continue Zoloft    # Diabetes mellitus type 2 - A1c 9.6 - BG's uncontrolled - adjust insulin  - basal, bolus, ssi - carb mod diet - continue to adjust regimen as needed in CIR   # Legally Blind  - noted    # Obesity Body mass index is 30.08 kg/m.     Procedures:  Echo IMPRESSIONS     1. Left ventricular ejection fraction, by estimation, is 55 to 60%. Left  ventricular ejection fraction by 2D MOD biplane is 60.1 %. The left  ventricle has normal function. The left ventricle has no regional wall  motion abnormalities. There is moderate  concentric left ventricular hypertrophy. Left ventricular diastolic  parameters are consistent with Grade I diastolic dysfunction (impaired  relaxation).   2. Right ventricular systolic function is normal. The right ventricular  size is normal. Tricuspid regurgitation signal is inadequate for assessing  PA pressure.   3. Left atrial size was mildly dilated.   4. The mitral valve is normal in structure. No evidence of mitral valve  regurgitation. No evidence of mitral stenosis.   5. The aortic valve is tricuspid. Aortic valve regurgitation is mild. No  aortic stenosis is present.   6. Aortic dilatation noted.  There is moderate dilatation of the ascending  aorta, measuring 44 mm.   7. The inferior vena cava is normal in size with greater than 50%  respiratory variability, suggesting right atrial pressure of 3 mmHg.    Consultations: neurology  Discharge Exam: Vitals:   10/30/24 0730 10/30/24 1122  BP: 134/70 123/76  Pulse: 69 73  Resp: 16 20  Temp: 98.3 F (36.8 C) 98.7 F (37.1 C)  SpO2: 95% 97%   See progress note Stable for discharge  Discharge Instructions   Discharge Instructions     Ambulatory referral to Neurology   Complete by: As directed    Follow up with stroke clinic NP at [facility] in about 4-6 weeks. Thanks.   Call MD for:  difficulty breathing, headache or visual disturbances   Complete by: As directed    Call MD for:  extreme fatigue   Complete by: As directed    Call MD for:  hives   Complete by: As directed    Call MD for:  persistant dizziness or light-headedness   Complete by: As directed    Call MD for:  persistant nausea and vomiting   Complete by: As directed    Call MD for:  redness, tenderness, or signs of infection (pain, swelling, redness, odor or green/yellow discharge around incision site)   Complete by: As directed    Call MD for:  severe uncontrolled pain   Complete by: As directed    Call MD for:  temperature >100.4   Complete by: As directed    Discharge instructions   Complete by: As directed    You were seen for the recrudescence (reappearance of old stroke symptoms) of stroke symptoms in the setting of high blood pressure.  You've been seen by neurology.  They recommend continuing aspirin  and your statin.  It's important that you continue your insulin  and adjust your diabetes regimen with the help of your PCP outpatient.  I've stopped your glipizide .    We've adjusted your blood pressure medicines as well.    You have Evvie Behrmann parotid mass that warrants outpatient ENT follow up for Jaycee Pelzer biopsy.  I'll place the office number in your discharge paperwork.  Call for an appointment with Dr. Roark outpatient.    Return for new, recurrent, or worsening symptoms.  Please ask your PCP to request records from this hospitalization so they know what was done and  what the next steps will be.   Increase activity slowly   Complete by: As directed       Allergies as of 10/30/2024       Reactions   Fish Allergy Anaphylaxis, Other (See Comments), Swelling   Throat swells   Penicillins Anaphylaxis, Swelling, Other (See Comments)   Throat swells   Shellfish Allergy Anaphylaxis, Swelling, Other (See Comments)   Throat swells        Medication List     STOP taking these medications    cyclobenzaprine  10 MG tablet Commonly known as: FLEXERIL    glipiZIDE  5 MG 24 hr tablet Commonly known as: glipiZIDE  XL   Lantus  SoloStar 100 UNIT/ML Solostar Pen Generic drug: insulin  glargine Replaced by: insulin  glargine 100 UNIT/ML injection   methocarbamol  500 MG tablet Commonly known as: ROBAXIN    valsartan -hydrochlorothiazide  320-25 MG tablet Commonly known as: DIOVAN -HCT       TAKE these medications    Accu-Chek Guide test strip Generic drug: glucose blood Use to check blood sugar three times daily.   Accu-Chek Guide w/Device Kit  Use to check blood sugar three times daily.   Accu-Chek Softclix Lancets lancets Use to check blood sugar three times daily.   Advair  Diskus 100-50 MCG/ACT Aepb Generic drug: fluticasone -salmeterol Inhale 1 puff into the lungs 2 (two) times daily.   amLODipine  10 MG tablet Commonly known as: NORVASC  Take 1 tablet (10 mg total) by mouth daily.   aspirin  325 MG tablet Take 1 tablet (325 mg total) by mouth daily.   atorvastatin  80 MG tablet Commonly known as: LIPITOR  Take 1 tablet (80 mg total) by mouth every evening.   carvedilol  25 MG tablet Commonly known as: COREG  Take 1 tablet (25 mg total) by mouth 2 (two) times daily with Edouard Gikas meal.   cyanocobalamin  1000 MCG tablet Commonly known as: VITAMIN B12 Take 1 tablet (1,000 mcg total) by mouth once daily.   dapagliflozin  propanediol 10 MG Tabs tablet Commonly known as: Farxiga  Take 1 tablet (10 mg total) by mouth daily before breakfast.   insulin   aspart 100 UNIT/ML injection Commonly known as: novoLOG  Inject 0-20 Units into the skin 3 (three) times daily with meals. CBG < 70: treat low blood sugars  CBG 70 - 120: 0 units  CBG 121 - 150: 3 units  CBG 151 - 200: 4 units  CBG 201 - 250: 7 units  CBG 251 - 300: 11 units  CBG 301 - 350: 15 units  CBG 351 - 400: 20 units  CBG > 400: call MD   insulin  glargine 100 UNIT/ML injection Commonly known as: LANTUS  Inject 0.36 mLs (36 Units total) into the skin daily. Start taking on: October 31, 2024 Replaces: Lantus  SoloStar 100 UNIT/ML Solostar Pen   metFORMIN  500 MG tablet Commonly known as: GLUCOPHAGE  Take 2 tablets (1,000 mg total) by mouth daily with breakfast.   pantoprazole  40 MG tablet Commonly known as: PROTONIX  Take 1 tablet (40 mg total) by mouth once daily.   Pen Needles 31G X 5 MM Misc use as directed in the morning, at noon, in the evening, and at bedtime.   polyethylene glycol powder 17 GM/SCOOP powder Commonly known as: GLYCOLAX /MIRALAX  Take 17 g by mouth 2 (two) times daily as needed.   sertraline  50 MG tablet Commonly known as: ZOLOFT  Take 50 mg by mouth daily.   thiamine  100 MG tablet Commonly known as: VITAMIN B1 Take 1 tablet (100 mg total) by mouth once daily.   Trulicity  1.5 MG/0.5ML Soaj Generic drug: Dulaglutide  Inject 1.5 mg into the skin once Demon Volante week.   Ventolin  HFA 108 (90 Base) MCG/ACT inhaler Generic drug: albuterol  TAKE 2 PUFFS BY MOUTH EVERY 6 HOURS AS NEEDED FOR WHEEZE OR SHORTNESS OF BREATH       Allergies[1]  Follow-up Information     Roark Rush, MD. Schedule an appointment as soon as possible for Lynda Capistran visit in 1 week(s).   Specialty: Otolaryngology Why: Call for Elvis Laufer follwo up appointment for your parotid mass Contact information: [address], [address] [place] [address] [phone]         [facility]. Schedule an appointment as soon as possible for Gwin Eagon visit in 1 month(s).    Specialty: Neurology Why: stroke clinic Contact information: [address] [place]  [address] [phone]                 The results of significant diagnostics from this hospitalization (including imaging, microbiology, ancillary and laboratory) are listed below for reference.    Significant Diagnostic Studies: DG  Swallowing Func-Speech Pathology Result Date: 10/25/2024 Table formatting from the original result was not included. Modified Barium Swallow Study Patient Details Name: Glennon Kopko MRN: [id number] Date of Birth: January 05, 1966 Today's Date: 10/25/2024 HPI/PMH: HPI: Mr. Minner is Brihana Quickel 58 yo male presenting 12/11 with two days of worsening L weakness, worsening vision, and slurred speech. No acute abnormality per radiology report, but per neurology note, there is Evalene Vath punctate central hyperintensity at the pontomedullary junction. CTA Head/Neck revealed Jemarcus Dougal R parotid mass concerning for neoplasm. Previous MBS in July 2025 revealed oropharyngeal deficits and suspected esophageal component but airway protection was complete. Regular diet and thin liquids were recommended. SLP services throughout that admission otherwise focused on communication/fluency. PMH includes: stroke with residual L weakness and bilateral vision loss, DM, HTN, hypercholesterolemia, COPD, AKI, medication noncompliance, EtOH abuse, polysubstance abuse, unhoused, GERD Clinical Impression: Pt has grossly functional oropharyngeal swallowing, exhibiting aspiration only x1 on initial sip. Suspect some of the coughing during intake at home may be related to more impulsive intake than could be observed during this study. Recommend starting regular solids and thin liquids but reinforced the importance of pacing. Pt attributes aspiration event to being surprised by the taste, but also denied any sensation of aspiration occurring despite cough response (PAS 7, aspirates not cleared by cough). Throughout the  remainder of testing, he had good efficiency and safety with only intermittent, transient penetration of thin liquids, which is considered to be normal (PAS 2). Note that pt had occasional, subtle coughing throughout testing that was not related to airway protection. Question if it could be esophageal, given retention and retrograde flow observed during brief scan. Attempted to get pt to take larger volumes at Avantae Bither faster rate given report of frequent coughing at home, but he would not do so given aversion to barium; however, he acknowledges that coughing spells at home are often when he is drinking Colon Rueth lot very quickly. Factors that may increase risk of adverse event in presence of aspiration Noe & Lianne 2021): Factors that may increase risk of adverse event in presence of aspiration Noe & Lianne 2021): Poor general health and/or compromised immunity; Weak cough Recommendations/Plan: Swallowing Evaluation Recommendations Swallowing Evaluation Recommendations Recommendations: PO diet PO Diet Recommendation: Regular; Thin liquids (Level 0) Liquid Administration via: Cup; Straw Medication Administration: Whole meds with liquid Supervision: Patient able to self-feed; Set-up assistance for safety Swallowing strategies  : Slow rate; Small bites/sips Postural changes: Position pt fully upright for meals; Stay upright 30-60 min after meals Oral care recommendations: Oral care BID (2x/day) Recommended consults: Consider ENT consultation Treatment Plan Treatment Plan Treatment recommendations: Therapy as outlined in treatment plan below Follow-up recommendations: Skilled nursing-short term rehab (<3 hours/day) Functional status assessment: Patient has had Neidy Guerrieri recent decline in their functional status and demonstrates the ability to make significant improvements in function in Todd Jelinski reasonable and predictable amount of time. Treatment frequency: Min 2x/week Treatment duration: 2 weeks Interventions: Aspiration precaution  training; Compensatory techniques; Patient/family education; Diet toleration management by SLP Recommendations Recommendations for follow up therapy are one component of Bridey Brookover multi-disciplinary discharge planning process, led by the attending physician.  Recommendations may be updated based on patient status, additional functional criteria and insurance authorization. Assessment: Orofacial Exam: Orofacial Exam Oral Cavity: Oral Hygiene: WFL Oral Cavity - Dentition: Adequate natural dentition; Missing dentition Orofacial Anatomy: WFL Anatomy: Anatomy: WFL Boluses Administered: Boluses Administered Boluses Administered: Thin liquids (Level 0); Mildly thick liquids (Level 2, nectar thick); Moderately thick liquids (Level 3, honey thick); Puree; Solid  Oral Impairment Domain: Oral Impairment Domain Lip Closure: Interlabial escape, no progression to anterior lip Tongue control during bolus hold: Cohesive bolus between tongue to palatal seal Bolus preparation/mastication: Timely and efficient chewing and mashing Bolus transport/lingual motion: Brisk tongue motion Oral residue: Residue collection on oral structures Location of oral residue : Tongue Initiation of pharyngeal swallow : Valleculae  Pharyngeal Impairment Domain: Pharyngeal Impairment Domain Soft palate elevation: No bolus between soft palate (SP)/pharyngeal wall (PW) Laryngeal elevation: Complete superior movement of thyroid  cartilage with complete approximation of arytenoids to epiglottic petiole Anterior hyoid excursion: Complete anterior movement Epiglottic movement: Complete inversion Laryngeal vestibule closure: Complete, no air/contrast in laryngeal vestibule Pharyngeal stripping wave : Present - complete Pharyngeal contraction (Teige Rountree/P view only): N/Kristal Perl Pharyngoesophageal segment opening: Complete distension and complete duration, no obstruction of flow Tongue base retraction: No contrast between tongue base and posterior pharyngeal wall (PPW) Pharyngeal residue:  Complete pharyngeal clearance Location of pharyngeal residue: N/Chandan Fly  Esophageal Impairment Domain: Esophageal Impairment Domain Esophageal clearance upright position: Esophageal retention with retrograde flow below pharyngoesophageal segment (PES) Pill: Pill Consistency administered: Thin liquids (Level 0) Thin liquids (Level 0): [facility] Penetration/Aspiration Scale Score: Penetration/Aspiration Scale Score 1.  Material does not enter airway: Mildly thick liquids (Level 2, nectar thick); Moderately thick liquids (Level 3, honey thick); Puree; Solid; Pill 7.  Material enters airway, passes BELOW cords and not ejected out despite cough attempt by patient: Thin liquids (Level 0) Compensatory Strategies: No data recorded  General Information: Caregiver present: No  Diet Prior to this Study: NPO   Temperature : Normal   Respiratory Status: WFL   Supplemental O2: None (Room air)   History of Recent Intubation: No  Behavior/Cognition: Alert; Cooperative; Pleasant mood Self-Feeding Abilities: Able to self-feed Baseline vocal quality/speech: Dysphonic Volitional Cough: Able to elicit Volitional Swallow: Able to elicit Exam Limitations: No limitations Goal Planning: Prognosis for improved oropharyngeal function: Good No data recorded No data recorded Patient/Family Stated Goal: wants food Consulted and agree with results and recommendations: Patient Pain: Pain Assessment Pain Assessment: Faces Pain Score: 4 Faces Pain Scale: 0 Pain Location: head, back Pain Descriptors / Indicators: Aching Pain Intervention(s): Monitored during session; Repositioned End of Session: Start Time:SLP Start Time (ACUTE ONLY): 1514 Stop Time: SLP Stop Time (ACUTE ONLY): 1529 Time Calculation:SLP Time Calculation (min) (ACUTE ONLY): 15 min Charges: SLP Evaluations $ SLP Speech Visit: 1 Visit SLP Evaluations $BSS Swallow: 1 Procedure $MBS Swallow: 1 Procedure $ SLP EVAL LANGUAGE/SOUND PRODUCTION: 1 Procedure SLP visit diagnosis: SLP Visit Diagnosis:  Dysphagia, unspecified (R13.10) Past Medical History: Past Medical History: Diagnosis Date  AKI (acute kidney injury) 07/20/2022  Closed fracture dislocation of lumbar spine (HCC) 1987  COPD (chronic obstructive pulmonary disease) (HCC)   COVID-19 virus infection 07/20/2022  CVA (cerebral vascular accident) (HCC) 03/25/2022  Diabetes mellitus without complication (HCC)   GERD (gastroesophageal reflux disease)   High cholesterol   Homelessness 02/07/2019  Hypertension  Past Surgical History: No past surgical history on file. Leita SAILOR., M.Naksh Radi. CCC-SLP Acute Rehabilitation Services Office: [phone] Secure chat preferred 10/25/2024, 3:57 PM  ECHOCARDIOGRAM COMPLETE Result Date: 10/25/2024    ECHOCARDIOGRAM REPORT   Patient Name:   YAN PANKRATZ Date of Exam: 10/25/2024 Medical Rec #:  [id number]        Height:       65.0 in Accession #:    [id number]       Weight:       180.8 lb Date of Birth:  [birth year]  BSA:          1.895 m Patient Age:    58 years         BP:           152/87 mmHg Patient Gender: M                HR:           74 bpm. Exam Location:  Inpatient Procedure: 2D Echo, Cardiac Doppler, Color Doppler and Intracardiac            Opacification Agent (Both Spectral and Color Flow Doppler were            utilized during procedure). Indications:    Stroke I63.9  History:        Patient has prior history of Echocardiogram examinations, most                 recent 03/27/2022.  Sonographer:    Tinnie Gosling RDCS Referring Phys: [id number] ROBERT DORRELL IMPRESSIONS  1. Left ventricular ejection fraction, by estimation, is 55 to 60%. Left ventricular ejection fraction by 2D MOD biplane is 60.1 %. The left ventricle has normal function. The left ventricle has no regional wall motion abnormalities. There is moderate concentric left ventricular hypertrophy. Left ventricular diastolic parameters are consistent with Grade I diastolic dysfunction (impaired relaxation).  2. Right ventricular systolic function  is normal. The right ventricular size is normal. Tricuspid regurgitation signal is inadequate for assessing PA pressure.  3. Left atrial size was mildly dilated.  4. The mitral valve is normal in structure. No evidence of mitral valve regurgitation. No evidence of mitral stenosis.  5. The aortic valve is tricuspid. Aortic valve regurgitation is mild. No aortic stenosis is present.  6. Aortic dilatation noted. There is moderate dilatation of the ascending aorta, measuring 44 mm.  7. The inferior vena cava is normal in size with greater than 50% respiratory variability, suggesting right atrial pressure of 3 mmHg. FINDINGS  Left Ventricle: Left ventricular ejection fraction, by estimation, is 55 to 60%. Left ventricular ejection fraction by 2D MOD biplane is 60.1 %. The left ventricle has normal function. The left ventricle has no regional wall motion abnormalities. Definity  contrast agent was given IV to delineate the left ventricular endocardial borders. The left ventricular internal cavity size was normal in size. There is moderate concentric left ventricular hypertrophy. Left ventricular diastolic parameters are  consistent with Grade I diastolic dysfunction (impaired relaxation). Right Ventricle: The right ventricular size is normal. No increase in right ventricular wall thickness. Right ventricular systolic function is normal. Tricuspid regurgitation signal is inadequate for assessing PA pressure. Left Atrium: Left atrial size was mildly dilated. Right Atrium: Right atrial size was normal in size. Pericardium: There is no evidence of pericardial effusion. Mitral Valve: The mitral valve is normal in structure. No evidence of mitral valve regurgitation. No evidence of mitral valve stenosis. Tricuspid Valve: The tricuspid valve is normal in structure. Tricuspid valve regurgitation is not demonstrated. Aortic Valve: The aortic valve is tricuspid. Aortic valve regurgitation is mild. No aortic stenosis is present.  Pulmonic Valve: The pulmonic valve was normal in structure. Pulmonic valve regurgitation is not visualized. Aorta: Aortic dilatation noted. There is moderate dilatation of the ascending aorta, measuring 44 mm. Venous: The inferior vena cava is normal in size with greater than 50% respiratory variability, suggesting right atrial pressure of 3 mmHg. IAS/Shunts: No atrial level shunt detected by color flow Doppler.  LEFT VENTRICLE PLAX 2D  Biplane EF (MOD) LVIDd:         4.30 cm         LV Biplane EF:   Left LVIDs:         2.90 cm                          ventricular LV PW:         1.40 cm                          ejection LV IVS:        1.40 cm                          fraction by LVOT diam:     2.30 cm                          2D MOD LV SV:         75                               biplane is LV SV Index:   39                               60.1 %. LVOT Area:     4.15 cm LV IVRT:       143 msec        Diastology                                LV e' medial:    4.90 cm/s                                LV E/e' medial:  11.4 LV Volumes (MOD)               LV e' lateral:   8.49 cm/s LV vol d, MOD    103.0 ml      LV E/e' lateral: 6.6 A2C: LV vol d, MOD    128.0 ml A4C: LV vol s, MOD    45.3 ml A2C: LV vol s, MOD    43.5 ml A4C: LV SV MOD A2C:   57.7 ml LV SV MOD A4C:   128.0 ml LV SV MOD BP:    70.1 ml RIGHT VENTRICLE             IVC RV S prime:     12.50 cm/s  IVC diam: 2.00 cm TAPSE (M-mode): 2.4 cm                             PULMONARY VEINS                             Diastolic Velocity: 36.30 cm/s                             S/D Velocity:       1.10  Systolic Velocity:  41.30 cm/s LEFT ATRIUM             Index        RIGHT ATRIUM           Index LA diam:        3.50 cm 1.85 cm/m   RA Area:     13.90 cm LA Vol (A2C):   71.6 ml 37.78 ml/m  RA Volume:   31.80 ml  16.78 ml/m LA Vol (A4C):   54.3 ml 28.65 ml/m LA Biplane Vol: 66.2 ml 34.93 ml/m  AORTIC VALVE LVOT Vmax:    81.40 cm/s LVOT Vmean:  62.500 cm/s LVOT VTI:    0.180 m  AORTA Ao Root diam: 4.40 cm Ao Asc diam:  3.70 cm MITRAL VALVE MV Area (PHT): 3.45 cm    SHUNTS MV Decel Time: 220 msec    Systemic VTI:  0.18 m MV E velocity: 56.10 cm/s  Systemic Diam: 2.30 cm MV Brizza Nathanson velocity: 71.30 cm/s MV E/Kayde Warehime ratio:  0.79 Dalton McleanMD Electronically signed by Ezra Kanner Signature Date/Time: 10/25/2024/3:34:26 PM    Final    MR BRAIN WO CONTRAST Result Date: 10/25/2024 EXAM: MRI BRAIN WITHOUT CONTRAST 10/25/2024 01:00:00 AM TECHNIQUE: Multiplanar multisequence MRI of the head/brain was performed without the administration of intravenous contrast. COMPARISON: MRI head 09/22/2022. CLINICAL HISTORY: Neuro deficit, acute, stroke suspected FINDINGS: BRAIN AND VENTRICLES: No acute infarct. Remote left PCA territory infarct. Remote left basal ganglia and left frontal infarcts. No intracranial hemorrhage. No mass. No midline shift. No hydrocephalus. The sella is unremarkable. Diminuitive left MCA flow void compatible with known left MCA occlusion seen on same day CTA. ORBITS: No acute abnormality. SINUSES AND MASTOIDS: Left maxillary sinus mucosal thickening. BONES AND SOFT TISSUES: Normal marrow signal. Right parotid mass bettter charcterized on same day CTA head/neck. IMPRESSION: 1. No acute intracranial abnormality. 2. Remote left basal ganglia, left frontal and left PCA territory infarcts. 3. Right parotid mass bettter charcterized on same day CTA head/neck. Electronically signed by: Gilmore Molt 10/25/2024 01:34 AM EST RP Workstation: [id number]   CT Head Wo Contrast Result Date: 10/24/2024 EXAM: CT HEAD WITHOUT 10/24/2024 10:16:29 PM TECHNIQUE: CT of the head was performed without the administration of intravenous contrast. Automated exposure control, iterative reconstruction, and/or weight based adjustment of the mA/kV was utilized to reduce the radiation dose to as low as reasonably achievable. COMPARISON: CT code  stroke May 25, 2024 CLINICAL HISTORY: Headache, new onset (Age >= 51y) FINDINGS: BRAIN AND VENTRICLES: No acute intracranial hemorrhage. Similar remote left PCA territory, left basal ganglia, and left frontal cortical infarcts. No mass effect or midline shift. No extra-axial fluid collection. No evidence of acute infarct. No hydrocephalus. ORBITS: No acute abnormality. SINUSES AND MASTOIDS: Left maxillary sinus mucosal thickening. No mastoid effusions. SOFT TISSUES AND SKULL: No acute skull fracture. No acute soft tissue abnormality. IMPRESSION: 1. No acute intracranial abnormality. ASPECTS 10. 2. Similar remote left PCA territory, left basal ganglia, and left frontal cortical infarcts. Electronically signed by: Gilmore Molt 10/24/2024 10:58 PM EST RP Workstation: [id number]   CT ANGIO HEAD NECK W WO CM Result Date: 10/24/2024 EXAM: CTA Head and Neck with Intravenous Contrast. CT Head without Contrast. CLINICAL HISTORY: Neuro deficit, acute, stroke suspected TECHNIQUE: Axial CTA images of the head and neck performed with intravenous contrast. MIP reconstructed images were created and reviewed. Axial computed tomography images of the head/brain performed without intravenous contrast. Note: Per PQRS, the description of internal carotid artery percent stenosis, including  0 percent or normal exam, is based on [employer] American Symptomatic Carotid Endarterectomy Trial (NASCET) criteria. Dose reduction technique was used including one or more of the following: automated exposure control, adjustment of mA and kV according to patient size, and/or iterative reconstruction. CONTRAST: 75 mL Omnipaque  COMPARISON: CTA head/neck May 25, 2024 FINDINGS: CT HEAD: BRAIN: Similar appearance of remote left PCA territory, left basal ganglia, and left frontal cortical infarcts. No acute intraparenchymal hemorrhage. No mass lesion. No CT evidence for acute territorial infarct. No midline shift or extra-axial collection. VENTRICLES:  No hydrocephalus. ORBITS: The orbits are unremarkable. SINUSES AND MASTOIDS: Left maxillary sinus and scattered ethmoid air cells mucosal thickening. No mastoid effusion CTA NECK: COMMON CAROTID ARTERIES: No significant stenosis. No dissection or occlusion. INTERNAL CAROTID ARTERIES: No stenosis by NASCET criteria. No dissection or occlusion. 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 No dissection or occlusion. CTA HEAD: ANTERIOR CEREBRAL ARTERIES: No significant stenosis. No occlusion. No aneurysm. MIDDLE CEREBRAL ARTERIES: Similar left M1 MCA occlusion with more distal reconstitution. No significant stenosis of the right MCA. No aneurysm. POSTERIOR CEREBRAL ARTERIES: No significant stenosis. No occlusion. No aneurysm. BASILAR ARTERY: No significant stenosis. No occlusion. No aneurysm. OTHER: SOFT TISSUES: Similar 2.1 cm mass that appears to be arising from the right parotid mass. Increase in abnormally heterogeneous, rounded and now 1.1 cm right level II node on series 10, image 104. BONES: No acute osseous abnormality. IMPRESSION: 1. No evidence of acute intracranial abnormality. ASPECTS 10. 2. Similar left M1 MCA occlusion with more distal reconstitution. 3. 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 4. Similar 2.1 cm suspected right parotid mass with interval increase in abnormally heterogeneous, rounded and now 1.1 cm right level II node. 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 Recommend ENT consultation if not already made. Electronically signed by: Gilmore Molt 10/24/2024 10:55 PM EST RP Workstation: [id number]    Microbiology: No results found for this or any previous visit (from the past 240 hours).   Labs: Basic Metabolic Panel: Recent Labs  Lab 10/25/24 1210 10/26/24 0507 10/28/24 0329 10/29/24 0200 10/30/24 0227  NA 136 135 139 138 137  K 3.8 3.7 3.4* 3.3* 4.1  CL 101 99 100  98 100  CO2 26 24 30 27 25   GLUCOSE 251* 261* 117* 162* 155*  BUN 10 15 26* 29* 25*  CREATININE 1.07 1.14 1.57* 1.62* 1.37*  CALCIUM  8.4* 8.7* 8.7* 8.7* 9.4  MG  --   --  2.0 2.1 2.2  PHOS  --   --  4.2 3.9 3.3   Liver Function Tests: Recent Labs  Lab 10/24/24 2148 10/28/24 0329 10/29/24 0200  AST 17 15 17   ALT 23 19 23   ALKPHOS 82 73 71  BILITOT 0.7 1.1 0.8  PROT 7.2 6.5 6.0*  ALBUMIN 3.7 3.3* 3.2*   No results for input(s): LIPASE, AMYLASE in the last 168 hours. No results for input(s): AMMONIA in the last 168 hours. CBC: Recent Labs  Lab 10/24/24 2148 10/24/24 2158 10/25/24 0735 10/28/24 0329 10/29/24 0200 10/30/24 0227  WBC 8.4  --  7.7 9.4 9.1 8.6  NEUTROABS 5.4  --   --  5.8 5.5  --   HGB 15.8 16.7 14.6 16.1 15.5 16.5  HCT 46.5 49.0 43.1 46.4 45.4 48.8  MCV 89.6  --  89.4 87.5 87.8 89.4  PLT 208  --  176 218 204 200   Cardiac Enzymes: No results  for input(s): CKTOTAL, CKMB, CKMBINDEX, TROPONINI in the last 168 hours. BNP: BNP (last 3 results) No results for input(s): BNP in the last 8760 hours.  ProBNP (last 3 results) No results for input(s): PROBNP in the last 8760 hours.  CBG: Recent Labs  Lab 10/29/24 1619 10/29/24 2118 10/30/24 0625 10/30/24 0753 10/30/24 1109  GLUCAP 192* 247* 148* 145* 216*       Signed:  Meliton Monte MD.  [facility] 10/30/2024, 4:11 PM       [1]  Allergies Allergen Reactions   Fish Allergy Anaphylaxis, Other (See Comments) and Swelling    Throat swells   Penicillins Anaphylaxis, Swelling and Other (See Comments)    Throat swells   Shellfish Allergy Anaphylaxis, Swelling and Other (See Comments)    Throat swells

## 2024-10-30 NOTE — Progress Notes (Signed)
 PMR Admission Coordinator Pre-Admission Assessment   Patient: Stanley Stone is an 58 y.o., male MRN: [id number] DOB: [birth year] Height: 5' 5 (165.1 cm) Weight: 82 kg   Insurance Information HMO:     PPO:      PCP:      IPA:      80/20:      OTHER:  PRIMARY: Medicaid [place]      Policy#: [id number]       Subscriber:  CM Name:       Phone#:      Fax#:  Pre-Cert#:       Employer:  Benefits:  Phone #:      Name:  Eff. Date: Checked 10/30/24     Deduct:       Out of Pocket Max:       Life Max:  CIR:       SNF:  Outpatient:      Co-Pay:  Home Health:       Co-Pay:  DME:      Co-Pay:  Providers: in network  SECONDARY:       Policy#:      Phone#:    Artist:       Phone#:    [employer] for patients in Inpatient Rehabilitation Facilities with attached Privacy Act Statement-Health Care Records was provided and verbally reviewed with: N/A   Emergency Contact Information Contact Information       Name Relation Home Work Mobile    Henriques,AMY Daughter     [phone]         Other Contacts   None on File        Current Medical History  Patient Admitting Diagnosis: CVA, recrudescence History of Present Illness: Pt is a 58 y.o. male with medical history significant of prior CVA, COPD, diabetes, hypertension, hyperlipidemia who presented to [facility] emergency department 10/25/24 due to slurred speech, vision changes and left-sided weakness. CTA head and neck showed carotid mass and severe stenosis of vertebral artery. MRI negative for acute stroke -remote L basal ganglia, L frontal and L PCA territory infarcts. CTA head/neck with severe L M1 MCA occlusion with more distal reconstitution, similar severe stenosis of the proximal L V2 vertebral artery Neurology consulted - Suspected recrudescence of old stroke sx due to HA and hypertensive emergency. Continue statin, aspirin . Parotid mass also noted on imaging, ENT consulted and  recommended outpatient work up. Pt. Seen by PT/OT and they recommend CIR to assist return to PLOF.    Complete NIHSS TOTAL: 6   Patient's medical record from [facility] has been reviewed by the rehabilitation admission coordinator and physician.   Past Medical History      Past Medical History:  Diagnosis Date   AKI (acute kidney injury) 07/20/2022   Closed fracture dislocation of lumbar spine (HCC) 1987   COPD (chronic obstructive pulmonary disease) (HCC)     COVID-19 virus infection 07/20/2022   CVA (cerebral vascular accident) (HCC) 03/25/2022   Diabetes mellitus without complication (HCC)     GERD (gastroesophageal reflux disease)     High cholesterol     Homelessness 02/07/2019   Hypertension            Has the patient had major surgery during 100 days prior to admission? No   Family History   family history is not on file.   Current Medications [Current Medications]  [Current Medications]    Current Facility-Administered Medications:  acetaminophen  (TYLENOL ) tablet 650 mg, 650 mg, Oral, Q6H PRN **OR** acetaminophen  (TYLENOL ) suppository 650 mg, 650 mg, Rectal, Q6H PRN, Dorrell, Robert, MD   amLODipine  (NORVASC ) tablet 10 mg, 10 mg, Oral, Daily, Dorrell, Robert, MD, 10 mg at 10/30/24 9061   aspirin  tablet 325 mg, 325 mg, Oral, Daily, Dorrell, Robert, MD, 325 mg at 10/30/24 9061   atorvastatin  (LIPITOR ) tablet 80 mg, 80 mg, Oral, QPM, Dorrell, Lamar, MD, 80 mg at 10/29/24 1704   carvedilol  (COREG ) tablet 25 mg, 25 mg, Oral, BID WC, Dorrell, Robert, MD, 25 mg at 10/30/24 0805   enoxaparin  (LOVENOX ) injection 40 mg, 40 mg, Subcutaneous, Daily, Dorrell, Robert, MD, 40 mg at 10/30/24 0940   insulin  aspart (novoLOG ) injection 0-20 Units, 0-20 Units, Subcutaneous, TID WC, Perri DELENA Meliton Mickey., MD, 7 Units at 10/30/24 1131   insulin  aspart (novoLOG ) injection 0-5 Units, 0-5 Units, Subcutaneous, QHS, Perri DELENA Meliton Mickey., MD, 2 Units at 10/29/24 2215    insulin  aspart (novoLOG ) injection 6 Units, 6 Units, Subcutaneous, TID WC, Perri DELENA Meliton Mickey., MD, 6 Units at 10/30/24 9062   insulin  glargine (LANTUS ) injection 36 Units, 36 Units, Subcutaneous, Daily, Perri DELENA Meliton Mickey., MD, 36 Units at 10/30/24 0950   nicotine  (NICODERM CQ  - dosed in mg/24 hours) patch 21 mg, 21 mg, Transdermal, Daily, Drusilla, Sabas RAMAN, MD, 21 mg at 10/30/24 9060   ondansetron  (ZOFRAN ) tablet 4 mg, 4 mg, Oral, Q6H PRN **OR** ondansetron  (ZOFRAN ) injection 4 mg, 4 mg, Intravenous, Q6H PRN, Dorrell, Robert, MD   pantoprazole  (PROTONIX ) EC tablet 40 mg, 40 mg, Oral, Daily, Dorrell, Robert, MD, 40 mg at 10/30/24 9061   polyethylene glycol (MIRALAX  / GLYCOLAX ) packet 17 g, 17 g, Oral, BID, Perri DELENA Meliton Mickey., MD   sertraline  (ZOLOFT ) tablet 50 mg, 50 mg, Oral, Daily, Dorrell, Robert, MD, 50 mg at 10/30/24 9061    Patients Current Diet:  Diet Order                  Diet Carb Modified Room service appropriate? Yes  Diet effective now                         Precautions / Restrictions Precautions Precautions: Fall Precaution/Restrictions Comments: low vision Restrictions Weight Bearing Restrictions Per Provider Order: No    Has the patient had 2 or more falls or a fall with injury in the past year? Yes   Prior Activity Level Community (5-7x/wk): Pt. active in the community PTA   Prior Functional Level Self Care: Did the patient need help bathing, dressing, using the toilet or eating? Independent   Indoor Mobility: Did the patient need assistance with walking from room to room (with or without device)? Independent   Stairs: Did the patient need assistance with internal or external stairs (with or without device)? Independent   Functional Cognition: Did the patient need help planning regular tasks such as shopping or remembering to take medications? Needed some help   Patient Information Are you of Hispanic, Latino/a,or Spanish origin?: A. No, not of  Hispanic, Latino/a, or Spanish origin What is your race?: A. White Do you need or want an interpreter to communicate with a doctor or health care staff?: 1. Yes   Patient's Response To:  Health Literacy and Transportation Is the patient able to respond to health literacy and transportation needs?: Yes Health Literacy - How often do you need to have someone help you when you read instructions, pamphlets,  or other written material from your doctor or pharmacy?: Always In the past 12 months, has lack of transportation kept you from medical appointments or from getting medications?: No In the past 12 months, has lack of transportation kept you from meetings, work, or from getting things needed for daily living?: No   Home Assistive Devices / Equipment   Prior Device Use: Indicate devices/aids used by the patient prior to current illness, exacerbation or injury? Walker   Current Functional Level Cognition   Overall Cognitive Status: Within Functional Limits for tasks assessed (suspect near baseline, will need further assessment of higher level function) Orientation Level: Oriented X4    Extremity Assessment (includes Sensation/Coordination)   Upper Extremity Assessment: Defer to OT evaluation LUE Deficits / Details: inconsistent performance during MMT vs functional use, 2/5 FF, 3/5 elbow and forearm, 3/5 gross grasp, ataxic quality to movement with MMT, later observed to self assist L LE into bed with L UE and place behind him to help him scoot LUE Sensation: decreased light touch (slowed response during proprioception testing, but correct responses) LUE Coordination: decreased fine motor, decreased gross motor  Lower Extremity Assessment: LLE deficits/detail LLE Deficits / Details: inconsistent with testing vs. functional activities. Pt with 0/5 strength with MMT in L knee extension. Pt was able to take steps from chair to stretcher clearing floor with LLE     ADLs   Overall ADL's : Needs  assistance/impaired Eating/Feeding: Set up, Sitting Grooming: Wash/dry hands, Wash/dry face, Sitting, Set up, Brushing hair Upper Body Bathing: Moderate assistance, Sitting Lower Body Bathing: +2 for physical assistance, Sit to/from stand, Maximal assistance Upper Body Dressing : Moderate assistance, Sitting Lower Body Dressing: Total assistance, Sitting/lateral leans Lower Body Dressing Details (indicate cue type and reason): for shoes Functional mobility during ADLs: +2 for physical assistance, Minimal assistance, Moderate assistance, Rolling walker (2 wheels)     Mobility   Overal bed mobility: Needs Assistance Bed Mobility: Rolling, Sidelying to Sit Rolling: Modified independent (Device/Increase time) Sidelying to sit: Contact guard assist, Used rails Supine to sit: Min assist Sit to supine: Min assist General bed mobility comments: slight lightheadedness on sitting therefore CGA for safety     Transfers   Overall transfer level: Needs assistance Equipment used: Rolling walker (2 wheels) Transfers: Sit to/from Stand Sit to Stand: Contact guard assist Bed to/from chair/wheelchair/BSC transfer type:: Step pivot Step pivot transfers: +2 physical assistance, Min assist, Mod assist General transfer comment: able to place and maintain L hand on walker handle; vc to use RUE to push up from surface     Ambulation / Gait / Stairs / Wheelchair Mobility   Ambulation/Gait Ambulation/Gait assistance: Editor, Commissioning (Feet): 100 Feet Assistive device: Rolling walker (2 wheels) Gait Pattern/deviations: Step-to pattern, Decreased stride length, Knee flexed in stance - right, Knee flexed in stance - left, Knees buckling General Gait Details: light support due to quick release/buckling of bil Knees followed by catching and returning to upright; LLE slightly worse than RLE; did not improve or worsen with incr distance/time Gait velocity: decr     Posture / Balance Balance Overall  balance assessment: Needs assistance Sitting balance-Leahy Scale: Fair Standing balance support: Bilateral upper extremity supported, Reliant on assistive device for balance Standing balance-Leahy Scale: Poor Standing balance comment: and external support     Special considerations/life events  Special service needs none    Previous Home Environment (from acute therapy documentation)  Lives With: Other (Comment) Available Help at Discharge: Family Type of  Home: Other(Comment) Home Layout: One level Home Access: Level entry Bathroom Shower/Tub: Engineer, Manufacturing Systems: Standard Bathroom Accessibility: Yes How Accessible: Accessible via walker Additional Comments: Motel   Discharge Living Setting Plans for Discharge Living Setting: Patient's home, House Type of Home at Discharge: House Discharge Home Layout: Multi-level, Able to live on main level with bedroom/bathroom Alternate Level Stairs-Rails: None Discharge Home Access: Level entry Discharge Bathroom Shower/Tub: Tub/shower unit Discharge Bathroom Toilet: Standard Discharge Bathroom Accessibility: Yes How Accessible: Accessible via walker   Social/Family/Support Systems Contact Information: [phone] Anticipated Caregiver: Lattie Cervi daughter Ability/Limitations of Caregiver: Daughter works from home, can be with Pt. most of the time. Older teenagers also live in the home. Pt. may be alone for a few hours at a time. Caregiver Availability: Other (Comment) Discharge Plan Discussed with Primary Caregiver: Yes Is Caregiver In Agreement with Plan?: Yes Does Caregiver/Family have Issues with Lodging/Transportation while Pt is in Rehab?: No   Goals Patient/Family Goal for Rehab: PT/OT/SLP Supervision to mod I Expected length of stay: 7-10 days Pt/Family Agrees to Admission and willing to participate: Yes Program Orientation Provided & Reviewed with Pt/Caregiver Including Roles  & Responsibilities: Yes   Decrease  burden of Care through IP rehab admission: Not anticipated   Possible need for SNF placement upon discharge: Not anticipated   Patient Condition: I have reviewed medical records from [facility], spoken with CM, and patient and family member. I met with patient at the bedside for inpatient rehabilitation assessment.  Patient will benefit from ongoing PT, OT, and SLP, can actively participate in 3 hours of therapy a day 5 days of the week, and can make measurable gains during the admission.  Patient will also benefit from the coordinated team approach during an Inpatient Acute Rehabilitation admission.  The patient will receive intensive therapy as well as Rehabilitation physician, nursing, social worker, and care management interventions.  Due to safety, skin/wound care, disease management, medication administration, pain management, and patient education the patient requires 24 hour a day rehabilitation nursing.  The patient is currently Min A with mobility and basic ADLs.  Discharge setting and therapy post discharge at home with home health is anticipated.  Patient has agreed to participate in the Acute Inpatient Rehabilitation Program and will admit today.   Preadmission Screen Completed By:  Leita KATHEE Kleine, 10/30/2024 12:52 PM ______________________________________________________________________   Discussed status with Dr. Lorilee on 10/30/24 at 900 and received approval for admission today.   Admission Coordinator:  Leita KATHEE Kleine, CCC-SLP, time 1255/Date 10/30/24    Assessment/Plan: Diagnosis: CVA  Does the need for close, 24 hr/day Medical supervision in concert with the patient's rehab needs make it unreasonable for this patient to be served in a less intensive setting? Yes Co-Morbidities requiring supervision/potential complications: class 1 obesity, prior CVA, COPD, diabetes, HTN, HLD, severe stenosis of vertebral artery Due to bladder management, bowel management,  safety, skin/wound care, disease management, medication administration, pain management, and patient education, does the patient require 24 hr/day rehab nursing? Yes Does the patient require coordinated care of a physician, rehab nurse, PT, OT, and SLP to address physical and functional deficits in the context of the above medical diagnosis(es)? Yes Addressing deficits in the following areas: balance, endurance, locomotion, strength, transferring, bowel/bladder control, bathing, dressing, feeding, grooming, toileting, cognition, and psychosocial support Can the patient actively participate in an intensive therapy program of at least 3 hrs of therapy 5 days a week? Yes The potential for patient to make measurable  gains while on inpatient rehab is excellent Anticipated functional outcomes upon discharge from inpatient rehab: supervision PT, supervision OT, supervision SLP Estimated rehab length of stay to reach the above functional goals is: 5-7 days Anticipated discharge destination: Home 10. Overall Rehab/Functional Prognosis: excellent     MD Signature: Sven Elks, MD             Revision History  Date/Time User Provider Type Action  10/30/2024  1:03 PM Elks Sven SQUIBB, MD Physician Sign  10/30/2024  1:00 PM Cecilie Leita NOVAK, CCC-SLP Rehab Admission Coordinator Share  10/30/2024 12:58 PM Cecilie Leita NOVAK OVERMAN Rehab Admission Coordinator Share

## 2024-10-30 NOTE — PMR Pre-admission (Signed)
 PMR Admission Coordinator Pre-Admission Assessment  Patient: Stanley Stone is an 58 y.o., male MRN: [id number] DOB: [birth year] Height: 5' 5 (165.1 cm) Weight: 82 kg  Insurance Information HMO:     PPO:      PCP:      IPA:      80/20:      OTHER:  PRIMARY: Medicaid of [place]      Policy#: [id number]       Subscriber:  CM Name:       Phone#:      Fax#:  Pre-Cert#:       Employer:  Benefits:  Phone #:      Name:  Eff. Date: Checked 10/30/24     Deduct:       Out of Pocket Max:       Life Max:  CIR:       SNF:  Outpatient:      Co-Pay:  Home Health:       Co-Pay:  DME:      Co-Pay:  Providers: in network  SECONDARY:       Policy#:      Phone#:   Artist:       Phone#:   The Best Boy for patients in Inpatient Rehabilitation Facilities with attached Privacy Act Statement-Health Care Records was provided and verbally reviewed with: N/A  Emergency Contact Information Contact Information     Name Relation Home Work Mobile   Meadow,AMY Daughter   [phone]      Other Contacts   None on File     Current Medical History  Patient Admitting Diagnosis: CVA, recrudescence History of Present Illness: Pt is a 58 y.o. male with medical history significant of prior CVA, COPD, diabetes, hypertension, hyperlipidemia who presented to [facility] emergency department 10/25/24 due to slurred speech, vision changes and left-sided weakness. CTA head and neck showed carotid mass and severe stenosis of vertebral artery. MRI negative for acute stroke -remote L basal ganglia, L frontal and L PCA territory infarcts. CTA head/neck with severe L M1 MCA occlusion with more distal reconstitution, similar severe stenosis of the proximal L V2 vertebral artery Neurology consulted - Suspected recrudescence of old stroke sx due to HA and hypertensive emergency. Continue statin, aspirin . Parotid mass also noted on imaging, ENT consulted and recommended  outpatient work up. Pt. Seen by PT/OT and they recommend CIR to assist return to PLOF.   Complete NIHSS TOTAL: 6  Patient's medical record from [facility] has been reviewed by the rehabilitation admission coordinator and physician.  Past Medical History  Past Medical History:  Diagnosis Date   AKI (acute kidney injury) 07/20/2022   Closed fracture dislocation of lumbar spine (HCC) 1987   COPD (chronic obstructive pulmonary disease) (HCC)    COVID-19 virus infection 07/20/2022   CVA (cerebral vascular accident) (HCC) 03/25/2022   Diabetes mellitus without complication (HCC)    GERD (gastroesophageal reflux disease)    High cholesterol    Homelessness 02/07/2019   Hypertension     Has the patient had major surgery during 100 days prior to admission? No  Family History   family history is not on file.  Current Medications Current Medications[1]  Patients Current Diet:  Diet Order             Diet Carb Modified Room service appropriate? Yes  Diet effective now  Precautions / Restrictions Precautions Precautions: Fall Precaution/Restrictions Comments: low vision Restrictions Weight Bearing Restrictions Per Provider Order: No   Has the patient had 2 or more falls or a fall with injury in the past year? Yes  Prior Activity Level Community (5-7x/wk): Pt. active in the community PTA  Prior Functional Level Self Care: Did the patient need help bathing, dressing, using the toilet or eating? Independent  Indoor Mobility: Did the patient need assistance with walking from room to room (with or without device)? Independent  Stairs: Did the patient need assistance with internal or external stairs (with or without device)? Independent  Functional Cognition: Did the patient need help planning regular tasks such as shopping or remembering to take medications? Needed some help  Patient Information Are you of Hispanic, Latino/a,or Spanish  origin?: A. No, not of Hispanic, Latino/a, or Spanish origin What is your race?: A. White Do you need or want an interpreter to communicate with a doctor or health care staff?: 1. Yes  Patient's Response To:  Health Literacy and Transportation Is the patient able to respond to health literacy and transportation needs?: Yes Health Literacy - How often do you need to have someone help you when you read instructions, pamphlets, or other written material from your doctor or pharmacy?: Always In the past 12 months, has lack of transportation kept you from medical appointments or from getting medications?: No In the past 12 months, has lack of transportation kept you from meetings, work, or from getting things needed for daily living?: No  Home Assistive Devices / Equipment    Prior Device Use: Indicate devices/aids used by the patient prior to current illness, exacerbation or injury? Walker  Current Functional Level Cognition  Overall Cognitive Status: Within Functional Limits for tasks assessed (suspect near baseline, will need further assessment of higher level function) Orientation Level: Oriented X4    Extremity Assessment (includes Sensation/Coordination)  Upper Extremity Assessment: Defer to OT evaluation LUE Deficits / Details: inconsistent performance during MMT vs functional use, 2/5 FF, 3/5 elbow and forearm, 3/5 gross grasp, ataxic quality to movement with MMT, later observed to self assist L LE into bed with L UE and place behind him to help him scoot LUE Sensation: decreased light touch (slowed response during proprioception testing, but correct responses) LUE Coordination: decreased fine motor, decreased gross motor  Lower Extremity Assessment: LLE deficits/detail LLE Deficits / Details: inconsistent with testing vs. functional activities. Pt with 0/5 strength with MMT in L knee extension. Pt was able to take steps from chair to stretcher clearing floor with LLE    ADLs   Overall ADL's : Needs assistance/impaired Eating/Feeding: Set up, Sitting Grooming: Wash/dry hands, Wash/dry face, Sitting, Set up, Brushing hair Upper Body Bathing: Moderate assistance, Sitting Lower Body Bathing: +2 for physical assistance, Sit to/from stand, Maximal assistance Upper Body Dressing : Moderate assistance, Sitting Lower Body Dressing: Total assistance, Sitting/lateral leans Lower Body Dressing Details (indicate cue type and reason): for shoes Functional mobility during ADLs: +2 for physical assistance, Minimal assistance, Moderate assistance, Rolling walker (2 wheels)    Mobility  Overal bed mobility: Needs Assistance Bed Mobility: Rolling, Sidelying to Sit Rolling: Modified independent (Device/Increase time) Sidelying to sit: Contact guard assist, Used rails Supine to sit: Min assist Sit to supine: Min assist General bed mobility comments: slight lightheadedness on sitting therefore CGA for safety    Transfers  Overall transfer level: Needs assistance Equipment used: Rolling walker (2 wheels) Transfers: Sit to/from Stand Sit to Stand: Contact  guard assist Bed to/from chair/wheelchair/BSC transfer type:: Step pivot Step pivot transfers: +2 physical assistance, Min assist, Mod assist General transfer comment: able to place and maintain L hand on walker handle; vc to use RUE to push up from surface    Ambulation / Gait / Stairs / Wheelchair Mobility  Ambulation/Gait Ambulation/Gait assistance: Editor, Commissioning (Feet): 100 Feet Assistive device: Rolling walker (2 wheels) Gait Pattern/deviations: Step-to pattern, Decreased stride length, Knee flexed in stance - right, Knee flexed in stance - left, Knees buckling General Gait Details: light support due to quick release/buckling of bil Knees followed by catching and returning to upright; LLE slightly worse than RLE; did not improve or worsen with incr distance/time Gait velocity: decr    Posture / Balance  Balance Overall balance assessment: Needs assistance Sitting balance-Leahy Scale: Fair Standing balance support: Bilateral upper extremity supported, Reliant on assistive device for balance Standing balance-Leahy Scale: Poor Standing balance comment: and external support    Special considerations/life events  Special service needs none   Previous Home Environment (from acute therapy documentation)  Lives With: Other (Comment) Available Help at Discharge: Family Type of Home: Other(Comment) Home Layout: One level Home Access: Level entry Bathroom Shower/Tub: Engineer, Manufacturing Systems: Standard Bathroom Accessibility: Yes How Accessible: Accessible via walker Additional Comments: Motel  Discharge Living Setting Plans for Discharge Living Setting: Patient's home, House Type of Home at Discharge: House Discharge Home Layout: Multi-level, Able to live on main level with bedroom/bathroom Alternate Level Stairs-Rails: None Discharge Home Access: Level entry Discharge Bathroom Shower/Tub: Tub/shower unit Discharge Bathroom Toilet: Standard Discharge Bathroom Accessibility: Yes How Accessible: Accessible via walker  Social/Family/Support Systems Contact Information: [phone] Anticipated Caregiver: Abhijay Morriss daughter Ability/Limitations of Caregiver: Daughter works from home, can be with Pt. most of the time. Older teenagers also live in the home. Pt. may be alone for a few hours at a time. Caregiver Availability: Other (Comment) Discharge Plan Discussed with Primary Caregiver: Yes Is Caregiver In Agreement with Plan?: Yes Does Caregiver/Family have Issues with Lodging/Transportation while Pt is in Rehab?: No  Goals Patient/Family Goal for Rehab: PT/OT/SLP Supervision to mod I Expected length of stay: 7-10 days Pt/Family Agrees to Admission and willing to participate: Yes Program Orientation Provided & Reviewed with Pt/Caregiver Including Roles  & Responsibilities:  Yes  Decrease burden of Care through IP rehab admission: Not anticipated  Possible need for SNF placement upon discharge: Not anticipated  Patient Condition: I have reviewed medical records from [facility], spoken with CM, and patient and family member. I met with patient at the bedside for inpatient rehabilitation assessment.  Patient will benefit from ongoing PT, OT, and SLP, can actively participate in 3 hours of therapy a day 5 days of the week, and can make measurable gains during the admission.  Patient will also benefit from the coordinated team approach during an Inpatient Acute Rehabilitation admission.  The patient will receive intensive therapy as well as Rehabilitation physician, nursing, social worker, and care management interventions.  Due to safety, skin/wound care, disease management, medication administration, pain management, and patient education the patient requires 24 hour a day rehabilitation nursing.  The patient is currently Min A with mobility and basic ADLs.  Discharge setting and therapy post discharge at home with home health is anticipated.  Patient has agreed to participate in the Acute Inpatient Rehabilitation Program and will admit today.  Preadmission Screen Completed By:  Leita KATHEE Kleine, 10/30/2024 12:52 PM ______________________________________________________________________   Discussed status  with Dr. Lorilee on 10/30/24 at 900 and received approval for admission today.  Admission Coordinator:  Leita KATHEE Kleine, CCC-SLP, time 1255/Date 10/30/24   Assessment/Plan: Diagnosis: CVA  Does the need for close, 24 hr/day Medical supervision in concert with the patient's rehab needs make it unreasonable for this patient to be served in a less intensive setting? Yes Co-Morbidities requiring supervision/potential complications: class 1 obesity, prior CVA, COPD, diabetes, HTN, HLD, severe stenosis of vertebral artery Due to bladder management, bowel  management, safety, skin/wound care, disease management, medication administration, pain management, and patient education, does the patient require 24 hr/day rehab nursing? Yes Does the patient require coordinated care of a physician, rehab nurse, PT, OT, and SLP to address physical and functional deficits in the context of the above medical diagnosis(es)? Yes Addressing deficits in the following areas: balance, endurance, locomotion, strength, transferring, bowel/bladder control, bathing, dressing, feeding, grooming, toileting, cognition, and psychosocial support Can the patient actively participate in an intensive therapy program of at least 3 hrs of therapy 5 days a week? Yes The potential for patient to make measurable gains while on inpatient rehab is excellent Anticipated functional outcomes upon discharge from inpatient rehab: supervision PT, supervision OT, supervision SLP Estimated rehab length of stay to reach the above functional goals is: 5-7 days Anticipated discharge destination: Home 10. Overall Rehab/Functional Prognosis: excellent   MD Signature: Sven Lorilee, MD     [1]  Current Facility-Administered Medications:    acetaminophen  (TYLENOL ) tablet 650 mg, 650 mg, Oral, Q6H PRN **OR** acetaminophen  (TYLENOL ) suppository 650 mg, 650 mg, Rectal, Q6H PRN, Dena Charleston, MD   amLODipine  (NORVASC ) tablet 10 mg, 10 mg, Oral, Daily, Dorrell, Robert, MD, 10 mg at 10/30/24 9061   aspirin  tablet 325 mg, 325 mg, Oral, Daily, Dorrell, Robert, MD, 325 mg at 10/30/24 9061   atorvastatin  (LIPITOR ) tablet 80 mg, 80 mg, Oral, QPM, Dorrell, Charleston, MD, 80 mg at 10/29/24 1704   carvedilol  (COREG ) tablet 25 mg, 25 mg, Oral, BID WC, Dorrell, Robert, MD, 25 mg at 10/30/24 0805   enoxaparin  (LOVENOX ) injection 40 mg, 40 mg, Subcutaneous, Daily, Dorrell, Robert, MD, 40 mg at 10/30/24 0940   insulin  aspart (novoLOG ) injection 0-20 Units, 0-20 Units, Subcutaneous, TID WC, Perri DELENA Meliton Mickey.,  MD, 7 Units at 10/30/24 1131   insulin  aspart (novoLOG ) injection 0-5 Units, 0-5 Units, Subcutaneous, QHS, Perri DELENA Meliton Mickey., MD, 2 Units at 10/29/24 2215   insulin  aspart (novoLOG ) injection 6 Units, 6 Units, Subcutaneous, TID WC, Perri DELENA Meliton Mickey., MD, 6 Units at 10/30/24 9062   insulin  glargine (LANTUS ) injection 36 Units, 36 Units, Subcutaneous, Daily, Perri DELENA Meliton Mickey., MD, 36 Units at 10/30/24 0950   nicotine  (NICODERM CQ  - dosed in mg/24 hours) patch 21 mg, 21 mg, Transdermal, Daily, Drusilla, Sabas RAMAN, MD, 21 mg at 10/30/24 9060   ondansetron  (ZOFRAN ) tablet 4 mg, 4 mg, Oral, Q6H PRN **OR** ondansetron  (ZOFRAN ) injection 4 mg, 4 mg, Intravenous, Q6H PRN, Dorrell, Robert, MD   pantoprazole  (PROTONIX ) EC tablet 40 mg, 40 mg, Oral, Daily, Dorrell, Robert, MD, 40 mg at 10/30/24 9061   polyethylene glycol (MIRALAX  / GLYCOLAX ) packet 17 g, 17 g, Oral, BID, Perri DELENA Meliton Mickey., MD   sertraline  (ZOLOFT ) tablet 50 mg, 50 mg, Oral, Daily, Dorrell, Robert, MD, 50 mg at 10/30/24 [phone]

## 2024-10-30 NOTE — Progress Notes (Signed)
 PROGRESS NOTE    Stanley Stone  FMW:[id number] DOB: 01-25-1966 DOA: 10/24/2024 PCP: [facility]  Chief Complaint  Patient presents with   Numbness    Brief Narrative:   58 y.o. male with medical history significant of prior CVA, COPD, diabetes, hypertension, hyperlipidemia who presented emergency department due to slurred speech, vision changes and left-sided weakness.  CTA head and neck showed carotid mass and severe stenosis of vertebral artery. MRI brain showed no acute infarcts. Neurology was consulted and patient was admitted further workup   Assessment & Plan:   Principal Problem:   Hypertensive emergency Active Problems:   TIA (transient ischemic attack)  # Slurred speech and weakness most likely secondary to recrudescence of prior stroke due to hypertensive emergency - MRI negative for acute stroke -remote L basal ganglia, L frontal and L PCA territory infarcts -- CTA head/neck with severe L M1 MCA occlusion with more distal reconstitution, similar severe stenosis of the proximal L V2 vertebral artery - Neurology consulted - Suspected recrudescence of old stroke sx due to HA and hypertensive emergency.  Continue statin, aspirin .    # AKI  - mild, follow UA - 30 mg/dl protein, 0-5 RBC. - improving - holding thiazide, arb  # Hypokalemia - replace, follow  # Parotid mass -2.1 cm right parotid mass with interval increase in abnormally heterogeneous rounded 1.1 cm right level 2 node concerning with primary parotid neoplasm with progressive nodal metastasis.   ENT consulted, Dr. Salina saw the patient.  Plan for workup of parotid mass as outpatient.   # Dysphagia -SLP recommending regular, thin liquids   # Hypertension -continue amlodipine , carvedilol  - irbesartan , hydrochlorothiazide  on hold with AKI   # GERD -continue Protonix    # Depression -continue Zoloft    # Diabetes mellitus type 2 - A1c 9.6 - BG's uncontrolled - adjust  insulin  - basal, bolus, ssi - carb mod diet   # Legally Blind  - noted   # Obesity Body mass index is 30.08 kg/m.    DVT prophylaxis: lovenox  Code Status: DNR Family Communication: none Disposition:   Status is: Inpatient Remains inpatient appropriate because: need for continued inpatient care, SNF placement    Consultants:  neurology  Procedures:  Echo IMPRESSIONS     1. Left ventricular ejection fraction, by estimation, is 55 to 60%. Left  ventricular ejection fraction by 2D MOD biplane is 60.1 %. The left  ventricle has normal function. The left ventricle has no regional wall  motion abnormalities. There is moderate  concentric left ventricular hypertrophy. Left ventricular diastolic  parameters are consistent with Grade I diastolic dysfunction (impaired  relaxation).   2. Right ventricular systolic function is normal. The right ventricular  size is normal. Tricuspid regurgitation signal is inadequate for assessing  PA pressure.   3. Left atrial size was mildly dilated.   4. The mitral valve is normal in structure. No evidence of mitral valve  regurgitation. No evidence of mitral stenosis.   5. The aortic valve is tricuspid. Aortic valve regurgitation is mild. No  aortic stenosis is present.   6. Aortic dilatation noted. There is moderate dilatation of the ascending  aorta, measuring 44 mm.   7. The inferior vena cava is normal in size with greater than 50%  respiratory variability, suggesting right atrial pressure of 3 mmHg.   Antimicrobials:  Anti-infectives (From admission, onward)    None       Subjective:  No new complaints  Objective: Vitals:  10/30/24 0001 10/30/24 0325 10/30/24 0730 10/30/24 1122  BP: 123/75 110/64 134/70 123/76  Pulse: 67 66 69 73  Resp: 18 17 16 20   Temp: 98 F (36.7 C)  98.3 F (36.8 C) 98.7 F (37.1 C)  TempSrc: Oral  Oral Oral  SpO2: 96% 94% 95% 97%  Weight:      Height:        Intake/Output Summary (Last 24  hours) at 10/30/2024 1425 Last data filed at 10/30/2024 1330 Gross per 24 hour  Intake 1320 ml  Output 1875 ml  Net -555 ml   Filed Weights   10/24/24 2137  Weight: 82 kg    Examination:  General: No acute distress. Cardiovascular: RRR Lungs: unlabored Neurological: continued L sided weakness Extremities: No clubbing or cyanosis. No edema.   Data Reviewed: I have personally reviewed following labs and imaging studies  CBC: Recent Labs  Lab 10/24/24 2148 10/24/24 2158 10/25/24 0735 10/28/24 0329 10/29/24 0200 10/30/24 0227  WBC 8.4  --  7.7 9.4 9.1 8.6  NEUTROABS 5.4  --   --  5.8 5.5  --   HGB 15.8 16.7 14.6 16.1 15.5 16.5  HCT 46.5 49.0 43.1 46.4 45.4 48.8  MCV 89.6  --  89.4 87.5 87.8 89.4  PLT 208  --  176 218 204 200    Basic Metabolic Panel: Recent Labs  Lab 10/25/24 1210 10/26/24 0507 10/28/24 0329 10/29/24 0200 10/30/24 0227  NA 136 135 139 138 137  K 3.8 3.7 3.4* 3.3* 4.1  CL 101 99 100 98 100  CO2 26 24 30 27 25   GLUCOSE 251* 261* 117* 162* 155*  BUN 10 15 26* 29* 25*  CREATININE 1.07 1.14 1.57* 1.62* 1.37*  CALCIUM  8.4* 8.7* 8.7* 8.7* 9.4  MG  --   --  2.0 2.1 2.2  PHOS  --   --  4.2 3.9 3.3    GFR: Estimated Creatinine Clearance: 57.2 mL/min (Katie Moch) (by C-G formula based on SCr of 1.37 mg/dL (H)).  Liver Function Tests: Recent Labs  Lab 10/24/24 2148 10/28/24 0329 10/29/24 0200  AST 17 15 17   ALT 23 19 23   ALKPHOS 82 73 71  BILITOT 0.7 1.1 0.8  PROT 7.2 6.5 6.0*  ALBUMIN 3.7 3.3* 3.2*    CBG: Recent Labs  Lab 10/29/24 1619 10/29/24 2118 10/30/24 0625 10/30/24 0753 10/30/24 1109  GLUCAP 192* 247* 148* 145* 216*     No results found for this or any previous visit (from the past 240 hours).       Radiology Studies: No results found.      Scheduled Meds:  amLODipine   10 mg Oral Daily   aspirin   325 mg Oral Daily   atorvastatin   80 mg Oral QPM   carvedilol   25 mg Oral BID WC   enoxaparin  (LOVENOX ) injection   40 mg Subcutaneous Daily   insulin  aspart  0-20 Units Subcutaneous TID WC   insulin  aspart  0-5 Units Subcutaneous QHS   insulin  aspart  6 Units Subcutaneous TID WC   insulin  glargine  36 Units Subcutaneous Daily   nicotine   21 mg Transdermal Daily   pantoprazole   40 mg Oral Daily   polyethylene glycol  17 g Oral BID   sertraline   50 mg Oral Daily   Continuous Infusions:   LOS: 3 days    Time spent: over 30 min     Meliton Monte, MD [facility]   To contact the attending provider between 7A-7P or  the covering provider during after hours 7P-7A, please log into the web site [email or web address] and access using universal [place] password for that web site. If you do not have the password, please call the hospital operator.  10/30/2024, 2:25 PM

## 2024-10-30 NOTE — H&P (Signed)
 Physical Medicine and Rehabilitation Admission H&P  CC: Left basal ganglia CVA  HPI: Stanley Stone is a 58 year old right-handed male with history significant for COPD/tobacco use, alcohol and polysubstance use , diabetes mellitus, hypertension, hyperlipidemia, GERD, medical noncompliance, depression as well as CVA with residual left-sided weakness and legally blind.  Per chart review patient has been staying in a motel living alone.  Was using a rollator.  He uses the bus for transportation.  He goes to the shelter to eat.  Independent in self-care.  He does have a daughter in the area who he plans to stay with on discharge.  Presented 10/24/2024 with reports of increased left-sided weakness worsening vision and slurred speech as well as increasing headache that got progressively worse over 2 days.  Noted blood pressure 148/91.  CT/MRI/CTA showed no acute intracranial abnormality.  Remote left basal ganglia, left frontal and left PCA territory infarct.  CTA showed similar left M1 MCA occlusion with more distal reconstitution.  2.1 cm suspected right parotid mass with interval increase in abnormality heterogeneous, rounded and 1.1 cm right level 2 node.  Admission chemistries urine drug screen negative, sodium 134, chloride 97, glucose 467, alcohol 66, hemoglobin A1c 9.6.  Patient did not receive TNK.  Echocardiogram ejection fraction of 55 to 60% no wall motion abnormalities grade 1 diastolic dysfunction.  Follow-up ENT Dr. Norleen Notice for parotid mass and plan workup as outpatient with consideration for fine-needle aspiration.  Neurology follow-up currently maintained on aspirin  325 mg daily.  Lovenox  added for DVT prophylaxis.  CVA felt likely recrudescence of old stroke symptoms due to headache and hypertensive emergency.  Tolerating a regular diet.  Therapy evaluations completed due to patient decreased functional mobility was admitted for a comprehensive rehab program. Complains of blurred  vision.  Review of Systems  Constitutional:  Negative for chills and fever.  HENT:  Negative for hearing loss.   Eyes:  Positive for blurred vision. Negative for double vision.  Respiratory:  Negative for cough, shortness of breath and wheezing.   Cardiovascular:  Negative for chest pain, palpitations and leg swelling.  Gastrointestinal:  Positive for constipation. Negative for heartburn, nausea and vomiting.       GERD  Genitourinary:  Negative for dysuria, flank pain and hematuria.  Musculoskeletal:  Positive for myalgias.  Skin:  Negative for rash.  Neurological:  Positive for dizziness, weakness and headaches.  Psychiatric/Behavioral:  Positive for depression.   All other systems reviewed and are negative.  Past Medical History:  Diagnosis Date   AKI (acute kidney injury) 07/20/2022   Closed fracture dislocation of lumbar spine (HCC) 1987   COPD (chronic obstructive pulmonary disease) (HCC)    COVID-19 virus infection 07/20/2022   CVA (cerebral vascular accident) (HCC) 03/25/2022   Diabetes mellitus without complication (HCC)    GERD (gastroesophageal reflux disease)    High cholesterol    Homelessness 02/07/2019   Hypertension    History reviewed. No pertinent surgical history. History reviewed. No pertinent family history. Social History:  reports that he has been smoking cigarettes. He has never used smokeless tobacco. He reports current alcohol use of about 1.0 standard drink of alcohol per week. He reports that he does not currently use drugs. Allergies: Allergies[1] Medications Prior to Admission  Medication Sig Dispense Refill   Accu-Chek Softclix Lancets lancets Use to check blood sugar three times daily. 100 each 6   albuterol  (VENTOLIN  HFA) 108 (90 Base) MCG/ACT inhaler TAKE 2 PUFFS BY MOUTH EVERY 6 HOURS  AS NEEDED FOR WHEEZE OR SHORTNESS OF BREATH (Patient not taking: Reported on 10/26/2024) 18 each 2   amLODipine  (NORVASC ) 10 MG tablet Take 1 tablet (10 mg total)  by mouth daily. (Patient not taking: Reported on 10/26/2024) 30 tablet 0   aspirin  325 MG tablet Take 1 tablet (325 mg total) by mouth daily. (Patient not taking: Reported on 10/26/2024) 30 tablet 2   atorvastatin  (LIPITOR ) 80 MG tablet Take 1 tablet (80 mg total) by mouth every evening. (Patient not taking: Reported on 10/26/2024) 90 tablet 2   Blood Glucose Monitoring Suppl (ACCU-CHEK GUIDE) w/Device KIT Use to check blood sugar three times daily. 1 kit 0   carvedilol  (COREG ) 25 MG tablet Take 1 tablet (25 mg total) by mouth 2 (two) times daily with a meal. (Patient not taking: Reported on 10/26/2024) 120 tablet 3   cyanocobalamin  (VITAMIN B12) 1000 MCG tablet Take 1 tablet (1,000 mcg total) by mouth once daily. (Patient not taking: Reported on 10/26/2024) 130 tablet 2   dapagliflozin  propanediol (FARXIGA ) 10 MG TABS tablet Take 1 tablet (10 mg total) by mouth daily before breakfast. (Patient not taking: Reported on 10/26/2024) 30 tablet 2   Dulaglutide  (TRULICITY ) 1.5 MG/0.5ML SOPN Inject 1.5 mg into the skin once a week. (Patient not taking: Reported on 10/26/2024) 2 mL 4   fluticasone -salmeterol (ADVAIR  DISKUS) 100-50 MCG/ACT AEPB Inhale 1 puff into the lungs 2 (two) times daily. (Patient not taking: Reported on 10/26/2024) 60 each 2   glucose blood (ACCU-CHEK GUIDE) test strip Use to check blood sugar three times daily. 100 each 6   insulin  aspart (NOVOLOG ) 100 UNIT/ML injection Inject 0-20 Units into the skin 3 (three) times daily with meals. CBG < 70: treat low blood sugars  CBG 70 - 120: 0 units  CBG 121 - 150: 3 units  CBG 151 - 200: 4 units  CBG 201 - 250: 7 units  CBG 251 - 300: 11 units  CBG 301 - 350: 15 units  CBG 351 - 400: 20 units  CBG > 400: call MD     [START ON 10/31/2024] insulin  glargine (LANTUS ) 100 UNIT/ML injection Inject 0.36 mLs (36 Units total) into the skin daily.     Insulin  Pen Needle (PEN NEEDLES) 31G X 5 MM MISC use as directed in the morning, at noon, in the  evening, and at bedtime. 130 each 1   metFORMIN  (GLUCOPHAGE ) 500 MG tablet Take 2 tablets (1,000 mg total) by mouth daily with breakfast. (Patient not taking: Reported on 10/26/2024) 120 tablet 1   pantoprazole  (PROTONIX ) 40 MG tablet Take 1 tablet (40 mg total) by mouth once daily. (Patient not taking: Reported on 10/26/2024) 90 tablet 2   polyethylene glycol powder (GLYCOLAX /MIRALAX ) 17 GM/SCOOP powder Take 17 g by mouth 2 (two) times daily as needed. (Patient not taking: Reported on 10/26/2024) 1530 g 1   sertraline  (ZOLOFT ) 50 MG tablet Take 50 mg by mouth daily. (Patient not taking: Reported on 10/26/2024)     thiamine  (VITAMIN B1) 100 MG tablet Take 1 tablet (100 mg total) by mouth once daily. (Patient not taking: Reported on 10/26/2024) 100 tablet 0   Home: Home Living Family/patient expects to be discharged to:: Unsure Available Help at Discharge: Family Type of Home: Other(Comment) Home Access: Level entry Home Layout: One level Bathroom Shower/Tub: Engineer, Manufacturing Systems: Standard Bathroom Accessibility: Yes Additional Comments: Motel  Lives With: Other (Comment)   Functional History: Prior Function Prior Level of Function : Independent/Modified Independent Mobility  Comments: walks with a rollator, takes the bus ADLs Comments: goes to the shelter to eat, independent in self care   Functional Status:  Mobility: Bed Mobility Overal bed mobility: Needs Assistance Bed Mobility: Rolling, Sidelying to Sit Rolling: Modified independent (Device/Increase time) Sidelying to sit: Contact guard assist, Used rails Supine to sit: Min assist Sit to supine: Min assist General bed mobility comments: slight lightheadedness on sitting therefore CGA for safety Transfers Overall transfer level: Needs assistance Equipment used: Rolling walker (2 wheels) Transfers: Sit to/from Stand Sit to Stand: Contact guard assist Bed to/from chair/wheelchair/BSC transfer type:: Step  pivot Step pivot transfers: +2 physical assistance, Min assist, Mod assist General transfer comment: able to place and maintain L hand on walker handle; vc to use RUE to push up from surface Ambulation/Gait Ambulation/Gait assistance: Min assist Gait Distance (Feet): 100 Feet Assistive device: Rolling walker (2 wheels) Gait Pattern/deviations: Step-to pattern, Decreased stride length, Knee flexed in stance - right, Knee flexed in stance - left, Knees buckling General Gait Details: light support due to quick release/buckling of bil Knees followed by catching and returning to upright; LLE slightly worse than RLE; did not improve or worsen with incr distance/time Gait velocity: decr   ADL: ADL Overall ADL's : Needs assistance/impaired Eating/Feeding: Set up, Sitting Grooming: Wash/dry hands, Wash/dry face, Sitting, Set up, Brushing hair Upper Body Bathing: Moderate assistance, Sitting Lower Body Bathing: +2 for physical assistance, Sit to/from stand, Maximal assistance Upper Body Dressing : Moderate assistance, Sitting Lower Body Dressing: Total assistance, Sitting/lateral leans Lower Body Dressing Details (indicate cue type and reason): for shoes Functional mobility during ADLs: +2 for physical assistance, Minimal assistance, Moderate assistance, Rolling walker (2 wheels)   Cognition: Cognition Overall Cognitive Status: Within Functional Limits for tasks assessed (suspect near baseline, will need further assessment of higher level function) Orientation Level: Oriented X4 Cognition Arousal: Alert Behavior During Therapy: WFL for tasks assessed/performed Overall Cognitive Status: Within Functional Limits for tasks assessed (suspect near baseline, will need further assessment of higher level function)      Physical Exam: Blood pressure 115/70, pulse 70, temperature 98.4 F (36.9 C), temperature source Oral, resp. rate 19, height 5' 5 (1.651 m), weight 81 kg, SpO2 95%. Gen: no  distress, normal appearing HEENT: oral mucosa pink and moist, NCAT, legally blind Cardio: Reg rate Chest: normal effort, normal rate of breathing Abd: soft, non-distended Ext: no edema Psych: pleasant, normal affect Skin: intact Neuro: Alert and oriented x3. Follows commands well. LUE 2/5 strength, LLE 3/5 strength, sensation is decreased on left side, mildly elevated tone on left side, impaired finger to nose on left side   Results for orders placed or performed during the hospital encounter of 10/30/24 (from the past 48 hours)  Glucose, capillary     Status: Abnormal   Collection Time: 10/30/24  5:32 PM  Result Value Ref Range   Glucose-Capillary 217 (H) 70 - 99 mg/dL    Comment: Glucose reference range applies only to samples taken after fasting for at least 8 hours.   No results found.    Blood pressure 115/70, pulse 70, temperature 98.4 F (36.9 C), temperature source Oral, resp. rate 19, height 5' 5 (1.651 m), weight 81 kg, SpO2 95%.  Medical Problem List and Plan: 1. Functional deficits secondary to likely recrudescence of old stroke symptoms(remote left basal ganglia, left frontal and left PCA territory infarct) due to headache and hypertensive emergency  -patient may shower  -ELOS/Goals: 5-7 days S  Admit to CIR  Chart and therapy notes reviewed, continue CIR, discussed patient's progress with therapy Grounds pass ordered Vitamin D3/Metanx/Vitamin B/C complex ordered F/u with me or Fidela in clinic within 1 month Would benefit from home health aide upon discharge   2.  Antithrombotics: -DVT/anticoagulation:  Pharmaceutical: Lovenox   -antiplatelet therapy: Aspirin  325 mg daily 3. Pain Management: Tylenol  as needed 4. Mood/Behavior/Sleep: Zoloft  50 mg daily  -antipsychotic agents: N/A 5. Neuropsych/cognition: This patient is capable of making decisions on his own behalf. 6. Skin/Wound Care: Routine skin checks 7. Fluids/Electrolytes/Nutrition: Routine ins and outs  with follow-up chemistries 8.  Right parotid mass.  Follow-up outpatient Dr. Roark for needle aspiration  9.  Hypertension: decrease Norvasc  to 5mg  daily. Coreg  25 mg twice daily.  Monitor with increased mobility  10.  Hyperlipidemia.  Lipitor   11.  Diabetes mellitus.  Hemoglobin A1c 9.6.  Lantus  insulin  36 units daily, NovoLog  6 units 3 times daily, magnesium  supplement started  12.  History of tobacco alcohol and polysubstance abuse.  Urine drug screen negative.  NicoDerm patch.  Provide counseling  13.  GERD: continue Protonix   14.  Medical noncompliance.  Counseling  15.  Constipation: LBM 12/17, decrease miralax  to daily  I have personally performed a face to face diagnostic evaluation, including, but not limited to relevant history and physical exam findings, of this patient and developed relevant assessment and plan.  Additionally, I have reviewed and concur with the physician assistant's documentation above.  Rolan Pitch, PA-C  Faatimah Spielberg P Jaretzy Lhommedieu, MD 10/30/2024      [1]  Allergies Allergen Reactions   Fish Allergy Anaphylaxis, Other (See Comments) and Swelling    Throat swells   Penicillins Anaphylaxis, Swelling and Other (See Comments)    Throat swells   Shellfish Allergy Anaphylaxis, Swelling and Other (See Comments)    Throat swells

## 2024-10-30 NOTE — Progress Notes (Signed)
 Inpatient Rehab Admissions Coordinator:    I met with Pt. To discuss potential CIR admit. Pt. Is interested. He was living independently in motel [place], but daughter Amy states Pt. Can live with her. She works from home and can be with him most of the time. I will follow for potential admit pending readiness.   Leita Kleine, MS, CCC-SLP Rehab Admissions Coordinator  [phone] (celll) [phone] (office)

## 2024-10-30 NOTE — Discharge Instructions (Addendum)
 Inpatient Rehab Discharge Instructions  Stanley Stone Discharge date and time: No discharge date for patient encounter.   Activities/Precautions/ Functional Status: Activity: As tolerated Diet: Diabetic diet Wound Care: Routine skin checks Functional status:  ___ No restrictions     ___ Walk up steps independently ___ 24/7 supervision/assistance   ___ Walk up steps with assistance ___ Intermittent supervision/assistance  ___ Bathe/dress independently ___ Walk with walker     _x__ Bathe/dress with assistance ___ Walk Independently    ___ Shower independently ___ Walk with assistance    ___ Shower with assistance ___ No alcohol     ___ Return to work/school ________  Special Instructions: No driving smoking or illicit drug use  COMMUNITY REFERRALS UPON DISCHARGE:   Outpatient: PT     OT                 Agency: [facility] Phone: [phone]              Appointment Date/Time: *Please expect follow-up within 7-10 business days to schedule your appointment. If you have not received follow-up, be sure to contact the site directly.*  Medical Equipment/Items Ordered: TTB and Rollator                                                 Agency/Supplier: [facility]   My questions have been answered and I understand these instructions. I will adhere to these goals and the provided educational materials after my discharge from the hospital.  Patient/Caregiver Signature _______________________________ Date __________  Clinician Signature _______________________________________ Date __________  Please bring this form and your medication list with you to all your follow-up doctor's appointments. STROKE/TIA DISCHARGE INSTRUCTIONS SMOKING Cigarette smoking nearly doubles your risk of having a stroke & is the single most alterable risk factor  If you smoke or have smoked in the last 12 months, you are advised to quit smoking for your health. Most of the excess cardiovascular risk  related to smoking disappears within a year of stopping. Ask you doctor about anti-smoking medications [place] Quit Line: 1-800-QUIT NOW Free Smoking Cessation Classes [phone]  CHOLESTEROL Know your levels; limit fat & cholesterol in your diet  Lipid Panel     Component Value Date/Time   CHOL 195 10/25/2024 0735   CHOL 233 (H) 10/27/2022 1018   TRIG 142 10/25/2024 0735   HDL 38 (L) 10/25/2024 0735   HDL 54 10/27/2022 1018   CHOLHDL 5.1 10/25/2024 0735   VLDL 28 10/25/2024 0735   LDLCALC 129 (H) 10/25/2024 0735   LDLCALC 150 (H) 10/27/2022 1018     Many patients benefit from treatment even if their cholesterol is at goal. Goal: Total Cholesterol (CHOL) less than 160 Goal:  Triglycerides (TRIG) less than 150 Goal:  HDL greater than 40 Goal:  LDL (LDLCALC) less than 100   BLOOD PRESSURE American Stroke Association blood pressure target is less that 120/80 mm/Hg  Your discharge blood pressure is:  BP: 107/73 Monitor your blood pressure Limit your salt and alcohol intake Many individuals will require more than one medication for high blood pressure  DIABETES (A1c is a blood sugar average for last 3 months) Goal HGBA1c is under 7% (HBGA1c is blood sugar average for last 3 months)  Diabetes:    Lab Results  Component Value Date   HGBA1C 9.6 (H) 10/25/2024  Your HGBA1c can be lowered with medications, healthy diet, and exercise. Check your blood sugar as directed by your physician Call your physician if you experience unexplained or low blood sugars.  PHYSICAL ACTIVITY/REHABILITATION Goal is 30 minutes at least 4 days per week  Activity: Increase activity slowly, Therapies: Physical Therapy: Home Health Return to work:  Activity decreases your risk of heart attack and stroke and makes your heart stronger.  It helps control your weight and blood pressure; helps you relax and can improve your mood. Participate in a regular exercise program. Talk with your doctor about the best  form of exercise for you (dancing, walking, swimming, cycling).  DIET/WEIGHT Goal is to maintain a healthy weight  Your discharge diet is:  Diet Order             Diet Carb Modified Room service appropriate? Yes  Diet effective now                   liquids Your height is:  Height: 5' 5 (165.1 cm) Your current weight is: Weight: 81 kg Your Body Mass Index (BMI) is:  BMI (Calculated): 29.72 Following the type of diet specifically designed for you will help prevent another stroke. Your goal weight range is:   Your goal Body Mass Index (BMI) is 19-24. Healthy food habits can help reduce 3 risk factors for stroke:  High cholesterol, hypertension, and excess weight.  RESOURCES Stroke/Support Group:  Call [phone]   STROKE EDUCATION PROVIDED/REVIEWED AND GIVEN TO PATIENT Stroke warning signs and symptoms How to activate emergency medical system (call 911). Medications prescribed at discharge. Need for follow-up after discharge. Personal risk factors for stroke. Pneumonia vaccine given: No Flu vaccine given: No My questions have been answered, the writing is legible, and I understand these instructions.  I will adhere to these goals & educational materials that have been provided to me after my discharge from the hospital.

## 2024-10-30 NOTE — Plan of Care (Signed)
°  Problem: Education: Goal: Knowledge of disease or condition will improve Outcome: Progressing Goal: Knowledge of secondary prevention will improve (MUST DOCUMENT ALL) Outcome: Progressing Goal: Knowledge of patient specific risk factors will improve (DELETE if not current risk factor) Outcome: Progressing   Problem: Ischemic Stroke/TIA Tissue Perfusion: Goal: Complications of ischemic stroke/TIA will be minimized Outcome: Progressing   Problem: Coping: Goal: Will verbalize positive feelings about self Outcome: Progressing Goal: Will identify appropriate support needs Outcome: Progressing   Problem: Health Behavior/Discharge Planning: Goal: Ability to manage health-related needs will improve Outcome: Progressing Goal: Goals will be collaboratively established with patient/family Outcome: Progressing   Problem: Self-Care: Goal: Ability to participate in self-care as condition permits will improve Outcome: Progressing Goal: Verbalization of feelings and concerns over difficulty with self-care will improve Outcome: Progressing Goal: Ability to communicate needs accurately will improve Outcome: Progressing   Problem: Nutrition: Goal: Risk of aspiration will decrease Outcome: Progressing Goal: Dietary intake will improve Outcome: Progressing   Problem: Education: Goal: Knowledge of General Education information will improve Description: Including pain rating scale, medication(s)/side effects and non-pharmacologic comfort measures Outcome: Progressing   Problem: Health Behavior/Discharge Planning: Goal: Ability to manage health-related needs will improve Outcome: Progressing   Problem: Clinical Measurements: Goal: Ability to maintain clinical measurements within normal limits will improve Outcome: Progressing Goal: Will remain free from infection Outcome: Progressing Goal: Diagnostic test results will improve Outcome: Progressing Goal: Respiratory complications will  improve Outcome: Progressing Goal: Cardiovascular complication will be avoided Outcome: Progressing   Problem: Activity: Goal: Risk for activity intolerance will decrease Outcome: Progressing   Problem: Nutrition: Goal: Adequate nutrition will be maintained Outcome: Progressing   Problem: Coping: Goal: Level of anxiety will decrease Outcome: Progressing   Problem: Elimination: Goal: Will not experience complications related to bowel motility Outcome: Progressing Goal: Will not experience complications related to urinary retention Outcome: Progressing   Problem: Pain Managment: Goal: General experience of comfort will improve and/or be controlled Outcome: Progressing   Problem: Safety: Goal: Ability to remain free from injury will improve Outcome: Progressing   Problem: Skin Integrity: Goal: Risk for impaired skin integrity will decrease Outcome: Progressing   Problem: Education: Goal: Ability to describe self-care measures that may prevent or decrease complications (Diabetes Survival Skills Education) will improve Outcome: Progressing Goal: Individualized Educational Video(s) Outcome: Progressing   Problem: Coping: Goal: Ability to adjust to condition or change in health will improve Outcome: Progressing   Problem: Fluid Volume: Goal: Ability to maintain a balanced intake and output will improve Outcome: Progressing   Problem: Health Behavior/Discharge Planning: Goal: Ability to identify and utilize available resources and services will improve Outcome: Progressing Goal: Ability to manage health-related needs will improve Outcome: Progressing   Problem: Metabolic: Goal: Ability to maintain appropriate glucose levels will improve Outcome: Progressing   Problem: Nutritional: Goal: Maintenance of adequate nutrition will improve Outcome: Progressing Goal: Progress toward achieving an optimal weight will improve Outcome: Progressing

## 2024-10-30 NOTE — Progress Notes (Signed)
 Inpatient Rehab Admissions Coordinator:   I have a CIR bed for this Pt. RN may call report to [phone].  Pt to admit to CIR fore estimated 5-7 days with the goal of dc home with his daughter, Amy.   Leita Kleine, MS, CCC-SLP Rehab Admissions Coordinator  [phone] (celll) [phone] (office)

## 2024-10-30 NOTE — Progress Notes (Signed)
 Occupational Therapy Treatment Patient Details Name: Stanley Stone MRN: [id number] DOB: 1966-08-11 Today's Date: 10/30/2024   History of present illness Pt is a 58 year old man who presented on 10/24/24 with headache, worsening vision, slurred speech and L sided weakness. Per neurology note, MRI+ for punctual midline stroke at pontomedullary junction and R parotid mass. PMH: legally blind, alcohol abuse, medical noncompliance, CVA, DM, HLD, COPD, depression, homelessness.   OT comments  Pt with good functional progression toward established OT goals. Pt continues to report vision decreased as compared to baseline, but poor ability to participate in formal visual screen due to low vision (pt reports only seeing shadows at baseline). Pt able to locate items at sink and detect OT reaching out to shake hand, not observed to run into obstacles in hall. Pt needing up to min A for balance during ambulatory transfers this session and CGA for grooming at sink. Recommending intensive multidisciplinary rehabilitation >3 hours/day to optimize safety and independence in ADL.        If plan is discharge home, recommend the following:  A little help with walking and/or transfers;A lot of help with bathing/dressing/bathroom;Assistance with cooking/housework;Assist for transportation;Help with stairs or ramp for entrance;Direct supervision/assist for medications management;Direct supervision/assist for financial management   Equipment Recommendations  Other (comment) (defer)    Recommendations for Other Services      Precautions / Restrictions Precautions Precautions: Fall Recall of Precautions/Restrictions: Intact Precaution/Restrictions Comments: low vision Restrictions Weight Bearing Restrictions Per Provider Order: No       Mobility Bed Mobility               General bed mobility comments: OOB in recliner    Transfers Overall transfer level: Needs assistance Equipment used: Rolling  walker (2 wheels) Transfers: Sit to/from Stand Sit to Stand: Contact guard assist           General transfer comment: able to place and maintain L hand on walker handle; vc to use RUE to push up from surface     Balance Overall balance assessment: Needs assistance   Sitting balance-Leahy Scale: Fair     Standing balance support: Bilateral upper extremity supported, Reliant on assistive device for balance Standing balance-Leahy Scale: Poor Standing balance comment: and external support                           ADL either performed or assessed with clinical judgement   ADL Overall ADL's : Needs assistance/impaired     Grooming: Oral care;Contact guard assist;Standing                                      Extremity/Trunk Assessment Upper Extremity Assessment Upper Extremity Assessment: LUE deficits/detail LUE Deficits / Details: pt reports his LUE is near baseline used RUE to place it on RW; able to use LUE as helper hand during BADL standing at sink LUE Sensation: decreased light touch LUE Coordination: decreased fine motor;decreased gross motor   Lower Extremity Assessment Lower Extremity Assessment: Defer to PT evaluation        Vision       Perception     Praxis     Communication Communication Communication: Impaired Factors Affecting Communication: Reduced clarity of speech   Cognition Arousal: Alert Behavior During Therapy: WFL for tasks assessed/performed Cognition: Cognition impaired     Awareness: Intellectual awareness intact, Online awareness  impaired Memory impairment (select all impairments): Short-term memory                       Following commands: Impaired Following commands impaired: Follows one step commands with increased time      Cueing   Cueing Techniques: Verbal cues, Tactile cues  Exercises      Shoulder Instructions       General Comments      Pertinent Vitals/ Pain       Pain  Assessment Pain Assessment: No/denies pain  Home Living                                          Prior Functioning/Environment              Frequency  Min 2X/week        Progress Toward Goals  OT Goals(current goals can now be found in the care plan section)  Progress towards OT goals: Progressing toward goals     Plan      Co-evaluation                 AM-PAC OT 6 Clicks Daily Activity     Outcome Measure   Help from another person eating meals?: A Little Help from another person taking care of personal grooming?: A Little Help from another person toileting, which includes using toliet, bedpan, or urinal?: A Lot Help from another person bathing (including washing, rinsing, drying)?: A Lot Help from another person to put on and taking off regular upper body clothing?: A Lot Help from another person to put on and taking off regular lower body clothing?: Total 6 Click Score: 13    End of Session Equipment Utilized During Treatment: Gait belt;Rolling walker (2 wheels)  OT Visit Diagnosis: Unsteadiness on feet (R26.81);Other symptoms and signs involving cognitive function;Muscle weakness (generalized) (M62.81);Hemiplegia and hemiparesis Hemiplegia - Right/Left: Left Hemiplegia - dominant/non-dominant: Non-Dominant Hemiplegia - caused by: Cerebral infarction   Activity Tolerance Patient tolerated treatment well   Patient Left in chair;with call bell/phone within reach;with chair alarm set   Nurse Communication Mobility status        Time: 8369-8353 OT Time Calculation (min): 16 min  Charges: OT General Charges $OT Visit: 1 Visit OT Treatments $Self Care/Home Management : 8-22 mins  Elma JONETTA Lebron FREDERICK, OTR/L [facility] Office: [phone]   Elma JONETTA Lebron 10/30/2024, 4:30 PM

## 2024-10-31 ENCOUNTER — Inpatient Hospital Stay (HOSPITAL_COMMUNITY)

## 2024-10-31 DIAGNOSIS — I639 Cerebral infarction, unspecified: Secondary | ICD-10-CM | POA: Diagnosis not present

## 2024-10-31 LAB — COMPREHENSIVE METABOLIC PANEL WITH GFR
ALT: 35 U/L (ref 0–44)
AST: 25 U/L (ref 15–41)
Albumin: 3.8 g/dL (ref 3.5–5.0)
Alkaline Phosphatase: 78 U/L (ref 38–126)
Anion gap: 9 (ref 5–15)
BUN: 23 mg/dL — ABNORMAL HIGH (ref 6–20)
CO2: 29 mmol/L (ref 22–32)
Calcium: 9.1 mg/dL (ref 8.9–10.3)
Chloride: 102 mmol/L (ref 98–111)
Creatinine, Ser: 1.26 mg/dL — ABNORMAL HIGH (ref 0.61–1.24)
GFR, Estimated: >60 mL/min (ref >60)
Glucose, Bld: 105 mg/dL — ABNORMAL HIGH (ref 70–99)
Potassium: 4 mmol/L (ref 3.5–5.1)
Sodium: 140 mmol/L (ref 135–145)
Total Bilirubin: 0.6 mg/dL (ref 0.0–1.2)
Total Protein: 6.2 g/dL — ABNORMAL LOW (ref 6.5–8.1)

## 2024-10-31 LAB — GLUCOSE, CAPILLARY
Glucose-Capillary: 122 mg/dL — ABNORMAL HIGH (ref 70–99)
Glucose-Capillary: 164 mg/dL — ABNORMAL HIGH (ref 70–99)
Glucose-Capillary: 284 mg/dL — ABNORMAL HIGH (ref 70–99)
Glucose-Capillary: 247 mg/dL — ABNORMAL HIGH (ref 70–99)

## 2024-10-31 LAB — CBC WITH DIFFERENTIAL/PLATELET
Abs Immature Granulocytes: 0.03 K/uL (ref 0.00–0.07)
Basophils Absolute: 0 K/uL (ref 0.0–0.1)
Basophils Relative: 0 %
Eosinophils Absolute: 0.2 K/uL (ref 0.0–0.5)
Eosinophils Relative: 2 %
HCT: 42.7 % (ref 39.0–52.0)
Hemoglobin: 14.6 g/dL (ref 13.0–17.0)
Immature Granulocytes: 0 %
Lymphocytes Relative: 23 %
Lymphs Abs: 2.1 K/uL (ref 0.7–4.0)
MCH: 30.4 pg (ref 26.0–34.0)
MCHC: 34.2 g/dL (ref 30.0–36.0)
MCV: 88.8 fL (ref 80.0–100.0)
Monocytes Absolute: 1 K/uL (ref 0.1–1.0)
Monocytes Relative: 11 %
Neutro Abs: 5.7 K/uL (ref 1.7–7.7)
Neutrophils Relative %: 64 %
Platelets: 208 K/uL (ref 150–400)
RBC: 4.81 MIL/uL (ref 4.22–5.81)
RDW: 13 % (ref 11.5–15.5)
WBC: 9 K/uL (ref 4.0–10.5)
nRBC: 0 % (ref 0.0–0.2)

## 2024-10-31 MED ORDER — AMLODIPINE BESYLATE 2.5 MG PO TABS
2.5000 mg | ORAL_TABLET | Freq: Every day | ORAL | Status: DC
  Administered 2024-11-01: 2.5 mg via ORAL
  Filled 2024-10-31: qty 1

## 2024-10-31 NOTE — Inpatient Diabetes Management (Signed)
 Inpatient Diabetes Program Recommendations  AACE/ADA: New Consensus Statement on Inpatient Glycemic Control (2015)  Target Ranges:  Prepandial:   less than 140 mg/dL      Peak postprandial:   less than 180 mg/dL (1-2 hours)      Critically ill patients:  140 - 180 mg/dL   Lab Results  Component Value Date   GLUCAP 164 (H) 10/31/2024   HGBA1C 9.6 (H) 10/25/2024    Review of Glycemic Control  Latest Reference Range & Units 10/30/24 16:26 10/30/24 17:32 10/31/24 06:18 10/31/24 11:53  Glucose-Capillary 70 - 99 mg/dL 790 (H) 782 (H) 877 (H) 164 (H)   Diabetes history: DM 2 Outpatient Diabetes medications:  Patient was not taking medications prior to hospitalization (including long acting insulin ) Current orders for Inpatient glycemic control:  Novolog  0-20 units tid with meals Lantus  36 units daily Novolog  6 units tid with meals  Inpatient Diabetes Program Recommendations:    Spoke to patient while in the hospital.  He stated that he could not see to administer insulin  prior to admit. I showed him insulin  pen and he was able to see the number when he held the pen very closely to his face.   He was living in [place] and likely has food insecurity as well.  I discussed that he likely would benefit from a magnifying glass for seeing the insulin  pens.  Also discussed with him that he may need to have family help him with insulin  administration and monitoring.   Will follow and also will ask OT/ST about potential aids for helping him be able to see such as magnifying glass, etc.   Thanks,  Randall Bullocks, RN, BC-ADM Inpatient Diabetes Coordinator Pager [phone]  (8a-5p)

## 2024-10-31 NOTE — Evaluation (Signed)
 Occupational Therapy Assessment and Plan  Patient Details  Name: Stanley Stone MRN: [id number] Date of Birth: Jul 15, 1966  OT Diagnosis: abnormal posture, acute pain, hemiplegia affecting non-dominant side, and muscle weakness (generalized) Rehab Potential: Rehab Potential (ACUTE ONLY): Good ELOS: 10-12 Days   Today's Date: 10/31/2024 OT Individual Time: 8952-8840 OT Individual Time Calculation (min): 72 min     Hospital Problem: Principal Problem:   Infarction of left basal ganglia (HCC)   Past Medical History:  Past Medical History:  Diagnosis Date   AKI (acute kidney injury) 07/20/2022   Closed fracture dislocation of lumbar spine (HCC) 1987   COPD (chronic obstructive pulmonary disease) (HCC)    COVID-19 virus infection 07/20/2022   CVA (cerebral vascular accident) (HCC) 03/25/2022   Diabetes mellitus without complication (HCC)    GERD (gastroesophageal reflux disease)    High cholesterol    Homelessness 02/07/2019   Hypertension    Past Surgical History: History reviewed. No pertinent surgical history.  Assessment & Plan Clinical Impression: Stanley Stone is a 58 year old right-handed male with history significant for COPD/tobacco use, alcohol and polysubstance use , diabetes mellitus, hypertension, hyperlipidemia, GERD, medical noncompliance, depression as well as CVA with residual left-sided weakness and legally blind. Per chart review patient has been staying in a motel living alone. Was using a rollator. He uses the bus for transportation. He goes to the shelter to eat. Independent in self-care. He does have a daughter in the area who he plans to stay with on discharge. Presented 10/24/2024 with reports of increased left-sided weakness worsening vision and slurred speech as well as increasing headache that got progressively worse over 2 days. Noted blood pressure 148/91. CT/MRI/CTA showed no acute intracranial abnormality. Remote left basal ganglia, left frontal and  left PCA territory infarct. CTA showed similar left M1 MCA occlusion with more distal reconstitution. 2.1 cm suspected right parotid mass with interval increase in abnormality heterogeneous, rounded and 1.1 cm right level 2 node. Admission chemistries urine drug screen negative, sodium 134, chloride 97, glucose 467, alcohol 66, hemoglobin A1c 9.6. Patient did not receive TNK. Echocardiogram ejection fraction of 55 to 60% no wall motion abnormalities grade 1 diastolic dysfunction. Follow-up ENT Dr. Norleen Notice for parotid mass and plan workup as outpatient with consideration for fine-needle aspiration. Neurology follow-up currently maintained on aspirin  325 mg daily. Lovenox  added for DVT prophylaxis. CVA felt likely recrudescence of old stroke symptoms due to headache and hypertensive emergency. Tolerating a regular diet. Therapy evaluations completed due to patient decreased functional mobility was admitted for a comprehensive rehab program.  Patient transferred to CIR on 10/30/2024 .    Patient currently requires min-mod with basic self-care skills secondary to muscle weakness, decreased cardiorespiratoy endurance, impaired timing and sequencing, unbalanced muscle activation, ataxia, decreased coordination, and decreased motor planning, decreased attention to right, decreased safety awareness, central origin, and decreased standing balance, decreased postural control, hemiplegia, and decreased balance strategies.  Prior to hospitalization, patient could complete ADLs with modified independent .  Patient will benefit from skilled intervention to decrease level of assist with basic self-care skills and increase independence with basic self-care skills prior to discharge home with care partner.  Anticipate patient will require 24 hour supervision and follow up home health.  OT - End of Session Activity Tolerance: Decreased this session Endurance Deficit: Yes OT Assessment Rehab Potential (ACUTE ONLY):  Good OT Barriers to Discharge: None OT Patient demonstrates impairments in the following area(s): Balance;Safety;Sensory;Endurance;Motor;Pain;Perception;Edema;Nutrition;Skin Integrity OT Basic ADL's Functional Problem(s): Bathing;Dressing;Toileting;Grooming OT Transfers Functional  Problem(s): Toilet;Tub/Shower OT Additional Impairment(s): Fuctional Use of Upper Extremity OT Plan OT Intensity: Minimum of 1-2 x/day, 45 to 90 minutes OT Frequency: 5 out of 7 days OT Duration/Estimated Length of Stay: 10-12 Days OT Treatment/Interventions: Balance/vestibular training;Neuromuscular re-education;Disease mangement/prevention;Self Care/advanced ADL retraining;Therapeutic Exercise;Cognitive remediation/compensation;DME/adaptive equipment instruction;Pain management;Skin care/wound managment;UE/LE Strength taining/ROM;Community reintegration;Functional electrical stimulation;Patient/family education;Splinting/orthotics;UE/LE Coordination activities;Discharge planning;Functional mobility training;Psychosocial support;Therapeutic Activities;Visual/perceptual remediation/compensation OT Self Feeding Anticipated Outcome(s): MOD I OT Basic Self-Care Anticipated Outcome(s): SUP-CGA OT Toileting Anticipated Outcome(s): CGA OT Bathroom Transfers Anticipated Outcome(s): CGA OT Recommendation Recommendations for Other Services: Therapeutic Recreation consult Therapeutic Recreation Interventions: Pet therapy Patient destination: Home Follow Up Recommendations: Home health OT Equipment Recommended: To be determined   OT Evaluation Precautions/Restrictions  Precautions Precautions: Fall Precaution/Restrictions Comments: legally blind, L hemi, ataxic Restrictions Weight Bearing Restrictions Per Provider Order: No General Chart Reviewed: Yes Additional Pertinent History: COPD/tobacco use, alcohol and polysubstance use , diabetes mellitus, hypertension, hyperlipidemia, GERD, medical noncompliance, depression  as well as CVA with residual left-sided weakness and legally blind Family/Caregiver Present: No Pain Pain Assessment Pain Scale: 0-10 Pain Score: 2  Pain Type: Acute pain Pain Location: Neck Pain Orientation: Right Home Living/Prior Functioning Home Living Family/patient expects to be discharged to:: Unsure Living Arrangements: Children Available Help at Discharge: Family Type of Home: House (Pt was living in a motel PTA; he will be d/c'ing home with his DTR. The followiung information reflects DTR's home set-up) Home Access: Level entry Home Layout: Two level Bathroom Shower/Tub: [employer] Accessibility: Yes Additional Comments: plans to d/c to daughers home which is 2 story (he will stay on first floor with full bathroom) and 1 STE  Lives With: Alone IADL History Homemaking Responsibilities: Yes Meal Prep Responsibility: No Laundry Responsibility: Primary Cleaning Responsibility: No Bill Paying/Finance Responsibility: Primary Shopping Responsibility: No Child Care Responsibility: No Homemaking Comments: He reports he manages his own medications, however he does not take them regularly Current License: No Mode of Transportation: Bus Education: 10th grade Occupation: On disability Leisure and Hobbies: Sleeping Prior Function Level of Independence: Independent with basic ADLs, Independent with transfers, Independent with gait, Requires assistive device for independence  Able to Take Stairs?: Yes Driving: No Vocation: On disability Vision Baseline Vision/History: 2 Legally blind Ability to See in Adequate Light: 3 Highly impaired Patient Visual Report: No change from baseline Additional Comments: Pt leagally blind at baseline reporting no changes in his vision Perception  Perception: Impaired Perception-Other Comments: Pt tends to maintain R rotation in neck Praxis Praxis: Impaired Praxis Impairment Details: Motor  planning Cognition Cognition Overall Cognitive Status: Within Functional Limits for tasks assessed (presents to be close to baseline for BADLs) Arousal/Alertness: Awake/alert Orientation Level: Person;Place;Situation Person: Oriented Place: Oriented Situation: Oriented Memory: Impaired Memory Impairment: Storage deficit;Retrieval deficit Attention: Sustained Sustained Attention: Appears intact Awareness: Appears intact Problem Solving: Appears intact Safety/Judgment: Appears intact Brief Interview for Mental Status (BIMS) Repetition of Three Words (First Attempt): 3 Temporal Orientation: Year: Correct Temporal Orientation: Month: Accurate within 5 days Temporal Orientation: Day: Correct Recall: Sock: Yes, no cue required Recall: Blue: Yes, no cue required Recall: Bed: Yes, no cue required BIMS Summary Score: 15 Sensation Sensation Light Touch: Impaired Detail Light Touch Impaired Details: Impaired LLE;Impaired LUE Proprioception: Impaired Detail Proprioception Impaired Details: Impaired LUE;Impaired LLE Coordination Gross Motor Movements are Fluid and Coordinated: No Fine Motor Movements are Fluid and Coordinated: No Coordination and Movement Description: ataxia present in trunk and L hemibody Motor  Motor Motor: Abnormal tone;Ataxia;Abnormal postural alignment  and control;Hemiplegia Motor - Skilled Clinical Observations: L hemi, ataxia noted with standing  Trunk/Postural Assessment  Cervical Assessment Cervical Assessment: Exceptions to [facility] (pain in R side of neck during rotation) Thoracic Assessment Thoracic Assessment: Exceptions to [facility] (mildly flexed posture) Lumbar Assessment Lumbar Assessment: Exceptions to [facility] (posterior pelvic tilt) Postural Control Postural Control: Deficits on evaluation (trunk ataxia, decreased balance strategies)  Balance Balance Balance Assessed: Yes Standardized Balance Assessment Standardized Balance Assessment: Timed Up and Go  Test Timed Up and Go Test TUG: Normal TUG Normal TUG (seconds): 68 (with RW) Static Sitting Balance Static Sitting - Balance Support: Feet unsupported Static Sitting - Level of Assistance: 5: Stand by assistance Dynamic Sitting Balance Dynamic Sitting - Balance Support: Feet unsupported Dynamic Sitting - Level of Assistance: 5: Stand by assistance Static Standing Balance Static Standing - Balance Support: During functional activity;Bilateral upper extremity supported Static Standing - Level of Assistance: 4: Min assist Dynamic Standing Balance Dynamic Standing - Balance Support: Bilateral upper extremity supported;During functional activity Dynamic Standing - Level of Assistance: 3: Mod assist Extremity/Trunk Assessment RUE Assessment RUE Assessment: Within Functional Limits LUE Assessment LUE Assessment: Exceptions to [facility] General Strength Comments: 3+/5 overall; combination of residual L side weakness and new onset of weakness following CVA; pain at shoulder during shoudler flexion  Care Tool Care Tool Self Care Eating   Eating Assist Level: Set up assist    Oral Care    Oral Care Assist Level: Set up assist    Bathing   Body parts bathed by patient: Left arm;Right arm;Chest;Abdomen;Front perineal area;Right upper leg;Left upper leg;Face Body parts bathed by helper: Right arm;Buttocks;Left lower leg;Right lower leg   Assist Level: Moderate Assistance - Patient 50 - 74%    Upper Body Dressing(including orthotics)   What is the patient wearing?: Hospital gown only   Assist Level: Minimal Assistance - Patient > 75%    Lower Body Dressing (excluding footwear)   What is the patient wearing?: Pants;Underwear/pull up Assist for lower body dressing: Moderate Assistance - Patient 50 - 74%    Putting on/Taking off footwear   What is the patient wearing?: Shoes Assist for footwear: Maximal Assistance - Patient 25 - 49%       Care Tool Toileting Toileting activity   Assist  for toileting: Moderate Assistance - Patient 50 - 74%     Care Tool Bed Mobility Roll left and right activity   Roll left and right assist level: Supervision/Verbal cueing    Sit to lying activity   Sit to lying assist level: Contact Guard/Touching assist    Lying to sitting on side of bed activity   Lying to sitting on side of bed assist level: the ability to move from lying on the back to sitting on the side of the bed with no back support.: Contact Guard/Touching assist     Care Tool Transfers Sit to stand transfer   Sit to stand assist level: Minimal Assistance - Patient > 75%    Chair/bed transfer   Chair/bed transfer assist level: Moderate Assistance - Patient 50 - 74%     Toilet transfer   Assist Level: Moderate Assistance - Patient 50 - 74%     Care Tool Cognition  Expression of Ideas and Wants Expression of Ideas and Wants: 3. Some difficulty - exhibits some difficulty with expressing needs and ideas (e.g, some words or finishing thoughts) or speech is not clear  Understanding Verbal and Non-Verbal Content Understanding Verbal and Non-Verbal Content: 4. Understands (complex  and basic) - clear comprehension without cues or repetitions   Memory/Recall Ability Memory/Recall Ability : Current season   Refer to Care Plan for Long Term Goals  SHORT TERM GOAL WEEK 1 OT Short Term Goal 1 (Week 1): Pt will tolerate standing using LRAD for >2 min during ADLs for functional activity OT Short Term Goal 2 (Week 1): Pt will complete toilet transfer MIN A using LRAD consistently OT Short Term Goal 3 (Week 1): Pt will complete LB dressing CGA using LRAD OT Short Term Goal 4 (Week 1): Pt will complete toileting with CGA using LRAD OT Short Term Goal 5 (Week 1): Pt will utilize L UE as a gross assist during ADLs with SUP  Recommendations for other services: Therapeutic Recreation  Pet therapy   Skilled Therapeutic Intervention Skilled Therapeutic Interventions/Progress Updates:   1:1 OT evaluation and intervention initiated with skilled education provided on OT role, goals, and POC. Pt received sitting up in recliner presenting to be fatigued, however in good spirits receptive to skilled OT session reporting 2/10 pain in R side of neck- OT offering intermittent rest breaks, repositioning, and therapeutic support to optimize participation in therapy session. Pt presenting with trunk and L hemibody ataxia with decreased L hemibody sensation and poor activity tolerance during session. He completed ADLs at levels listed below opting to complete sponge bath vs shower d/t fatigue. He required frequent seated rest breaks and fatigued after ~30 sec of standing. Education provided on CVA etiology and recovery process as well as modifiable risk factors. Pt was left resting in bed with call bell in reach, bed alarm on, and all needs met.    ADL ADL Eating: Set up Where Assessed-Eating: Chair Grooming: Setup Where Assessed-Grooming: Sitting at sink Upper Body Bathing: Minimal assistance Where Assessed-Upper Body Bathing: Sitting at sink Lower Body Bathing: Moderate assistance;Minimal assistance Where Assessed-Lower Body Bathing: Sitting at sink;Standing at sink Upper Body Dressing: Minimal assistance;Supervision/safety Where Assessed-Upper Body Dressing: Sitting at sink;Wheelchair Lower Body Dressing: Moderate assistance;Minimal assistance Where Assessed-Lower Body Dressing: Sitting at sink;Wheelchair Toileting: Minimal assistance;Moderate assistance Where Assessed-Toileting: Bedside Commode Toilet Transfer: Minimal assistance;Moderate assistance Toilet Transfer Method: Stand pivot Toilet Transfer Equipment: Gaffer: Not assessed Film/video Editor: Not assessed Mobility  Bed Mobility Bed Mobility: Rolling Right;Supine to Sit;Sit to Supine Rolling Right: Supervision/verbal cueing Supine to Sit: Contact Guard/Touching assist Sit to Supine:  Contact Guard/Touching assist Transfers Sit to Stand: Minimal Assistance - Patient > 75% Stand to Sit: Minimal Assistance - Patient > 75%   Discharge Criteria: Patient will be discharged from OT if patient refuses treatment 3 consecutive times without medical reason, if treatment goals not met, if there is a change in medical status, if patient makes no progress towards goals or if patient is discharged from hospital.  The above assessment, treatment plan, treatment alternatives and goals were discussed and mutually agreed upon: by patient  Katheryn SHAUNNA Mines 10/31/2024, 11:38 AM

## 2024-10-31 NOTE — Progress Notes (Signed)
 Inpatient Rehabilitation Admission Medication Review by a Pharmacist  A complete drug regimen review was completed for this patient to identify any potential clinically significant medication issues.  High Risk Drug Classes Is patient taking? Indication by Medication  Antipsychotic No   Anticoagulant Yes Lovenox - vte ppx  Antibiotic No   Opioid No   Antiplatelet Yes Aspirin - cva ppx  Hypoglycemics/insulin  Yes Insulin - DM  Vasoactive Medication Yes Coreg - HTN Norvasc - HTN  Chemotherapy No   Other Yes Zoloft - MDD Protonix - GERD Lipitor - HLD     Type of Medication Issue Identified Description of Issue Recommendation(s)  Drug Interaction(s) (clinically significant)     Duplicate Therapy     Allergy     No Medication Administration End Date     Incorrect Dose     Additional Drug Therapy Needed     Significant med changes from prior encounter (inform family/care partners about these prior to discharge).    Other       Clinically significant medication issues were identified that warrant physician communication and completion of prescribed/recommended actions by midnight of the next day:  No   Time spent performing this drug regimen review (minutes):  30    Veronia Laprise BS, PharmD, BCPS Clinical Pharmacist 10/31/2024 7:12 AM  Contact: [phone] after 3 PM

## 2024-10-31 NOTE — Progress Notes (Signed)
 Inpatient Rehabilitation Care Coordinator Assessment and Plan Patient Details  Name: Stanley Stone MRN: [id number] Date of Birth: [birth year]  Today's Date: 10/31/2024  Hospital Problems: Principal Problem:   Infarction of left basal ganglia [facility])  Past Medical History:  Past Medical History:  Diagnosis Date   AKI (acute kidney injury) 07/20/2022   Closed fracture dislocation of lumbar spine (HCC) 1987   COPD (chronic obstructive pulmonary disease) (HCC)    COVID-19 virus infection 07/20/2022   CVA (cerebral vascular accident) (HCC) 03/25/2022   Diabetes mellitus without complication (HCC)    GERD (gastroesophageal reflux disease)    High cholesterol    Homelessness 02/07/2019   Hypertension    Past Surgical History: History reviewed. No pertinent surgical history. Social History:  reports that he has been smoking cigarettes. He has never used smokeless tobacco. He reports current alcohol use of about 1.0 standard drink of alcohol per week. He reports that he does not currently use drugs.  Family / Support Systems Marital Status: Divorced Patient Roles: Parent, Other (Comment) Children: Stanley Stone Anticipated Caregiver: Stanley Stone Ability/Limitations of Caregiver: Daughter works from home, can be with the patient. There are 3 teenagers that also live in the home. Pt will be alone for a few hours at a time sometimes Caregiver Availability: Other (Comment) Family Dynamics: Family supportive  Social History Preferred language: English Religion: Forensic Psychologist - How often do you need to have someone help you when you read instructions, pamphlets, or other written material from your doctor or pharmacy?: Always Writes: No Employment Status: Unemployed   Abuse/Neglect Abuse/Neglect Assessment Can Be Completed: Yes Physical Abuse: Denies Verbal Abuse: Denies Sexual Abuse: Denies Exploitation of patient/patient's resources: Denies Self-Neglect: Denies  Patient  response to: Social Isolation - How often do you feel lonely or isolated from those around you?: Never  Emotional Status Pt's affect, behavior and adjustment status: Adjusting to therapy Recent Psychosocial Issues: None Psychiatric History: None Substance Abuse History: Current smoker and drinker - doesnt plan on continuing following discharge  Patient / Family Perceptions, Expectations & Goals Pt/Family understanding of illness & functional limitations: Patient needs continuous education about illness & functional limitations Premorbid pt/family roles/activities: Lived alone and has a small family. 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  [employer] Agencies: None Premorbid Home Care/DME Agencies: Other (Comment) (Has a rollator at home but the brakes are broken - will need another) Transportation available at discharge: Daughter will provide transportation home Is the patient able to respond to transportation needs?: Yes In the past 12 months, has lack of transportation kept you from medical appointments or from getting medications?: No In the past 12 months, has lack of transportation kept you from meetings, work, or from getting things needed for daily living?: No Resource referrals recommended: Neuropsychology (12/23)  Discharge Planning Living Arrangements: Children Support Systems: Children, Other relatives Type of Residence: Private residence Insurance Resources: [employer] (specify county) Surveyor, Quantity Resources: [employer] Financial Screen Referred: Yes Living Expenses: Lives with family Money Management: Patient Does the patient have any problems obtaining your medications?: Yes (Describe) Home Management: Will live with daughter following dishcarge Patient/Family Preliminary Plans: Plans to move in with daughter following dishcharge Care Coordinator  Anticipated Follow Up Needs: HH/OP Expected length of stay: 7-10 days  Clinical Impression CSW met with patient/family to introduce herself and complete initial assessment. Patient presented to [facility] following Infarction of left basal ganglia. Patient is able to make needs known. Stanley Stone reports  that upon discharge he will go home with his daughter ad her 3 children. She works from home and she will provide transportation home as well. Stanley Stone reports that he does not plan pick up drinking or smoking again after discharge since its been about 2 weeks since he's had either.   There were no further needs or concerns at present. CSW will follow up with family and continue to follow. Will provide patient/family with an update as soon as one becomes available.   Di'Asia  Loreli 10/31/2024, 2:29 PM

## 2024-10-31 NOTE — Evaluation (Signed)
 Physical Therapy Assessment and Plan  Patient Details  Name: Rudell Ortman MRN: [id number] Date of Birth: 1966/10/11  PT Diagnosis: Ataxic gait, Difficulty walking, Edema, Hemiplegia non-dominant, Impaired cognition, Impaired sensation, Muscle weakness, and Pain in neck Rehab Potential: Good ELOS: 10-12 days   Today's Date: 10/31/2024 PT Individual Time: 9186-9076 PT Individual Time Calculation (min): 70 min    Hospital Problem: Principal Problem:   Infarction of left basal ganglia [facility])   Past Medical History:  Past Medical History:  Diagnosis Date   AKI (acute kidney injury) 07/20/2022   Closed fracture dislocation of lumbar spine (HCC) 1987   COPD (chronic obstructive pulmonary disease) (HCC)    COVID-19 virus infection 07/20/2022   CVA (cerebral vascular accident) (HCC) 03/25/2022   Diabetes mellitus without complication (HCC)    GERD (gastroesophageal reflux disease)    High cholesterol    Homelessness 02/07/2019   Hypertension    Past Surgical History: History reviewed. No pertinent surgical history.  Assessment & Plan Clinical Impression: Patient is a 58 year old right-handed male with history significant for COPD/tobacco use, alcohol and polysubstance use , diabetes mellitus, hypertension, hyperlipidemia, GERD, medical noncompliance, depression as well as CVA with residual left-sided weakness and legally blind.  Per chart review patient has been staying in a motel living alone.  Was using a rollator.  He uses the bus for transportation.  He goes to the shelter to eat.  Independent in self-care.  He does have a daughter in the area who he plans to stay with on discharge.  Presented 10/24/2024 with reports of increased left-sided weakness worsening vision and slurred speech as well as increasing headache that got progressively worse over 2 days.  Noted blood pressure 148/91.  CT/MRI/CTA showed no acute intracranial abnormality.  Remote left basal ganglia, left frontal and  left PCA territory infarct.  CTA showed similar left M1 MCA occlusion with more distal reconstitution.  2.1 cm suspected right parotid mass with interval increase in abnormality heterogeneous, rounded and 1.1 cm right level 2 node.  Admission chemistries urine drug screen negative, sodium 134, chloride 97, glucose 467, alcohol 66, hemoglobin A1c 9.6.  Patient did not receive TNK.  Echocardiogram ejection fraction of 55 to 60% no wall motion abnormalities grade 1 diastolic dysfunction.  Follow-up ENT Dr. Norleen Notice for parotid mass and plan workup as outpatient with consideration for fine-needle aspiration.  Neurology follow-up currently maintained on aspirin  325 mg daily.  Lovenox  added for DVT prophylaxis.  CVA felt likely recrudescence of old stroke symptoms due to headache and hypertensive emergency.  Tolerating a regular diet.  Therapy evaluations completed due to patient decreased functional mobility was admitted for a comprehensive rehab program. Complains of blurred vision  Patient transferred to CIR on 10/30/2024 .   Patient currently requires mod with mobility secondary to muscle weakness and muscle joint tightness, decreased cardiorespiratoy endurance, unbalanced muscle activation, ataxia, and decreased coordination, decreased visual acuity, decreased attention to left, decreased memory, and decreased sitting balance, decreased standing balance, decreased postural control, hemiplegia, and decreased balance strategies.  Prior to hospitalization, patient was modified independent  with mobility and lived with Alone in a Other(Comment) (motel) home.  Home access is  Level entry.  Patient will benefit from skilled PT intervention to maximize safe functional mobility, minimize fall risk, and decrease caregiver burden for planned discharge home with 24 hour supervision.  Anticipate patient will benefit from follow up HH at discharge.  PT - End of Session Activity Tolerance: Tolerates 30+ min activity with  multiple rests Endurance Deficit: Yes PT Assessment Rehab Potential (ACUTE/IP ONLY): Good PT Barriers to Discharge: None PT Patient demonstrates impairments in the following area(s): Balance;Edema;Endurance;Motor;Pain;Perception;Safety;Sensory;Skin Integrity PT Transfers Functional Problem(s): Bed to Chair;Bed Mobility;Furniture;Car PT Locomotion Functional Problem(s): Wheelchair Mobility;Ambulation;Stairs PT Plan PT Intensity: Minimum of 1-2 x/day ,45 to 90 minutes PT Frequency: 5 out of 7 days PT Duration Estimated Length of Stay: 10-12 days PT Treatment/Interventions: Ambulation/gait training;Balance/vestibular training;Cognitive remediation/compensation;Community reintegration;Disease management/prevention;Discharge planning;DME/adaptive equipment instruction;Functional electrical stimulation;Functional mobility training;Neuromuscular re-education;Pain management;Patient/family education;Psychosocial support;Skin care/wound management;Splinting/orthotics;Stair training;Therapeutic Activities;Therapeutic Exercise;UE/LE Strength taining/ROM;UE/LE Coordination activities;Visual/perceptual remediation/compensation;Wheelchair propulsion/positioning PT Transfers Anticipated Outcome(s): supervision PT Locomotion Anticipated Outcome(s): supervision gait and stairs PT Recommendation Recommendations for Other Services: Neuropsych consult Follow Up Recommendations: Home health PT;24 hour supervision/assistance Patient destination: Home (daughters house) Equipment Recommended: To be determined Equipment Details: pt has rollator   PT Evaluation Precautions/Restrictions Precautions Precautions: Fall Precaution/Restrictions Comments: legally blind, L hemi, ataxic Restrictions Weight Bearing Restrictions Per Provider Order: No  Pain Reporting pain in R side of neck - noted to be swollen. Pt reports having pain medication. MD also assessed during session.  Pain Interference Pain  Interference Pain Effect on Sleep: 1. Rarely or not at all Pain Interference with Therapy Activities: 1. Rarely or not at all Pain Interference with Day-to-Day Activities: 1. Rarely or not at all Home Living/Prior Functioning Home Living Available Help at Discharge: Family Type of Home: Other(Comment) (motel) Home Access: Level entry Home Layout: One level Additional Comments: plans to d/c to daughers home which is 2 story (he will stay on first floor with full bathroom) and 1 STE  Lives With: Alone Prior Function Level of Independence: Independent with basic ADLs;Independent with transfers;Independent with gait;Requires assistive device for independence Driving: No Vocation: On disability Vision/Perception  Vision - History Baseline Vision: Legally blind Perception Perception: Impaired Preception Impairment Details: Inattention/Neglect Perception-Other Comments: tends to maintain R rotation in neck Praxis Praxis: [facility]  Cognition Orientation Level: Oriented X4 Year: 2025 Month: December Day of Week: Correct Memory: Impaired Awareness: Appears intact Safety/Judgment: Appears intact Sensation Sensation Light Touch: Impaired Detail Light Touch Impaired Details: Impaired LLE Proprioception: Impaired Detail Proprioception Impaired Details: Impaired LLE Coordination Gross Motor Movements are Fluid and Coordinated: No Fine Motor Movements are Fluid and Coordinated: No Motor  Motor Motor: Abnormal tone;Ataxia;Abnormal postural alignment and control;Hemiplegia Motor - Skilled Clinical Observations: L hemi, ataxia noted with standing   Trunk/Postural Assessment  Cervical Assessment Cervical Assessment: Exceptions to [facility] (pain with rotation (on R parotid area)) Thoracic Assessment Thoracic Assessment: Exceptions to [facility] (mildly flexed posture) Lumbar Assessment Lumbar Assessment: Exceptions to [facility] (posterior tilt) Postural Control Postural Control: Deficits on evaluation  (truncal ataxia, decreased balance strategies)  Balance Balance Balance Assessed: Yes Standardized Balance Assessment Standardized Balance Assessment: Timed Up and Go Test Timed Up and Go Test TUG: Normal TUG Normal TUG (seconds): 68 (with RW) Static Sitting Balance Static Sitting - Level of Assistance: 5: Stand by assistance Dynamic Sitting Balance Dynamic Sitting - Level of Assistance: 5: Stand by assistance Static Standing Balance Static Standing - Level of Assistance: 4: Min assist Dynamic Standing Balance Dynamic Standing - Level of Assistance: 3: Mod assist  Five times Sit to Stand Test (FTSS) Method: Use a straight back chair with a solid seat that is 16-18 high. Ask participant to sit on the chair with arms folded across their chest.   Instructions: Stand up and sit down as quickly as possible 5 times, keeping your arms folded across your chest.   Measurement: Stop  timing when the participant stands the 5th time.  TIME: ___43___ (in seconds) with UE support for sit > stands  Times > 13.6 seconds is associated with increased disability and morbidity (Guralnik, 2000) Times > 15 seconds is predictive of recurrent falls in healthy individuals aged 54 and older (Buatois, et al., 2008) Normal performance values in community dwelling individuals aged 33 and older (Bohannon, 2006): 60-69 years: 11.4 seconds 70-79 years: 12.6 seconds 80-89 years: 14.8 seconds  MCID: >= 2.3 seconds for Vestibular Disorders (Meretta, 2006)  Extremity Assessment      RLE Assessment RLE Assessment: Exceptions to [facility] General Strength Comments: grossly 3+/5 , not fluid and coordinated LLE Assessment LLE Assessment: Exceptions to [facility] Passive Range of Motion (PROM) Comments: decreased full knee extension in seated position General Strength Comments: grossly 3-/5, decreased coordination/ataxia noted  Care Tool Care Tool Bed Mobility Roll left and right activity   Roll left and right assist  level: Supervision/Verbal cueing    Sit to lying activity   Sit to lying assist level: Contact Guard/Touching assist    Lying to sitting on side of bed activity   Lying to sitting on side of bed assist level: the ability to move from lying on the back to sitting on the side of the bed with no back support.: Contact Guard/Touching assist     Care Tool Transfers Sit to stand transfer   Sit to stand assist level: Minimal Assistance - Patient > 75%    Chair/bed transfer   Chair/bed transfer assist level: Moderate Assistance - Patient 50 - 74%    Car transfer   Car transfer assist level: Minimal Assistance - Patient > 75%      Care Tool Locomotion Ambulation   Assist level: Moderate Assistance - Patient 50 - 74% Assistive device: Walker-rolling Max distance: 45'  Walk 10 feet activity   Assist level: Moderate Assistance - Patient - 50 - 74% Assistive device: Walker-rolling   Walk 50 feet with 2 turns activity Walk 50 feet with 2 turns activity did not occur: Safety/medical concerns      Walk 150 feet activity Walk 150 feet activity did not occur: Safety/medical concerns      Walk 10 feet on uneven surfaces activity Walk 10 feet on uneven surfaces activity did not occur: Safety/medical concerns      Stairs   Assist level: Minimal Assistance - Patient > 75% Stairs assistive device: 2 hand rails Max number of stairs: 4  Walk up/down 1 step activity   Walk up/down 1 step (curb) assist level: Minimal Assistance - Patient > 75% Walk up/down 1 step or curb assistive device: 2 hand rails  Walk up/down 4 steps activity   Walk up/down 4 steps assist level: Minimal Assistance - Patient > 75% Walk up/down 4 steps assistive device: 2 hand rails  Walk up/down 12 steps activity Walk up/down 12 steps activity did not occur: Safety/medical concerns      Pick up small objects from floor   Pick up small object from the floor assist level: Maximal Assistance - Patient 25 - 49%     Wheelchair Is the patient using a wheelchair?: Yes Type of Wheelchair: Manual     Max wheelchair distance: 10'  Wheel 50 feet with 2 turns activity   Assist Level: Total Assistance - Patient < 25%  Wheel 150 feet activity   Assist Level: Total Assistance - Patient < 25%    Refer to Care Plan for Long Term Goals  SHORT TERM  GOAL WEEK 1 PT Short Term Goal 1 (Week 1): Pt will perform bed mobility with S PT Short Term Goal 2 (Week 1): Pt will perform transfers with CGA PT Short Term Goal 3 (Week 1): Pt will perform gait x 50' with CGA PT Short Term Goal 4 (Week 1): Pt will perform stair negotatiation with CGA  Recommendations for other services: Neuropsych  Skilled Therapeutic Intervention  Evaluation completed (see details above and below) with education on PT POC and goals and individual treatment initiated with focus on functional bed mobility, transfers with RW, simulated car transfer, gait with RW, and balance assessments. Pt comes to EOB with overall CGA and extra time for management of LLE. Appears to have mild increased tone in RLE during MMT - difficulty with coordination as well. Mod A for initial transfer to w/c with RW due to ataxia in BLE and trunk (pt described as 'jumpy) with cues for technique, hand placement and overall balance. Pt performed simulated car transfer in similar fashion but ambulatory x 10' with min to light mod A for balance and cues for technique. No buckling in BLE noted throughout. See below for further mobility details and balance assessments completed above.  Mobility Bed Mobility Bed Mobility: Rolling Right;Supine to Sit;Sit to Supine Rolling Right: Supervision/verbal cueing Supine to Sit: Contact Guard/Touching assist Sit to Supine: Contact Guard/Touching assist Transfers Transfers: Stand to Sit;Sit to Stand;Stand Pivot Transfers Sit to Stand: Minimal Assistance - Patient > 75% Stand to Sit: Minimal Assistance - Patient > 75% Stand Pivot  Transfers: Moderate Assistance - Patient 50 - 74% Stand Pivot Transfer Details: Tactile cues for weight shifting;Verbal cues for safe use of DME/AE;Verbal cues for sequencing;Manual facilitation for weight shifting Stand Pivot Transfer Details (indicate cue type and reason): using RW, ataxia noted in BLE and trunk Transfer (Assistive device): Rolling walker Locomotion  Gait Gait Distance (Feet): 45 Feet Assistive device: Rolling walker Gait Gait Pattern: Impaired Stairs / Additional Locomotion Stairs: Yes Stairs Assistance: Minimal Assistance - Patient > 75% Stair Management Technique: Two rails;Step to pattern Number of Stairs: 4 Height of Stairs: 6 Wheelchair Mobility Wheelchair Mobility: Yes Wheelchair Assistance: Total Assistance - Patient <25% Wheelchair Propulsion: Both upper extremities Wheelchair Parts Management: Needs assistance Distance: 10   Discharge Criteria: Patient will be discharged from PT if patient refuses treatment 3 consecutive times without medical reason, if treatment goals not met, if there is a change in medical status, if patient makes no progress towards goals or if patient is discharged from hospital.  The above assessment, treatment plan, treatment alternatives and goals were discussed and mutually agreed upon: by patient  Elnor Donald Sherrell Donald WENDI Elnor, PT, DPT, CBIS  10/31/2024, 10:57 AM

## 2024-10-31 NOTE — Discharge Summary (Signed)
 Physician Discharge Summary  Patient ID: Stanley Stone MRN: [id number] DOB/AGE: 1965/12/17 58 y.o.  Admit date: 10/30/2024 Discharge date: 11/09/2024  Discharge Diagnoses:  Principal Problem:   Infarction of left basal ganglia (HCC) DVT prophylaxis Mood stabilization Right parotid mass Hypertension Hyperlipidemia Diabetes mellitus GERD History of tobacco alcohol and polysubstance use Medical noncompliance Constipation  Discharged Condition: Stable  Significant Diagnostic Studies: US  SOFT TISSUE HEAD & NECK (NON-THYROID ) Result Date: 11/01/2024 EXAM: US  Right Neck Nonvascular Soft Tissue Ultrasound 10/31/2024 11:04:00 PM TECHNIQUE: Real-time ultrasound scan of the Right Neck with image documentation. COMPARISON: CTA head and neck 10/24/2024. CLINICAL HISTORY: Right neck swelling. FINDINGS: SOFT TISSUES: The palpable abnormality near the angle of the mandible on the right corresponds to a 1.3 x 1.2 x 1.3 cm mass which appears to be along the parotid tail. The mass is hypo- to anechoic with posterior acoustic enhancement and at least 1 internal septation. This likely corresponds to the smaller, heterogeneously enhancing mass at the tip of the right parotid tail on CT, while the larger right parotid region mass located more superiorly on CT was not clearly shown on this ultrasound. No fluid collection. IMPRESSION: 1. 1.3 cm mass near the tip of the right parotid tail, which could reflect a primary parotid neoplasm or pathologic lymph node. Recommend ENT referral. Electronically signed by: Dasie Hamburg MD 11/01/2024 09:07 AM EST RP Workstation: [id number]   DG Swallowing Func-Speech Pathology Result Date: 10/25/2024 Table formatting from the original result was not included. Modified Barium Swallow Study Patient Details Name: Stanley Stone MRN: [id number] Date of Birth: 1966-07-01 Today's Date: 10/25/2024 HPI/PMH: HPI: Mr. Calabretta is a 58 yo male presenting 12/11 with two days of worsening L  weakness, worsening vision, and slurred speech. No acute abnormality per radiology report, but per neurology note, there is a punctate central hyperintensity at the pontomedullary junction. 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 Previous MBS in July 2025 revealed oropharyngeal deficits and suspected esophageal component but airway protection was complete. Regular diet and thin liquids were recommended. SLP services throughout that admission otherwise focused on communication/fluency. PMH includes: stroke with residual L weakness and bilateral vision loss, DM, HTN, hypercholesterolemia, COPD, AKI, medication noncompliance, EtOH abuse, polysubstance abuse, unhoused, GERD Clinical Impression: Pt has grossly functional oropharyngeal swallowing, exhibiting aspiration only x1 on initial sip. Suspect some of the coughing during intake at home may be related to more impulsive intake than could be observed during this study. Recommend starting regular solids and thin liquids but reinforced the importance of pacing. Pt attributes aspiration event to being surprised by the taste, but also denied any sensation of aspiration occurring despite cough response (PAS 7, aspirates not cleared by cough). Throughout the remainder of testing, he had good efficiency and safety with only intermittent, transient penetration of thin liquids, which is considered to be normal (PAS 2). Note that pt had occasional, subtle coughing throughout testing that was not related to airway protection. Question if it could be esophageal, given retention and retrograde flow observed during brief scan. Attempted to get pt to take larger volumes at a faster rate given report of frequent coughing at home, but he would not do so given aversion to barium; however, he acknowledges that coughing spells at home are often when he is drinking a lot very quickly. Factors that may increase risk of adverse event in presence of aspiration  Noe & Lianne 2021): Factors that may increase risk of adverse event in presence of aspiration Noe &  Lianne 2021): Poor general health and/or compromised immunity; Weak cough Recommendations/Plan: Swallowing Evaluation Recommendations Swallowing Evaluation Recommendations Recommendations: PO diet PO Diet Recommendation: Regular; Thin liquids (Level 0) Liquid Administration via: Cup; Straw Medication Administration: Whole meds with liquid Supervision: Patient able to self-feed; Set-up assistance for safety Swallowing strategies  : Slow rate; Small bites/sips Postural changes: Position pt fully upright for meals; Stay upright 30-60 min after meals Oral care recommendations: Oral care BID (2x/day) Recommended consults: Consider ENT consultation Treatment Plan Treatment Plan Treatment recommendations: Therapy as outlined in treatment plan below Follow-up recommendations: Skilled nursing-short term rehab (<3 hours/day) Functional status assessment: Patient has had a recent decline in their functional status and demonstrates the ability to make significant improvements in function in a reasonable and predictable amount of time. Treatment frequency: Min 2x/week Treatment duration: 2 weeks Interventions: Aspiration precaution training; Compensatory techniques; Patient/family education; Diet toleration management by SLP Recommendations Recommendations for follow up therapy are one component of a multi-disciplinary discharge planning process, led by the attending physician.  Recommendations may be updated based on patient status, additional functional criteria and insurance authorization. Assessment: Orofacial Exam: Orofacial Exam Oral Cavity: Oral Hygiene: WFL Oral Cavity - Dentition: Adequate natural dentition; Missing dentition Orofacial Anatomy: WFL Anatomy: Anatomy: WFL Boluses Administered: Boluses Administered Boluses Administered: Thin liquids (Level 0); Mildly thick liquids (Level 2, nectar thick);  Moderately thick liquids (Level 3, honey thick); Puree; Solid  Oral Impairment Domain: Oral Impairment Domain Lip Closure: Interlabial escape, no progression to anterior lip Tongue control during bolus hold: Cohesive bolus between tongue to palatal seal Bolus preparation/mastication: Timely and efficient chewing and mashing Bolus transport/lingual motion: Brisk tongue motion Oral residue: Residue collection on oral structures Location of oral residue : Tongue Initiation of pharyngeal swallow : Valleculae  Pharyngeal Impairment Domain: Pharyngeal Impairment Domain Soft palate elevation: No bolus between soft palate (SP)/pharyngeal wall (PW) Laryngeal elevation: Complete superior movement of thyroid  cartilage with complete approximation of arytenoids to epiglottic petiole Anterior hyoid excursion: Complete anterior movement Epiglottic movement: Complete inversion Laryngeal vestibule closure: Complete, no air/contrast in laryngeal vestibule Pharyngeal stripping wave : Present - complete Pharyngeal contraction (A/P view only): N/A Pharyngoesophageal segment opening: Complete distension and complete duration, no obstruction of flow Tongue base retraction: No contrast between tongue base and posterior pharyngeal wall (PPW) Pharyngeal residue: Complete pharyngeal clearance Location of pharyngeal residue: N/A  Esophageal Impairment Domain: Esophageal Impairment Domain Esophageal clearance upright position: Esophageal retention with retrograde flow below pharyngoesophageal segment (PES) Pill: Pill Consistency administered: Thin liquids (Level 0) Thin liquids (Level 0): [facility] Penetration/Aspiration Scale Score: Penetration/Aspiration Scale Score 1.  Material does not enter airway: Mildly thick liquids (Level 2, nectar thick); Moderately thick liquids (Level 3, honey thick); Puree; Solid; Pill 7.  Material enters airway, passes BELOW cords and not ejected out despite cough attempt by patient: Thin liquids (Level 0) Compensatory  Strategies: No data recorded  General Information: Caregiver present: No  Diet Prior to this Study: NPO   Temperature : Normal   Respiratory Status: WFL   Supplemental O2: None (Room air)   History of Recent Intubation: No  Behavior/Cognition: Alert; Cooperative; Pleasant mood Self-Feeding Abilities: Able to self-feed Baseline vocal quality/speech: Dysphonic Volitional Cough: Able to elicit Volitional Swallow: Able to elicit Exam Limitations: No limitations Goal Planning: Prognosis for improved oropharyngeal function: Good No data recorded No data recorded Patient/Family Stated Goal: wants food Consulted and agree with results and recommendations: Patient Pain: Pain Assessment Pain Assessment: Faces Pain Score: 4 Faces Pain Scale: 0  Pain Location: head, back Pain Descriptors / Indicators: Aching Pain Intervention(s): Monitored during session; Repositioned End of Session: Start Time:SLP Start Time (ACUTE ONLY): 1514 Stop Time: SLP Stop Time (ACUTE ONLY): 1529 Time Calculation:SLP Time Calculation (min) (ACUTE ONLY): 15 min Charges: SLP Evaluations $ SLP Speech Visit: 1 Visit SLP Evaluations $BSS Swallow: 1 Procedure $MBS Swallow: 1 Procedure $ SLP EVAL LANGUAGE/SOUND PRODUCTION: 1 Procedure SLP visit diagnosis: SLP Visit Diagnosis: Dysphagia, unspecified (R13.10) Past Medical History: Past Medical History: Diagnosis Date  AKI (acute kidney injury) 07/20/2022  Closed fracture dislocation of lumbar spine (HCC) 1987  COPD (chronic obstructive pulmonary disease) (HCC)   COVID-19 virus infection 07/20/2022  CVA (cerebral vascular accident) (HCC) 03/25/2022  Diabetes mellitus without complication (HCC)   GERD (gastroesophageal reflux disease)   High cholesterol   Homelessness 02/07/2019  Hypertension  Past Surgical History: No past surgical history on file. Leita SAILOR., M.A. CCC-SLP Acute Rehabilitation Services Office: [phone] Secure chat preferred 10/25/2024, 3:57 PM  ECHOCARDIOGRAM COMPLETE Result Date:  10/25/2024    ECHOCARDIOGRAM REPORT   Patient Name:   Stanley Stone Date of Exam: 10/25/2024 Medical Rec #:  [id number]        Height:       65.0 in Accession #:    [id number]       Weight:       180.8 lb Date of Birth:  Dec 22, 1965        BSA:          1.895 m Patient Age:    58 years         BP:           152/87 mmHg Patient Gender: M                HR:           74 bpm. Exam Location:  Inpatient Procedure: 2D Echo, Cardiac Doppler, Color Doppler and Intracardiac            Opacification Agent (Both Spectral and Color Flow Doppler were            utilized during procedure). Indications:    Stroke I63.9  History:        Patient has prior history of Echocardiogram examinations, most                 recent 03/27/2022.  Sonographer:    Tinnie Gosling RDCS Referring Phys: [id number] ROBERT DORRELL IMPRESSIONS  1. Left ventricular ejection fraction, by estimation, is 55 to 60%. Left ventricular ejection fraction by 2D MOD biplane is 60.1 %. The left ventricle has normal function. The left ventricle has no regional wall motion abnormalities. There is moderate concentric left ventricular hypertrophy. Left ventricular diastolic parameters are consistent with Grade I diastolic dysfunction (impaired relaxation).  2. Right ventricular systolic function is normal. The right ventricular size is normal. Tricuspid regurgitation signal is inadequate for assessing PA pressure.  3. Left atrial size was mildly dilated.  4. The mitral valve is normal in structure. No evidence of mitral valve regurgitation. No evidence of mitral stenosis.  5. The aortic valve is tricuspid. Aortic valve regurgitation is mild. No aortic stenosis is present.  6. Aortic dilatation noted. There is moderate dilatation of the ascending aorta, measuring 44 mm.  7. The inferior vena cava is normal in size with greater than 50% respiratory variability, suggesting right atrial pressure of 3 mmHg. FINDINGS  Left Ventricle: Left ventricular ejection fraction, by  estimation, is 55 to 60%. Left ventricular  ejection fraction by 2D MOD biplane is 60.1 %. The left ventricle has normal function. The left ventricle has no regional wall motion abnormalities. Definity  contrast agent was given IV to delineate the left ventricular endocardial borders. The left ventricular internal cavity size was normal in size. There is moderate concentric left ventricular hypertrophy. Left ventricular diastolic parameters are  consistent with Grade I diastolic dysfunction (impaired relaxation). Right Ventricle: The right ventricular size is normal. No increase in right ventricular wall thickness. Right ventricular systolic function is normal. Tricuspid regurgitation signal is inadequate for assessing PA pressure. Left Atrium: Left atrial size was mildly dilated. Right Atrium: Right atrial size was normal in size. Pericardium: There is no evidence of pericardial effusion. Mitral Valve: The mitral valve is normal in structure. No evidence of mitral valve regurgitation. No evidence of mitral valve stenosis. Tricuspid Valve: The tricuspid valve is normal in structure. Tricuspid valve regurgitation is not demonstrated. Aortic Valve: The aortic valve is tricuspid. Aortic valve regurgitation is mild. No aortic stenosis is present. Pulmonic Valve: The pulmonic valve was normal in structure. Pulmonic valve regurgitation is not visualized. Aorta: Aortic dilatation noted. There is moderate dilatation of the ascending aorta, measuring 44 mm. Venous: The inferior vena cava is normal in size with greater than 50% respiratory variability, suggesting right atrial pressure of 3 mmHg. IAS/Shunts: No atrial level shunt detected by color flow Doppler.  LEFT VENTRICLE PLAX 2D                        Biplane EF (MOD) LVIDd:         4.30 cm         LV Biplane EF:   Left LVIDs:         2.90 cm                          ventricular LV PW:         1.40 cm                          ejection LV IVS:        1.40 cm                           fraction by LVOT diam:     2.30 cm                          2D MOD LV SV:         75                               biplane is LV SV Index:   39                               60.1 %. LVOT Area:     4.15 cm LV IVRT:       143 msec        Diastology                                LV e' medial:    4.90 cm/s  LV E/e' medial:  11.4 LV Volumes (MOD)               LV e' lateral:   8.49 cm/s LV vol d, MOD    103.0 ml      LV E/e' lateral: 6.6 A2C: LV vol d, MOD    128.0 ml A4C: LV vol s, MOD    45.3 ml A2C: LV vol s, MOD    43.5 ml A4C: LV SV MOD A2C:   57.7 ml LV SV MOD A4C:   128.0 ml LV SV MOD BP:    70.1 ml RIGHT VENTRICLE             IVC RV S prime:     12.50 cm/s  IVC diam: 2.00 cm TAPSE (M-mode): 2.4 cm                             PULMONARY VEINS                             Diastolic Velocity: 36.30 cm/s                             S/D Velocity:       1.10                             Systolic Velocity:  41.30 cm/s LEFT ATRIUM             Index        RIGHT ATRIUM           Index LA diam:        3.50 cm 1.85 cm/m   RA Area:     13.90 cm LA Vol (A2C):   71.6 ml 37.78 ml/m  RA Volume:   31.80 ml  16.78 ml/m LA Vol (A4C):   54.3 ml 28.65 ml/m LA Biplane Vol: 66.2 ml 34.93 ml/m  AORTIC VALVE LVOT Vmax:   81.40 cm/s LVOT Vmean:  62.500 cm/s LVOT VTI:    0.180 m  AORTA Ao Root diam: 4.40 cm Ao Asc diam:  3.70 cm MITRAL VALVE MV Area (PHT): 3.45 cm    SHUNTS MV Decel Time: 220 msec    Systemic VTI:  0.18 m MV E velocity: 56.10 cm/s  Systemic Diam: 2.30 cm MV A velocity: 71.30 cm/s MV E/A ratio:  0.79 Dalton McleanMD Electronically signed by Ezra Kanner Signature Date/Time: 10/25/2024/3:34:26 PM    Final    MR BRAIN WO CONTRAST Result Date: 10/25/2024 EXAM: MRI BRAIN WITHOUT CONTRAST 10/25/2024 01:00:00 AM TECHNIQUE: Multiplanar multisequence MRI of the head/brain was performed without the administration of intravenous contrast. COMPARISON: MRI head 09/22/2022. CLINICAL  HISTORY: Neuro deficit, acute, stroke suspected FINDINGS: BRAIN AND VENTRICLES: No acute infarct. Remote left PCA territory infarct. Remote left basal ganglia and left frontal infarcts. No intracranial hemorrhage. No mass. No midline shift. No hydrocephalus. The sella is unremarkable. Diminuitive left MCA flow void compatible with known left MCA occlusion seen on same day CTA. ORBITS: No acute abnormality. SINUSES AND MASTOIDS: Left maxillary sinus mucosal thickening. BONES AND SOFT TISSUES: Normal marrow signal. Right parotid mass bettter charcterized on same day CTA head/neck. IMPRESSION: 1. No acute intracranial abnormality. 2. Remote left basal ganglia, left frontal and left PCA territory infarcts. 3. Right parotid mass  bettter charcterized on same day CTA head/neck. Electronically signed by: Gilmore Molt 10/25/2024 01:34 AM EST RP Workstation: [id number]   CT Head Wo Contrast Result Date: 10/24/2024 EXAM: CT HEAD WITHOUT 10/24/2024 10:16:29 PM TECHNIQUE: CT of the head was performed without the administration of intravenous contrast. Automated exposure control, iterative reconstruction, and/or weight based adjustment of the mA/kV was utilized to reduce the radiation dose to as low as reasonably achievable. COMPARISON: CT code stroke May 25, 2024 CLINICAL HISTORY: Headache, new onset (Age >= 51y) FINDINGS: BRAIN AND VENTRICLES: No acute intracranial hemorrhage. Similar remote left PCA territory, left basal ganglia, and left frontal cortical infarcts. No mass effect or midline shift. No extra-axial fluid collection. No evidence of acute infarct. No hydrocephalus. ORBITS: No acute abnormality. SINUSES AND MASTOIDS: Left maxillary sinus mucosal thickening. No mastoid effusions. SOFT TISSUES AND SKULL: No acute skull fracture. No acute soft tissue abnormality. IMPRESSION: 1. No acute intracranial abnormality. ASPECTS 10. 2. Similar remote left PCA territory, left basal ganglia, and left frontal cortical  infarcts. Electronically signed by: Gilmore Molt 10/24/2024 10:58 PM EST RP Workstation: [id number]   CT ANGIO HEAD NECK W WO CM Result Date: 10/24/2024 EXAM: CTA Head and Neck with Intravenous Contrast. CT Head without Contrast. CLINICAL HISTORY: Neuro deficit, acute, stroke suspected TECHNIQUE: Axial CTA images of the head and neck performed with intravenous contrast. MIP reconstructed images were created and reviewed. Axial computed tomography images of the head/brain performed without intravenous contrast. Note: Per PQRS, the description of internal carotid artery percent stenosis, including 0 percent or normal exam, is based on [employer] Symptomatic Carotid Endarterectomy Trial (NASCET) criteria. Dose reduction technique was used including one or more of the following: automated exposure control, adjustment of mA and kV according to patient size, and/or iterative reconstruction. CONTRAST: 75 mL Omnipaque  COMPARISON: CTA head/neck May 25, 2024 FINDINGS: CT HEAD: BRAIN: Similar appearance of remote left PCA territory, left basal ganglia, and left frontal cortical infarcts. No acute intraparenchymal hemorrhage. No mass lesion. No CT evidence for acute territorial infarct. No midline shift or extra-axial collection. VENTRICLES: No hydrocephalus. ORBITS: The orbits are unremarkable. SINUSES AND MASTOIDS: Left maxillary sinus and scattered ethmoid air cells mucosal thickening. No mastoid effusion CTA NECK: COMMON CAROTID ARTERIES: No significant stenosis. No dissection or occlusion. INTERNAL CAROTID ARTERIES: No stenosis by NASCET criteria. No dissection or occlusion. 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 No dissection or occlusion. CTA HEAD: ANTERIOR CEREBRAL ARTERIES: No significant stenosis. No occlusion. No aneurysm. MIDDLE CEREBRAL ARTERIES: Similar left M1 MCA occlusion with more distal reconstitution. No significant stenosis of the right MCA. No aneurysm.  POSTERIOR CEREBRAL ARTERIES: No significant stenosis. No occlusion. No aneurysm. BASILAR ARTERY: No significant stenosis. No occlusion. No aneurysm. OTHER: SOFT TISSUES: Similar 2.1 cm mass that appears to be arising from the right parotid mass. Increase in abnormally heterogeneous, rounded and now 1.1 cm right level II node on series 10, image 104. BONES: No acute osseous abnormality. IMPRESSION: 1. No evidence of acute intracranial abnormality. ASPECTS 10. 2. Similar left M1 MCA occlusion with more distal reconstitution. 3. 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 4. Similar 2.1 cm suspected right parotid mass with interval increase in abnormally heterogeneous, rounded and now 1.1 cm right level II node. 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 Recommend ENT consultation if not already made. Electronically signed by: Gilmore Molt 10/24/2024 10:55 PM EST RP Workstation: [id number]    Labs:  Basic Metabolic Panel: Recent Labs  Lab 11/06/24 0431  CREATININE 1.19  MG 2.5*    CBC: No results for input(s): WBC, NEUTROABS, HGB, HCT, MCV, PLT in the last 168 hours.   CBG: Recent Labs  Lab 11/06/24 2118 11/07/24 0628 11/07/24 1144 11/07/24 1613 11/07/24 2014  GLUCAP 155* 112* 128* 236* 221*    Brief HPI:   Yonah Tangeman is a 58 y.o. right-handed male with history significant for COPD/tobacco use, alcohol and polysubstance use, diabetes mellitus, hypertension, hyperlipidemia, GERD, medical noncompliance, depression as well as CVA with residual left-sided weakness and legally blind.  Per chart review patient has been staying in a motel living alone.  Was using a rolling walker.  He uses the bus for transportation.  He does go to the shelter to eat.  Independent self-care.  He does have a daughter in the area who he plans to stay with on discharge.  Presented 10/24/2024 with reports of increased left-sided  weakness worsening vision and slurred speech as well as increasing headache that got progressively worse over 2 days.  Noted blood pressure 148/91.  CT/MRI/CTA showed no acute intracranial abnormality.  Remote left basal ganglia, left frontal and left PCA territory infarction.  CTA showed similar left M1 MCA occlusion with more distal reconstitution.  2.1 cm suspect right parotid mass with interval increase in abnormality heterogeneous rounded 1.1 cm right level 2 node.  Admission chemistries urine drug screen negative sodium 134 chloride 97 glucose 467 alcohol 66 hemoglobin A1c 9.6.  Patient did not receive TNK.  Echocardiogram with ejection fraction of 55 to 60% no wall motion abnormalities grade 1 diastolic dysfunction.  Follow-up ENT Dr. Norleen Notice for parotid mass and plan workup as outpatient consideration for fine-needle aspiration.  Neurology follow-up maintained on aspirin  325 mg daily for CVA prophylaxis.  Lovenox  added for DVT prophylaxis.  CVA felt likely rectuderence of old stroke symptoms due to headache and hypertensive emergency.  Therapy evaluations completed due to patient decreased functional mobility was admitted for a comprehensive rehab program.   Hospital Course: Jonovan Boedecker was admitted to rehab 10/30/2024 for inpatient therapies to consist of PT, ST and OT at least three hours five days a week. Past admission physiatrist, therapy team and rehab RN have worked together to provide customized collaborative inpatient rehab.  Pertaining to patient's remote left basal ganglia left frontal left PCA territory infarct due to headache hypertensive emergency remained stable follow-up neurology services continue full-strength aspirin .  Lovenox  for DVT prophylaxis.  Hospital course incidental findings of right parotid mass follow-up outpatient Dr. Notice for needle aspiration.  Patient did have some complaints of right side neck pain with ultrasound completed 10/31/2024 showing a 1.3 cm mass near  the tip of the right parotid tail and ENT was made aware of findings with recommendations again made for outpatient follow-up with interventional radiology for fine-needle aspiration.  Patient initially recommended by ENT for Augmentin twice daily in case of possible superimposed infection as well as prednisone  20 mg x 7 days patient noted history of anaphylactic allergy to penicillins, meropenem  ordered after pharmacy consultation and await ENT follow-up on recommendations of duration of antibiotic as well as steroid.  Patient had been cleared for Lovenox  for DVT prophylaxis no bleeding episodes.  Mood stabilization with the use of Zoloft  he was attending full therapies.  Blood pressure monitored somewhat soft remained on Coreg  would need outpatient follow-up.  Lipitor  ongoing for hyperlipidemia.  Blood sugars had some initial elevations hemoglobin A1c 9.6 insulin  therapy  as directed with diabetic teaching patient had been on weekly Trulicity  with Lantus  insulin  as well as Glucophage  with Farxiga  prior to admission however he did agree to being noncompliant with these medications.  His Lantus  insulin  was adjusted due to recent need for steroid for parotid mass.  History of tobacco alcohol polysubstance abuse urine drug screen negative alcohol level 66 he remained on a NicoDerm patch and received counts regards cessation of illicit drug use smoking and alcohol.  History of medical noncompliance again receiving extended counseling regards to maintaining medical regimen.   Blood pressures were monitored on TID basis and remained controlled and monitored  Diabetes has been monitored with ac/hs CBG checks and SSI was use prn for tighter BS control.    Rehab course: During patient's stay in rehab weekly team conferences were held to monitor patient's progress, set goals and discuss barriers to discharge. At admission, patient required minimal assist 100 feet rolling walker min mod assist step pivot  transfers  He/She  has had improvement in activity tolerance, balance, postural control as well as ability to compensate for deficits. He/She has had improvement in functional use RUE/LUE  and RLE/LLE as well as improvement in awareness.  Working with energy conservation techniques.  Minimal assist sit to stand as well as min to mod assist chair to bed transfers minimal assist car transfers.  Sit to side-lying contact-guard.  Ambulates with a rolling walker 45 feet minimal assist.  Minimal assist for ADLs as well as bathing of upper extremities and mod assist lower body.  Min mod assist for toileting.  He did have some difficulty at times expressing needs and ideas.  Full family teaching completed plan discharged home.       Disposition:  Discharge disposition: 01-Home or Self Care        Diet: Diabetic diet  Special Instructions: No driving smoking or alcohol  Medications at discharge. 1.  Tylenol  as needed 2.  Lantus  insulin  45 units daily 3.  Aspirin  325 mg p.o. daily 4.  Lipitor  80 mg p.o. daily 5.  Hydrocodone  1 tablet every 12 hours as needed pain 6.  Coreg  25 mg p.o. twice daily 7.  Vitamin D  1000 units p.o. daily 8.  Farxiga  10 mg daily 9.  Magnesium  oxide 200 mg p.o. daily 10.  NicoDerm patch taper as directed 11.  Protonix  40 mg p.o. daily 12.  Zoloft  50 mg daily 13.  Albuterol  inhaler 2 puffs every 6 hours as needed 14.  Vitamin B12 1000 mcg daily 15.  Advair  diskus 1 puff twice daily 16.  Glucophage  1000 mg daily   30-35 minutes were spent completing discharge summary and discharge planning  Discharge Instructions     Ambulatory referral to Neurology   Complete by: As directed    An appointment is requested in approximately: 4 weeks left basal ganglia infarction   Ambulatory referral to Physical Medicine Rehab   Complete by: As directed    Moderate complexity follow-up 1 month left basal ganglia infarction        Follow-up Information     Raulkar,  Sven SQUIBB, MD Follow up.   Specialty: Physical Medicine and Rehabilitation Why: Office to call for appointment Contact information: [address] [place] [address] [phone]         [facility] Follow up.   Specialty: Pediatrics Why: Call for appointment Contact information: [address] [place] [address] [phone]  Roark Rush, MD Follow up.   Specialty: Otolaryngology Why: Evaluation of parotid mass Contact information: [address] [place] [address] [phone]         Johann Sieving, MD Follow up.   Specialties: Interventional Radiology, Radiology Why: To address fine-needle aspiration for parotid mass Contact information: [address] [place] [address] [phone]                 Signed: Sieving PARAS Gracin Mcpartland 11/08/2024, 4:22 AM

## 2024-10-31 NOTE — Progress Notes (Signed)
 Inpatient Rehabilitation Center Individual Statement of Services  Patient Name:  Stanley Stone  Date:  10/31/2024  Welcome to the Inpatient Rehabilitation Center.  Our goal is to provide you with an individualized program based on your diagnosis and situation, designed to meet your specific needs.  With this comprehensive rehabilitation program, you will be expected to participate in at least 3 hours of rehabilitation therapies Monday-Friday, with modified therapy programming on the weekends.  Your rehabilitation program will include the following services:  Physical Therapy (PT), Occupational Therapy (OT), Speech Therapy (ST), 24 hour per day rehabilitation nursing, Therapeutic Recreaction (TR), Neuropsychology, Care Coordinator, Rehabilitation Medicine, Nutrition Services, and Pharmacy Services  Weekly team conferences will be held on Wednesday to discuss your progress.  Your Inpatient Rehabilitation Care Coordinator will talk with you frequently to get your input and to update you on team discussions.  Team conferences with you and your family in attendance may also be held.  Expected length of stay: 7-10 days   Overall anticipated outcome: Independent with assistive device    Depending on your progress and recovery, your program may change. Your Inpatient Rehabilitation Care Coordinator will coordinate services and will keep you informed of any changes. Your Inpatient Rehabilitation Care Coordinator's name and contact numbers are listed  below.  The following services may also be recommended but are not provided by the Inpatient Rehabilitation Center:  Driving Evaluations Home Health Rehabiltiation Services Outpatient Rehabilitation Services Vocational Rehabilitation   Arrangements will be made to provide these services after discharge if needed.  Arrangements include referral to agencies that provide these services.  Your insurance has been verified to be:  MEDICAID [name] / MEDICAID [place]  [name]  Your primary doctor is:  [facility]   Pertinent information will be shared with your doctor and your insurance company.  Inpatient Rehabilitation Care Coordinator:  Di'Asia Loreli SIERRAS [phone] or ELIGAH BRINKS  Information discussed with and copy given to patient by: [name], 10/31/2024, 2:27 PM

## 2024-10-31 NOTE — Progress Notes (Signed)
 Patient ID: Stanley Stone, male   DOB: 1966-04-28, 58 y.o.   MRN: [id number]  Statement of service delivered.

## 2024-10-31 NOTE — Progress Notes (Signed)
 Inpatient Rehabilitation  Patient information reviewed and entered into eRehab system by [facility]. Karen Kays., CCC/SLP, PPS Coordinator.  Information including medical coding, functional ability and quality indicators will be reviewed and updated through discharge.

## 2024-10-31 NOTE — Progress Notes (Signed)
°   10/31/24 1000  Spiritual Encounters  Type of Visit Initial  Care provided to: Patient  Reason for visit Advance directives  OnCall Visit No   Chaplain provided AD document and AD education to Patient. Chaplain reviewed AD document with the Patient and informed Patient that once AD document has been completed, Patient should reach out to Chaplain office via Nurse to have the document notarized. Chaplain remains available upon request.  Chaplain Lincon Sahlin

## 2024-10-31 NOTE — Progress Notes (Signed)
 PROGRESS NOTE   Subjective/Complaints: C/o swelling on right side of neck, US  ordered Labs are stable Patient's chart reviewed- No issues reported overnight Vitals signs stable   ROS:+right sided neck swelling Objective:   No results found. Recent Labs    10/30/24 2023 10/31/24 0533  WBC 9.5 9.0  HGB 14.5 14.6  HCT 43.2 42.7  PLT 209 208   Recent Labs    10/30/24 0227 10/30/24 2023 10/31/24 0533  NA 137  --  140  K 4.1  --  4.0  CL 100  --  102  CO2 25  --  29  GLUCOSE 155*  --  105*  BUN 25*  --  23*  CREATININE 1.37* 1.26* 1.26*  CALCIUM  9.4  --  9.1    Intake/Output Summary (Last 24 hours) at 10/31/2024 1222 Last data filed at 10/31/2024 9278 Gross per 24 hour  Intake 696 ml  Output 2050 ml  Net -1354 ml        Physical Exam: Vital Signs Blood pressure 122/62, pulse 68, temperature 97.7 F (36.5 C), resp. rate 18, height 5' 5 (1.651 m), weight 76.3 kg, SpO2 96%. Gen: no distress, normal appearing HEENT: oral mucosa pink and moist, NCAT Cardio: Reg rate Chest: normal effort, normal rate of breathing Abd: soft, non-distended Ext: no edema Psych: pleasant, normal affect Skin: intact Neuro: Aox3, LUE 2/5, LLE 3/5, sensation decreased left side, impaired FTN left side, legally blind   Assessment/Plan: 1. Functional deficits which require 3+ hours per day of interdisciplinary therapy in a comprehensive inpatient rehab setting. Physiatrist is providing close team supervision and 24 hour management of active medical problems listed below. Physiatrist and rehab team continue to assess barriers to discharge/monitor patient progress toward functional and medical goals  Care Tool:  Bathing    Body parts bathed by patient: Left arm, Right arm, Chest, Abdomen, Front perineal area, Right upper leg, Left upper leg, Face   Body parts bathed by helper: Right arm, Buttocks, Left lower leg, Right lower  leg     Bathing assist Assist Level: Moderate Assistance - Patient 50 - 74%     Upper Body Dressing/Undressing Upper body dressing   What is the patient wearing?: Hospital gown only    Upper body assist Assist Level: Minimal Assistance - Patient > 75%    Lower Body Dressing/Undressing Lower body dressing      What is the patient wearing?: Pants, Underwear/pull up     Lower body assist Assist for lower body dressing: Moderate Assistance - Patient 50 - 74%     Toileting Toileting    Toileting assist Assist for toileting: Moderate Assistance - Patient 50 - 74%     Transfers Chair/bed transfer  Transfers assist     Chair/bed transfer assist level: Moderate Assistance - Patient 50 - 74%     Locomotion Ambulation   Ambulation assist      Assist level: Moderate Assistance - Patient 50 - 74% Assistive device: Walker-rolling Max distance: 45'   Walk 10 feet activity   Assist     Assist level: Moderate Assistance - Patient - 50 - 74% Assistive device: Walker-rolling   Walk 50 feet  activity   Assist Walk 50 feet with 2 turns activity did not occur: Safety/medical concerns         Walk 150 feet activity   Assist Walk 150 feet activity did not occur: Safety/medical concerns         Walk 10 feet on uneven surface  activity   Assist Walk 10 feet on uneven surfaces activity did not occur: Safety/medical concerns         Wheelchair     Assist Is the patient using a wheelchair?: Yes Type of Wheelchair: Manual      Max wheelchair distance: 10'    Wheelchair 50 feet with 2 turns activity    Assist        Assist Level: Total Assistance - Patient < 25%   Wheelchair 150 feet activity     Assist      Assist Level: Total Assistance - Patient < 25%   Blood pressure 122/62, pulse 68, temperature 97.7 F (36.5 C), resp. rate 18, height 5' 5 (1.651 m), weight 76.3 kg, SpO2 96%.  Medical Problem List and Plan: 1.  Functional deficits secondary to likely recrudescence of old stroke symptoms(remote left basal ganglia, left frontal and left PCA territory infarct) due to headache and hypertensive emergency             -patient may shower             -ELOS/Goals: 5-7 days S             Admit to CIR Chart and therapy notes reviewed, continue CIR, discussed patient's progress with therapy Grounds pass ordered Vitamin D3/Metanx/Vitamin B/C complex ordered F/u with me or Fidela in clinic within 1 month Would benefit from home health aide upon discharge  Messaged team to set d/c date -continue Lovenox              -antiplatelet therapy: Aspirin  325 mg daily  This patient is capable of making decisions on his own behalf.  2. Right sided neck swelling 2/2 parotid mass: US  ordered, f/u with ENT outpatient  3. Depression: continue Zoloft  50 mg daily              4.  Hypertension: decrease Norvasc  to 2.5mg  daily. Coreg  25 mg twice daily.  Monitor with increased mobility   10.  Hyperlipidemia.  Lipitor    11.  Diabetes mellitus.  Hemoglobin A1c 9.6.  Lantus  insulin  36 units daily, NovoLog  6 units 3 times daily, magnesium  supplement started   12.  History of tobacco alcohol and polysubstance abuse.  Urine drug screen negative.  NicoDerm patch.  Provide counseling   13.  GERD: continue Protonix    14.  Medical noncompliance.  Counseling   15.  Constipation: LBM 12/17, decrease miralax  to daily  LOS: 1 days A FACE TO FACE EVALUATION WAS PERFORMED  Stanley Stone [name] 10/31/2024, 12:22 PM

## 2024-10-31 NOTE — Plan of Care (Signed)
°  Problem: RH Balance Goal: LTG Patient will maintain dynamic standing with ADLs (OT) Description: LTG:  Patient will maintain dynamic standing balance with assist during activities of daily living (OT)  Flowsheets (Taken 10/31/2024 1214) LTG: Pt will maintain dynamic standing balance during ADLs with: Supervision/Verbal cueing   Problem: Sit to Stand Goal: LTG:  Patient will perform sit to stand in prep for activites of daily living with assistance level (OT) Description: LTG:  Patient will perform sit to stand in prep for activites of daily living with assistance level (OT) Flowsheets (Taken 10/31/2024 1214) LTG: PT will perform sit to stand in prep for activites of daily living with assistance level: Independent with assistive device   Problem: RH Bathing Goal: LTG Patient will bathe all body parts with assist levels (OT) Description: LTG: Patient will bathe all body parts with assist levels (OT) Flowsheets (Taken 10/31/2024 1214) LTG: Pt will perform bathing with assistance level/cueing: Supervision/Verbal cueing   Problem: RH Dressing Goal: LTG Patient will perform upper body dressing (OT) Description: LTG Patient will perform upper body dressing with assist, with/without cues (OT). Flowsheets (Taken 10/31/2024 1214) LTG: Pt will perform upper body dressing with assistance level of: Set up assist Goal: LTG Patient will perform lower body dressing w/assist (OT) Description: LTG: Patient will perform lower body dressing with assist, with/without cues in positioning using equipment (OT) Flowsheets (Taken 10/31/2024 1214) LTG: Pt will perform lower body dressing with assistance level of: Supervision/Verbal cueing   Problem: RH Toileting Goal: LTG Patient will perform toileting task (3/3 steps) with assistance level (OT) Description: LTG: Patient will perform toileting task (3/3 steps) with assistance level (OT)  Flowsheets (Taken 10/31/2024 1214) LTG: Pt will perform toileting task  (3/3 steps) with assistance level: Supervision/Verbal cueing   Problem: RH Functional Use of Upper Extremity Goal: LTG Patient will use RT/LT upper extremity as a (OT) Description: LTG: Patient will use right/left upper extremity as a stabilizer/gross assist/diminished/nondominant/dominant level with assist, with/without cues during functional activity (OT) Flowsheets (Taken 10/31/2024 1214) LTG: Use of upper extremity in functional activities: LUE as diminished level LTG: Pt will use upper extremity in functional activity with assistance level of: Supervision/Verbal cueing   Problem: RH Toilet Transfers Goal: LTG Patient will perform toilet transfers w/assist (OT) Description: LTG: Patient will perform toilet transfers with assist, with/without cues using equipment (OT) Flowsheets (Taken 10/31/2024 1214) LTG: Pt will perform toilet transfers with assistance level of: Supervision/Verbal cueing   Problem: RH Tub/Shower Transfers Goal: LTG Patient will perform tub/shower transfers w/assist (OT) Description: LTG: Patient will perform tub/shower transfers with assist, with/without cues using equipment (OT) Flowsheets (Taken 10/31/2024 1214) LTG: Pt will perform tub/shower stall transfers with assistance level of: Contact Guard/Touching assist

## 2024-10-31 NOTE — Evaluation (Signed)
 Speech Language Pathology Assessment and Plan  Patient Details  Name: Stanley Stone MRN: [id number] Date of Birth: May 17, 1966  SLP Diagnosis:    Rehab Potential:   ELOS:      Today's Date: 10/31/2024 SLP Individual Time: 1300-1330 SLP Individual Time Calculation (min): 30 min   Hospital Problem: Principal Problem:   Infarction of left basal ganglia (HCC)  Past Medical History:  Past Medical History:  Diagnosis Date   AKI (acute kidney injury) 07/20/2022   Closed fracture dislocation of lumbar spine (HCC) 1987   COPD (chronic obstructive pulmonary disease) (HCC)    COVID-19 virus infection 07/20/2022   CVA (cerebral vascular accident) (HCC) 03/25/2022   Diabetes mellitus without complication (HCC)    GERD (gastroesophageal reflux disease)    High cholesterol    Homelessness 02/07/2019   Hypertension    Past Surgical History: History reviewed. No pertinent surgical history.  Assessment / Plan / Recommendation Clinical Impression HPI:  Pt is a 58 y.o. male with medical history significant of prior CVA, COPD, diabetes, hypertension, hyperlipidemia who presented to [facility] emergency department 10/25/24 due to slurred speech, vision changes and left-sided weakness. CTA head and neck showed carotid mass and severe stenosis of vertebral artery. MRI negative for acute stroke -remote L basal ganglia, L frontal and L PCA territory infarcts. CTA head/neck with severe L M1 MCA occlusion with more distal reconstitution, similar severe stenosis of the proximal L V2 vertebral artery Neurology consulted - Suspected recrudescence of old stroke sx due to HA and hypertensive emergency. Continue statin, aspirin . Parotid mass also noted on imaging, ENT consulted and recommended outpatient work up. Pt. Seen by PT/OT and they recommend CIR to assist return to PLOF.   Clinical Impression:  Bedside Swallow Evaluation: A bedside swallow evaluation was completed to assess for s/sx  of oropharyngeal dysphagia. Oral mechanism exam revealed generalized oral and facial weakness. POs administered included thin liquids and solids. Patient with timely mastication and complete oral clearance. No s/sx of aspiration present. Recommend regular/thin diet per BSE & recent MBS with use of standardized precautions including sitting upright during PO and taking small bites/sips at a slow rate. No further ST services re: dysphagia required.  Cognitive-Linguistic: Patient was evaluated via portions of the Cognistat. Patient is oriented x4 with intact expressive/receptive language, attention, awareness and simple problem solving. Patient does report some changes in memory secondary to old CVA , however no acute changes.  Dysarthria:  Patient presents with mild dysarthria characterized by mildly imprecise articulation and hoarse vocal quality (ENT consulted). Patient is aware of speech changes, however is 100% intelligible. Patient reports speech has improved over hospitalization. No f/u ST services required as patient is at baseline level of cognitive-linguistic functioning. Patient will have adequate cognitive assistance at d/c.    Skilled Therapeutic Interventions          Patient evaluated using a standardized cognitive linguistic assessment and bedside swallow evaluation to assess current cognitive, communicative and swallowing function. See above for details.    SLP Assessment  Patient does not need any further Speech Lanaguage Pathology Services    Recommendations  SLP Diet Recommendations: Age appropriate regular solids;Thin Liquid Administration via: Cup;Straw Medication Administration: Whole meds with liquid Supervision: Patient able to self feed Postural Changes and/or Swallow Maneuvers: Seated upright 90 degrees Oral Care Recommendations: Oral care BID Patient destination: Home Follow up Recommendations: None Equipment Recommended: None recommended by SLP     Pain None reported    Prior Functioning Type  of Home: House (Pt was living in a motel PTA; he will be d/c'ing home with his DTR)  Lives With: Alone Available Help at Discharge: Family Education: 10th grade Vocation: On disability  SLP Evaluation Cognition Overall Cognitive Status: History of cognitive impairments - at baseline Arousal/Alertness: Awake/alert Orientation Level: Oriented X4 Year: 2025 Month: December Day of Week: Correct Attention: Sustained Sustained Attention: Appears intact Memory: Impaired (baseline) Memory Impairment: Storage deficit;Retrieval deficit Awareness: Appears intact Problem Solving: Appears intact Safety/Judgment: Appears intact  Comprehension Auditory Comprehension Overall Auditory Comprehension: Appears within functional limits for tasks assessed Expression Expression Primary Mode of Expression: Verbal Verbal Expression Overall Verbal Expression: Appears within functional limits for tasks assessed Written Expression Dominant Hand: Right Oral Motor Oral Motor/Sensory Function Overall Oral Motor/Sensory Function: Mild impairment Motor Speech Overall Motor Speech: Impaired Respiration: Within functional limits Phonation: Hoarse (ENT consulted) Resonance: Within functional limits Articulation: Impaired Level of Impairment: Conversation Intelligibility: Intelligible  Care Tool Care Tool Cognition Ability to hear (with hearing aid or hearing appliances if normally used Ability to hear (with hearing aid or hearing appliances if normally used): 0. Adequate - no difficulty in normal conservation, social interaction, listening to TV   Expression of Ideas and Wants Expression of Ideas and Wants: 3. Some difficulty - exhibits some difficulty with expressing needs and ideas (e.g, some words or finishing thoughts) or speech is not clear   Understanding Verbal and Non-Verbal Content Understanding Verbal and Non-Verbal Content: 4. Understands (complex and basic) -  clear comprehension without cues or repetitions  Memory/Recall Ability Memory/Recall Ability : Current season;That he or she is in a hospital/hospital unit    Bedside Swallowing Assessment General Previous Swallow Assessment: MBS 10/25/24 Diet Prior to this Study: Regular;Thin liquids (Level 0) Respiratory Status: Room air Behavior/Cognition: Alert;Cooperative;Pleasant mood Oral Cavity - Dentition: Missing dentition Self-Feeding Abilities: Able to feed self Vision:  (impaired) Patient Positioning: Upright in chair/Tumbleform Volitional Cough: Weak Volitional Swallow: Able to elicit  Ice Chips Ice chips: Not tested Thin Liquid Thin Liquid: Within functional limits Presentation: Cup;Self Fed Nectar Thick Nectar Thick Liquid: Not tested Honey Thick Honey Thick Liquid: Not tested Puree Puree: Not tested Solid Solid: Within functional limits Presentation: Self Fed BSE Assessment Risk for Aspiration Impact on safety and function: Mild aspiration risk Other Related Risk Factors: Previous CVA;History of dysphagia   Recommendations for other services: None   Discharge Criteria: Patient will be discharged from SLP if patient refuses treatment 3 consecutive times without medical reason, if treatment goals not met, if there is a change in medical status, if patient makes no progress towards goals or if patient is discharged from hospital.  The above assessment, treatment plan, treatment alternatives and goals were discussed and mutually agreed upon: by patient  Penelope Fittro M.A., CCC-SLP 10/31/2024, 1:39 PM

## 2024-11-01 LAB — GLUCOSE, CAPILLARY
Glucose-Capillary: 153 mg/dL — ABNORMAL HIGH (ref 70–99)
Glucose-Capillary: 193 mg/dL — ABNORMAL HIGH (ref 70–99)
Glucose-Capillary: 262 mg/dL — ABNORMAL HIGH (ref 70–99)
Glucose-Capillary: 137 mg/dL — ABNORMAL HIGH (ref 70–99)

## 2024-11-01 MED ORDER — INSULIN GLARGINE 100 UNIT/ML ~~LOC~~ SOLN
37.0000 [IU] | Freq: Every day | SUBCUTANEOUS | Status: DC
  Administered 2024-11-01 – 2024-11-02 (×2): 37 [IU] via SUBCUTANEOUS
  Filled 2024-11-01 (×2): qty 0.37

## 2024-11-01 MED ORDER — TRAMADOL HCL 50 MG PO TABS
50.0000 mg | ORAL_TABLET | Freq: Two times a day (BID) | ORAL | Status: DC | PRN
  Administered 2024-11-01 – 2024-11-02 (×3): 50 mg via ORAL
  Filled 2024-11-01 (×3): qty 1

## 2024-11-01 NOTE — Plan of Care (Signed)
" °  Problem: RH BOWEL ELIMINATION Goal: RH STG MANAGE BOWEL WITH ASSISTANCE Description: STG Manage Bowel with mod I Assistance. Outcome: Progressing   Problem: RH BLADDER ELIMINATION Goal: RH STG MANAGE BLADDER WITH ASSISTANCE Description: STG Manage Bladder With mod I Assistance Outcome: Progressing   Problem: RH PAIN MANAGEMENT Goal: RH STG PAIN MANAGED AT OR BELOW PT'S PAIN GOAL Description: <4 w/ prns Outcome: Progressing   Problem: RH KNOWLEDGE DEFICIT Goal: RH STG INCREASE KNOWLEDGE OF HYPERTENSION Description: Manage knowledge deficit of hypertension  with mod I assistance from daughter using educational materials provided Outcome: Not Progressing Goal: RH STG INCREASE KNOWLEGDE OF HYPERLIPIDEMIA Description: Manage knowledge deficit of hyperlipidemia with mod I assistance from daughter using educational materials provided Outcome: Not Progressing Goal: RH STG INCREASE KNOWLEDGE OF STROKE PROPHYLAXIS Description: Manage knowledge deficit of stroke prophylaxis  with mod I assistance from daughter using educational materials provided Outcome: Not Progressing  Unable to tell if patient is understanding as he will not acknowledge "

## 2024-11-01 NOTE — Progress Notes (Signed)
 Patient ID: Stanley Stone, male   DOB: 12-24-1965, 58 y.o.   MRN: [id number]  Discharge date updated to 12/27.

## 2024-11-01 NOTE — Progress Notes (Signed)
 Occupational Therapy Note  Patient Details  Name: Stanley Stone MRN: [id number] Date of Birth: 1966/02/23  Today's Date: 11/01/2024 OT Missed Time: 60 Minutes Missed Time Reason: Pain  Returned for second OT session with pt reporting no immediately when therapist entered room. Pt reports continued pain through R neck and jaw declining session.     Stanley Stone [facility] 11/01/2024, 2:15 PM

## 2024-11-01 NOTE — Progress Notes (Signed)
 Physical Therapy Session Note  Patient Details  Name: Stanley Stone MRN: [id number] Date of Birth: 07/28/1966  Today's Date: 11/01/2024 PT Individual Time: 1257-1339 PT Individual Time Calculation (min): 42 min   Short Term Goals: Week 1:  PT Short Term Goal 1 (Week 1): Pt will perform bed mobility with S PT Short Term Goal 2 (Week 1): Pt will perform transfers with CGA PT Short Term Goal 3 (Week 1): Pt will perform gait x 50' with CGA PT Short Term Goal 4 (Week 1): Pt will perform stair negotatiation with CGA  Skilled Therapeutic Interventions/Progress Updates:    Pt presents in bed with RN in room initially. With encouragement, pt agreeable to attempt bed level exercises to continue to work on strength and muscular endurance/coordination. Instructed in ankle pumps x 20 reps BLE, heel slides x 15 reps BLE, glute and quad sets x 15 reps with 3-5 sec hold, hip abduction x 15 reps BLE, bridging x 15 reps - all repeated for 2 sets. Rest breaks between sets. Handout given (see below) for pt to do with family this weekend or to have nursing staff to assist him with reminders (due to his impaired vision unable to read paper). Cues for technique and use of breath for exhale on exertion.   Also discussed options for seated position if able to tolerate getting up to chair or EOB at a later time/this weekend. Pt verbalized understanding. Discussed and educated on overall goals, LOS, continued exercise upon d/c and d/c planning during rest breaks.    Access Code: [email or web address] URL: [email or web address] Date: 11/01/2024 Prepared by: Donald  Exercises - Supine Ankle Pumps  - 1 x daily - 7 x weekly - 3 sets - 10 reps - Supine Quadricep Sets  - 1 x daily - 7 x weekly - 3 sets - 10 reps - Supine Gluteal Sets  - 1 x daily - 7 x weekly - 3 sets - 10 reps - Supine Hip Abduction  - 1 x daily - 7 x weekly - 3 sets - 10 reps - Supine Heel Slide  - 1 x daily - 7 x weekly - 3 sets - 10 reps -  Supine Bridge  - 1 x daily - 7 x weekly - 3 sets - 10 reps - Seated Long Arc Quad  - 1 x daily - 7 x weekly - 3 sets - 10 reps - Seated March  - 1 x daily - 7 x weekly - 3 sets - 10 reps  Left in bed with HOB elevated and all needs in reach. Bed alarm intact.  Therapy Documentation Precautions:  Precautions Precautions: Fall Precaution/Restrictions Comments: legally blind, L hemi, ataxic Restrictions Weight Bearing Restrictions Per Provider Order: No   Pain: Still reporting pain R jaw/neck is too much to move and participate in upright therapy. Reports it is at 4/10. Declines ice. States it is most tolerable laying flat. Premedicated.     Therapy/Group: Individual Therapy  Elnor Donald Sherrell Donald WENDI Elnor, PT, DPT, CBIS  11/01/2024, 1:40 PM

## 2024-11-01 NOTE — Progress Notes (Signed)
 Occupational Therapy Note  Patient Details  Name: Stanley Stone MRN: [id number] Date of Birth: [birth year]  Today's Date: 11/01/2024 OT Missed Time: 30 Minutes Missed Time Reason: Pain  Pt greeted supine in bed reporting increased pain in R jaw down through his neck, pt politely declined session. Alerted nurse for pain meds, will continue efforts to see pt as time allows and pt medically appropriate.    Stanley Stone [facility] 11/01/2024, 12:06 PM

## 2024-11-01 NOTE — Progress Notes (Signed)
 "                                                        PROGRESS NOTE   Subjective/Complaints: C/o right neck swelling, discussed US  results, ENT follow-up.  Patient's chart reviewed- No issues reported overnight Vitals signs stable   ROS:+right sided neck swelling- continues, painful Objective:   US  SOFT TISSUE HEAD & NECK (NON-THYROID ) Result Date: 11/01/2024 EXAM: US  Right Neck Nonvascular Soft Tissue Ultrasound 10/31/2024 11:04:00 PM TECHNIQUE: Real-time ultrasound scan of the Right Neck with image documentation. COMPARISON: CTA head and neck 10/24/2024. CLINICAL HISTORY: Right neck swelling. FINDINGS: SOFT TISSUES: The palpable abnormality near the angle of the mandible on the right corresponds to a 1.3 x 1.2 x 1.3 cm mass which appears to be along the parotid tail. The mass is hypo- to anechoic with posterior acoustic enhancement and at least 1 internal septation. This likely corresponds to the smaller, heterogeneously enhancing mass at the tip of the right parotid tail on CT, while the larger right parotid region mass located more superiorly on CT was not clearly shown on this ultrasound. No fluid collection. IMPRESSION: 1. 1.3 cm mass near the tip of the right parotid tail, which could reflect a primary parotid neoplasm or pathologic lymph node. Recommend ENT referral. Electronically signed by: Dasie Hamburg MD 11/01/2024 09:07 AM EST RP Workstation: [id number]   Recent Labs    10/30/24 2023 10/31/24 0533  WBC 9.5 9.0  HGB 14.5 14.6  HCT 43.2 42.7  PLT 209 208   Recent Labs    10/30/24 0227 10/30/24 2023 10/31/24 0533  NA 137  --  140  K 4.1  --  4.0  CL 100  --  102  CO2 25  --  29  GLUCOSE 155*  --  105*  BUN 25*  --  23*  CREATININE 1.37* 1.26* 1.26*  CALCIUM  9.4  --  9.1    Intake/Output Summary (Last 24 hours) at 11/01/2024 1033 Last data filed at 11/01/2024 0700 Gross per 24 hour  Intake 1416 ml  Output 2875 ml  Net -1459 ml        Physical Exam: Vital  Signs Blood pressure 119/69, pulse 80, temperature 98.4 F (36.9 C), resp. rate 17, height 5' 5 (1.651 m), weight 76.3 kg, SpO2 94%. Gen: no distress, normal appearing HEENT: oral mucosa pink and moist, NCAT Cardio: Reg rate Chest: normal effort, normal rate of breathing Abd: soft, non-distended Ext: no edema Psych: pleasant, normal affect Skin: parotid mass on right side of neck Neuro: Aox3, LUE 2/5, LLE 3/5, sensation decreased left side. Impaired FTN left side, legally blind   Assessment/Plan: 1. Functional deficits which require 3+ hours per day of interdisciplinary therapy in a comprehensive inpatient rehab setting. Physiatrist is providing close team supervision and 24 hour management of active medical problems listed below. Physiatrist and rehab team continue to assess barriers to discharge/monitor patient progress toward functional and medical goals  Care Tool:  Bathing    Body parts bathed by patient: Left arm, Right arm, Chest, Abdomen, Front perineal area, Right upper leg, Left upper leg, Face   Body parts bathed by helper: Right arm, Buttocks, Left lower leg, Right lower leg     Bathing assist Assist Level: Moderate Assistance - Patient 50 - 74%  Upper Body Dressing/Undressing Upper body dressing   What is the patient wearing?: Hospital gown only    Upper body assist Assist Level: Minimal Assistance - Patient > 75%    Lower Body Dressing/Undressing Lower body dressing      What is the patient wearing?: Pants, Underwear/pull up     Lower body assist Assist for lower body dressing: Moderate Assistance - Patient 50 - 74%     Toileting Toileting    Toileting assist Assist for toileting: Moderate Assistance - Patient 50 - 74%     Transfers Chair/bed transfer  Transfers assist     Chair/bed transfer assist level: Moderate Assistance - Patient 50 - 74%     Locomotion Ambulation   Ambulation assist      Assist level: Moderate Assistance -  Patient 50 - 74% Assistive device: Walker-rolling Max distance: 45'   Walk 10 feet activity   Assist     Assist level: Moderate Assistance - Patient - 50 - 74% Assistive device: Walker-rolling   Walk 50 feet activity   Assist Walk 50 feet with 2 turns activity did not occur: Safety/medical concerns         Walk 150 feet activity   Assist Walk 150 feet activity did not occur: Safety/medical concerns         Walk 10 feet on uneven surface  activity   Assist Walk 10 feet on uneven surfaces activity did not occur: Safety/medical concerns         Wheelchair     Assist Is the patient using a wheelchair?: Yes Type of Wheelchair: Manual      Max wheelchair distance: 10'    Wheelchair 50 feet with 2 turns activity    Assist        Assist Level: Total Assistance - Patient < 25%   Wheelchair 150 feet activity     Assist      Assist Level: Total Assistance - Patient < 25%   Blood pressure 119/69, pulse 80, temperature 98.4 F (36.9 C), resp. rate 17, height 5' 5 (1.651 m), weight 76.3 kg, SpO2 94%.  Medical Problem List and Plan: 1. Functional deficits secondary to likely recrudescence of old stroke symptoms(remote left basal ganglia, left frontal and left PCA territory infarct) due to headache and hypertensive emergency             -patient may shower             -ELOS/Goals: 5-7 days S             Admit to CIR Chart and therapy notes reviewed, continue CIR, discussed patient's progress with therapy Grounds pass ordered Vitamin D3/Metanx/Vitamin B/C complex ordered F/u with me or Fidela in clinic within 1 month Would benefit from home health aide upon discharge  Messaged team to set d/c date -continue Lovenox              -antiplatelet therapy: Aspirin  325 mg daily  This patient is capable of making decisions on his own behalf.  2. Right sided neck swelling 2/2 parotid mass: US  ordered, f/u with ENT outpatient, asked Dan to please let  me know if ENT has evaluated this patient yet inpatient  3. Depression: continue Zoloft  50 mg daily              4.  Hypertension: d/c norvasc . Coreg  25 mg twice daily.  Monitor with increased mobility  5. Pain from right sided parotid mass: tramadol  ordered   10.  Hyperlipidemia.  Lipitor    11.  Diabetes mellitus.  Hemoglobin A1c 9.6.  Lantus  insulin  36 units daily, NovoLog  6 units 3 times daily, magnesium  supplement started   12.  History of tobacco alcohol and polysubstance abuse.  Urine drug screen negative.  NicoDerm patch.  Provide counseling   13.  GERD: continue Protonix    14.  Medical noncompliance.  Counseling   15.  Constipation: LBM 12/19, d/c miralax   LOS: 2 days A FACE TO FACE EVALUATION WAS PERFORMED  Stanley Stone 11/01/2024, 10:33 AM     "

## 2024-11-01 NOTE — Progress Notes (Signed)
 Physical Therapy Note  Patient Details  Name: Stanley Stone MRN: [id number] Date of Birth: 05-24-1966 Today's Date: 11/01/2024    Pt missed 60 min of skilled PT this AM due to refusal to participate due to pain in R neck area. Pt did not rate. Pt reports having had pain medication but the pain has not decreased and has increased since yesterday. RN and MD aware. Offered pt various options in room, at bed level, etc and pt continues to decline stating he just cannot do it right now. Pt states he was hopeful he might be able to in our session later this PM.   Elnor Donald Sherrell Donald WENDI Elnor, PT, DPT, CBIS  11/01/2024, 11:11 AM

## 2024-11-02 LAB — GLUCOSE, CAPILLARY
Glucose-Capillary: 129 mg/dL — ABNORMAL HIGH (ref 70–99)
Glucose-Capillary: 90 mg/dL (ref 70–99)
Glucose-Capillary: 190 mg/dL — ABNORMAL HIGH (ref 70–99)
Glucose-Capillary: 128 mg/dL — ABNORMAL HIGH (ref 70–99)

## 2024-11-02 MED ORDER — HYDROCODONE-ACETAMINOPHEN 5-325 MG PO TABS: 1.0000 | ORAL_TABLET | Freq: Four times a day (QID) | ORAL | Status: DC | PRN

## 2024-11-02 MED ORDER — HYDROCODONE-ACETAMINOPHEN 5-325 MG PO TABS
1.0000 | ORAL_TABLET | Freq: Three times a day (TID) | ORAL | Status: DC | PRN
  Administered 2024-11-02: 1 via ORAL
  Filled 2024-11-02: qty 1

## 2024-11-02 MED ORDER — PREDNISONE 20 MG PO TABS
20.0000 mg | ORAL_TABLET | Freq: Every day | ORAL | Status: DC
  Administered 2024-11-03 – 2024-11-05 (×3): 20 mg via ORAL
  Filled 2024-11-02 (×3): qty 1

## 2024-11-02 MED ORDER — INSULIN GLARGINE 100 UNIT/ML ~~LOC~~ SOLN
38.0000 [IU] | Freq: Every day | SUBCUTANEOUS | Status: DC
  Administered 2024-11-03: 38 [IU] via SUBCUTANEOUS
  Filled 2024-11-02: qty 0.38

## 2024-11-02 MED ORDER — SODIUM CHLORIDE 0.9 % IV SOLN
1.0000 g | Freq: Three times a day (TID) | INTRAVENOUS | Status: DC
  Administered 2024-11-02 – 2024-11-05 (×9): 1 g via INTRAVENOUS
  Filled 2024-11-02 (×10): qty 20

## 2024-11-02 MED ORDER — NICOTINE 14 MG/24HR TD PT24
14.0000 mg | MEDICATED_PATCH | Freq: Every day | TRANSDERMAL | Status: DC
  Administered 2024-11-03 – 2024-11-06 (×4): 14 mg via TRANSDERMAL
  Filled 2024-11-02 (×4): qty 1

## 2024-11-02 NOTE — IPOC Note (Signed)
 Overall Plan of Care [facility]) Patient Details Name: Stanley Stone MRN: [id number] DOB: Oct 21, 1966  Admitting Diagnosis: Infarction of left basal ganglia [facility])  Hospital Problems: Principal Problem:   Infarction of left basal ganglia (HCC)     Functional Problem List: Nursing Safety, Endurance, Medication Management, Pain  PT Balance, Edema, Endurance, Motor, Pain, Perception, Safety, Sensory, Skin Integrity  OT Balance, Safety, Sensory, Endurance, Motor, Pain, Perception, Edema, Nutrition, Skin Integrity  SLP    TR         Basic ADLs: OT Bathing, Dressing, Toileting, Grooming     Advanced  ADLs: OT       Transfers: PT Bed to Chair, Bed Mobility, Furniture, Customer Service Manager, Research Scientist (life Sciences): PT Psychologist, [employer], Ambulation, Stairs     Additional Impairments: OT Fuctional Use of Upper Extremity  SLP        TR      Anticipated Outcomes Item Anticipated Outcome  Self Feeding MOD I  Swallowing      Basic self-care  SUP-CGA  Toileting  CGA   Bathroom Transfers CGA  Bowel/Bladder  n/a  Transfers  supervision  Locomotion  supervision gait and stairs  Communication     Cognition     Pain  Pain < 4 with prns  Safety/Judgment  manage safety w cues   Therapy Plan: PT Intensity: Minimum of 1-2 x/day ,45 to 90 minutes PT Frequency: 5 out of 7 days PT Duration Estimated Length of Stay: 10-12 days OT Intensity: Minimum of 1-2 x/day, 45 to 90 minutes OT Frequency: 5 out of 7 days OT Duration/Estimated Length of Stay: 10-12 Days     Team Interventions: Nursing Interventions Patient/Family Education, Medication Management, Disease Management/Prevention, Pain Management, Discharge Planning  PT interventions Ambulation/gait training, Balance/vestibular training, Cognitive remediation/compensation, Community reintegration, Disease management/prevention, Discharge planning, DME/adaptive equipment instruction, Functional electrical stimulation,  Functional mobility training, Neuromuscular re-education, Pain management, Patient/family education, Psychosocial support, Skin care/wound management, Splinting/orthotics, Stair training, Therapeutic Activities, Therapeutic Exercise, UE/LE Strength taining/ROM, UE/LE Coordination activities, Visual/perceptual remediation/compensation, Wheelchair propulsion/positioning  OT Interventions Warden/ranger, Neuromuscular re-education, Disease mangement/prevention, Self Care/advanced ADL retraining, Therapeutic Exercise, Cognitive remediation/compensation, DME/adaptive equipment instruction, Pain management, Skin care/wound managment, UE/LE Strength taining/ROM, Community reintegration, Functional electrical stimulation, Patient/family education, Splinting/orthotics, UE/LE Coordination activities, Discharge planning, Functional mobility training, Psychosocial support, Therapeutic Activities, Visual/perceptual remediation/compensation  SLP Interventions    TR Interventions    SW/CM Interventions Discharge Planning, Psychosocial Support, Patient/Family Education, Disease Management/Prevention   Barriers to Discharge MD  Medical stability  Nursing Home environment access/layout, Lack of/limited family support resided in a hotel and went to the shelter for meals PTA; dtr reports he can stay with her at discharge.  PT None    OT None    SLP      SW       Team Discharge Planning: Destination: PT-Home (daughters house) ,OT- Home , SLP-Home Projected Follow-up: PT-Home health PT, 24 hour supervision/assistance, OT-  Home health OT, SLP-None Projected Equipment Needs: PT-To be determined, OT- To be determined, SLP-None recommended by SLP Equipment Details: PT-pt has rollator, OT-  Patient/family involved in discharge planning: PT- Patient,  OT-Patient, SLP-Patient  MD ELOS: 10 days Medical Rehab Prognosis:  Excellent Assessment: The patient has been admitted for CIR therapies with the  diagnosis of left basal ganglia infarction. The team will be addressing functional mobility, strength, stamina, balance, safety, adaptive techniques and equipment, self-care, bowel and bladder mgt, patient and caregiver education. Goals have been set at  supervision. Anticipated discharge destination is home.        See Team Conference Notes for weekly updates to the plan of care

## 2024-11-02 NOTE — Plan of Care (Signed)
" °  Problem: Consults Goal: RH STROKE PATIENT EDUCATION Description: See Patient Education module for education specifics  Outcome: Progressing Goal: Diabetes Guidelines if Diabetic/Glucose > 140 Description: If diabetic or lab glucose is > 140 mg/dl - Initiate Diabetes/Hyperglycemia Guidelines & Document Interventions  Outcome: Progressing   Problem: RH BOWEL ELIMINATION Goal: RH STG MANAGE BOWEL WITH ASSISTANCE Description: STG Manage Bowel with mod I Assistance. Outcome: Progressing   Problem: RH BLADDER ELIMINATION Goal: RH STG MANAGE BLADDER WITH ASSISTANCE Description: STG Manage Bladder With mod I Assistance Outcome: Progressing   Problem: RH SKIN INTEGRITY Goal: RH STG SKIN FREE OF INFECTION/BREAKDOWN Description: Manage skin free of infection with mod I assistance Outcome: Progressing   Problem: RH SAFETY Goal: RH STG ADHERE TO SAFETY PRECAUTIONS W/ASSISTANCE/DEVICE Description: STG Adhere to Safety Precautions With mod I Assistance/Device. Outcome: Progressing   Problem: RH PAIN MANAGEMENT Goal: RH STG PAIN MANAGED AT OR BELOW PT'S PAIN GOAL Description: <4 w/ prns Outcome: Progressing   Problem: RH KNOWLEDGE DEFICIT Goal: RH STG INCREASE KNOWLEDGE OF DIABETES Description: Manage knowledge deficit of diabetes with mod I assistance from daughter using educational materials provided Outcome: Progressing Goal: RH STG INCREASE KNOWLEDGE OF HYPERTENSION Description: Manage knowledge deficit of hypertension  with mod I assistance from daughter using educational materials provided Outcome: Progressing Goal: RH STG INCREASE KNOWLEGDE OF HYPERLIPIDEMIA Description: Manage knowledge deficit of hyperlipidemia with mod I assistance from daughter using educational materials provided Outcome: Progressing Goal: RH STG INCREASE KNOWLEDGE OF STROKE PROPHYLAXIS Description: Manage knowledge deficit of stroke prophylaxis  with mod I assistance from daughter using educational  materials provided Outcome: Progressing   "

## 2024-11-02 NOTE — Progress Notes (Addendum)
 Occupational Therapy Session Note  Patient Details  Name: Stanley Stone MRN: [id number] Date of Birth: 11-28-1965  Today's Date: 11/02/2024 OT Individual Time: 9244-9144 OT Individual Time Calculation (min): 60 min    Short Term Goals: Week 1:  OT Short Term Goal 1 (Week 1): Pt will tolerate standing using LRAD for >2 min during ADLs for functional activity OT Short Term Goal 2 (Week 1): Pt will complete toilet transfer MIN A using LRAD consistently OT Short Term Goal 3 (Week 1): Pt will complete LB dressing CGA using LRAD OT Short Term Goal 4 (Week 1): Pt will complete toileting with CGA using LRAD OT Short Term Goal 5 (Week 1): Pt will utilize L UE as a gross assist during ADLs with SUP  Skilled Therapeutic Interventions/Progress Updates:    Initial Encounter:  Patient resting in bed at the time of arrival with a pain response of 6 on 0-10 for R neck ;pain. The patient indicated that nursing was made aware and had addressed his pain with medication, however, it had not been long since it was administered. The pt was in agreement with completing a BADL related task in bathing at sink LOF in preparation for his day.   BADL Related Task:  The pt was able to come from supine in bed to EOB with SBA. The pt was able to come from EOB to stand using the RW with CGA squat pivot t/f.  The pt was transported to the sink and was able to turn on the warm water and retrieve a face cloth within reach for washing his face and UB with s/uA. The pt was able to comb his hair with s/uA as well. The pt was able to apply deo and donn his over head hospital shirt with s/uA.   NMR: The pt was able to tolerate minimum manual manipulation of the scapular and surrounding structures of the LUE, the pt presented with a pain response associate with the R side of his neck and presented with difficulty for turning his head to the R. The pt was able to bring the LUE into shld flexion and he was able to tolerate minimum  resistance incorporating the LUE against external force. The pt indicated a pain response of a 6 for the R neck region. The pt went on to complete the UB cycle while seated at w/c LOF.  The pt finished up the session completing sit to stands 3x at [facility] using the RW. The pt was able to transfer from w/c LOF to EOB usng the grab bars at CGA. The pt was able to manage BLE onto the bed with closeS. Prior to exiting the room, the call light and bedside table were placed within reach with the bed alarm activated.   Therapy Documentation Precautions:  Precautions Precautions: Fall Precaution/Restrictions Comments: legally blind, L hemi, ataxic Restrictions Weight Bearing Restrictions Per Provider Order: No  Therapy/Group: Individual Therapy  [name] 11/02/2024, 8:56 AM

## 2024-11-02 NOTE — Progress Notes (Signed)
 Occupational Therapy Session Note  Patient Details  Name: Stanley Stone MRN: [id number] Date of Birth: 09-25-1966  Today's Date: 11/02/2024 OT Individual Time: 0100-0215 OT Individual Time Calculation (min): 75 min    Short Term Goals: Week 1:  OT Short Term Goal 1 (Week 1): Pt will tolerate standing using LRAD for >2 min during ADLs for functional activity OT Short Term Goal 2 (Week 1): Pt will complete toilet transfer MIN A using LRAD consistently OT Short Term Goal 3 (Week 1): Pt will complete LB dressing CGA using LRAD OT Short Term Goal 4 (Week 1): Pt will complete toileting with CGA using LRAD OT Short Term Goal 5 (Week 1): Pt will utilize L UE as a gross assist during ADLs with SUP  Skilled Therapeutic Interventions/Progress Updates:    Patient in bed at the time of arrival with a pain response of 5 on 0-10 for R neck pain. The pt was in agreement with completing BADL related task in showering.   BADL Related Task :The pt was able to come from supine in bed ot EOB with SBA. The pt was able to transfer from EOB to w/c LOF with closeS. The pt was transported to the restroom with MinA. The pt was able to transfer from the w/c to the shower chair using the grab bars with CGA. The pt was able to doff his over head shirt with closeS, he was able to doff his pants using the grab bars with closeS. The pt was able to bathe UB/LB with close S. The pt was able to dry off with s/uA and close S for donning his UB/LB for a over head shirt with s/uA, he was able to donn his boxers and shorts with s/uA.  The pt was able to donn his non-skid socks with s/uA. The pt was able to transfer to the sink for brushing his teeth with s/uA.  UB Exercises: The pt went on to complete UB exercises using  3lb dumb bell for 2 sets of 10 for bicep curls, shld flexion, horizontal abduction, and elbow extension with rest breaks as needed. The pt went on to complete the UB cycle. At the end of the session, the pt  remained at w/c LOF with all additional needs addressed.  Therapy Documentation Precautions:  Precautions Precautions: Fall Precaution/Restrictions Comments: legally blind, L hemi, ataxic Restrictions Weight Bearing Restrictions Per Provider Order: No   Therapy/Group: Individual Therapy  [name] 11/02/2024, 3:56 PM

## 2024-11-02 NOTE — Progress Notes (Signed)
 Physical Therapy Session Note  Patient Details  Name: Winson Eichorn MRN: [id number] Date of Birth: Aug 23, 1966  Today's Date: 11/02/2024   Short Term Goals: Week 1:  PT Short Term Goal 1 (Week 1): Pt will perform bed mobility with S PT Short Term Goal 2 (Week 1): Pt will perform transfers with CGA PT Short Term Goal 3 (Week 1): Pt will perform gait x 50' with CGA PT Short Term Goal 4 (Week 1): Pt will perform stair negotatiation with CGA  Pt missed 60 min of skilled therapy due to pt reporting fatigue and neck pain (nsg aware and pt already provided with medication. Will re-attempt as schedule and pt availability permits.      Therapy Documentation Precautions:  Precautions Precautions: Fall Precaution/Restrictions Comments: legally blind, L hemi, ataxic Restrictions Weight Bearing Restrictions Per Provider Order: No   Therapy/Group: Individual Therapy  Darnetta Kesselman PTA 11/02/2024, 7:59 AM

## 2024-11-02 NOTE — Progress Notes (Signed)
 Occupational Therapy Session Note  Patient Details  Name: Stanley Stone MRN: [id number] Date of Birth: 02/10/1966  {CHL IP REHAB OT TIME CALCULATIONS:[id number]}   Short Term Goals: Week 1:  OT Short Term Goal 1 (Week 1): Pt will tolerate standing using LRAD for >2 min during ADLs for functional activity OT Short Term Goal 2 (Week 1): Pt will complete toilet transfer MIN A using LRAD consistently OT Short Term Goal 3 (Week 1): Pt will complete LB dressing CGA using LRAD OT Short Term Goal 4 (Week 1): Pt will complete toileting with CGA using LRAD OT Short Term Goal 5 (Week 1): Pt will utilize L UE as a gross assist during ADLs with SUP  Skilled Therapeutic Interventions/Progress Updates:    Patient agreeable to participate in OT session. Reports *** pain level.   Patient participated in skilled OT session focusing on ***. Therapist facilitated/assessed/developed/educated/integrated/elicited *** in order to improve/facilitate/promote    Therapy Documentation Precautions:  Precautions Precautions: Fall Precaution/Restrictions Comments: legally blind, L hemi, ataxic Restrictions Weight Bearing Restrictions Per Provider Order: No   Therapy/Group: Individual Therapy  Leita Howell, OTR/L,CBIS  Supplemental OT - [facility] and [facility] Secure Chat Preferred   11/02/2024, 11:06 PM

## 2024-11-02 NOTE — Progress Notes (Addendum)
 "                                                        PROGRESS NOTE   Subjective/Complaints: Continues to have painful right neck swelling and it is now making it difficult for him to talk, consulted ENT again today who recommended prednisone /antibiotics  ROS:+right sided neck swelling- continues, painful, making it difficult for him to talk Objective:   US  SOFT TISSUE HEAD & NECK (NON-THYROID ) Result Date: 11/01/2024 EXAM: US  Right Neck Nonvascular Soft Tissue Ultrasound 10/31/2024 11:04:00 PM TECHNIQUE: Real-time ultrasound scan of the Right Neck with image documentation. COMPARISON: CTA head and neck 10/24/2024. CLINICAL HISTORY: Right neck swelling. FINDINGS: SOFT TISSUES: The palpable abnormality near the angle of the mandible on the right corresponds to a 1.3 x 1.2 x 1.3 cm mass which appears to be along the parotid tail. The mass is hypo- to anechoic with posterior acoustic enhancement and at least 1 internal septation. This likely corresponds to the smaller, heterogeneously enhancing mass at the tip of the right parotid tail on CT, while the larger right parotid region mass located more superiorly on CT was not clearly shown on this ultrasound. No fluid collection. IMPRESSION: 1. 1.3 cm mass near the tip of the right parotid tail, which could reflect a primary parotid neoplasm or pathologic lymph node. Recommend ENT referral. Electronically signed by: Dasie Hamburg MD 11/01/2024 09:07 AM EST RP Workstation: [id number]   Recent Labs    10/30/24 2023 10/31/24 0533  WBC 9.5 9.0  HGB 14.5 14.6  HCT 43.2 42.7  PLT 209 208   Recent Labs    10/30/24 2023 10/31/24 0533  NA  --  140  K  --  4.0  CL  --  102  CO2  --  29  GLUCOSE  --  105*  BUN  --  23*  CREATININE 1.26* 1.26*  CALCIUM   --  9.1    Intake/Output Summary (Last 24 hours) at 11/02/2024 1506 Last data filed at 11/02/2024 1200 Gross per 24 hour  Intake 712 ml  Output 1850 ml  Net -1138 ml        Physical  Exam: Vital Signs Blood pressure 121/76, pulse 82, temperature 98.6 F (37 C), resp. rate 17, height 5' 5 (1.651 m), weight 76.3 kg, SpO2 90%. Gen: no distress, normal appearing HEENT: oral mucosa pink and moist, NCAT Cardio: Reg rate Chest: normal effort, normal rate of breathing Abd: soft, non-distended Ext: no edema Psych: pleasant, normal affect Skin: parotid mass on right side of neck Neuro: Aox3, LUE 2/5, LLE 3/5, sensation decreased left side. Impaired FTN left side, legally blind- stable 12/20   Assessment/Plan: 1. Functional deficits which require 3+ hours per day of interdisciplinary therapy in a comprehensive inpatient rehab setting. Physiatrist is providing close team supervision and 24 hour management of active medical problems listed below. Physiatrist and rehab team continue to assess barriers to discharge/monitor patient progress toward functional and medical goals  Care Tool:  Bathing    Body parts bathed by patient: Left arm, Right arm, Chest, Abdomen, Front perineal area, Right upper leg, Left upper leg, Face   Body parts bathed by helper: Right arm, Buttocks, Left lower leg, Right lower leg     Bathing assist Assist Level: Moderate Assistance - Patient 50 - 74%  Upper Body Dressing/Undressing Upper body dressing   What is the patient wearing?: Hospital gown only    Upper body assist Assist Level: Minimal Assistance - Patient > 75%    Lower Body Dressing/Undressing Lower body dressing      What is the patient wearing?: Pants, Underwear/pull up     Lower body assist Assist for lower body dressing: Moderate Assistance - Patient 50 - 74%     Toileting Toileting    Toileting assist Assist for toileting: Moderate Assistance - Patient 50 - 74%     Transfers Chair/bed transfer  Transfers assist     Chair/bed transfer assist level: Moderate Assistance - Patient 50 - 74%     Locomotion Ambulation   Ambulation assist      Assist  level: Moderate Assistance - Patient 50 - 74% Assistive device: Walker-rolling Max distance: 45'   Walk 10 feet activity   Assist     Assist level: Moderate Assistance - Patient - 50 - 74% Assistive device: Walker-rolling   Walk 50 feet activity   Assist Walk 50 feet with 2 turns activity did not occur: Safety/medical concerns         Walk 150 feet activity   Assist Walk 150 feet activity did not occur: Safety/medical concerns         Walk 10 feet on uneven surface  activity   Assist Walk 10 feet on uneven surfaces activity did not occur: Safety/medical concerns         Wheelchair     Assist Is the patient using a wheelchair?: Yes Type of Wheelchair: Manual      Max wheelchair distance: 10'    Wheelchair 50 feet with 2 turns activity    Assist        Assist Level: Total Assistance - Patient < 25%   Wheelchair 150 feet activity     Assist      Assist Level: Total Assistance - Patient < 25%   Blood pressure 121/76, pulse 82, temperature 98.6 F (37 C), resp. rate 17, height 5' 5 (1.651 m), weight 76.3 kg, SpO2 90%.  Medical Problem List and Plan: 1. Functional deficits secondary to likely recrudescence of old stroke symptoms(remote left basal ganglia, left frontal and left PCA territory infarct) due to headache and hypertensive emergency             -patient may shower             -ELOS/Goals: 5-7 days S             Admit to CIR Chart and therapy notes reviewed, continue CIR, discussed patient's progress with therapy Grounds pass ordered Vitamin D3 ordered F/u with me or Fidela in clinic within 1 month Would benefit from home health aide upon discharge  Messaged team to set d/c date -continue Lovenox              -antiplatelet therapy: Aspirin  325 mg daily  This patient is capable of making decisions on his own behalf.  2. Right sided neck swelling 2/2 parotid mass: US  ordered, consulted ENT again who said likely cause is  malignancy and patient should f/u with ENT outpatient, in mean time they recommended starting Augmentin BID in case of possible superimposed infection, as well as prednisone  20mg  daily for 7 days. Patient has an anaphylactic allergy to penicillins, meropenem  ordered after pharmacy consultation, f/u ENT Monday regarding duration of meropenem  based on patient's symptoms  3. Depression: continue Zoloft  50 mg daily  4.  Hypertension: d/c norvasc . Coreg  25 mg twice daily.  Monitor with increased mobility  5. Pain from right sided parotid mass: tramadol  ordered   10.  Hyperlipidemia.  Lipitor    11.  Diabetes mellitus.  Hemoglobin A1c 9.6.  increase Lantus  to 38U daily, NovoLog  6 units 3 times daily, magnesium  supplement started   12.  History of tobacco alcohol and polysubstance abuse.  Urine drug screen negative: Wean Nicoderm patch to 14mcg. Provide counseling   13.  GERD: continue Protonix    14.  Medical noncompliance.  Counseling   15.  Constipation: LBM 12/19, d/c miralax , decrease Norco to q8H prn  LOS: 3 days A FACE TO FACE EVALUATION WAS PERFORMED  Stanley Stone 11/02/2024, 3:06 PM     "

## 2024-11-03 LAB — GLUCOSE, CAPILLARY
Glucose-Capillary: 113 mg/dL — ABNORMAL HIGH (ref 70–99)
Glucose-Capillary: 187 mg/dL — ABNORMAL HIGH (ref 70–99)
Glucose-Capillary: 267 mg/dL — ABNORMAL HIGH (ref 70–99)
Glucose-Capillary: 295 mg/dL — ABNORMAL HIGH (ref 70–99)

## 2024-11-03 MED ORDER — INSULIN GLARGINE 100 UNIT/ML ~~LOC~~ SOLN
39.0000 [IU] | Freq: Every day | SUBCUTANEOUS | Status: DC
  Administered 2024-11-04: 39 [IU] via SUBCUTANEOUS
  Filled 2024-11-03: qty 0.39

## 2024-11-03 MED ORDER — HYDROCODONE-ACETAMINOPHEN 5-325 MG PO TABS
1.0000 | ORAL_TABLET | Freq: Two times a day (BID) | ORAL | Status: DC | PRN
  Administered 2024-11-06: 1 via ORAL
  Filled 2024-11-03: qty 1

## 2024-11-03 MED ORDER — SORBITOL 70 % SOLN
30.0000 mL | Freq: Once | Status: DC
  Filled 2024-11-03: qty 30

## 2024-11-03 NOTE — Progress Notes (Signed)
 "                                                        PROGRESS NOTE   Subjective/Complaints: Reports no relief in right neck swelling from prednisone  or IV meropenem , but is willing to participate with therapy today and made excellent progress  ROS:+right sided neck swelling- continues, painful, making it difficult for him to talk Objective:   No results found.  No results for input(s): WBC, HGB, HCT, PLT in the last 72 hours.  No results for input(s): NA, K, CL, CO2, GLUCOSE, BUN, CREATININE, CALCIUM  in the last 72 hours.   Intake/Output Summary (Last 24 hours) at 11/03/2024 1309 Last data filed at 11/03/2024 0750 Gross per 24 hour  Intake 728 ml  Output 2150 ml  Net -1422 ml        Physical Exam: Vital Signs Blood pressure 130/78, pulse 64, temperature 97.6 F (36.4 C), resp. rate 16, height 5' 5 (1.651 m), weight 76.3 kg, SpO2 92%. Gen: no distress, normal appearing HEENT: oral mucosa pink and moist, NCAT Cardio: Reg rate Chest: normal effort, normal rate of breathing Abd: soft, non-distended Ext: no edema Psych: pleasant, normal affect Skin: parotid mass on right side of neck Neuro: Aox3, LUE 2/5, LLE 3/5, sensation decreased left side. Impaired FTN left side, legally blind- stable 12/21   Assessment/Plan: 1. Functional deficits which require 3+ hours per day of interdisciplinary therapy in a comprehensive inpatient rehab setting. Physiatrist is providing close team supervision and 24 hour management of active medical problems listed below. Physiatrist and rehab team continue to assess barriers to discharge/monitor patient progress toward functional and medical goals  Care Tool:  Bathing    Body parts bathed by patient: Left arm, Right arm, Chest, Abdomen, Front perineal area, Right upper leg, Left upper leg, Face   Body parts bathed by helper: Right arm, Buttocks, Left lower leg, Right lower leg     Bathing assist Assist Level:  Moderate Assistance - Patient 50 - 74%     Upper Body Dressing/Undressing Upper body dressing   What is the patient wearing?: Hospital gown only    Upper body assist Assist Level: Minimal Assistance - Patient > 75%    Lower Body Dressing/Undressing Lower body dressing      What is the patient wearing?: Pants, Underwear/pull up     Lower body assist Assist for lower body dressing: Moderate Assistance - Patient 50 - 74%     Toileting Toileting    Toileting assist Assist for toileting: Moderate Assistance - Patient 50 - 74%     Transfers Chair/bed transfer  Transfers assist     Chair/bed transfer assist level: Moderate Assistance - Patient 50 - 74%     Locomotion Ambulation   Ambulation assist      Assist level: Moderate Assistance - Patient 50 - 74% Assistive device: Walker-rolling Max distance: 45'   Walk 10 feet activity   Assist     Assist level: Moderate Assistance - Patient - 50 - 74% Assistive device: Walker-rolling   Walk 50 feet activity   Assist Walk 50 feet with 2 turns activity did not occur: Safety/medical concerns         Walk 150 feet activity   Assist Walk 150 feet activity did not occur: Safety/medical concerns  Walk 10 feet on uneven surface  activity   Assist Walk 10 feet on uneven surfaces activity did not occur: Safety/medical concerns         Wheelchair     Assist Is the patient using a wheelchair?: Yes Type of Wheelchair: Manual      Max wheelchair distance: 10'    Wheelchair 50 feet with 2 turns activity    Assist        Assist Level: Total Assistance - Patient < 25%   Wheelchair 150 feet activity     Assist      Assist Level: Total Assistance - Patient < 25%   Blood pressure 130/78, pulse 64, temperature 97.6 F (36.4 C), resp. rate 16, height 5' 5 (1.651 m), weight 76.3 kg, SpO2 92%.  Medical Problem List and Plan: 1. Functional deficits secondary to likely  recrudescence of old stroke symptoms(remote left basal ganglia, left frontal and left PCA territory infarct) due to headache and hypertensive emergency             -patient may shower             -ELOS/Goals: 5-7 days S             Therapy notes reviewed and ambulated well with therapy today  Continue CIR, discussed patient's progress with therapy Grounds pass ordered Vitamin D3 ordered F/u with Dr. Lorilee or Fidela in clinic within 1 month Would benefit from home health aide upon discharge  Messaged team to set d/c date -continue Lovenox              -antiplatelet therapy: Aspirin  325 mg daily  This patient is capable of making decisions on his own behalf.  2. Right sided neck swelling 2/2 parotid mass: US  ordered, consulted ENT again who said likely cause is malignancy and patient should f/u with ENT outpatient, in mean time they recommended starting Augmentin BID in case of possible superimposed infection, as well as prednisone  20mg  daily for 7 days. Patient has an anaphylactic allergy to penicillins, meropenem  ordered after pharmacy consultation, f/u ENT Monday regarding duration of meropenem  based on patient's symptoms, discussed ENT recommendations with patient  3. Depression: continue Zoloft  50 mg daily              4.  Hypertension: d/c norvasc . Coreg  25 mg twice daily.  Monitor with increased mobility  5. Pain from right sided parotid mass: tramadol  ordered   10.  Hyperlipidemia.  Lipitor    11.  Diabetes mellitus.  Hemoglobin A1c 9.6.  increase Lantus  to 39U daily, NovoLog  6 units 3 times daily, magnesium  supplement started   12.  History of tobacco alcohol and polysubstance abuse.  Urine drug screen negative: Wean Nicoderm patch to 14mcg. Provide counseling   13.  GERD: continue Protonix    14.  Medical noncompliance.  Counseling   15.  Constipation: LBM 12/19, sorbitol  ordered 12/21, decrease Norco to q12H prn  LOS: 4 days A FACE TO FACE EVALUATION WAS  PERFORMED  Stanley Stone 11/03/2024, 1:09 PM     "

## 2024-11-03 NOTE — Patient Instructions (Signed)
 Theraputty Home Exercise Program  Complete 1-2 times a day.  putty squeeze  Pt. should squeeze putty in hand trying to keep it round by rotating putty after each squeeze. push fingers through putty to palm each time. Complete  _10__ squeezes.   PUTTY KEY GRIP  Hold the putty at the top of your hand. Squeeze the putty between your thumb and the side of your 2nd finger as shown. Complete  __10__pinches.    PUTTY 3 JAW CHUCK  Roll up some putty into a ball then flatten it. Then, firmly squeeze it with your first 3 fingers as shown. Complete ___10___ pinches.   Bead Search  Hide beads in putty and locate them using left hand only.

## 2024-11-03 NOTE — Progress Notes (Signed)
 Patient refused scheduled sorbital, educated on why it was prescribed, patient understands and still refuses

## 2024-11-03 NOTE — Progress Notes (Signed)
 Physical Therapy Session Note  Patient Details  Name: Stanley Stone MRN: [id number] Date of Birth: [birth year]  Today's Date: 11/03/2024 PT Individual Time: 1000-1110 PT Individual Time Calculation (min): 70 min   Short Term Goals: Week 1:  PT Short Term Goal 1 (Week 1): Pt will perform bed mobility with S PT Short Term Goal 2 (Week 1): Pt will perform transfers with CGA PT Short Term Goal 3 (Week 1): Pt will perform gait x 50' with CGA PT Short Term Goal 4 (Week 1): Pt will perform stair negotatiation with CGA  Skilled Therapeutic Interventions/Progress Updates:    Pt asleep in bed on arrival but easily roused and agreeable to therapy. Pt initially reports pain controlled, but reports tightness and jaw pain unchanged during MD rounding. Supine>sit with supervision and bed features. Pt able to don slippers with set up. Min a for Stand pivot transfer with RW, noting ataxia. Sit to stand with as little as supervision to RW.  Pt transported to therapy gym for time management and energy conservation. Pt ambulated 5 x 40 ft  and then 1 x 80 with RW and min a, w/c in tow for safety. Note ataxia and pt reporting fatigue with activity. Added 1.5 lb ankle weights to increase difficulty and proprioception for improved coordination.  With weights in place, pt performed step taps on 7.5 step, 3 x 40 for BLE strength and endurance. Noted improved coordination and endurance with repetition.  Transitioned to side stepping, maintained 1.5 lb weights for proprioception. x 12 ft each direction and then 3 x 12 ft laps each direction without rest break. Transitioned to nustep on double hills profile, load interval 6-12, 10 min for global strength and endurance.  Propelled w/c back to room for endurance and functional mobility.   Pt remained seated up in w/c at end of session, was left with all needs in reach and alarm active.   Therapy Documentation Precautions:  Precautions Precautions:  Fall Precaution/Restrictions Comments: legally blind, L hemi, ataxic Restrictions Weight Bearing Restrictions Per Provider Order: No General:       Therapy/Group: Individual Therapy  [name] 11/03/2024, 10:14 AM

## 2024-11-03 NOTE — Progress Notes (Signed)
 Physical Therapy Session Note  Patient Details  Name: Stanley Stone MRN: [id number] Date of Birth: 26-Dec-1965  Today's Date: 11/03/2024 PT Individual Time: 8696-8656  PT Individual Time Calculation (min): 40 min  Short Term Goals: Week 1:  PT Short Term Goal 1 (Week 1): Pt will perform bed mobility with S PT Short Term Goal 2 (Week 1): Pt will perform transfers with CGA PT Short Term Goal 3 (Week 1): Pt will perform gait x 50' with CGA PT Short Term Goal 4 (Week 1): Pt will perform stair negotatiation with CGA  Skilled Therapeutic Interventions/Progress Updates:  Chart reviewed and pt agreeable to therapy. Pt received seated in WC with 0/10 c/o pain. Session focused on functional transfers, balance, ambulation, and activity tolerance to promote safe home mobility and access. Pt initiated session with 153ft am using CGA + RW. In gym, pt completed toe taps to cones with noted challenge with placing L foot at intended target. Pt repeated exercise with 1.5# ankle weight on LLE with noted improvement with correct foot placement. Pt then reduced to 0.5# ankle weight with noted increase in difficulty for foot placement, but pt improved ability with practice. Pt then amb with 1.5# ankle weight on LLE. Pt then amb to room with CGA + RW with no ankle weights. In room, pt completed SLS for 3x30sec per leg with S + RW for RLE and CGA + RW for LLE. At end of session, pt was left seated in [facility] with alarm engaged, nurse call bell and all needs in reach.     Therapy Documentation Precautions:  Precautions Precautions: Fall Precaution/Restrictions Comments: legally blind, L hemi, ataxic Restrictions Weight Bearing Restrictions Per Provider Order: No General:      Therapy/Group: Individual Therapy   [name] 11/03/2024, 1:45 PM

## 2024-11-03 NOTE — Progress Notes (Signed)
 Occupational Therapy Session Note  Patient Details  Name: Stanley Stone MRN: [id number] Date of Birth: [birth year]  Today's Date: 11/03/2024 OT Individual Time: 1115-1200 OT Individual Time Calculation (min): 45 min    Short Term Goals: Week 1:  OT Short Term Goal 1 (Week 1): Pt will tolerate standing using LRAD for >2 min during ADLs for functional activity OT Short Term Goal 2 (Week 1): Pt will complete toilet transfer MIN A using LRAD consistently OT Short Term Goal 3 (Week 1): Pt will complete LB dressing CGA using LRAD OT Short Term Goal 4 (Week 1): Pt will complete toileting with CGA using LRAD OT Short Term Goal 5 (Week 1): Pt will utilize L UE as a gross assist during ADLs with SUP  Skilled Therapeutic Interventions/Progress Updates:   Initial Encounter :  Patient seated at w/c LOF at the time of arrival watching television. The pt indicated that he rested well during the night with no pain to report at the time of treatment. The pt was in agreement with complete UB exercises to improve his activity tolerance for gains with functional Ind.    UB Exercises: The pt was able to roll himself to the gym >200 feet with 1 rest break. The pt went on to complete the NuStep for 7 minutes in duration with 2  rest breaks. The pt was able to complete UB exercises using a 3lb dowel 2 sets of 10 for shld flex, horizontal abduction, shld rotation, and lg circles with 2 rest breaks. The pt went on roll himself back to his room > 170ft. The pt was transported the remainder of the way. . At the end of the session, the pt remained at w/c LOF with the chair alarm in place and all additional needs addressed.   Therapy Documentation Precautions:  Precautions Precautions: Fall Precaution/Restrictions Comments: legally blind, L hemi, ataxic Restrictions Weight Bearing Restrictions Per Provider Order: No  Therapy/Group: Individual Therapy  [name] 11/03/2024, 12:58 PM

## 2024-11-04 DIAGNOSIS — K118 Other diseases of salivary glands: Secondary | ICD-10-CM

## 2024-11-04 LAB — GLUCOSE, CAPILLARY
Glucose-Capillary: 205 mg/dL — ABNORMAL HIGH (ref 70–99)
Glucose-Capillary: 181 mg/dL — ABNORMAL HIGH (ref 70–99)
Glucose-Capillary: 339 mg/dL — ABNORMAL HIGH (ref 70–99)
Glucose-Capillary: 190 mg/dL — ABNORMAL HIGH (ref 70–99)

## 2024-11-04 MED ORDER — INSULIN GLARGINE 100 UNIT/ML ~~LOC~~ SOLN
43.0000 [IU] | Freq: Every day | SUBCUTANEOUS | Status: DC
  Administered 2024-11-05 – 2024-11-06 (×2): 43 [IU] via SUBCUTANEOUS
  Filled 2024-11-04 (×2): qty 0.43

## 2024-11-04 NOTE — Progress Notes (Signed)
 Physical Therapy Session Note  Patient Details  Name: Stanley Stone MRN: [id number] Date of Birth: 1966/04/18  Today's Date: 11/04/2024 PT Individual Time: 9197-9143 + [id number]  PT Individual Time Calculation (min): 54 min  + 43 min  Short Term Goals: Week 1:  PT Short Term Goal 1 (Week 1): Pt will perform bed mobility with S PT Short Term Goal 2 (Week 1): Pt will perform transfers with CGA PT Short Term Goal 3 (Week 1): Pt will perform gait x 50' with CGA PT Short Term Goal 4 (Week 1): Pt will perform stair negotatiation with CGA  Skilled Therapeutic Interventions/Progress Updates:     1st session: Pt sleeping in bed to start - awakens to voice. Pt slow to wake, reports he's not a morning person and that he typically wakes up around 11-12am. He reports he typically doesn't fall asleep until 3-4am. Able to entice patient to mobilize by offering coffee. Pt reports he's not in any pain.   Supine<>sitting EOB completed with supervision. Pt able to don his pants with CGA for balance while standing as he pulls pants over hips. Pt with LOB posteriorly that needs assist for correction. Pt able to don slippers, zip pants, and button pants without assist. Pt reporting need to use toilet before leaving his room. Sit<>stand to RW with CGA and ambulates with CGA and RW  within his room, gait ataxic and unsteady. Pt continent x2, completing pericare without assist. Charted continence in flowsheets.   Pt ambulated from his room to day room gym, ~150' with CGA and RW - gait remains ataxic with inconsistent step lengths bilaterally, relies heavily on UE support from RW for his balance.   Pt assisted onto seated stepper (kinetron) with minA and he completed x8 minutes at L60cm/sec resistance. Emphasis on coordination, equal stepping power bilaterally, and adequate cadence.   Worked on dynamic standing balance: -repeated sit<>stands while holding ball at chest height + overhead press at top of stand,  2x10, close supervision. Pt relies on back of legs for support so practiced decreasing reliance on this for stabiltiy.  -gait ~55' while holding ball in both hands, min/modA for balance due to ataxia and balance deficits. Pt needing several stops to correct and regain his balance due to unsteadiness.  -Gait 150' with RW and CGA with emphasis on coordination, equal step lengths, controlling legs and trunk, and relying less on UE support through walker. Pt reports he uses rollator at baseline - will trial one this PM for safety check.   Pt ended session seated in recliner with chair pad alarm on. Pt made aware of his therapy schedule. All needs met.     2nd session: Pt resting in bed to start - in agreement to therapy. He has no complaints of pain, ready to get his therapy over with for the day.   Supine<>sitting EOB mod I with hospital bed features. Donned slippers without assist as he sat EOB. Sit<>stand to RW with CGA and ambulates with CGA and RW from his room to main gym, ~150'. Continues to have significant ataxia in his gait from BLE and trunk.   Pt instructed in stair training with 6 steps and 2 hand rails. Pt reports being fearful of stairs and avoids them as much as he can. Pt demonstrated ability to navigate x12, 6 steps with a step-to pattern and descend x12 steps with a step-to pattern. Pt completes them while forward facing. He takes his time and controls himself well.   Pt inquiring  on returning to using a rollator rather than RW - retrieved a rollator and discussed safety precautions and general use. Pt familiar with device and demonstrates appropriate understanding of managing brakes during mobility.   Standing and balance retraining in // bars: -feet together on firm surface eyes open - supervision, mild sway -feet together on firm surface eyes closed - CGA, moderate sway -feet together on firm surface eyes open with head rotation, supervision, mild sway -standing heel  raises with BUE support 1x10 -standing heel raises without BUE support 1x10 -lateral step ups 1x10 on L + 1x10 on R with BUE support *no seated rest, brief standing rest breaks provided.   Pt ambulated back to his room at supervision level with the rollator - min cues for direction only. Pt elected to rest in bed due to heavy morning of therapies - bed mobility completed without assist. Needs met, alarm on.    Therapy Documentation Precautions:  Precautions Precautions: Fall Precaution/Restrictions Comments: legally blind, L hemi, ataxic Restrictions Weight Bearing Restrictions Per Provider Order: No General:      Therapy/Group: Individual Therapy  Angy Swearengin P Ashlan Dignan 11/04/2024, 8:10 AM

## 2024-11-04 NOTE — Progress Notes (Addendum)
 "                                                        PROGRESS NOTE   Subjective/Complaints:  No c/o today , finished therapy and just finished lunch , slept ok ROS:+right sided neck swelling- no SOB, no sweats or chills no problem swallowing (ate 100% lunch)  Objective:   No results found.  No results for input(s): WBC, HGB, HCT, PLT in the last 72 hours.  No results for input(s): NA, K, CL, CO2, GLUCOSE, BUN, CREATININE, CALCIUM  in the last 72 hours.   Intake/Output Summary (Last 24 hours) at 11/04/2024 1212 Last data filed at 11/04/2024 9257 Gross per 24 hour  Intake 1196 ml  Output 2975 ml  Net -1779 ml        Physical Exam: Vital Signs Blood pressure (!) 149/78, pulse 60, temperature 98 F (36.7 C), temperature source Oral, resp. rate 16, height 5' 5 (1.651 m), weight 76.3 kg, SpO2 96%.  General: No acute distress Mood and affect are appropriate Heart: Regular rate and rhythm no rubs murmurs or extra sounds Lungs: Clear to auscultation, breathing unlabored, no rales or wheezes Abdomen: Positive bowel sounds, soft nontender to palpation, nondistended Extremities: No clubbing, cyanosis, or edema Skin: No evidence of breakdown, no evidence of rash   ENT parotid mass on right side of neck, tender to palpation  Neuro: Aox3, LUE 3-/5, LLE 3/5, sensation intact to LT bilateral Impaired FTN left side, legally blind- stable 12/21   Assessment/Plan: 1. Functional deficits which require 3+ hours per day of interdisciplinary therapy in a comprehensive inpatient rehab setting. Physiatrist is providing close team supervision and 24 hour management of active medical problems listed below. Physiatrist and rehab team continue to assess barriers to discharge/monitor patient progress toward functional and medical goals  Care Tool:  Bathing    Body parts bathed by patient: Left arm, Right arm, Chest, Abdomen, Front perineal area, Right upper leg, Left  upper leg, Face   Body parts bathed by helper: Right arm, Buttocks, Left lower leg, Right lower leg     Bathing assist Assist Level: Moderate Assistance - Patient 50 - 74%     Upper Body Dressing/Undressing Upper body dressing   What is the patient wearing?: Hospital gown only    Upper body assist Assist Level: Minimal Assistance - Patient > 75%    Lower Body Dressing/Undressing Lower body dressing      What is the patient wearing?: Pants, Underwear/pull up     Lower body assist Assist for lower body dressing: Moderate Assistance - Patient 50 - 74%     Toileting Toileting    Toileting assist Assist for toileting: Moderate Assistance - Patient 50 - 74%     Transfers Chair/bed transfer  Transfers assist     Chair/bed transfer assist level: Moderate Assistance - Patient 50 - 74%     Locomotion Ambulation   Ambulation assist      Assist level: Moderate Assistance - Patient 50 - 74% Assistive device: Walker-rolling Max distance: 45'   Walk 10 feet activity   Assist     Assist level: Moderate Assistance - Patient - 50 - 74% Assistive device: Walker-rolling   Walk 50 feet activity   Assist Walk 50 feet with 2 turns activity did not occur: Safety/medical  concerns         Walk 150 feet activity   Assist Walk 150 feet activity did not occur: Safety/medical concerns         Walk 10 feet on uneven surface  activity   Assist Walk 10 feet on uneven surfaces activity did not occur: Safety/medical concerns         Wheelchair     Assist Is the patient using a wheelchair?: Yes Type of Wheelchair: Manual      Max wheelchair distance: 10'    Wheelchair 50 feet with 2 turns activity    Assist        Assist Level: Total Assistance - Patient < 25%   Wheelchair 150 feet activity     Assist      Assist Level: Total Assistance - Patient < 25%   Blood pressure (!) 149/78, pulse 60, temperature 98 F (36.7 C), temperature  source Oral, resp. rate 16, height 5' 5 (1.651 m), weight 76.3 kg, SpO2 96%.  Medical Problem List and Plan: 1. Functional deficits secondary to likely recrudescence of old stroke symptoms(remote left basal ganglia, left frontal and left PCA territory infarct) due to headache and hypertensive emergency             -patient may shower             -ELOS/Goals: 11/09/24             Therapy notes reviewed and ambulated well with therapy today  Continue CIR, discussed patient's progress with therapy Grounds pass ordered Vitamin D3 ordered F/u with Dr. Lorilee or Fidela in clinic within 1 month Would benefit from home health aide upon discharge   -continue Lovenox              -antiplatelet therapy: Aspirin  325 mg daily  This patient is capable of making decisions on his own behalf.  2. Right sided neck swelling 2/2 parotid mass: US  ordered, consulted ENT again who said likely cause is malignancy and patient should f/u with ENT outpatient, in mean time they recommended starting Augmentin BID in case of possible superimposed infection, as well as prednisone  20mg  daily for 7 days. Patient has an anaphylactic allergy to penicillins, meropenem  ordered after pharmacy consultation, touched base with Dr Roark who did not think there was infection when he saw pt.  So far no improvement of pain on steroids and abx x 3d, pt remains afebrile , primary service will re eval in am     Latest Ref Rng & Units 10/31/2024    5:33 AM 10/30/2024    8:23 PM 10/30/2024    2:27 AM  CBC  WBC 4.0 - 10.5 K/uL 9.0  9.5  8.6   Hemoglobin 13.0 - 17.0 g/dL 85.3  85.4  83.4   Hematocrit 39.0 - 52.0 % 42.7  43.2  48.8   Platelets 150 - 400 K/uL 208  209  200    Would not check CBC unless febrile as pt is on steroids now  3. Depression: continue Zoloft  50 mg daily              4.  Hypertension: d/c norvasc . Coreg  25 mg twice daily.  Monitor with increased mobility Vitals:   11/03/24 2023 11/04/24 0440  BP: 111/67 (!)  149/78  Pulse: 76 60  Resp: 17 16  Temp: 98.3 F (36.8 C) 98 F (36.7 C)  SpO2: 97% 96%   Cont to monitor may need to add back low dose amlodipine   if BP remains elevated  5. Pain from right sided parotid mass: tramadol  ordered   10.  Hyperlipidemia.  Lipitor    11.  Diabetes mellitus.  Hemoglobin A1c 9.6.  increase Lantus  to 43U daily, NovoLog  6 units 3 times daily, magnesium  supplement started  CBG (last 3)  Recent Labs    11/03/24 2121 11/04/24 0546 11/04/24 1206  GLUCAP 295* 205* 181*    12.  History of tobacco alcohol and polysubstance abuse.  Urine drug screen negative: Wean Nicoderm patch to 14mcg. Provide counseling   13.  GERD: continue Protonix    14.  Medical noncompliance.  Counseling   15.  Constipation: LBM 12/19, sorbitol  ordered 12/21, decrease Norco to q12H prn  LOS: 5 days A FACE TO FACE EVALUATION WAS PERFORMED  Stanley Stone 11/04/2024, 12:12 PM     "

## 2024-11-04 NOTE — Progress Notes (Signed)
 Occupational Therapy Session Note  Patient Details  Name: Stanley Stone MRN: [id number] Date of Birth: Jan 26, 1966  Today's Date: 11/04/2024 OT Individual Time: 9099-9057 OT Individual Time Calculation (min): 42 min    Short Term Goals: Week 1:  OT Short Term Goal 1 (Week 1): Pt will tolerate standing using LRAD for >2 min during ADLs for functional activity OT Short Term Goal 2 (Week 1): Pt will complete toilet transfer MIN A using LRAD consistently OT Short Term Goal 3 (Week 1): Pt will complete LB dressing CGA using LRAD OT Short Term Goal 4 (Week 1): Pt will complete toileting with CGA using LRAD OT Short Term Goal 5 (Week 1): Pt will utilize L UE as a gross assist during ADLs with SUP  Skilled Therapeutic Interventions/Progress Updates: Patient received sitting up in recliner. Agreeable to OT treatment, but not wanting to work on ADL's at this time. Patient agreeable to ambulating to therapy gym for NMRE of the LUE. Prior to walking to gym patient performed toileting with distant  SPV. Ambulating with RW into the bathroom and standing at the sink for hand hygiene. Once in the therapy gym worked on in hand manipulative focusing on in hand storage with FM control between digit 1-2. Patient with good display of FM coordination and minimal sensory deficits apparent. Continued with grasp and pinch strength and endurance using 8# clothes pin for grasp release activity. Patient not limited by low vision during table top activities. Continued with strengthening using 4# hand weight for 3 x 10 of wrist flexion, extension and supination pronation. Ambulated back to room and patient deciding to lie down to rest between treatments. Bed alarm set and call bell and phone with in reach. Continue with skilled OT POC.      Therapy Documentation Precautions:  Precautions Precautions: Fall Precaution/Restrictions Comments: legally blind, L hemi, ataxic Restrictions Weight Bearing Restrictions Per  Provider Order: No General:   Vital Signs:   Pain: 0/10 reported   ADL: ADL Eating: Set up Where Assessed-Eating: Chair Grooming: Setup Where Assessed-Grooming: Sitting at sink Upper Body Bathing: Minimal assistance Where Assessed-Upper Body Bathing: Sitting at sink Lower Body Bathing: Moderate assistance, Minimal assistance Where Assessed-Lower Body Bathing: Sitting at sink, Standing at sink Upper Body Dressing: Minimal assistance, Supervision/safety Where Assessed-Upper Body Dressing: Sitting at sink, Wheelchair Lower Body Dressing: Moderate assistance, Minimal assistance Where Assessed-Lower Body Dressing: Sitting at sink, Wheelchair Toileting: Minimal assistance, Moderate assistance Where Assessed-Toileting: Bedside Commode Toilet Transfer: Minimal assistance, Moderate assistance Toilet Transfer Method: Stand pivot Toilet Transfer Equipment: Bedside commode Tub/Shower Transfer: Not assessed Film/video Editor: Not assessed    Therapy/Group: Individual Therapy  [name] 11/04/2024, 9:40 AM

## 2024-11-04 NOTE — Plan of Care (Signed)
  Problem: Consults Goal: RH STROKE PATIENT EDUCATION Description: See Patient Education module for education specifics  Outcome: Progressing   Problem: RH SKIN INTEGRITY Goal: RH STG SKIN FREE OF INFECTION/BREAKDOWN Description: Manage skin free of infection with mod I assistance Outcome: Progressing

## 2024-11-04 NOTE — Progress Notes (Signed)
 Patient ID: Stanley Stone, male   DOB: 03/13/1966, 58 y.o.   MRN: [id number]  Placed order for rollator via [employer]

## 2024-11-04 NOTE — Progress Notes (Signed)
 Occupational Therapy Session Note  Patient Details  Name: Stanley Stone MRN: [id number] Date of Birth: 01-06-1966  Today's Date: 11/04/2024 OT Individual Time: 1005-1102 OT Individual Time Calculation (min): 57 min    Short Term Goals: Week 1:  OT Short Term Goal 1 (Week 1): Pt will tolerate standing using LRAD for >2 min during ADLs for functional activity OT Short Term Goal 2 (Week 1): Pt will complete toilet transfer MIN A using LRAD consistently OT Short Term Goal 3 (Week 1): Pt will complete LB dressing CGA using LRAD OT Short Term Goal 4 (Week 1): Pt will complete toileting with CGA using LRAD OT Short Term Goal 5 (Week 1): Pt will utilize L UE as a gross assist during ADLs with SUP  Skilled Therapeutic Interventions/Progress Updates:   Patient received supine in bed, agreeable to shower.  Patient moves very quickly, but was not unsafe this session.  Patient walks with ataxic gait.  Patient report legs feel different with this stroke.  Discharge planning discussed - plans to return to daughter's home where he has stayed before.  Indicates there are 13 people living in this house and that daughter works from home.   Patient able to bathe himself while seated on transfer bench.   Patient dressed himself while seated edge of bed.  Patient able to stand at sink today to brush his teeth.   Transported to gym to address muscle balance in legs for sit to and from stand.  Patient needing facilitation to flex at knees and hips to transition stand to sit with controlled descent.   Patient returned to bed at end of session with bed alarm engaged and call bell in reach.      Therapy Documentation Precautions:  Precautions Precautions: Fall Precaution/Restrictions Comments: legally blind, L hemi, ataxic Restrictions Weight Bearing Restrictions Per Provider Order: No   Pain: Denies pain    Therapy/Group: Individual Therapy  Shukri Nistler M 11/04/2024, 12:19 PM

## 2024-11-05 LAB — GLUCOSE, CAPILLARY
Glucose-Capillary: 140 mg/dL — ABNORMAL HIGH (ref 70–99)
Glucose-Capillary: 109 mg/dL — ABNORMAL HIGH (ref 70–99)
Glucose-Capillary: 357 mg/dL — ABNORMAL HIGH (ref 70–99)
Glucose-Capillary: 285 mg/dL — ABNORMAL HIGH (ref 70–99)

## 2024-11-05 MED ORDER — ACETAMINOPHEN 325 MG PO TABS: 650.0000 mg | ORAL_TABLET | Freq: Four times a day (QID) | ORAL | Status: AC | PRN

## 2024-11-05 NOTE — Inpatient Diabetes Management (Signed)
 Inpatient Diabetes Program Recommendations  AACE/ADA: New Consensus Statement on Inpatient Glycemic Control (2015)  Target Ranges:  Prepandial:   less than 140 mg/dL      Peak postprandial:   less than 180 mg/dL (1-2 hours)      Critically ill patients:  140 - 180 mg/dL   Lab Results  Component Value Date   GLUCAP 109 (H) 11/05/2024   HGBA1C 9.6 (H) 10/25/2024    Review of Glycemic Control  Latest Reference Range & Units 11/04/24 05:46 11/04/24 12:06 11/04/24 16:23 11/04/24 21:04 11/05/24 05:50 11/05/24 11:55  Glucose-Capillary 70 - 99 mg/dL 794 (H) 818 (H) 660 (H) 190 (H) 140 (H) 109 (H)   Diabetes history: DM 2 Outpatient Diabetes medications:  Patient was not taking medications prior to hospitalization (including long acting insulin ) Current orders for Inpatient glycemic control:  Novolog  0-20 units tid with meals Lantus  43 units daily Novolog  6 units tid with meals Inpatient Diabetes Program Recommendations:    Spoke to patient at bedside.  Discussed briefly insulin  and DM management.  He states he is seeing better and does not have questions.  I reminded him of importance of taking insulin  as prescribed.  Blood sugars have been pretty well controlled in hospital.  He state he can take his insulins and will also be living with his daughter who can help him.  Patient tired and had not further questions or needs related to his DM.  Will sign off referral.   Thanks,  Randall Bullocks, RN, BC-ADM Inpatient Diabetes Coordinator Pager [phone]  (8a-5p)

## 2024-11-05 NOTE — Plan of Care (Signed)
" °  Problem: Consults Goal: RH STROKE PATIENT EDUCATION Description: See Patient Education module for education specifics  Outcome: Progressing Goal: Diabetes Guidelines if Diabetic/Glucose > 140 Description: If diabetic or lab glucose is > 140 mg/dl - Initiate Diabetes/Hyperglycemia Guidelines & Document Interventions  Outcome: Progressing   Problem: RH BOWEL ELIMINATION Goal: RH STG MANAGE BOWEL WITH ASSISTANCE Description: STG Manage Bowel with mod I Assistance. Outcome: Progressing   Problem: RH BLADDER ELIMINATION Goal: RH STG MANAGE BLADDER WITH ASSISTANCE Description: STG Manage Bladder With mod I Assistance Outcome: Progressing   Problem: RH SKIN INTEGRITY Goal: RH STG SKIN FREE OF INFECTION/BREAKDOWN Description: Manage skin free of infection with mod I assistance Outcome: Progressing   Problem: RH SAFETY Goal: RH STG ADHERE TO SAFETY PRECAUTIONS W/ASSISTANCE/DEVICE Description: STG Adhere to Safety Precautions With mod I Assistance/Device. Outcome: Progressing   Problem: RH PAIN MANAGEMENT Goal: RH STG PAIN MANAGED AT OR BELOW PT'S PAIN GOAL Description: <4 w/ prns Outcome: Progressing   Problem: RH KNOWLEDGE DEFICIT Goal: RH STG INCREASE KNOWLEDGE OF DIABETES Description: Manage knowledge deficit of diabetes with mod I assistance from daughter using educational materials provided Outcome: Progressing Goal: RH STG INCREASE KNOWLEDGE OF HYPERTENSION Description: Manage knowledge deficit of hypertension  with mod I assistance from daughter using educational materials provided Outcome: Progressing Goal: RH STG INCREASE KNOWLEGDE OF HYPERLIPIDEMIA Description: Manage knowledge deficit of hyperlipidemia with mod I assistance from daughter using educational materials provided Outcome: Progressing Goal: RH STG INCREASE KNOWLEDGE OF STROKE PROPHYLAXIS Description: Manage knowledge deficit of stroke prophylaxis  with mod I assistance from daughter using educational  materials provided Outcome: Progressing   "

## 2024-11-05 NOTE — Plan of Care (Signed)
 Goals upgraded from supervision to mod I based on progress  Problem: RH Balance Goal: LTG Patient will maintain dynamic standing balance (PT) Description: LTG:  Patient will maintain dynamic standing balance with assistance during mobility activities (PT) Flowsheets (Taken 11/05/2024 1455) LTG: Pt will maintain dynamic standing balance during mobility activities with:: Independent with assistive device    Problem: RH Bed to Chair Transfers Goal: LTG Patient will perform bed/chair transfers w/assist (PT) Description: LTG: Patient will perform bed to chair transfers with assistance (PT). Flowsheets (Taken 11/05/2024 1455) LTG: Pt will perform Bed to Chair Transfers with assistance level: Independent with assistive device    Problem: RH Car Transfers Goal: LTG Patient will perform car transfers with assist (PT) Description: LTG: Patient will perform car transfers with assistance (PT). Flowsheets (Taken 11/05/2024 1455) LTG: Pt will perform car transfers with assist:: Independent with assistive device    Problem: RH Ambulation Goal: LTG Patient will ambulate in controlled environment (PT) Description: LTG: Patient will ambulate in a controlled environment, # of feet with assistance (PT). Flowsheets (Taken 11/05/2024 1455) LTG: Pt will ambulate in controlled environ  assist needed:: Independent with assistive device LTG: Ambulation distance in controlled environment: 250' Goal: LTG Patient will ambulate in home environment (PT) Description: LTG: Patient will ambulate in home environment, # of feet with assistance (PT). Flowsheets (Taken 11/05/2024 1455) LTG: Pt will ambulate in home environ  assist needed:: Independent with assistive device LTG: Ambulation distance in home environment: 150'   Problem: RH Stairs Goal: LTG Patient will ambulate up and down stairs w/assist (PT) Description: LTG: Patient will ambulate up and down # of stairs with assistance (PT) Flowsheets (Taken 11/05/2024  1455) LTG: Pt will ambulate up/down stairs assist needed:: Independent with assistive device LTG: Pt will  ambulate up and down number of stairs: At least 12 steps with LRAD

## 2024-11-05 NOTE — Progress Notes (Addendum)
 Patient ID: Stanley Stone, male   DOB: 09/08/1966, 58 y.o.   MRN: [id number] Met with the patient to review current medical situation, recrudesce of previous CVA with hx of HTN, HLD (LDL 129/Trig 142), DM (A1C 9.6). Reviewed medications for managing risk factors and CMM dietary modification recommendations.  Reports went to a shelter for meals; take what they serve and has access to medications. Aware of need to follow up with a specialist for right sided neck swelling. Will need assistance with Lantus  administration and blood sugar checks due to vision deficits. Patient noted he was on Lantus  before and was able to manage; dtr will be able to assist as needed. Reports insomnia but declined medication for this; on magnesium  supplement. Continue to follow along to address educational needs to facilitate preparation for discharge.

## 2024-11-05 NOTE — Plan of Care (Addendum)
 IR was requested for image guided right parotid mass FNA.   Case was reviewed by Dr. Johann, the mass is amenable for FNA.  Ordering provider notified.  Recommended scheduling the FNA to be done as outpatient, IR will attempt to get it done as inpatient but unsure if there will be availability due to holiday.    Please call IR for questions and concerns.   Zacory Fiola H Dayne Dekay PA-C 11/05/2024 10:57 AM

## 2024-11-05 NOTE — Progress Notes (Signed)
 "                                                        PROGRESS NOTE   Subjective/Complaints: Does not find that neck swelling has improved, continues on IV meropenem  and prednisone , sugars are elevated up to 300s due to prednisonde  ROS:+right sided neck swelling continues- no SOB, no sweats or chills no problem swallowing (ate 100% lunch)  Objective:   No results found.  No results for input(s): WBC, HGB, HCT, PLT in the last 72 hours.  No results for input(s): NA, K, CL, CO2, GLUCOSE, BUN, CREATININE, CALCIUM  in the last 72 hours.   Intake/Output Summary (Last 24 hours) at 11/05/2024 1014 Last data filed at 11/05/2024 0830 Gross per 24 hour  Intake 1108 ml  Output 2825 ml  Net -1717 ml        Physical Exam: Vital Signs Blood pressure (!) 148/76, pulse (!) 58, temperature 98.7 F (37.1 C), temperature source Oral, resp. rate 17, height 5' 5 (1.651 m), weight 76.3 kg, SpO2 97%.  General: No acute distress Mood and affect are appropriate Heart: Bradycardia Lungs: Clear to auscultation, breathing unlabored, no rales or wheezes Abdomen: Positive bowel sounds, soft nontender to palpation, nondistended Extremities: No clubbing, cyanosis, or edema Skin: No evidence of breakdown, no evidence of rash   ENT parotid mass on right side of neck, tender to palpation  Neuro: Aox3, LUE 3-/5, LLE 3/5, sensation intact to LT bilateral Impaired FTN left side, legally blind- stable 12/23   Assessment/Plan: 1. Functional deficits which require 3+ hours per day of interdisciplinary therapy in a comprehensive inpatient rehab setting. Physiatrist is providing close team supervision and 24 hour management of active medical problems listed below. Physiatrist and rehab team continue to assess barriers to discharge/monitor patient progress toward functional and medical goals  Care Tool:  Bathing    Body parts bathed by patient: Left arm, Right arm, Chest,  Abdomen, Front perineal area, Right upper leg, Left upper leg, Face, Right lower leg, Left lower leg, Buttocks   Body parts bathed by helper: Right arm, Buttocks, Left lower leg, Right lower leg     Bathing assist Assist Level: Set up assist     Upper Body Dressing/Undressing Upper body dressing   What is the patient wearing?: Pull over shirt    Upper body assist Assist Level: Set up assist    Lower Body Dressing/Undressing Lower body dressing      What is the patient wearing?: Pants, Underwear/pull up     Lower body assist Assist for lower body dressing: Contact Guard/Touching assist     Toileting Toileting Toileting Activity did not occur (Clothing management and hygiene only): N/A (no void or bm)  Toileting assist Assist for toileting: Moderate Assistance - Patient 50 - 74%     Transfers Chair/bed transfer  Transfers assist     Chair/bed transfer assist level: Contact Guard/Touching assist     Locomotion Ambulation   Ambulation assist      Assist level: Moderate Assistance - Patient 50 - 74% Assistive device: Walker-rolling Max distance: 45'   Walk 10 feet activity   Assist     Assist level: Moderate Assistance - Patient - 50 - 74% Assistive device: Walker-rolling   Walk 50 feet activity   Assist Walk 50 feet  with 2 turns activity did not occur: Safety/medical concerns         Walk 150 feet activity   Assist Walk 150 feet activity did not occur: Safety/medical concerns         Walk 10 feet on uneven surface  activity   Assist Walk 10 feet on uneven surfaces activity did not occur: Safety/medical concerns         Wheelchair     Assist Is the patient using a wheelchair?: Yes Type of Wheelchair: Manual      Max wheelchair distance: 10'    Wheelchair 50 feet with 2 turns activity    Assist        Assist Level: Total Assistance - Patient < 25%   Wheelchair 150 feet activity     Assist      Assist  Level: Total Assistance - Patient < 25%   Blood pressure (!) 148/76, pulse (!) 58, temperature 98.7 F (37.1 C), temperature source Oral, resp. rate 17, height 5' 5 (1.651 m), weight 76.3 kg, SpO2 97%.  Medical Problem List and Plan: 1. Functional deficits secondary to likely recrudescence of old stroke symptoms(remote left basal ganglia, left frontal and left PCA territory infarct) due to headache and hypertensive emergency             -patient may shower             -ELOS/Goals: 11/09/24             Therapy notes reviewed and ambulated well with therapy today  Continue CIR, discussed patient's progress with therapy Grounds pass ordered Vitamin D3 ordered F/u with Dr. Lorilee or Fidela in clinic within 1 month Would benefit from home health aide upon discharge   -continue Lovenox              -antiplatelet therapy: Aspirin  325 mg daily  This patient is capable of making decisions on his own behalf.  2. Right sided neck swelling 2/2 parotid mass: US  ordered, consulted ENT again who said likely cause is malignancy and patient should f/u with ENT outpatient, in mean time they recommended starting Augmentin BID in case of possible superimposed infection, as well as prednisone  20mg  daily for 7 days. Patient has an anaphylactic allergy to penicillins, meropenem  ordered after pharmacy consultation, will let ENT know patient has not had improvement and ask if abx and steroids can be stopped    Latest Ref Rng & Units 10/31/2024    5:33 AM 10/30/2024    8:23 PM 10/30/2024    2:27 AM  CBC  WBC 4.0 - 10.5 K/uL 9.0  9.5  8.6   Hemoglobin 13.0 - 17.0 g/dL 85.3  85.4  83.4   Hematocrit 39.0 - 52.0 % 42.7  43.2  48.8   Platelets 150 - 400 K/uL 208  209  200    Would not check CBC unless febrile as pt is on steroids now  3. Depression: continue Zoloft  50 mg daily              4.  Hypertension: d/c norvasc . Coreg  25 mg twice daily.  Monitor with increased mobility Vitals:   11/04/24 2102 11/05/24  0515  BP: 135/80 (!) 148/76  Pulse: 64 (!) 58  Resp: 18 17  Temp: 98.3 F (36.8 C) 98.7 F (37.1 C)  SpO2: 95% 97%   BP rising due to steroids, asked ENT if these can be stopped  5. Pain from right sided parotid mass: tramadol  ordered  10.  Hyperlipidemia.  Lipitor    11.  Diabetes mellitus.  Hemoglobin A1c 9.6.  increase Lantus  to 43U daily, NovoLog  6 units 3 times daily, magnesium  supplement started, CBGs rising due to steroids, asked ENT if these can be stopped  CBG (last 3)  Recent Labs    11/04/24 1623 11/04/24 2104 11/05/24 0550  GLUCAP 339* 190* 140*    12.  History of tobacco alcohol and polysubstance abuse.  Urine drug screen negative: Wean Nicoderm patch to 14mcg. Provide counseling   13.  GERD: continue Protonix    14.  Medical noncompliance.  Counseling   15.  Constipation: LBM 12/19, sorbitol  ordered 12/21, decrease Norco to q12H prn  LOS: 6 days A FACE TO FACE EVALUATION WAS PERFORMED  Flynn Lininger P Clorinda Wyble 11/05/2024, 10:14 AM     "

## 2024-11-05 NOTE — Progress Notes (Signed)
 Occupational Therapy Session Note  Patient Details  Name: Stanley Stone MRN: [id number] Date of Birth: 14-Dec-1965  Today's Date: 11/05/2024 OT Individual Time: 1105-1200 OT Individual Time Calculation (min): 55 min    Short Term Goals: Week 1:  OT Short Term Goal 1 (Week 1): Pt will tolerate standing using LRAD for >2 min during ADLs for functional activity OT Short Term Goal 2 (Week 1): Pt will complete toilet transfer MIN A using LRAD consistently OT Short Term Goal 3 (Week 1): Pt will complete LB dressing CGA using LRAD OT Short Term Goal 4 (Week 1): Pt will complete toileting with CGA using LRAD OT Short Term Goal 5 (Week 1): Pt will utilize L UE as a gross assist during ADLs with SUP  Skilled Therapeutic Interventions/Progress Updates:  Pt greeted sitting in recliner, no reports of pain. Pt ambulates from room<>day room with CGA-supervision + rollator, continues to demo's ataxic gait but no true LOB. At table top, pt instructed in series of FMC/coordination activities. Activities upgraded to include retrieval from rotating surface and yellow resistive theraputty. Pt then instructed in series of BUE strengthening, details below: 3x10 sec holds of over head position superset with x10 reps of overhead press (2# dowel bar)  3x10 sec hold of chest press position superset with x10 reps of chest press (2# dowel bar) 3x15 bicep curls with 6# medicine ball  Pt remained sitting in recliner with all immediate needs met and call bell within reach.   Therapy Documentation Precautions:  Precautions Precautions: Fall Precaution/Restrictions Comments: legally blind, L hemi, ataxic Restrictions Weight Bearing Restrictions Per Provider Order: No   Therapy/Group: Individual Therapy  Nereida Habermann, OTR/L, MSOT  11/05/2024, 6:35 AM

## 2024-11-05 NOTE — Progress Notes (Signed)
 Occupational Therapy Session Note  Patient Details  Name: Stanley Stone MRN: [id number] Date of Birth: [birth year]  Today's Date: 11/05/2024 OT Individual Time: 9164-9069 OT Individual Time Calculation (min): 55 min    Short Term Goals: Week 1:  OT Short Term Goal 1 (Week 1): Pt will tolerate standing using LRAD for >2 min during ADLs for functional activity OT Short Term Goal 2 (Week 1): Pt will complete toilet transfer MIN A using LRAD consistently OT Short Term Goal 3 (Week 1): Pt will complete LB dressing CGA using LRAD OT Short Term Goal 4 (Week 1): Pt will complete toileting with CGA using LRAD OT Short Term Goal 5 (Week 1): Pt will utilize L UE as a gross assist during ADLs with SUP  Skilled Therapeutic Interventions/Progress Updates:  Skilled OT intervention completed with focus on DC planning, functional mobility, tub transfers. Pt received supine in bed, initially not agreeable to session due to fatigue and early morning arrival but agreeable once focus shifted to DC planning. No pain reported.  Transitioned EOB with supervision. Donned shoes set up A. Requested to use rollator due to preference. Pt ambulated 200 ft using rollator x2 trials from room <> ADL apartment with CGA. Pt presents with trunk/BLE ataxia but does slightly improve with repetition. No LOB, but does endorse that Stanley feet move quicker than Stanley brain. Discussed ways to increase Stanley balance and coordination but minimal carryover during session.  Demonstrated tub transfer with TTB at ambulatory level. Pt was able to return demo with supervision with min cues for positioning. Discussed shower safety, fall prevention, shower curtain management and ways to increase independence. Pt open to TTB at [facility]. CSW notified.   Pt completed the following intervals on nustep to promote global/cardiovascular endurance needed for independence with BADLs and functional mobility: -10 mins, level 5  Discussed  follow up therapy services with pt's transportation challenges. Would benefit from follow up OT, however not receptive to [facility] and questionable with OPOT. Ambulated > room. Transitioned back to bed despite recommendation to sit up, without assist. Pt remained supine in bed, with bed alarm on/activated, and with all needs in reach at end of session.   Therapy Documentation Precautions:  Precautions Precautions: Fall Precaution/Restrictions Comments: legally blind, L hemi, ataxic Restrictions Weight Bearing Restrictions Per Provider Order: No    Therapy/Group: Individual Therapy  Mirriam Vadala E Thurley Francesconi, MS, OTR/L  11/05/2024, 10:00 AM

## 2024-11-05 NOTE — Progress Notes (Signed)
 Physical Therapy Session Note  Patient Details  Name: Stanley Stone MRN: [id number] Date of Birth: 1965-12-03  Today's Date: 11/05/2024 PT Individual Time: 1000-1055 + 1440-1520 PT Individual Time Calculation (min): 55 min  + 40 min  Short Term Goals: Week 1:  PT Short Term Goal 1 (Week 1): Pt will perform bed mobility with S PT Short Term Goal 2 (Week 1): Pt will perform transfers with CGA PT Short Term Goal 3 (Week 1): Pt will perform gait x 50' with CGA PT Short Term Goal 4 (Week 1): Pt will perform stair negotatiation with CGA  Skilled Therapeutic Interventions/Progress Updates:      1st session: Pt in bed to start - reports his eyes are tired. He reports he didn't sleep at all last night due to late day nap. Pt requesting coffee which was provided for him.   Bed mobility completed mod I. Donned slippers at EOB without assist. Sit<>stand to rollator with CGA with slight ataxia in the trunk. Ambulates with supervision and rollator from his room to day room gym, ~125'. Gait continues to be ataxic but no LOB observed.   Pt assisted onto Treadmill without BWS or harness system, minA for safely stepping on/off treadmill.  -1.0 mph, 4:08 minutes, BUE support, level surface, 349 feet. 10/10 RPE.  -0.7 mph, 6:41 minutes, BUE support, L3 incline, 409 ft, 20/10 RPE.  -0.7 mph, 5:04 minutes, BUE support, L5 incline, 312 ft, 20/10 RPE.  *seated rest breaks b/w efforts.   Pt reporting urgent need to void - ambulated to public restroom with supervision and rollator - pt completes toileting and hand washing without assist, continent of bladder.   Pt returned to his room and finished session seated in recliner. Pt aware of upcoming OT session. All needs met.  2nd session: Pt resting in bed to start - needs encouragement to participate due to his fatigue and drowsiness from poor nights rest. Pt denies any pain.  He requests to toilet before leaving his room. Bed mobility completed mod I.  Sit<>stand to rollator with supervision assist. Ambulates with supervision and rollator within his bathroom and pt continent of bladder void while standing. Unfortunately, pt had accidentally urinated on his pants so he needed to change into paper scrubs. Pt did so with setupA as he sat EOB.   Pt requesting to go outside for fresh air since he hasn't been outdoors since hospitalization. Ambulates with supervision and rollator from his room to outside near [facility] with a brief standing rest break in the elevator. While outdoors, practiced unlevel gait training and community reintegration. Pt navigated sloped paths, unfamiliar environment, and brick pavers with his rollator at supervision level. No LOB noted and distances > 500'. Discussed general DC planning, home safety, and fall prevention during rest breaks. Pt returned upstairs to CIR floor with supervision and rollator, no rest break needed, but did require directional cues to locate unit. Pt electing to return to bed which he did so at mod I level. LTG upgraded to mod I given his progress and balance improvements. Pt left with all needs met at end of treatment.       Therapy Documentation Precautions:  Precautions Precautions: Fall Precaution/Restrictions Comments: legally blind, L hemi, ataxic Restrictions Weight Bearing Restrictions Per Provider Order: No General:    Therapy/Group: Individual Therapy  Stanley Stone 11/05/2024, 7:56 AM

## 2024-11-06 ENCOUNTER — Other Ambulatory Visit: Payer: Self-pay | Admitting: Student

## 2024-11-06 LAB — MAGNESIUM: Magnesium: 2.5 mg/dL — ABNORMAL HIGH (ref 1.7–2.4)

## 2024-11-06 LAB — GLUCOSE, CAPILLARY
Glucose-Capillary: 174 mg/dL — ABNORMAL HIGH (ref 70–99)
Glucose-Capillary: 122 mg/dL — ABNORMAL HIGH (ref 70–99)
Glucose-Capillary: 227 mg/dL — ABNORMAL HIGH (ref 70–99)
Glucose-Capillary: 155 mg/dL — ABNORMAL HIGH (ref 70–99)

## 2024-11-06 LAB — CREATININE, SERUM
Creatinine, Ser: 1.19 mg/dL (ref 0.61–1.24)
GFR, Estimated: >60 mL/min

## 2024-11-06 MED ORDER — PREDNISONE 5 MG PO TABS
10.0000 mg | ORAL_TABLET | Freq: Every day | ORAL | Status: DC
  Administered 2024-11-06 – 2024-11-08 (×3): 10 mg via ORAL
  Filled 2024-11-06 (×3): qty 2

## 2024-11-06 MED ORDER — VITAMIN D 25 MCG (1000 UNIT) PO TABS
2000.0000 [IU] | ORAL_TABLET | Freq: Every day | ORAL | Status: DC
  Administered 2024-11-07 – 2024-11-09 (×3): 2000 [IU] via ORAL
  Filled 2024-11-06 (×3): qty 2

## 2024-11-06 MED ORDER — INSULIN GLARGINE 100 UNIT/ML ~~LOC~~ SOLN
44.0000 [IU] | Freq: Every day | SUBCUTANEOUS | Status: DC
  Administered 2024-11-07: 44 [IU] via SUBCUTANEOUS
  Filled 2024-11-06: qty 0.44

## 2024-11-06 MED ORDER — MAGNESIUM OXIDE -MG SUPPLEMENT 400 (240 MG) MG PO TABS: 400.0000 mg | ORAL_TABLET | Freq: Every day | ORAL | Status: DC

## 2024-11-06 MED ORDER — HYDROCODONE-ACETAMINOPHEN 5-325 MG PO TABS: 1.0000 | ORAL_TABLET | Freq: Every day | ORAL | Status: DC | PRN

## 2024-11-06 MED ORDER — NICOTINE 7 MG/24HR TD PT24
7.0000 mg | MEDICATED_PATCH | Freq: Every day | TRANSDERMAL | Status: DC
  Administered 2024-11-07: 7 mg via TRANSDERMAL
  Filled 2024-11-06: qty 1

## 2024-11-06 NOTE — Progress Notes (Signed)
 "                                                        PROGRESS NOTE   Subjective/Complaints: Feels jaw pain is worse today, discussed this may be because we stopped prednisone  yesterday since he did not feel it was helping, discussed restarting it at a lower dose today  ROS:+right sided neck swelling and pain continues- no SOB, no sweats or chills no problem swallowing  Objective:   No results found.  No results for input(s): WBC, HGB, HCT, PLT in the last 72 hours.  Recent Labs    11/06/24 0431  CREATININE 1.19     Intake/Output Summary (Last 24 hours) at 11/06/2024 1051 Last data filed at 11/06/2024 0958 Gross per 24 hour  Intake 815 ml  Output 2175 ml  Net -1360 ml        Physical Exam: Vital Signs Blood pressure (!) 142/87, pulse (!) 55, temperature 97.7 F (36.5 C), temperature source Oral, resp. rate 18, height 5' 5 (1.651 m), weight 76.3 kg, SpO2 96%.  General: No acute distress Mood and affect are appropriate Heart: Bradycardia Lungs: Clear to auscultation, breathing unlabored, no rales or wheezes Abdomen: Positive bowel sounds, soft nontender to palpation, nondistended Extremities: No clubbing, cyanosis, or edema Skin: No evidence of breakdown, no evidence of rash   ENT parotid mass on right side of neck, tender to palpation  Neuro: Aox3, LUE 3/5, LLE 3/5, sensation intact to LT bilateral Impaired FTN left side, legally blind- improved 12/24   Assessment/Plan: 1. Functional deficits which require 3+ hours per day of interdisciplinary therapy in a comprehensive inpatient rehab setting. Physiatrist is providing close team supervision and 24 hour management of active medical problems listed below. Physiatrist and rehab team continue to assess barriers to discharge/monitor patient progress toward functional and medical goals  Care Tool:  Bathing    Body parts bathed by patient: Left arm, Right arm, Chest, Abdomen, Front perineal area, Right  upper leg, Left upper leg, Face, Right lower leg, Left lower leg, Buttocks   Body parts bathed by helper: Right arm, Buttocks, Left lower leg, Right lower leg     Bathing assist Assist Level: Set up assist     Upper Body Dressing/Undressing Upper body dressing   What is the patient wearing?: Pull over shirt    Upper body assist Assist Level: Set up assist    Lower Body Dressing/Undressing Lower body dressing      What is the patient wearing?: Pants, Underwear/pull up     Lower body assist Assist for lower body dressing: Contact Guard/Touching assist     Toileting Toileting Toileting Activity did not occur (Clothing management and hygiene only): N/A (no void or bm)  Toileting assist Assist for toileting: Moderate Assistance - Patient 50 - 74%     Transfers Chair/bed transfer  Transfers assist     Chair/bed transfer assist level: Contact Guard/Touching assist     Locomotion Ambulation   Ambulation assist      Assist level: Moderate Assistance - Patient 50 - 74% Assistive device: Walker-rolling Max distance: 45'   Walk 10 feet activity   Assist     Assist level: Moderate Assistance - Patient - 50 - 74% Assistive device: Walker-rolling   Walk 50 feet activity   Assist Walk  50 feet with 2 turns activity did not occur: Safety/medical concerns         Walk 150 feet activity   Assist Walk 150 feet activity did not occur: Safety/medical concerns         Walk 10 feet on uneven surface  activity   Assist Walk 10 feet on uneven surfaces activity did not occur: Safety/medical concerns         Wheelchair     Assist Is the patient using a wheelchair?: Yes Type of Wheelchair: Manual      Max wheelchair distance: 10'    Wheelchair 50 feet with 2 turns activity    Assist        Assist Level: Total Assistance - Patient < 25%   Wheelchair 150 feet activity     Assist      Assist Level: Total Assistance - Patient <  25%   Blood pressure (!) 142/87, pulse (!) 55, temperature 97.7 F (36.5 C), temperature source Oral, resp. rate 18, height 5' 5 (1.651 m), weight 76.3 kg, SpO2 96%.  Medical Problem List and Plan: 1. Functional deficits secondary to likely recrudescence of old stroke symptoms(remote left basal ganglia, left frontal and left PCA territory infarct) due to headache and hypertensive emergency             -patient may shower             -ELOS/Goals: 11/09/24             Therapy notes reviewed and ambulated well with therapy today  Continue CIR, discussed patient's progress with therapy Grounds pass ordered Vitamin D3 ordered F/u with Dr. Lorilee or Fidela in clinic within 1 month Would benefit from home health aide upon discharge   -continue Lovenox              -antiplatelet therapy: Aspirin  325 mg daily  This patient is capable of making decisions on his own behalf.  2. Right sided neck swelling 2/2 parotid mass: US  ordered, consulted ENT again who said likely cause is malignancy and patient should f/u with ENT outpatient, in mean time they recommended starting Augmentin BID in case of possible superimposed infection, as well as prednisone  20mg  daily for 7 days. Patient has an anaphylactic allergy to penicillins, meropenem  ordered after pharmacy consultation, will let ENT know patient has not had improvement and ask if abx and steroids can be stopped    Latest Ref Rng & Units 10/31/2024    5:33 AM 10/30/2024    8:23 PM 10/30/2024    2:27 AM  CBC  WBC 4.0 - 10.5 K/uL 9.0  9.5  8.6   Hemoglobin 13.0 - 17.0 g/dL 85.3  85.4  83.4   Hematocrit 39.0 - 52.0 % 42.7  43.2  48.8   Platelets 150 - 400 K/uL 208  209  200    Would not check CBC unless febrile as pt is on steroids now  3. Depression: continue Zoloft  50 mg daily              4.  Hypertension: d/c norvasc . Coreg  25 mg twice daily.  Monitor with increased mobility Vitals:   11/05/24 2002 11/06/24 0354  BP: (!) 142/81 (!) 142/87   Pulse: 66 (!) 55  Resp: 18 18  Temp: 98.3 F (36.8 C) 97.7 F (36.5 C)  SpO2: 96% 96%   BP rising due to steroids, asked ENT if these can be stopped  5. Pain from right sided parotid mass: tramadol   ordered   10.  Hyperlipidemia.  Lipitor    11.  Diabetes mellitus.  Hemoglobin A1c 9.6.  increase Lantus  to 44U daily, NovoLog  6 units 3 times daily, magnesium  supplement started, CBGs rising due to steroids, asked ENT if these can be stopped  CBG (last 3)  Recent Labs    11/05/24 1643 11/05/24 2058 11/06/24 0549  GLUCAP 357* 285* 174*    12.  History of tobacco alcohol and polysubstance abuse.  Urine drug screen negative: Wean Nicoderm patch to 7mcg. Provide counseling   13.  GERD: continue Protonix    14.  Medical noncompliance.  Counseling   15.  Constipation: LBM 12/22, decrease norco to 1 tab daily prn  LOS: 7 days A FACE TO FACE EVALUATION WAS PERFORMED  Sven SQUIBB Johngabriel Verde 11/06/2024, 10:51 AM     "

## 2024-11-06 NOTE — Progress Notes (Signed)
 Occupational Therapy Note  Patient Details  Name: Stanley Stone MRN: [id number] Date of Birth: 11/22/1965   Occupational Therapist participated in the interdisciplinary team conference, providing clinical information regarding the patient's current status, treatment goals, and weekly focus, including any barriers that need to be addressed. Please see the Inpatient Rehabilitation Team Conference and Plan of Care Update for further details.    Belvie Iribe M 11/06/2024, 10:54 AM

## 2024-11-06 NOTE — Progress Notes (Signed)
 Patient ID: Stanley Stone, male   DOB: Jan 06, 1966, 58 y.o.   MRN: [id number]  Have reviewed team conference with pt and family. Both aware and agreeable with targeted d/c date of 12/27 and goals of Independent with assistive device.   Will go OP for therapy PT/OT at [facility]   Ordered TTB and rollator via [employer] and should be delivered prior to discharge.   His daughter will provide transportation home.

## 2024-11-06 NOTE — Progress Notes (Signed)
 Occupational Therapy Session Note  Patient Details  Name: Stanley Stone MRN: [id number] Date of Birth: 1966/05/19  Today's Date: 11/06/2024 OT Individual Time: 9054-8958 OT Individual Time Calculation (min): 56 min    Short Term Goals: Week 1:  OT Short Term Goal 1 (Week 1): Pt will tolerate standing using LRAD for >2 min during ADLs for functional activity OT Short Term Goal 2 (Week 1): Pt will complete toilet transfer MIN A using LRAD consistently OT Short Term Goal 3 (Week 1): Pt will complete LB dressing CGA using LRAD OT Short Term Goal 4 (Week 1): Pt will complete toileting with CGA using LRAD OT Short Term Goal 5 (Week 1): Pt will utilize L UE as a gross assist during ADLs with SUP  Skilled Therapeutic Interventions/Progress Updates:   Patient received seated in recliner.  Patient declines shower now but willing to shower in pm session.  Patient declined need for toilet having just used urinal. Patient had just finished PT - did a lot of walking.  Walked to gym with supervision - approaching distant supervision.  Worked on sit to stand and stand to sit without use of arms.  Worked on strengthening legs - squat to stand squat to sit.   Discussed vision resources.  Patient declining information regarding services for the blind in [facility].  Patient was an avid reader - discussed audio books - not interested.  Discussed simple modifications to allow him to be more independent in home setting - e.g Id'ing most used buttons on microwave - not interested at the this time.  It may be beneficial to talk with his daughter about potential services.   Discussed diabetes management.  Patient uses an insulin  pen for injections.  Patient aware of number of clicks to determine dosage (5 units per click according to patient.) Patient likely uses audible clicks versus reading units to determine doseage.  Patient indicates he was advised by his primary MD to take same amount of insulin  each day.    Patient was aware of symptoms of hyperglycemis - he said he has not had issue with blood sugar being too low.   Patient without clean clothing.  Washed his clothing with cueing.  Patient left up in recliner at end of session with personal items in reach.    Therapy Documentation Precautions:  Precautions Precautions: Fall Precaution/Restrictions Comments: legally blind, L hemi, ataxic Restrictions Weight Bearing Restrictions Per Provider Order: No General: General OT Amount of Missed Time: 19 Minutes   Pain: Pain Assessment Pain Scale: 0-10 Pain Score: 6  Pain Location: Jaw Pain Intervention(s): Medication (See eMAR)    Therapy/Group: Individual Therapy  Kanyla Omeara M 11/06/2024, 10:42 AM

## 2024-11-06 NOTE — Progress Notes (Signed)
 Physical Therapy Session Note  Patient Details  Name: Stanley Stone MRN: [id number] Date of Birth: 09/19/1966  Today's Date: 11/06/2024 PT Individual Time: 0815-0925 PT Individual Time Calculation (min): 70 min   Short Term Goals: Week 1:  PT Short Term Goal 1 (Week 1): Pt will perform bed mobility with S PT Short Term Goal 2 (Week 1): Pt will perform transfers with CGA PT Short Term Goal 3 (Week 1): Pt will perform gait x 50' with CGA PT Short Term Goal 4 (Week 1): Pt will perform stair negotatiation with CGA  Skilled Therapeutic Interventions/Progress Updates:   Received pt semi-reclined in bed, pt agreeable to PT treatment, and denied any pain during session. Session with emphasis on functional mobility/transfers, generalized strengthening and endurance, dynamic standing balance/coordination, NMR, and ambulation. Pt transferred semi-reclined<>sitting L EOB with HOB elevated and supervision/mod I and donned shoes with setup assist.   Pt performed all transfers with rollator and supervision throughout session. Pt ambulated 186ft with rollator and suprevision to dayroom. Pt performed seated BUE/BLE strengthening on Nustep at workload 5 for 10 minutes with emphasis on cardiovascular endurance and reciprocal movement training - total of 572 steps, 0.3 miles, 55 SPM, and 1.9 METs. Pt ambulated additional 168ft with rollator and supervision to main therapy gym and MD arrived for morning rounds.   Pt performed the following activities at staircase with CGA/close supervision for balance and emphasis on weight bearing and strengthening through LLE: -forward step ups leading with LLE and descending with RLE 2x10 on 6in step and BUE support -lateal step ups leading with LLE and descending with RLE 2x10 on 6in step and BUE support -eccentric heel raises hanging off 6in step 3x10  Ambulated 73ft with rollator and supervision to mat and performed squats with 4.4lb medicine ball 4x10 with emphasis on  quad strength. Pt then participated in 6 Min Walk Test:  Instructed patient to ambulate as quickly and as safely as possible for 6 minutes using LRAD. Patient was allowed to take standing rest breaks without stopping the test, but if the patient required a sitting rest break the clock would be stopped and the test would be over.  Results: 527 feet (160 meters, Avg speed 0.44 m/s) using a rollator with supervision. Results indicate that the patient has reduced endurance with ambulation compared to age matched norms.  Age Matched Norms: 8-59 yo M: 65 F: 12, 81-59 yo M: 60 F: 471, 84-59 yo M: 417 F: 392 MDC: 58.21 meters (190.98 feet) or 50 meters (ANPTA Core Set of Outcome Measures for Adults with Neurologic Conditions, 2018)  In parallel bars, worked on static standing balance on Rockerboard for 1 minute x 3 trials without UE support and CGA - pt with 1 LOB requiring external support to correct. Pt ambulated 1107ft with rollator and supervision back to room. Concluded session with pt sitting in recliner with all needs within reach and NT at bedside.   Therapy Documentation Precautions:  Precautions Precautions: Fall Precaution/Restrictions Comments: legally blind, L hemi, ataxic Restrictions Weight Bearing Restrictions Per Provider Order: No  Therapy/Group: Individual Therapy Therisa HERO Zaunegger Therisa Stains PT, DPT 11/06/2024, 6:55 AM

## 2024-11-06 NOTE — Patient Care Conference (Signed)
 Inpatient RehabilitationTeam Conference and Plan of Care Update Date: 11/06/2024   Time: 11:03 AM   Patient was admitted after the interdisciplinary team meeting took place on 10/30/24; as a result, the first team conference occurred on day 8 of this patient's stay.     Patient Name: Stanley Stone      Medical Record Number: [id number]  Date of Birth: 01-29-1966 Sex: Male         Room/Bed: 4W09C/4W09C-01 Payor Info: Payor: MEDICAID [place] / Plan: MEDICAID OF [place] / Product Type: *No Product type* /    Admit Date/Time:  10/30/2024  4:41 PM  Primary Diagnosis:  Infarction of left basal ganglia [facility])  Hospital Problems: Principal Problem:   Infarction of left basal ganglia [facility])    Expected Discharge Date: Expected Discharge Date: 11/09/24  Team Members Present: Physician leading conference: Dr. Sven Elks Social Worker Present: Waverly Gentry, LCSW-A Nurse Present: Barnie Ronde, RN PT Present: Therisa Stains, PT OT Present: Monica Peacock, OT     Current Status/Progress Goal Weekly Team Focus  Bowel/Bladder   Continent with B/B LBM 12/22   Will remain continent with normal pattern   Assist with toilet needs qshift/prn    Swallow/Nutrition/ Hydration               ADL's   set up/supervision   supervision/ Mod I   Increase independence with BADL, Improve functional mobility, family education, diabetes management, visual resources    Mobility   Approaching goal level of supervision. Mod I bed mobility, supervision sit<>stands with RW, ambulating > 200' with CGA/supervision and RW. Climibing stairs wtih supervision and 2 hand rails. He still has significant ataxia in BLE and trunk but is managing and compensating.   Supervision  DC planning, pt/family education, ataxia and NMR    Communication                Safety/Cognition/ Behavioral Observations               Pain   Denies pain at this time   Will be free from pain   Assess pt for pain qshift/prn     Skin   Skin is intact   Will maintain skin intergrity with no breakdown  Assess skin for breakdown and provide education to prevent breakdown.      Discharge Planning:  Plans to discharge home with daughter who works from home. Re-ordered DME - rollator via [employer]. Will have to go OP for followup therapy due to insurnace. Will await further follow-up recommendations.   Team Discussion: Patient admitted post left basal ganglia CVA with truncal ataxia and visual deficits. Limited by right parotid gland mass and jaw pain on prednisone  and prn pain medication.  Patient on target to meet rehab goals: yes, currently needs supervision for transfers. Able to ambulate > 500' using a rollator with supervision assistance.  Goals for discharge set for supervision overall.  *See Care Plan and progress notes for long and short-term goals.   Revisions to Treatment Plan:  ENT consult/IR referral for mass aspiration   Teaching Needs: Safety, medication, dietary modification, transfers, toileting, etc.    Current Barriers to Discharge: Decreased caregiver support  Possible Resolutions to Barriers: Family education OP follow up services DME: [facility]     Medical Summary Current Status: overweight, HTN, right parotid gland mass, constipaiton, CVA, type 2 diabetes  Barriers to Discharge: Medical stability  Barriers to Discharge Comments: overweight, HTN, right partod gland mass, constipation, CVA, type 2 diabetes  Possible Resolutions to [employer]: provide dietary education, decrease nicotine  patch, consulted ENT and IR, plan for FNA either inpatinet or outpatient, decrease norco to daily prn, continue aspirin , increased insulin    Continued Need for Acute Rehabilitation Level of Care: The patient requires daily medical management by a physician with specialized training in physical medicine and rehabilitation for the following reasons: Direction of a multidisciplinary physical  rehabilitation program to maximize functional independence : Yes Medical management of patient stability for increased activity during participation in an intensive rehabilitation regime.: Yes Analysis of laboratory values and/or radiology reports with any subsequent need for medication adjustment and/or medical intervention. : Yes   I attest that I was present, lead the team conference, and concur with the assessment and plan of the team.   Fredericka Sober B 11/06/2024, 1:16 PM

## 2024-11-06 NOTE — Progress Notes (Addendum)
 Occupational Therapy Weekly Progress Note  Patient Details  Name: Stanley Stone MRN: [id number] Date of Birth: 11/09/1966  Beginning of progress report period: October 31, 2024 End of progress report period: November 06, 2024  Today's Date: 11/06/2024 OT Individual Time: 8772-8672 OT Individual Time Calculation (min): 60 min    Patient has met 5 of 5 short term goals.  Patient has shown improved activity tolerance, improved balance and coordination.  Patient is showing improved functional use of LUE.    Patient continues to demonstrate the following deficits: abnormal tone, ataxia, and decreased coordination and decreased visual acuity and therefore will continue to benefit from skilled OT intervention to enhance overall performance with BADL, iADL, and Reduce care partner burden.  Patient progressing toward long term goals..  Continue plan of care.  OT Short Term Goals Week 1:  OT Short Term Goal 1 (Week 1): Pt will tolerate standing using LRAD for >2 min during ADLs for functional activity OT Short Term Goal 1 - Progress (Week 1): Met OT Short Term Goal 2 (Week 1): Pt will complete toilet transfer MIN A using LRAD consistently OT Short Term Goal 2 - Progress (Week 1): Met OT Short Term Goal 3 (Week 1): Pt will complete LB dressing CGA using LRAD OT Short Term Goal 3 - Progress (Week 1): Met OT Short Term Goal 4 (Week 1): Pt will complete toileting with CGA using LRAD OT Short Term Goal 4 - Progress (Week 1): Met OT Short Term Goal 5 (Week 1): Pt will utilize L UE as a gross assist during ADLs with SUP OT Short Term Goal 5 - Progress (Week 1): Met Week 2:  OT Short Term Goal 1 (Week 2): STG = LTG due to LOS  Skilled Therapeutic Interventions/Progress Updates:   Patient received seated in recliner finishing lunch.  Patient agreeable to OT session.  Encouraged patient to complete BADL without physical support. Patient able to gather clean clothing and safely walk with 4 wheeled  walker to toilet where he sat to remove clothing.  He walked short distances in confined bathroom space without walker safely.  Patient gathered all showering items without assistance.  Patient showered, dried and dressed himself seated without physical assistance.  Patient declined washing his hair.   Patient used rollator seat at sink to complete grooming.  Walked to laundry to retrieve clothing, fold and put away.   Walked to gym to return government social research officer.  Patient able to retrieve items from floor while seated. Patient opted to return to bed at end of session.  Bed alarm engaged and call bell/ personal items in reach.    Therapy Documentation Precautions:  Precautions Precautions: Fall Precaution/Restrictions Comments: legally blind, L hemi, ataxic Restrictions Weight Bearing Restrictions Per Provider Order: No  Pain:  4/10 neck - chronic - neoplasm     Therapy/Group: Individual Therapy  Stanley Stone M 11/06/2024, 1:28 PM

## 2024-11-07 LAB — GLUCOSE, CAPILLARY
Glucose-Capillary: 112 mg/dL — ABNORMAL HIGH (ref 70–99)
Glucose-Capillary: 128 mg/dL — ABNORMAL HIGH (ref 70–99)
Glucose-Capillary: 236 mg/dL — ABNORMAL HIGH (ref 70–99)
Glucose-Capillary: 221 mg/dL — ABNORMAL HIGH (ref 70–99)

## 2024-11-07 MED ORDER — INSULIN GLARGINE 100 UNIT/ML ~~LOC~~ SOLN
45.0000 [IU] | Freq: Every day | SUBCUTANEOUS | Status: DC
  Administered 2024-11-08: 45 [IU] via SUBCUTANEOUS
  Filled 2024-11-07: qty 0.45

## 2024-11-07 NOTE — Progress Notes (Signed)
 "                                                        PROGRESS NOTE   Subjective/Complaints: No new complaints this morning Patient's chart reviewed- No issues reported overnight Vitals signs stable   ROS:+right sided neck swelling and pain continues- no SOB, no sweats or chills no problem swallowing  Objective:   No results found.  No results for input(s): WBC, HGB, HCT, PLT in the last 72 hours.  Recent Labs    11/06/24 0431  CREATININE 1.19     Intake/Output Summary (Last 24 hours) at 11/07/2024 0929 Last data filed at 11/06/2024 2117 Gross per 24 hour  Intake 236 ml  Output 825 ml  Net -589 ml        Physical Exam: Vital Signs Blood pressure (!) 143/80, pulse (!) 58, temperature 97.7 F (36.5 C), resp. rate 18, height 5' 5 (1.651 m), weight 77.1 kg, SpO2 95%.  General: No acute distress Mood and affect are appropriate Heart: Bradycardia Lungs: Clear to auscultation, breathing unlabored, no rales or wheezes Abdomen: Positive bowel sounds, soft nontender to palpation, nondistended Extremities: No clubbing, cyanosis, or edema Skin: No evidence of breakdown, no evidence of rash   ENT parotid mass on right side of neck, tender to palpation  Neuro: Aox3, LUE 3/5, LLE 3/5, sensation intact to LT bilateral Impaired FTN left side, legally blind- stable 12/25   Assessment/Plan: 1. Functional deficits which require 3+ hours per day of interdisciplinary therapy in a comprehensive inpatient rehab setting. Physiatrist is providing close team supervision and 24 hour management of active medical problems listed below. Physiatrist and rehab team continue to assess barriers to discharge/monitor patient progress toward functional and medical goals  Care Tool:  Bathing    Body parts bathed by patient: Left arm, Right arm, Chest, Abdomen, Front perineal area, Right upper leg, Left upper leg, Face, Right lower leg, Left lower leg, Buttocks   Body parts bathed by  helper: Right arm, Buttocks, Left lower leg, Right lower leg     Bathing assist Assist Level: Set up assist     Upper Body Dressing/Undressing Upper body dressing   What is the patient wearing?: Pull over shirt    Upper body assist Assist Level: Set up assist    Lower Body Dressing/Undressing Lower body dressing      What is the patient wearing?: Pants, Underwear/pull up     Lower body assist Assist for lower body dressing: Contact Guard/Touching assist     Toileting Toileting Toileting Activity did not occur (Clothing management and hygiene only): N/A (no void or bm)  Toileting assist Assist for toileting: Moderate Assistance - Patient 50 - 74%     Transfers Chair/bed transfer  Transfers assist     Chair/bed transfer assist level: Contact Guard/Touching assist     Locomotion Ambulation   Ambulation assist      Assist level: Moderate Assistance - Patient 50 - 74% Assistive device: Walker-rolling Max distance: 45'   Walk 10 feet activity   Assist     Assist level: Moderate Assistance - Patient - 50 - 74% Assistive device: Walker-rolling   Walk 50 feet activity   Assist Walk 50 feet with 2 turns activity did not occur: Safety/medical concerns  Walk 150 feet activity   Assist Walk 150 feet activity did not occur: Safety/medical concerns         Walk 10 feet on uneven surface  activity   Assist Walk 10 feet on uneven surfaces activity did not occur: Safety/medical concerns         Wheelchair     Assist Is the patient using a wheelchair?: Yes Type of Wheelchair: Manual      Max wheelchair distance: 10'    Wheelchair 50 feet with 2 turns activity    Assist        Assist Level: Total Assistance - Patient < 25%   Wheelchair 150 feet activity     Assist      Assist Level: Total Assistance - Patient < 25%   Blood pressure (!) 143/80, pulse (!) 58, temperature 97.7 F (36.5 C), resp. rate 18, height  5' 5 (1.651 m), weight 77.1 kg, SpO2 95%.  Medical Problem List and Plan: 1. Functional deficits secondary to likely recrudescence of old stroke symptoms(remote left basal ganglia, left frontal and left PCA territory infarct) due to headache and hypertensive emergency             -patient may shower             -ELOS/Goals: 11/09/24             Therapy notes reviewed and ambulated well with therapy today  Continue CIR, discussed patient's progress with therapy Grounds pass ordered Vitamin D3 ordered F/u with Dr. Lorilee or Fidela in clinic within 1 month Would benefit from home health aide upon discharge   -continue Lovenox              -antiplatelet therapy: Aspirin  325 mg daily  This patient is capable of making decisions on his own behalf.  2. Right sided neck swelling 2/2 parotid mass: US  ordered, consulted ENT again who said likely cause is malignancy and patient should f/u with ENT outpatient, consulted IR who has will schedule outpatient FNA and will do inpatient if a spot opens up before his d/c    Latest Ref Rng & Units 10/31/2024    5:33 AM 10/30/2024    8:23 PM 10/30/2024    2:27 AM  CBC  WBC 4.0 - 10.5 K/uL 9.0  9.5  8.6   Hemoglobin 13.0 - 17.0 g/dL 85.3  85.4  83.4   Hematocrit 39.0 - 52.0 % 42.7  43.2  48.8   Platelets 150 - 400 K/uL 208  209  200    Would not check CBC unless febrile as pt is on steroids now for pain/swelling  3. Depression: continue Zoloft  50 mg daily              4.  Hypertension: d/c norvasc . Coreg  25 mg twice daily.  Monitor with increased mobility Vitals:   11/06/24 1922 11/07/24 0511  BP: 137/85 (!) 143/80  Pulse: 63 (!) 58  Resp: 18 18  Temp: 98.7 F (37.1 C) 97.7 F (36.5 C)  SpO2: 94% 95%    5. Pain from right sided parotid mass: improved with daily prednisone  10mg    10.  Hyperlipidemia: continue Lipitor    11.  Diabetes mellitus.  Hemoglobin A1c 9.6.  increase Lantus  to 45U daily, NovoLog  6 units 3 times daily, magnesium   supplement started, CBGs rising due to steroids, asked ENT if these can be stopped  CBG (last 3)  Recent Labs    11/06/24 1651 11/06/24 2118 11/07/24 0628  GLUCAP  227* 155* 112*    12.  History of tobacco alcohol and polysubstance abuse.  Urine drug screen negative: d/c nicotine  patch due to HTN. Provide counseling   13.  GERD: continue Protonix    14.  Medical noncompliance.  Counseling   15.  Constipation: LBM 12/22, d/c norco  LOS: 8 days A FACE TO FACE EVALUATION WAS PERFORMED  Sven SQUIBB Haasini Patnaude 11/07/2024, 9:29 AM     "

## 2024-11-08 ENCOUNTER — Other Ambulatory Visit (HOSPITAL_COMMUNITY): Payer: Self-pay

## 2024-11-08 LAB — GLUCOSE, CAPILLARY
Glucose-Capillary: 120 mg/dL — ABNORMAL HIGH (ref 70–99)
Glucose-Capillary: 168 mg/dL — ABNORMAL HIGH (ref 70–99)
Glucose-Capillary: 242 mg/dL — ABNORMAL HIGH (ref 70–99)
Glucose-Capillary: 195 mg/dL — ABNORMAL HIGH (ref 70–99)

## 2024-11-08 MED ORDER — SERTRALINE HCL 50 MG PO TABS
50.0000 mg | ORAL_TABLET | Freq: Every day | ORAL | 0 refills | Status: AC
  Filled 2024-11-08: qty 30, 30d supply, fill #0

## 2024-11-08 MED ORDER — INSULIN PEN NEEDLE 32G X 4 MM MISC
4.0000 | Freq: Four times a day (QID) | 1 refills | Status: AC
  Filled 2024-11-08: qty 100, 30d supply, fill #0

## 2024-11-08 MED ORDER — ALBUTEROL SULFATE HFA 108 (90 BASE) MCG/ACT IN AERS
1.0000 | INHALATION_SPRAY | RESPIRATORY_TRACT | 2 refills | Status: AC | PRN
  Filled 2024-11-08: qty 6.7, 17d supply, fill #0

## 2024-11-08 MED ORDER — ACCU-CHEK SOFTCLIX LANCETS MISC
6 refills | Status: AC
  Filled 2024-11-08: qty 100, 33d supply, fill #0

## 2024-11-08 MED ORDER — METFORMIN HCL 500 MG PO TABS
1000.0000 mg | ORAL_TABLET | Freq: Every day | ORAL | 0 refills | Status: AC
  Filled 2024-11-08: qty 60, 30d supply, fill #0

## 2024-11-08 MED ORDER — PANTOPRAZOLE SODIUM 40 MG PO TBEC
40.0000 mg | DELAYED_RELEASE_TABLET | Freq: Every day | ORAL | 0 refills | Status: AC
  Filled 2024-11-08: qty 30, 30d supply, fill #0

## 2024-11-08 MED ORDER — DAPAGLIFLOZIN PROPANEDIOL 10 MG PO TABS
10.0000 mg | ORAL_TABLET | Freq: Every day | ORAL | 0 refills | Status: AC
  Filled 2024-11-08: qty 30, 30d supply, fill #0

## 2024-11-08 MED ORDER — ATORVASTATIN CALCIUM 80 MG PO TABS
80.0000 mg | ORAL_TABLET | Freq: Every evening | ORAL | 0 refills | Status: AC
  Filled 2024-11-08: qty 30, 30d supply, fill #0

## 2024-11-08 MED ORDER — INSULIN GLARGINE 100 UNIT/ML ~~LOC~~ SOLN
46.0000 [IU] | Freq: Every day | SUBCUTANEOUS | Status: DC
  Administered 2024-11-09: 46 [IU] via SUBCUTANEOUS
  Filled 2024-11-08: qty 0.46

## 2024-11-08 MED ORDER — GLUCOSE BLOOD VI STRP
ORAL_STRIP | 6 refills | Status: AC
  Filled 2024-11-08: qty 100, 33d supply, fill #0

## 2024-11-08 MED ORDER — VITAMIN B-12 1000 MCG PO TABS
1000.0000 ug | ORAL_TABLET | Freq: Every day | ORAL | 0 refills | Status: AC
  Filled 2024-11-08: qty 30, 30d supply, fill #0

## 2024-11-08 MED ORDER — CARVEDILOL 25 MG PO TABS
25.0000 mg | ORAL_TABLET | Freq: Two times a day (BID) | ORAL | 0 refills | Status: AC
  Filled 2024-11-08: qty 60, 30d supply, fill #0

## 2024-11-08 MED ORDER — VITAMIN D3 25 MCG PO TABS
2000.0000 [IU] | ORAL_TABLET | Freq: Every day | ORAL | 0 refills | Status: AC
  Filled 2024-11-08: qty 60, 30d supply, fill #0

## 2024-11-08 MED ORDER — ACCU-CHEK GUIDE W/DEVICE KIT
PACK | 0 refills | Status: AC
  Filled 2024-11-08: qty 1, 1d supply, fill #0

## 2024-11-08 MED ORDER — FLUTICASONE-SALMETEROL 100-50 MCG/ACT IN AEPB
1.0000 | INHALATION_SPRAY | Freq: Two times a day (BID) | RESPIRATORY_TRACT | 0 refills | Status: AC
  Filled 2024-11-08: qty 60, 30d supply, fill #0

## 2024-11-08 MED ORDER — INSULIN GLARGINE 100 UNIT/ML SOLOSTAR PEN
45.0000 [IU] | PEN_INJECTOR | Freq: Every day | SUBCUTANEOUS | 0 refills | Status: AC
  Filled 2024-11-08: qty 15, 33d supply, fill #0

## 2024-11-08 NOTE — Progress Notes (Signed)
 Physical Therapy Discharge Summary  Patient Details  Name: Stanley Stone MRN: [id number] Date of Birth: 07-Nov-1966  Date of Discharge from PT service:November 08, 2024  Today's Date: 11/08/2024 PT Individual Time: 9154-9089 PT Individual Time Calculation (min): 25 min    Patient has met 8 of 8 long term goals due to improved activity tolerance, improved balance, improved postural control, increased strength, ability to compensate for deficits, functional use of  left upper extremity and left lower extremity, and improved coordination.  Patient to discharge at an ambulatory level Modified Independent.   Patient's care partner is independent to provide the necessary physical assistance at discharge.  Reasons goals not met: NA  Recommendation:  Patient will benefit from ongoing skilled PT services in outpatient setting to continue to advance safe functional mobility, address ongoing impairments in motor coordination, L hemi-weakness, balance, activity tolerance, and minimize fall risk.  Equipment: rollator  Reasons for discharge: treatment goals met and discharge from hospital  Patient/family agrees with progress made and goals achieved: Yes  PT Discharge Precautions/Restrictions Precautions Precautions: Fall Vital Signs   Pain Pain Assessment Pain Scale: 0-10 Pain Score: 0-No pain Pain Interference Pain Interference Pain Effect on Sleep: 1. Rarely or not at all Pain Interference with Therapy Activities: 1. Rarely or not at all Pain Interference with Day-to-Day Activities: 1. Rarely or not at all Vision/Perception  Vision - History Ability to See in Adequate Light: 3 Highly impaired  Cognition Overall Cognitive Status: History of cognitive impairments - at baseline Arousal/Alertness: Awake/alert Orientation Level: Oriented X4 Sensation Sensation Light Touch: Impaired Detail Light Touch Impaired Details: Impaired LLE;Impaired LUE Motor  Motor Motor:  Ataxia;Hemiplegia Motor - Discharge Observations: mild L hemi, mild ataxia in standing  Mobility Bed Mobility Bed Mobility: Rolling Right;Supine to Sit;Sit to Supine Rolling Right: Independent with assistive device Supine to Sit: Independent with assistive device Sit to Supine: Independent with assistive device Transfers Transfers: Sit to Stand;Stand to Sit;Stand Pivot Transfers Sit to Stand: Independent with assistive device Stand to Sit: Independent with assistive device Stand Pivot Transfers: Contact Guard/Touching assist Transfer (Assistive device): Rollator Locomotion  Gait Ambulation: Yes Gait Assistance: Independent with assistive device Gait Distance (Feet): 250 Feet Assistive device: Rollator Gait Gait: Yes Gait Pattern: Impaired Gait Pattern: Ataxic Stairs / Additional Locomotion Stairs: Yes Stairs Assistance: Independent with assistive device Stair Management Technique: Two rails;Step to pattern Number of Stairs: 12 Height of Stairs: 6 Ramp: Independent with assistive device Curb: Independent with assistive device Pick up small object from the floor assist level: Independent with assistive device Pick up small object from the floor assistive device: rollator Wheelchair Mobility Wheelchair Mobility: No  Trunk/Postural Assessment  Cervical Assessment Cervical Assessment: Within Functional Limits Thoracic Assessment Thoracic Assessment: Within Functional Limits Lumbar Assessment Lumbar Assessment: Within Functional Limits Postural Control Postural Control: Deficits on evaluation (mild ataxia)  Balance Balance Balance Assessed: Yes Dynamic Sitting Balance Dynamic Sitting - Balance Support: Feet unsupported Dynamic Sitting - Level of Assistance: 6: Modified independent (Device/Increase time) Dynamic Sitting - Balance Activities: Reaching for objects Static Standing Balance Static Standing - Balance Support: During functional activity;Bilateral upper  extremity supported Static Standing - Level of Assistance: 6: Modified independent (Device/Increase time) Dynamic Standing Balance Dynamic Standing - Balance Support: Bilateral upper extremity supported;During functional activity Dynamic Standing - Level of Assistance: 6: Modified independent (Device/Increase time) Extremity Assessment      RLE Assessment RLE Assessment: Within Functional Limits LLE Assessment LLE Assessment: Exceptions to [facility] General Strength Comments: gross 4/5  Chart  reviewed and pt agreeable to therapy. Pt received semi-reclined in bed. Session focused on evaluation of functional mobility for discharge. Pt initiated session with review of function as stated above.  Session education emphasized dc preparation and care continuum. At end of session, pt was left seated in recliner with alarm engaged, nurse call bell and all needs in reach.    Quince Santana G Delmer Kowalski 11/08/2024, 10:20 AM

## 2024-11-08 NOTE — Progress Notes (Signed)
 Inpatient Rehabilitation Care Coordinator Discharge Note   Patient Details  Name: Stanley Stone MRN: [id number] Date of Birth: 27-Jul-1966   Discharge location: Home with daughter  Length of Stay: 9 days  Discharge activity level: Independent with assistive device  Home/community participation: Independent with assistive device  Patient response un:Yzjouy Literacy - How often do you need to have someone help you when you read instructions, pamphlets, or other written material from your doctor or pharmacy?: Always  Patient response un:Dnrpjo Isolation - How often do you feel lonely or isolated from those around you?: Patient declines to respond  Services provided included: MD, RD, PT, OT, SLP, CM, RN, TR, Pharmacy, SW  Financial Services:  Financial Services Utilized: Medicaid    Choices offered to/list presented to: Patient  Follow-up services arranged:  Outpatient, DME    Outpatient Servicies: PT/OT DME : [facility] and Rollator    Patient response to transportation need: Is the patient able to respond to transportation needs?: Yes In the past 12 months, has lack of transportation kept you from medical appointments or from getting medications?: No In the past 12 months, has lack of transportation kept you from meetings, work, or from getting things needed for daily living?: No   Patient/Family verbalized understanding of follow-up arrangements:  Yes (May be non-compliant following dishcarge)  Individual responsible for coordination of the follow-up plan: Patient  Confirmed correct DME delivered: [employer]  Loreli 11/08/2024    Comments (or additional information): Discharging home with daughter who will also provide transportation home.   Summary of Stay    Date/Time Discharge Planning CSW  11/05/24 1035 Plans to discharge home with daughter who works from home. Re-ordered DME - rollator via [employer]. Will have to go OP for followup therapy due to insurnace. Will await  further follow-up recommendations. DS       [employer]  Loreli

## 2024-11-08 NOTE — Progress Notes (Signed)
 "                                                        PROGRESS NOTE   Subjective/Complaints: Discussed that CBGs are still elevated probably partly due to prednisone , discussed d/cing prednisone  because his jaw pain has improved  ROS:+right sided neck swelling and pain continues but is improved- no SOB, no sweats or chills no problem swallowing  Objective:   No results found.  No results for input(s): WBC, HGB, HCT, PLT in the last 72 hours.  Recent Labs    11/06/24 0431  CREATININE 1.19     Intake/Output Summary (Last 24 hours) at 11/08/2024 0912 Last data filed at 11/08/2024 0739 Gross per 24 hour  Intake 1892 ml  Output 2575 ml  Net -683 ml        Physical Exam: Vital Signs Blood pressure (!) 141/83, pulse (!) 56, temperature 98 F (36.7 C), temperature source Oral, resp. rate 16, height 5' 5 (1.651 m), weight 77.1 kg, SpO2 96%.  General: No acute distress Mood and affect are appropriate Heart: Bradycardia Lungs: Clear to auscultation, breathing unlabored, no rales or wheezes Abdomen: Positive bowel sounds, soft nontender to palpation, nondistended Extremities: No clubbing, cyanosis, or edema Skin: No evidence of breakdown, no evidence of rash   ENT parotid mass on right side of neck, tender to palpation  Neuro: Aox3, left side strength has improved to 4/5, sensation intact to LT bilateral Impaired FTN left side, legally blind   Assessment/Plan: 1. Functional deficits which require 3+ hours per day of interdisciplinary therapy in a comprehensive inpatient rehab setting. Physiatrist is providing close team supervision and 24 hour management of active medical problems listed below. Physiatrist and rehab team continue to assess barriers to discharge/monitor patient progress toward functional and medical goals  Care Tool:  Bathing    Body parts bathed by patient: Left arm, Right arm, Chest, Abdomen, Front perineal area, Right upper leg, Left upper  leg, Face, Right lower leg, Left lower leg, Buttocks   Body parts bathed by helper: Right arm, Buttocks, Left lower leg, Right lower leg     Bathing assist Assist Level: Set up assist     Upper Body Dressing/Undressing Upper body dressing   What is the patient wearing?: Pull over shirt    Upper body assist Assist Level: Set up assist    Lower Body Dressing/Undressing Lower body dressing      What is the patient wearing?: Pants, Underwear/pull up     Lower body assist Assist for lower body dressing: Contact Guard/Touching assist     Toileting Toileting Toileting Activity did not occur (Clothing management and hygiene only): N/A (no void or bm)  Toileting assist Assist for toileting: Moderate Assistance - Patient 50 - 74%     Transfers Chair/bed transfer  Transfers assist     Chair/bed transfer assist level: Independent with assistive device Chair/bed transfer assistive device: Geologist, Engineering   Ambulation assist      Assist level: Independent with assistive device Assistive device: Rollator Max distance: 250   Walk 10 feet activity   Assist     Assist level: Independent with assistive device Assistive device: Rollator   Walk 50 feet activity   Assist Walk 50 feet with 2 turns activity did not occur: Safety/medical  concerns  Assist level: Independent with assistive device Assistive device: Rollator    Walk 150 feet activity   Assist Walk 150 feet activity did not occur: Safety/medical concerns  Assist level: Independent with assistive device Assistive device: Rollator    Walk 10 feet on uneven surface  activity   Assist Walk 10 feet on uneven surfaces activity did not occur: Safety/medical concerns         Wheelchair     Assist Is the patient using a wheelchair?: No Type of Wheelchair: Manual Wheelchair activity did not occur: N/A    Max wheelchair distance: 10'    Wheelchair 50 feet with 2 turns  activity    Assist    Wheelchair 50 feet with 2 turns activity did not occur: N/A   Assist Level: Total Assistance - Patient < 25%   Wheelchair 150 feet activity     Assist  Wheelchair 150 feet activity did not occur: N/A   Assist Level: Total Assistance - Patient < 25%   Blood pressure (!) 141/83, pulse (!) 56, temperature 98 F (36.7 C), temperature source Oral, resp. rate 16, height 5' 5 (1.651 m), weight 77.1 kg, SpO2 96%.  Medical Problem List and Plan: 1. Functional deficits secondary to likely recrudescence of old stroke symptoms(remote left basal ganglia, left frontal and left PCA territory infarct) due to headache and hypertensive emergency             -patient may shower             -ELOS/Goals: 11/09/24             Therapy notes reviewed and ambulated well with therapy today  Continue CIR, discussed patient's progress with therapy Grounds pass ordered Vitamin D3 ordered F/u with Dr. Lorilee or Fidela in clinic within 1 month Would benefit from home health aide upon discharge   -continue Lovenox              -antiplatelet therapy: Aspirin  325 mg daily  This patient is capable of making decisions on his own behalf.  2. Right sided neck swelling 2/2 parotid mass: US  ordered, consulted ENT again who said likely cause is malignancy and patient should f/u with ENT outpatient, consulted IR who has will schedule outpatient FNA and will do inpatient if a spot opens up before his d/c    Latest Ref Rng & Units 10/31/2024    5:33 AM 10/30/2024    8:23 PM 10/30/2024    2:27 AM  CBC  WBC 4.0 - 10.5 K/uL 9.0  9.5  8.6   Hemoglobin 13.0 - 17.0 g/dL 85.3  85.4  83.4   Hematocrit 39.0 - 52.0 % 42.7  43.2  48.8   Platelets 150 - 400 K/uL 208  209  200    Would not check CBC unless febrile as pt is on steroids now for pain/swelling  3. Depression: continue Zoloft  50 mg daily              4.  Hypertension: d/c norvasc . Coreg  25 mg twice daily.  D/c prednisone  Vitals:    11/07/24 2015 11/08/24 0532  BP: (!) 154/84 (!) 141/83  Pulse: 60 (!) 56  Resp: 16 16  Temp: 98.4 F (36.9 C) 98 F (36.7 C)  SpO2: 93% 96%    5. Pain from right sided parotid mass: improved, d/c prednisone    10.  Hyperlipidemia: continue Lipitor    11.  Diabetes mellitus.  Hemoglobin A1c 9.6.  increase Lantus  to 46U daily, NovoLog   6 units 3 times daily, magnesium  supplement started, d/c prednisone   CBG (last 3)  Recent Labs    11/07/24 1613 11/07/24 2014 11/08/24 0533  GLUCAP 236* 221* 120*    12.  History of tobacco alcohol and polysubstance abuse.  Urine drug screen negative: d/c nicotine  patch due to HTN. Provide counseling   13.  GERD: continue Protonix    14.  Medical noncompliance.  Counseling   15.  Constipation: LBM 12/25  LOS: 9 days A FACE TO FACE EVALUATION WAS PERFORMED  Cuauhtemoc Huegel P Torry Adamczak 11/08/2024, 9:12 AM     "

## 2024-11-08 NOTE — Progress Notes (Signed)
 Occupational Therapy Discharge Summary  Patient Details  Name: Stanley Stone MRN: [id number] Date of Birth: June 25, 1966  Date of Discharge from OT service:November 08, 2024    Patient has met 9 of 9 long term goals due to improved activity tolerance, improved balance, postural control, ability to compensate for deficits, functional use of  LEFT upper and LEFT lower extremity, and improved attention.  Patient to discharge at overall Modified Independent level.  Patient's care partner is independent to provide the necessary physical assistance at discharge.    Reasons goals not met: NA  Recommendation:  Patient will benefit from ongoing skilled OT services in outpatient setting to continue to advance functional skills in the area of BADL, iADL, and Reduce care partner burden.  Equipment: Emergency planning/management officer, rollator  Reasons for discharge: treatment goals met and discharge from hospital  Patient/family agrees with progress made and goals achieved: Yes  OT Discharge Precautions/Restrictions  Precautions Precautions: Fall Recall of Precautions/Restrictions: Intact Precaution/Restrictions Comments: legally blind, L hemi, ataxic Restrictions Weight Bearing Restrictions Per Provider Order: No   Pain Pain Assessment Pain Scale: 0-10 Pain Score: 4  Pain Type: Acute pain Pain Location: Jaw Pain Orientation: Right Pain Descriptors / Indicators: Aching Pain Onset: On-going Patients Stated Pain Goal: 2 Pain Intervention(s): Shower Multiple Pain Sites: No ADL ADL Eating: Independent Where Assessed-Eating: Chair Grooming: Modified independent Where Assessed-Grooming: Standing at sink Upper Body Bathing: Modified independent Where Assessed-Upper Body Bathing: Shower Lower Body Bathing: Modified independent Where Assessed-Lower Body Bathing: Shower Upper Body Dressing: Modified independent (Device) Where Assessed-Upper Body Dressing: Chair Lower Body Dressing: Modified  independent Where Assessed-Lower Body Dressing: Sitting at sink, Standing at sink Toileting: Modified independent Where Assessed-Toileting: Teacher, Adult Education: Engineer, Agricultural Method: Proofreader: Gaffer: Modified independent Web Designer Method: Ship Broker: Insurance Underwriter: Modified independent Film/video Editor Method: Designer, Industrial/product: Sales Promotion Account Executive Baseline Vision/History: 2 Legally blind Patient Visual Report: No change from baseline Vision Assessment?: No apparent visual deficits Perception  Perception: Within Functional Limits Praxis Praxis: WFL Cognition Cognition Overall Cognitive Status: History of cognitive impairments - at baseline Arousal/Alertness: Awake/alert Orientation Level: Person;Place;Situation Person: Oriented Place: Oriented Situation: Oriented Memory: Impaired Memory Impairment: Storage deficit;Retrieval deficit Attention: Sustained;Selective Sustained Attention: Appears intact Selective Attention: Appears intact Awareness: Appears intact Problem Solving: Appears intact Safety/Judgment: Appears intact Brief Interview for Mental Status (BIMS) Repetition of Three Words (First Attempt): 3 Temporal Orientation: Year: Correct Temporal Orientation: Month: Accurate within 5 days Temporal Orientation: Day: Correct Recall: Sock: Yes, no cue required Recall: Blue: Yes, no cue required Recall: Bed: Yes, no cue required BIMS Summary Score: 15 Sensation Sensation Light Touch: Impaired Detail Light Touch Impaired Details: Impaired LLE;Impaired LUE Hot/Cold: Appears Intact Proprioception: Impaired Detail Proprioception Impaired Details: Impaired LUE;Impaired LLE Stereognosis: Not tested Motor  Motor Motor: Ataxia;Hemiplegia Motor - Discharge Observations: mild L hemi- chronic,  mild ataxia in standing Mobility  Bed Mobility Bed Mobility: Rolling Right;Supine to Sit;Sit to Supine Rolling Right: Independent with assistive device Supine to Sit: Independent with assistive device Sit to Supine: Independent with assistive device Transfers Sit to Stand: Independent with assistive device Stand to Sit: Independent with assistive device  Trunk/Postural Assessment  Cervical Assessment Cervical Assessment: Within Functional Limits Thoracic Assessment Thoracic Assessment: Within Functional Limits Lumbar Assessment Lumbar Assessment: Within Functional Limits Postural Control Postural Control: Deficits on evaluation  Balance Balance Balance Assessed: Yes Static Sitting Balance Static Sitting - Balance Support:  Feet supported;Right upper extremity supported;Left upper extremity supported Static Sitting - Level of Assistance: 6: Modified independent (Device/Increase time) Dynamic Sitting Balance Dynamic Sitting - Balance Support: Right upper extremity supported;Left upper extremity supported;Feet supported;During functional activity Dynamic Sitting - Level of Assistance: 6: Modified independent (Device/Increase time) Dynamic Sitting - Balance Activities: Reaching for objects Static Standing Balance Static Standing - Balance Support: During functional activity;Right upper extremity supported;Left upper extremity supported Static Standing - Level of Assistance: 6: Modified independent (Device/Increase time) Dynamic Standing Balance Dynamic Standing - Balance Support: Right upper extremity supported;Left upper extremity supported;During functional activity Dynamic Standing - Level of Assistance: 6: Modified independent (Device/Increase time) Extremity/Trunk Assessment RUE Assessment RUE Assessment: Within Functional Limits LUE Assessment LUE Assessment: Exceptions to [facility] General Strength Comments: 3+/5 overall; combination of residual L side weakness and new onset of  weakness following CVA; pain at shoulder during shoudler flexion   [name] 11/08/2024, 11:44 AM

## 2024-11-08 NOTE — Progress Notes (Signed)
 Occupational Therapy Session Note  Patient Details  Name: Stanley Stone MRN: [id number] Date of Birth: [birth year]  Today's Date: 11/08/2024 OT Individual Time: 8883-8843 OT Individual Time Calculation (min): 40 min    Short Term Goals: Week 1:  OT Short Term Goal 1 (Week 1): Pt will tolerate standing using LRAD for >2 min during ADLs for functional activity OT Short Term Goal 1 - Progress (Week 1): Met OT Short Term Goal 2 (Week 1): Pt will complete toilet transfer MIN A using LRAD consistently OT Short Term Goal 2 - Progress (Week 1): Met OT Short Term Goal 3 (Week 1): Pt will complete LB dressing CGA using LRAD OT Short Term Goal 3 - Progress (Week 1): Met OT Short Term Goal 4 (Week 1): Pt will complete toileting with CGA using LRAD OT Short Term Goal 4 - Progress (Week 1): Met OT Short Term Goal 5 (Week 1): Pt will utilize L UE as a gross assist during ADLs with SUP OT Short Term Goal 5 - Progress (Week 1): Met Week 2:  OT Short Term Goal 1 (Week 2): STG = LTG due to LOS  Skilled Therapeutic Interventions/Progress Updates:   Patient received seated in recliner eager for shower.  Covered patient's IV as he requested - then he completed all BADL tasks with Modified Independence.  Patient has received equipment for home - rollator and transfer tub bench.  Patient eager to get home tomorrow and feels he is very close to his baseline.   Patient made Mod I in room, and very excited to be able to walk to bathroom unassisted.  Informed NT of upgraded status.  Patient left up in chair at end of session.    Therapy Documentation Precautions:  Precautions Precautions: Fall Recall of Precautions/Restrictions: Intact Precaution/Restrictions Comments: legally blind, L hemi, ataxic Restrictions Weight Bearing Restrictions Per Provider Order: No   Pain: Pain Assessment Pain Scale: 0-10 Pain Score: 4  Pain Type: Acute pain Pain Location: Jaw Pain Orientation: Right Pain Descriptors /  Indicators: Aching Pain Onset: On-going Patients Stated Pain Goal: 2 Pain Intervention(s): Shower Multiple Pain Sites: No      Therapy/Group: Individual Therapy  Ange Puskas M 11/08/2024, 11:43 AM

## 2024-11-08 NOTE — Progress Notes (Signed)
 Physical Therapy Session Note  Patient Details  Name: Stanley Stone MRN: [id number] Date of Birth: [birth year]  Today's Date: 11/08/2024 PT Individual Time: 8695-8671; 1430 - 1455 PT Individual Time Calculation (min): 24 min; 25 min   Short Term Goals: Week 2:     SESSION 1 Skilled Therapeutic Interventions/Progress Updates: Patient sitting in recliner on entrance to room. Patient alert and agreeable to PT session.   Patient reported no pain. Session focused on increasing ability to safely navigate community distances and on non-compliant surfaces. Pt ambulated from room<>outside of [facility] in rollator modI/supervision for safety and pt demonstrating self correction when needing to increase R step clearance. Pt did not require seated rest but was provided with one after reaching outside courtyard (500'+).  Patient standing in room at end of session and modI in room with all needs within reach.  SESSION 2 Skilled Therapeutic Interventions/Progress Updates: Patient sitting in recliner on entrance to room. Patient alert and agreeable to PT session.   Patient reported no pain during session only fatigue  Therapeutic Activity: Transfers: Pt performed sit<>stand transfers throughout session with modI in rollator with pt locking brakes appropriately.  TUG(avg = 21.63s) in rollator with supervision for safety  - 21.97s  - 22.62s  - 20.30s  Five times Sit to Stand Test (FTSS) Method: Use a straight back chair with a solid seat that is 16-18 high. Ask participant to sit on the chair with arms folded across their chest.   Instructions: Stand up and sit down as quickly as possible 5 times, keeping your arms folded across your chest.   Measurement: Stop timing when the participant stands the 5th time.   TIME: 16.88 (in seconds)  - 16.86s - 15.54s - 18.25s   Times > 13.6 seconds is associated with increased disability and morbidity (Guralnik, 2000) Times > 15 seconds is  predictive of recurrent falls in healthy individuals aged 57 and older (Buatois, et al., 2008) Normal performance values in community dwelling individuals aged 24 and older (Bohannon, 2006): 60-69 years: 11.4 seconds 70-79 years: 12.6 seconds 80-89 years: 14.8 seconds   MCID: >= 2.3 seconds for Vestibular Disorders (Meretta, 2006)  Neuromuscular Re-ed: NMR facilitated during session with focus on dynamic standing balance. - 3 x 10 sit<>stand with tidal tank in B UE with cues to control descent  NMR performed for improvements in motor control and coordination, balance, sequencing, judgement, and self confidence/ efficacy in performing all aspects of mobility at highest level of independence.   Patient sitting in recliner at end of session with brakes locked, modI in room and all needs within reach.       Therapy Documentation Precautions:  Precautions Precautions: Fall Recall of Precautions/Restrictions: Intact Precaution/Restrictions Comments: legally blind, L hemi, ataxic Restrictions Weight Bearing Restrictions Per Provider Order: No  Therapy/Group: Individual Therapy  Kitti Mcclish PTA 11/08/2024, 1:32 PM

## 2024-11-08 NOTE — Progress Notes (Signed)
 Physical Therapy Session Note  Patient Details  Name: Stanley Stone MRN: [id number] Date of Birth: 07-Dec-1965  Today's Date: 11/08/2024 PT Individual Time: 1020-1045 PT Individual Time Calculation (min): 25 min   Short Term Goals: Week 1:  PT Short Term Goal 1 (Week 1): Pt will perform bed mobility with S PT Short Term Goal 2 (Week 1): Pt will perform transfers with CGA PT Short Term Goal 3 (Week 1): Pt will perform gait x 50' with CGA PT Short Term Goal 4 (Week 1): Pt will perform stair negotatiation with CGA  Skilled Therapeutic Interventions/Progress Updates: Pt presented in recliner agreeable to therapy. Pt denies pain at start of session. Pt stood with supervision and ambulated with rollator and supervision to day room. Pt participated in NuStep L5 x 12 min for overall cardiovascular conditioning. Pt used pace partner and able to maintain ~60 SPM throughout. Pt c/o mild fatigue after activity. Pt then stood with supervision and ambulated around unit demonstrating fair safety and good obstacle avoidance. Pt returned to room at end of session and returned to recliner. Pt left with seat alarm on, call bell within reach and needs met.      Therapy Documentation Precautions:  Precautions Precautions: Fall Recall of Precautions/Restrictions: Intact Precaution/Restrictions Comments: legally blind, L hemi, ataxic Restrictions Weight Bearing Restrictions Per Provider Order: No   Therapy/Group: Individual Therapy  Brookie Wayment 11/08/2024, 4:02 PM

## 2024-11-08 NOTE — Progress Notes (Signed)
 Inpatient Rehabilitation Discharge Medication Review by a Pharmacist  A complete drug regimen review was completed for this patient to identify any potential clinically significant medication issues.  High Risk Drug Classes Is patient taking? Indication by Medication  Antipsychotic No   Anticoagulant No   Antibiotic No   Opioid No   Antiplatelet Yes Asa- cva ppx  Hypoglycemics/insulin  Yes Farxiga - T2DM Insulin - T2DM Metformin - T2DM  Vasoactive Medication Yes Coreg - HTN   Chemotherapy No   Other Yes Lipitor - HLD Protonix - GERD Zoloft - MDD     Type of Medication Issue Identified Description of Issue Recommendation(s)  Drug Interaction(s) (clinically significant)     Duplicate Therapy     Allergy     No Medication Administration End Date     Incorrect Dose     Additional Drug Therapy Needed     Significant med changes from prior encounter (inform family/care partners about these prior to discharge).    Other       Clinically significant medication issues were identified that warrant physician communication and completion of prescribed/recommended actions by midnight of the next day:  No   Time spent performing this drug regimen review (minutes):  30    Emilygrace Grothe BS, PharmD, BCPS Clinical Pharmacist 11/08/2024 7:08 AM  Contact: [phone] after 3 PM

## 2024-11-09 ENCOUNTER — Other Ambulatory Visit (HOSPITAL_COMMUNITY): Payer: Self-pay

## 2024-11-09 DIAGNOSIS — I10 Essential (primary) hypertension: Secondary | ICD-10-CM

## 2024-11-09 DIAGNOSIS — R739 Hyperglycemia, unspecified: Secondary | ICD-10-CM

## 2024-11-09 DIAGNOSIS — K5901 Slow transit constipation: Secondary | ICD-10-CM

## 2024-11-09 LAB — GLUCOSE, CAPILLARY: Glucose-Capillary: 105 mg/dL — ABNORMAL HIGH (ref 70–99)

## 2024-11-09 NOTE — Progress Notes (Signed)
 "                                                        PROGRESS NOTE   Subjective/Complaints:  Pt doing well, ready to go home! Slept well, denies pain, LBM yesterday per pt, urinating fine, no other complaints or concerns.   ROS:+right sided neck swelling and pain continues but is improved- no SOB, no sweats or chills no problem swallowing Denies CP, SOB, abd pain, n/v/d/c  Objective:   No results found.  No results for input(s): WBC, HGB, HCT, PLT in the last 72 hours.  No results for input(s): NA, K, CL, CO2, GLUCOSE, BUN, CREATININE, CALCIUM  in the last 72 hours.    Intake/Output Summary (Last 24 hours) at 11/09/2024 0818 Last data filed at 11/09/2024 0740 Gross per 24 hour  Intake 700 ml  Output --  Net 700 ml        Physical Exam: Vital Signs Blood pressure 135/72, pulse 67, temperature 98.3 F (36.8 C), temperature source Oral, resp. rate 15, height 5' 5 (1.651 m), weight 77.1 kg, SpO2 96%.  General: No acute distress, resting comfortably in bed Mood and affect are appropriate Heart: RRR  no m/r/g appreciated Lungs: Clear to auscultation, breathing unlabored, no rales or wheezes Abdomen: Positive bowel sounds, soft nontender to palpation, nondistended Extremities: No clubbing, cyanosis, or edema Skin: No evidence of breakdown, no evidence of rash of exposed surfaces  PRIOR EXAMS: ENT parotid mass on right side of neck, tender to palpation  Neuro: Aox3, left side strength has improved to 4/5, sensation intact to LT bilateral Impaired FTN left side, legally blind   Assessment/Plan: 1. Functional deficits which require 3+ hours per day of interdisciplinary therapy in a comprehensive inpatient rehab setting. Physiatrist is providing close team supervision and 24 hour management of active medical problems listed below. Physiatrist and rehab team continue to assess barriers to discharge/monitor patient progress toward functional and  medical goals  Care Tool:  Bathing    Body parts bathed by patient: Left arm, Right arm, Chest, Abdomen, Front perineal area, Right upper leg, Left upper leg, Face, Right lower leg, Left lower leg, Buttocks   Body parts bathed by helper: Right arm, Buttocks, Left lower leg, Right lower leg     Bathing assist Assist Level: Independent with assistive device     Upper Body Dressing/Undressing Upper body dressing   What is the patient wearing?: Pull over shirt    Upper body assist Assist Level: Independent with assistive device    Lower Body Dressing/Undressing Lower body dressing      What is the patient wearing?: Pants, Underwear/pull up     Lower body assist Assist for lower body dressing: Independent with assitive device     Toileting Toileting Toileting Activity did not occur (Clothing management and hygiene only): N/A (no void or bm)  Toileting assist Assist for toileting: Independent with assistive device     Transfers Chair/bed transfer  Transfers assist     Chair/bed transfer assist level: Independent with assistive device Chair/bed transfer assistive device: Geologist, Engineering   Ambulation assist      Assist level: Independent with assistive device Assistive device: Rollator Max distance: 250   Walk 10 feet activity   Assist     Assist level: Independent with  assistive device Assistive device: Rollator   Walk 50 feet activity   Assist Walk 50 feet with 2 turns activity did not occur: Safety/medical concerns  Assist level: Independent with assistive device Assistive device: Rollator    Walk 150 feet activity   Assist Walk 150 feet activity did not occur: Safety/medical concerns  Assist level: Independent with assistive device Assistive device: Rollator    Walk 10 feet on uneven surface  activity   Assist Walk 10 feet on uneven surfaces activity did not occur: Safety/medical concerns          Wheelchair     Assist Is the patient using a wheelchair?: No Type of Wheelchair: Manual Wheelchair activity did not occur: N/A    Max wheelchair distance: 10'    Wheelchair 50 feet with 2 turns activity    Assist    Wheelchair 50 feet with 2 turns activity did not occur: N/A   Assist Level: Total Assistance - Patient < 25%   Wheelchair 150 feet activity     Assist  Wheelchair 150 feet activity did not occur: N/A   Assist Level: Total Assistance - Patient < 25%   Blood pressure 135/72, pulse 67, temperature 98.3 F (36.8 C), temperature source Oral, resp. rate 15, height 5' 5 (1.651 m), weight 77.1 kg, SpO2 96%.  Medical Problem List and Plan: 1. Functional deficits secondary to likely recrudescence of old stroke symptoms(remote left basal ganglia, left frontal and left PCA territory infarct) due to headache and hypertensive emergency             -patient may shower             -ELOS/Goals: 11/09/24             Therapy notes reviewed and ambulated well with therapy today  Continue CIR, discussed patient's progress with therapy Grounds pass ordered Vitamin D3 ordered F/u with Dr. Lorilee or Fidela in clinic within 1 month Would benefit from home health aide upon discharge   -11/09/24 d/c home, reviewed meds and paperwork.   -continue Lovenox              -antiplatelet therapy: Aspirin  325 mg daily  This patient is capable of making decisions on his own behalf.  2. Right sided neck swelling 2/2 parotid mass: US  ordered, consulted ENT again who said likely cause is malignancy and patient should f/u with ENT outpatient, consulted IR who has will schedule outpatient FNA and will do inpatient if a spot opens up before his d/c    Latest Ref Rng & Units 10/31/2024    5:33 AM 10/30/2024    8:23 PM 10/30/2024    2:27 AM  CBC  WBC 4.0 - 10.5 K/uL 9.0  9.5  8.6   Hemoglobin 13.0 - 17.0 g/dL 85.3  85.4  83.4   Hematocrit 39.0 - 52.0 % 42.7  43.2  48.8   Platelets  150 - 400 K/uL 208  209  200    Would not check CBC unless febrile as pt is on steroids now for pain/swelling  3. Depression: continue Zoloft  50 mg daily              4.  Hypertension: d/c norvasc . Coreg  25 mg twice daily.  D/c prednisone   -11/09/24 BPs stable, monitor outpatient Vitals:   11/08/24 1930 11/09/24 0443  BP: (!) 146/74 135/72  Pulse: 68 67  Resp: 18 15  Temp: 98.3 F (36.8 C) 98.3 F (36.8 C)  SpO2: 96% 96%  5. Pain from right sided parotid mass: improved, d/c prednisone    6.  Hyperlipidemia: continue Lipitor    7.  Diabetes mellitus.  Hemoglobin A1c 9.6.  increase Lantus  to 46U daily, NovoLog  6 units 3 times daily, magnesium  supplement started, d/c prednisone   -11/09/24 CBGs variable, will need to monitor outpatient, should improve with d/c of prednisone   CBG (last 3)  Recent Labs    11/08/24 1643 11/08/24 2031 11/09/24 0537  GLUCAP 242* 195* 105*    8.  History of tobacco alcohol and polysubstance abuse.  Urine drug screen negative: d/c nicotine  patch due to HTN. Provide counseling   9.  GERD: continue Protonix    10.  Medical noncompliance.  Counseling   11.  Constipation: LBM 12/26 per pt, not documented, monitor outpatient  LOS: 10 days A FACE TO FACE EVALUATION WAS PERFORMED  [address] 11/09/2024, 8:18 AM     "

## 2024-11-11 NOTE — Progress Notes (Signed)
 Inpatient Rehabilitation Care Coordinator Discharge Note   Patient Details  Name: Stanley Stone MRN: [id number] Date of Birth: 1966/04/10   Discharge location: Home with daughter  Length of Stay: 9 days  Discharge activity level: Independent with assistive device  Home/community participation: Independent with assistive device  Patient response un:Yzjouy Literacy - How often do you need to have someone help you when you read instructions, pamphlets, or other written material from your doctor or pharmacy?: Always  Patient response un:Dnrpjo Isolation - How often do you feel lonely or isolated from those around you?: Patient declines to respond  Services provided included: MD, RD, PT, OT, SLP, CM, RN, TR, Pharmacy, SW  Financial Services:  Financial Services Utilized: Medicaid    Choices offered to/list presented to: Patient  Follow-up services arranged:  Outpatient, DME    Outpatient Servicies: PT/OT DME : [facility] and Rollator    Patient response to transportation need: Is the patient able to respond to transportation needs?: Yes In the past 12 months, has lack of transportation kept you from medical appointments or from getting medications?: No In the past 12 months, has lack of transportation kept you from meetings, work, or from getting things needed for daily living?: No   Patient/Family verbalized understanding of follow-up arrangements:  Yes (May be non-compliant following dishcarge)  Individual responsible for coordination of the follow-up plan: Patient  Confirmed correct DME delivered: [employer]  Loreli 11/11/2024    Comments (or additional information): Patient declined family education and feels he is able to be independent when he goes home with his daughter.   Summary of Stay    Date/Time Discharge Planning CSW  11/05/24 1035 Plans to discharge home with daughter who works from home. Re-ordered DME - rollator via [employer]. Will have to go OP for followup  therapy due to insurnace. Will await further follow-up recommendations. DS       [employer]  Loreli

## 2024-12-05 NOTE — Progress Notes (Addendum)
 The patient presented for a video visit on 09/24/24 to establish care. Blood pressure screening wasn't conducted. During the appointment, the patient did not report any (SDOH).  A review of the patient's chart revealed that they do currently have a primary care provider (PCP) the last office visit was 11/22/24. There is a future appointment on 12/10/24. The pt has a case production designer, theatre/television/film, is connected with [facility], [facility] and [facility]. At this time, no additional support from the [facility] team is necessary.

## 2024-12-12 ENCOUNTER — Encounter: Attending: Registered Nurse | Admitting: Registered Nurse
